# Patient Record
Sex: Female | Born: 1950
Health system: Southern US, Community
[De-identification: ages and names within clinical notes are randomized; demographics above are authoritative.]

## PROBLEM LIST (undated history)

## (undated) DIAGNOSIS — I10 Essential (primary) hypertension: Secondary | ICD-10-CM

## (undated) DIAGNOSIS — D509 Iron deficiency anemia, unspecified: Secondary | ICD-10-CM

## (undated) DIAGNOSIS — E785 Hyperlipidemia, unspecified: Secondary | ICD-10-CM

## (undated) DIAGNOSIS — E119 Type 2 diabetes mellitus without complications: Secondary | ICD-10-CM

## (undated) HISTORY — PX: BACK SURGERY: SHX140

## (undated) HISTORY — DX: Iron deficiency anemia, unspecified: D50.9

## (undated) HISTORY — DX: Essential (primary) hypertension: I10

## (undated) HISTORY — DX: Hyperlipidemia, unspecified: E78.5

## (undated) HISTORY — DX: Type 2 diabetes mellitus without complications: E11.9

## (undated) HISTORY — PX: ABDOMINAL HYSTERECTOMY: SUR658

---

## 2005-02-08 ENCOUNTER — Ambulatory Visit (HOSPITAL_COMMUNITY): Admission: RE | Admit: 2005-02-08 | Discharge: 2005-02-09 | Payer: Self-pay | Admitting: Neurosurgery

## 2005-04-24 ENCOUNTER — Encounter: Admission: RE | Admit: 2005-04-24 | Discharge: 2005-04-24 | Payer: Self-pay | Admitting: Neurosurgery

## 2006-11-16 ENCOUNTER — Ambulatory Visit (HOSPITAL_COMMUNITY): Admission: RE | Admit: 2006-11-16 | Discharge: 2006-11-18 | Payer: Self-pay | Admitting: Orthopaedic Surgery

## 2011-04-04 ENCOUNTER — Ambulatory Visit (HOSPITAL_COMMUNITY)
Admission: RE | Admit: 2011-04-04 | Discharge: 2011-04-04 | Disposition: A | Discharge status: Home / Self Care | Payer: Medicare Other | Source: Ambulatory Visit | Attending: Orthopedic Surgery | Admitting: Orthopedic Surgery

## 2011-04-04 ENCOUNTER — Encounter (HOSPITAL_COMMUNITY)
Admission: RE | Admit: 2011-04-04 | Discharge: 2011-04-04 | Disposition: A | Discharge status: Home / Self Care | Payer: Medicare Other | Source: Ambulatory Visit | Attending: Orthopedic Surgery | Admitting: Orthopedic Surgery

## 2011-04-04 ENCOUNTER — Other Ambulatory Visit (HOSPITAL_COMMUNITY): Payer: Self-pay | Admitting: Orthopedic Surgery

## 2011-04-04 DIAGNOSIS — Z01811 Encounter for preprocedural respiratory examination: Secondary | ICD-10-CM

## 2011-04-04 DIAGNOSIS — M5020 Other cervical disc displacement, unspecified cervical region: Secondary | ICD-10-CM | POA: Insufficient documentation

## 2011-04-04 DIAGNOSIS — Z01818 Encounter for other preprocedural examination: Secondary | ICD-10-CM | POA: Insufficient documentation

## 2011-04-04 DIAGNOSIS — Z01812 Encounter for preprocedural laboratory examination: Secondary | ICD-10-CM | POA: Insufficient documentation

## 2011-04-04 LAB — COMPREHENSIVE METABOLIC PANEL
ALT: 56 U/L — ABNORMAL HIGH (ref 0–35)
AST: 37 U/L (ref 0–37)
Albumin: 4.2 g/dL (ref 3.5–5.2)
Alkaline Phosphatase: 103 U/L (ref 39–117)
BUN: 15 mg/dL (ref 6–23)
CO2: 33 mEq/L — ABNORMAL HIGH (ref 19–32)
Calcium: 10.4 mg/dL (ref 8.4–10.5)
Chloride: 99 mEq/L (ref 96–112)
Creatinine, Ser: 0.54 mg/dL (ref 0.50–1.10)
GFR calc Af Amer: >60 mL/min (ref >60)
GFR calc non Af Amer: >60 mL/min (ref >60)
Glucose, Bld: 87 mg/dL (ref 70–99)
Potassium: 3.9 mEq/L (ref 3.5–5.1)
Sodium: 142 mEq/L (ref 135–145)
Total Bilirubin: 0.4 mg/dL (ref 0.3–1.2)
Total Protein: 7.8 g/dL (ref 6.0–8.3)

## 2011-04-04 LAB — DIFFERENTIAL
Basophils Absolute: 0 10*3/uL (ref 0.0–0.1)
Basophils Relative: 0 % (ref 0–1)
Eosinophils Absolute: 0.3 10*3/uL (ref 0.0–0.7)
Eosinophils Relative: 2 % (ref 0–5)
Lymphocytes Relative: 26 % (ref 12–46)
Lymphs Abs: 4 10*3/uL (ref 0.7–4.0)
Monocytes Absolute: 1.1 10*3/uL — ABNORMAL HIGH (ref 0.1–1.0)
Monocytes Relative: 7 % (ref 3–12)
Neutro Abs: 9.8 10*3/uL — ABNORMAL HIGH (ref 1.7–7.7)
Neutrophils Relative %: 64 % (ref 43–77)

## 2011-04-04 LAB — URINALYSIS, ROUTINE W REFLEX MICROSCOPIC
Bilirubin Urine: NEGATIVE
Glucose, UA: NEGATIVE mg/dL
Hgb urine dipstick: NEGATIVE
Ketones, ur: NEGATIVE mg/dL
Leukocytes, UA: NEGATIVE
Nitrite: NEGATIVE
Protein, ur: NEGATIVE mg/dL
Specific Gravity, Urine: 1.013 (ref 1.005–1.030)
Urobilinogen, UA: 0.2 mg/dL (ref 0.0–1.0)
pH: 5.5 (ref 5.0–8.0)

## 2011-04-04 LAB — PROTIME-INR
INR: 0.98 (ref 0.00–1.49)
Prothrombin Time: 13.2 seconds (ref 11.6–15.2)

## 2011-04-04 LAB — TYPE AND SCREEN
ABO/RH(D): A POS
Antibody Screen: NEGATIVE

## 2011-04-04 LAB — CBC
HCT: 45.7 % (ref 36.0–46.0)
Hemoglobin: 15.4 g/dL — ABNORMAL HIGH (ref 12.0–15.0)
MCH: 30.7 pg (ref 26.0–34.0)
MCHC: 33.7 g/dL (ref 30.0–36.0)
MCV: 91.2 fL (ref 78.0–100.0)
Platelets: 264 10*3/uL (ref 150–400)
RBC: 5.01 MIL/uL (ref 3.87–5.11)
RDW: 14.1 % (ref 11.5–15.5)
WBC: 15.3 10*3/uL — ABNORMAL HIGH (ref 4.0–10.5)

## 2011-04-04 LAB — SURGICAL PCR SCREEN
MRSA, PCR: NEGATIVE
Staphylococcus aureus: NEGATIVE

## 2011-04-04 LAB — APTT: aPTT: 28 seconds (ref 24–37)

## 2011-04-12 ENCOUNTER — Inpatient Hospital Stay (HOSPITAL_COMMUNITY)
Admission: RE | Admit: 2011-04-12 | Discharge: 2011-04-19 | DRG: 471 | Disposition: A | Discharge status: Home Health | Payer: Medicare Other | Source: Ambulatory Visit | Attending: Orthopedic Surgery | Admitting: Orthopedic Surgery

## 2011-04-12 ENCOUNTER — Other Ambulatory Visit: Payer: Self-pay | Admitting: Orthopedic Surgery

## 2011-04-12 ENCOUNTER — Inpatient Hospital Stay (HOSPITAL_COMMUNITY): Payer: Medicare Other

## 2011-04-12 DIAGNOSIS — J189 Pneumonia, unspecified organism: Secondary | ICD-10-CM | POA: Diagnosis not present

## 2011-04-12 DIAGNOSIS — I10 Essential (primary) hypertension: Secondary | ICD-10-CM | POA: Diagnosis present

## 2011-04-12 DIAGNOSIS — Z23 Encounter for immunization: Secondary | ICD-10-CM

## 2011-04-12 DIAGNOSIS — M5020 Other cervical disc displacement, unspecified cervical region: Principal | ICD-10-CM | POA: Diagnosis present

## 2011-04-12 DIAGNOSIS — E119 Type 2 diabetes mellitus without complications: Secondary | ICD-10-CM | POA: Diagnosis present

## 2011-04-12 LAB — DIFFERENTIAL
Basophils Absolute: 0 10*3/uL (ref 0.0–0.1)
Basophils Relative: 0 % (ref 0–1)
Eosinophils Absolute: 0.3 10*3/uL (ref 0.0–0.7)
Eosinophils Relative: 2 % (ref 0–5)
Lymphocytes Relative: 28 % (ref 12–46)
Lymphs Abs: 3.3 10*3/uL (ref 0.7–4.0)
Monocytes Absolute: 0.7 10*3/uL (ref 0.1–1.0)
Monocytes Relative: 6 % (ref 3–12)
Neutro Abs: 7.3 10*3/uL (ref 1.7–7.7)
Neutrophils Relative %: 63 % (ref 43–77)

## 2011-04-12 LAB — CBC
HCT: 42.4 % (ref 36.0–46.0)
Hemoglobin: 14.4 g/dL (ref 12.0–15.0)
MCH: 30.8 pg (ref 26.0–34.0)
MCHC: 34 g/dL (ref 30.0–36.0)
MCV: 90.6 fL (ref 78.0–100.0)
Platelets: 259 10*3/uL (ref 150–400)
RBC: 4.68 MIL/uL (ref 3.87–5.11)
RDW: 13.9 % (ref 11.5–15.5)
WBC: 11.6 10*3/uL — ABNORMAL HIGH (ref 4.0–10.5)

## 2011-04-12 LAB — GLUCOSE, CAPILLARY
Glucose-Capillary: 152 mg/dL — ABNORMAL HIGH (ref 70–99)
Glucose-Capillary: 125 mg/dL — ABNORMAL HIGH (ref 70–99)

## 2011-04-13 LAB — GLUCOSE, CAPILLARY
Glucose-Capillary: 142 mg/dL — ABNORMAL HIGH (ref 70–99)
Glucose-Capillary: 168 mg/dL — ABNORMAL HIGH (ref 70–99)
Glucose-Capillary: 155 mg/dL — ABNORMAL HIGH (ref 70–99)
Glucose-Capillary: 201 mg/dL — ABNORMAL HIGH (ref 70–99)
Glucose-Capillary: 137 mg/dL — ABNORMAL HIGH (ref 70–99)

## 2011-04-13 NOTE — Op Note (Signed)
[name], [name] NO.:  [id number]  MEDICAL RECORD NO.:  [id number]  LOCATION:  3035                         FACILITY:  [facility]  PHYSICIAN:  Nelda Severe, MD      DATE OF BIRTH:  1950-10-28  DATE OF PROCEDURE:  04/12/2011 DATE OF DISCHARGE:                              OPERATIVE REPORT   SURGEON:  Nelda Severe, MD  ASSISTANT:  Lianne Cure, PA-C  PREOPERATIVE DIAGNOSES:  Central disk herniation with spinal stenosis C4- 5, status post prior C5-T7 fusion.  POSTOPERATIVE DIAGNOSES:  Central disk herniation with spinal stenosis C4-5, status post prior C5-T7 fusion.  OPERATIVE PROCEDURE:  Anterior cervical disk excision, decompression of spinal canal to dura, C4-5 interbody fusion with autogenous graft and K2M cage/plate device [facility]); harvest left anterior iliac crest autograft.  OPERATIVE NOTE:  The patient was placed under general endotracheal anesthesia.  The arms were placed, abducted at the patient's side resting on arm boards parallel to the table.  A foam bump was placed under the left buttock to raise the left iliac crest to facilitate graft harvest.  A shoulder towel was placed under the shoulders to aid in extension of the neck and head supported on foam.  A cross-table lateral radiograph of the cervical spine was taken with a spinal needle taped to the skin to aid in determining the correct level to make the incision. Prior to taking the x-ray, wrist bracelets were attached for purposes of retracting on the upper extremities to depress the shoulders during the x-ray.  Once the cross-table lateral radiograph had been taken, the needle was removed and the skin marker used to mark the level of the needle prior to removing the needle.  The left anterior iliac crest area and anterior cervical area were then prepped with DuraPrep and draped in square fashion.  The drapes were secured with Ioban.  At this point, a time-out was held at  which point the patient's identity, preoperative diagnosis, intended procedure, anticipated blood loss, length of procedure, antibiotic infusion status, and health status were all discussed/confirmed.  We then made a left-sided anterior cervical incision from the midline laterally to the anterior border of the sternocleidomastoid at approximately the C4-5 level.  There was practically no platysma identifiable.  One jugular tributary was bipolar coagulated and divided. The deep cervical fascia was bluntly dissected down onto the anterior spinal column, retracting the carotid sheath laterally and the trachea and esophagus to the right side.  The longus coli muscles were identified in the interval between them.  Cutting current was used to cut down on the upper end of the previously placed plate.  Immediately supra adjacent disk was identified and a 18 gauge needle was placed and a cross-table lateral radiograph was taken to confirm position.  The longus coli muscles were mobilized bilaterally and a shadow line retractor was placed.  We then placed a Caspar pin distally through a central hole of the plate into the body of C5 and a Caspar pin into the body of C4 proximally and applied distraction.  We then enucleated the disk using a combination of curettes and rongeurs.  We eventually used a high-speed  bur to remove some of the anterior inferior lip of the C4 vertebra to aid in exposure.  Disk material was curetted away back to the posterior margins of the vertebrae and the posterior longitudinal ligament.  There was a small rent in the posterior longitudinal ligament.  The ligament was removed.  A 2-mm Kerrison and then several fragments of degenerated nucleus material were delivered into the disk space from the spinal canal, beyond the confines of the posterior longitudinal ligament.  Once this had been done, the dura became visible.  Bilateral decompression was carried out into the  foramina and nerve hook was passed into the neural foramen on either side which detected no further compression.  Meticulous care was taken to curette all endplate cartilage away.  The disk space was sized to an 8-mm lordotic [place] implant incorporating screw fixation.  We harvested a left anterior iliac crest graft through a short incision made about 5 cm posterior to the anterior superior iliac spine and carried down onto the iliac crest using cutting current.  Retractors were placed medially and laterally to the crest.  The superior surface the crest was denuded of soft tissue.  Special trocar was used to perforate the superior surface of the crest and then a graft retaining disposable harvesting device used to remove several milliliters of bone graft.  We then placed piece of dry Gelfoam in the hole which we had created in the bone to control bleeding.  The appropriate K2M/[place] implant was packed with bone graft and gently impacted into place so that it was flushed with the anterior surface of the superior vertebra, that is C4.  Angled screws were then inserted into the vertebra above, C4 and a single screw placed distally into the C5 vertebra.  Cross-table lateral radiograph was taken showed satisfactory position of the implants.  Use of this implant precluded the necessity of removing the previously placed C5-C7 plate and screws.  A 7-gauge Silastic drain was placed in the prevertebral space and brought out through the skin to the medial end of the wound.  It was sutured in place using 3-0 nylon suture.  The subcutaneous layer and what platysma was identifiable was closed using interrupted inverted 2-0 undyed Vicryl sutures.  The skin was closed with subcuticular 3-0 undyed Vicryl in running fashion.  The skin was reinforced with Dermabond.  The bone graft harvest wound subcutaneous layer was closed using interrupted inverted 2-0 undyed Vicryl sutures.  The skin was  closed using subcuticular 3-0 undyed Vicryl in running fashion.  Skin edges were reinforced with Dermabond.  Not mentioned above is the fact that all screws were torqued with a torque screwdriver.  The dressings were then applied.  The patient transferred to bed and awakened and brought to the recovery room where she is awake and moved all limbs but is very drowsy.  There were no intraoperative complications.  The blood loss was about 100 mL secondary to some epidural bleeding which was easily controlled with FloSeal.  Sponge and needle counts were correct.  There were no intraoperative complications.     Nelda Severe, MD     MT/MEDQ  D:  04/12/2011  T:  04/13/2011  Job:  [id number]  Electronically Signed by Nelda Severe MD on 04/13/2011 02:05:39 PM

## 2011-04-14 ENCOUNTER — Inpatient Hospital Stay (HOSPITAL_COMMUNITY): Payer: Medicare Other

## 2011-04-14 LAB — DIFFERENTIAL
Eosinophils Relative: 0 % (ref 0–5)
Lymphocytes Relative: 7 % — ABNORMAL LOW (ref 12–46)
Lymphs Abs: 1.7 10*3/uL (ref 0.7–4.0)
Monocytes Relative: 4 % (ref 3–12)

## 2011-04-14 LAB — COMPREHENSIVE METABOLIC PANEL
BUN: 5 mg/dL — ABNORMAL LOW (ref 6–23)
CO2: 33 mEq/L — ABNORMAL HIGH (ref 19–32)
Chloride: 96 mEq/L (ref 96–112)
Creatinine, Ser: <0.47 mg/dL — ABNORMAL LOW (ref 0.50–1.10)
Total Bilirubin: 1 mg/dL (ref 0.3–1.2)

## 2011-04-14 LAB — GLUCOSE, CAPILLARY
Glucose-Capillary: 172 mg/dL — ABNORMAL HIGH (ref 70–99)
Glucose-Capillary: 119 mg/dL — ABNORMAL HIGH (ref 70–99)
Glucose-Capillary: 144 mg/dL — ABNORMAL HIGH (ref 70–99)

## 2011-04-14 LAB — URINALYSIS, ROUTINE W REFLEX MICROSCOPIC
Protein, ur: NEGATIVE mg/dL
Urobilinogen, UA: 0.2 mg/dL (ref 0.0–1.0)

## 2011-04-14 LAB — CBC
HCT: 40.8 % (ref 36.0–46.0)
MCV: 93.6 fL (ref 78.0–100.0)
RBC: 4.36 MIL/uL (ref 3.87–5.11)
RDW: 14 % (ref 11.5–15.5)
WBC: 24.9 10*3/uL — ABNORMAL HIGH (ref 4.0–10.5)

## 2011-04-14 LAB — URINE MICROSCOPIC-ADD ON

## 2011-04-15 LAB — DIFFERENTIAL
Basophils Absolute: 0 10*3/uL (ref 0.0–0.1)
Lymphocytes Relative: 15 % (ref 12–46)
Monocytes Absolute: 1 10*3/uL (ref 0.1–1.0)
Monocytes Relative: 5 % (ref 3–12)
Neutro Abs: 15.6 10*3/uL — ABNORMAL HIGH (ref 1.7–7.7)

## 2011-04-15 LAB — CBC
MCH: 30.4 pg (ref 26.0–34.0)
MCV: 94.4 fL (ref 78.0–100.0)
Platelets: 221 10*3/uL (ref 150–400)
RBC: 3.92 MIL/uL (ref 3.87–5.11)
RDW: 14.4 % (ref 11.5–15.5)

## 2011-04-15 LAB — GLUCOSE, CAPILLARY
Glucose-Capillary: 110 mg/dL — ABNORMAL HIGH (ref 70–99)
Glucose-Capillary: 87 mg/dL (ref 70–99)

## 2011-04-16 ENCOUNTER — Inpatient Hospital Stay (HOSPITAL_COMMUNITY): Payer: Medicare Other

## 2011-04-16 LAB — CBC
HCT: 35.2 % — ABNORMAL LOW (ref 36.0–46.0)
Hemoglobin: 11.2 g/dL — ABNORMAL LOW (ref 12.0–15.0)
MCH: 30.3 pg (ref 26.0–34.0)
MCHC: 31.8 g/dL (ref 30.0–36.0)
RBC: 3.7 MIL/uL — ABNORMAL LOW (ref 3.87–5.11)

## 2011-04-16 LAB — DIFFERENTIAL
Basophils Absolute: 0 10*3/uL (ref 0.0–0.1)
Lymphocytes Relative: 17 % (ref 12–46)
Monocytes Absolute: 0.7 10*3/uL (ref 0.1–1.0)
Monocytes Relative: 6 % (ref 3–12)
Neutro Abs: 9.1 10*3/uL — ABNORMAL HIGH (ref 1.7–7.7)
Neutrophils Relative %: 75 % (ref 43–77)

## 2011-04-16 LAB — GLUCOSE, CAPILLARY
Glucose-Capillary: 195 mg/dL — ABNORMAL HIGH (ref 70–99)
Glucose-Capillary: 142 mg/dL — ABNORMAL HIGH (ref 70–99)

## 2011-04-16 MED ORDER — IOHEXOL 300 MG/ML  SOLN: 80.0000 mL | Freq: Once | INTRAMUSCULAR | Status: AC | PRN

## 2011-04-17 LAB — GLUCOSE, CAPILLARY
Glucose-Capillary: 134 mg/dL — ABNORMAL HIGH (ref 70–99)
Glucose-Capillary: 148 mg/dL — ABNORMAL HIGH (ref 70–99)
Glucose-Capillary: 124 mg/dL — ABNORMAL HIGH (ref 70–99)
Glucose-Capillary: 187 mg/dL — ABNORMAL HIGH (ref 70–99)

## 2011-04-17 LAB — BASIC METABOLIC PANEL
CO2: 36 mEq/L — ABNORMAL HIGH (ref 19–32)
Chloride: 99 mEq/L (ref 96–112)
Glucose, Bld: 132 mg/dL — ABNORMAL HIGH (ref 70–99)
Potassium: 3.3 mEq/L — ABNORMAL LOW (ref 3.5–5.1)
Sodium: 142 mEq/L (ref 135–145)

## 2011-04-17 LAB — CBC
HCT: 36.7 % (ref 36.0–46.0)
Hemoglobin: 11.8 g/dL — ABNORMAL LOW (ref 12.0–15.0)
MCHC: 32.2 g/dL (ref 30.0–36.0)
RBC: 3.9 MIL/uL (ref 3.87–5.11)

## 2011-04-18 LAB — BASIC METABOLIC PANEL
BUN: 8 mg/dL (ref 6–23)
Calcium: 9.4 mg/dL (ref 8.4–10.5)
Chloride: 102 mEq/L (ref 96–112)
Creatinine, Ser: <0.47 mg/dL — ABNORMAL LOW (ref 0.50–1.10)

## 2011-04-18 LAB — GLUCOSE, CAPILLARY
Glucose-Capillary: 138 mg/dL — ABNORMAL HIGH (ref 70–99)
Glucose-Capillary: 183 mg/dL — ABNORMAL HIGH (ref 70–99)

## 2011-04-19 ENCOUNTER — Inpatient Hospital Stay (HOSPITAL_COMMUNITY): Payer: Medicare Other

## 2011-04-19 LAB — GLUCOSE, CAPILLARY: Glucose-Capillary: 195 mg/dL — ABNORMAL HIGH (ref 70–99)

## 2011-04-21 LAB — CULTURE, BLOOD (ROUTINE X 2)
Culture: NO GROWTH
Culture: NO GROWTH

## 2011-05-06 NOTE — Discharge Summary (Signed)
  NAMETAILEY, TOP NO.:  [id number]  MEDICAL RECORD NO.:  [id number]  LOCATION:  3035                         FACILITY:  [facility]  PHYSICIAN:  Nelda Severe, MD      DATE OF BIRTH:  23-Oct-1950  DATE OF ADMISSION:  04/12/2011 DATE OF DISCHARGE:                              DISCHARGE SUMMARY   ADDENDUM:  The patient had difficulty with pain control and painful swallowing.  I added 12 mcg fentanyl patch q.72 hour to her pain medication regimen.  Prescription was given to the patient and this is to be used q.72, a count of 5 patches with Percocet 5 for breakthrough pain.  Cepacol lozenges were also ordered for her sore throat and plan is to DC home today when the patient is stable and more alert.  She is taking p.o.'s well at bedside.  She was eating eggs slowly but independently prior to discharge today.  She may shower.  No dressing is needed at this point.  Incisions were clean dry and intact.DIAGNOSIS:  C4-5 herniated disk.     Lianne Cure, P.A.   ______________________________ Nelda Severe, MD    MC/MEDQ  D:  04/14/2011  T:  04/14/2011  Job:  [id number]  Electronically Signed by Lianne Cure P.A. on 05/02/2011 08:32:21 AM Electronically Signed by Nelda Severe MD on 05/06/2011 02:14:45 PM

## 2011-05-06 NOTE — Discharge Summary (Signed)
  NAMERAYEN, PALEN NO.:  [id number]  MEDICAL RECORD NO.:  [id number]  LOCATION:  3035                         FACILITY:  [facility]  PHYSICIAN:  Nelda Severe, MD      DATE OF BIRTH:  17-Sep-1950  DATE OF ADMISSION:  04/12/2011 DATE OF DISCHARGE:                              DISCHARGE SUMMARY   She is under the care of Dr. Nelda Severe.  FINAL DIAGNOSIS:  C4-5 herniated disk status post C5-C7 fusion.  BRIEF HISTORY OF STAY:  She was brought to [facility] on April 12, 2011.  She underwent ACDF at C4-5 with iliac crest bone graft harvest by Dr. Alveda Reasons, assistant - Lianne Cure, PA-C.  No significant findings.  Estimated blood loss 100 mL.  Grossly neurovascularly and motor intact.  Today, she is afebrile.  Vital signs were stable.  Drain was discontinued.  Incisions are clean, dry, and intact at both cervical and the left iliac.  She has grossly neurovascularly and motor intact bilateral upper and lower extremities. She is having painful swallowing.  She has been out of bed several times to go to the restroom.  No difficulties with walking per the patient. Clean, dry dressing was applied to the cervical.  Clean dry dressing is on the hip.  ASSESSMENT/FINAL DIAGNOSIS:  Herniated disk, C4-5.  PLAN:  She will walk for exercise.  No bending, stooping.  No lifting greater than 5 pounds.  She does have a soft cervical collar for comfort only, she does not have to wear the cervical collar.  I am sending her home with Percocet 5/325 one to two q.4 h. p.r.n. for pain control.  She will follow up in our office in approximately 1 week.  We will hep-lock her IV, and when she is stable today, she will be discharged home and her disposition is stable.  Diet is soft for the next 6 days and then she can progress to regular.  She will follow up in 1 week.     Lianne Cure, P.A.   ______________________________ Nelda Severe, MD    MC/MEDQ  D:   04/13/2011  T:  04/13/2011  Job:  [id number]  Electronically Signed by Lianne Cure P.A. on 05/02/2011 08:32:06 AM Electronically Signed by Nelda Severe MD on 05/06/2011 02:14:40 PM

## 2011-05-06 NOTE — Discharge Summary (Signed)
  NAMEFELMA, [name] NO.:  [id number]  MEDICAL RECORD NO.:  [id number]  LOCATION:  3035                         FACILITY:  [facility]  PHYSICIAN:  Nelda Severe, MD      DATE OF BIRTH:  February 21, 1951  DATE OF ADMISSION:  04/12/2011 DATE OF DISCHARGE:  04/19/2011                              DISCHARGE SUMMARY   ADDENDUM:  She was ready to be discharged home on April 14, 2011, and had sudden onset of weakness, lethargy, difficulty swallowing.  We did consult hospitalist who ordered discontinuation of narcotic pain medications those of Narcan for reversal.  We also ordered a chest x-ray due to bloody sputum and she was then diagnosed with right middle lobe pneumonia after review of the chest x-ray.  She was less lethargic after removing fentanyl patch and p.o. Percocet.  She was afebrile.  The patient was started on IV antibiotics.  She was followed from April 14, 2011, until today April 19, 2011, on IV antibiotics.  She made excellent recovery.  She is now able to swallow independently, taking p.o.'s, drinking fluids.  Her throat is less sore.  She is no longer spitting up bloody sputum.  We are going to be discharging her today on Avelox 400 mg p.o. daily.  She will follow up with her primary care doctor as an outpatient for followup chest x-ray.  She is stable.  Her diet is regular at this point.  DIAGNOSIS:  Cervical C4-5 herniated disk.  SECONDARY DIAGNOSIS:  Right middle lobe pneumonia.  PLAN:  At this point, she will walk for exercise.  No bending, stooping, lifting more than 5 pounds.  She will follow up in Dr. Casimiro Needle Cheyan Frees's office in 1 week.  She has been sent home on Percocet 5/325 one to two q.4 p.r.n. for pain control, and she is going to follow up with her primary care doctor in 1 week for the right middle lobe pneumonia.  If she has troubles or concerns with her neck or hip incision, she is going to call our office.  If she has  difficulties with breathing, chest pain, she is to call her medical doctor.  We will get her rolling walker and home PT, and she is to use ice on the hip or anterior cervical incision as needed.     Lianne Cure, P.A.   ______________________________ Nelda Severe, MD    MC/MEDQ  D:  04/19/2011  T:  04/19/2011  Job:  [id number]  Electronically Signed by Lianne Cure P.A. on 05/02/2011 08:32:13 AM Electronically Signed by Nelda Severe MD on 05/06/2011 02:14:43 PM

## 2011-08-01 DIAGNOSIS — A088 Other specified intestinal infections: Secondary | ICD-10-CM | POA: Diagnosis not present

## 2011-08-01 DIAGNOSIS — R112 Nausea with vomiting, unspecified: Secondary | ICD-10-CM | POA: Diagnosis not present

## 2011-08-09 DIAGNOSIS — M5430 Sciatica, unspecified side: Secondary | ICD-10-CM | POA: Diagnosis not present

## 2011-08-09 DIAGNOSIS — M999 Biomechanical lesion, unspecified: Secondary | ICD-10-CM | POA: Diagnosis not present

## 2011-08-09 DIAGNOSIS — M5137 Other intervertebral disc degeneration, lumbosacral region: Secondary | ICD-10-CM | POA: Diagnosis not present

## 2011-08-12 DIAGNOSIS — M5020 Other cervical disc displacement, unspecified cervical region: Secondary | ICD-10-CM | POA: Diagnosis not present

## 2011-08-12 DIAGNOSIS — Z981 Arthrodesis status: Secondary | ICD-10-CM | POA: Diagnosis not present

## 2011-08-13 DIAGNOSIS — R1084 Generalized abdominal pain: Secondary | ICD-10-CM | POA: Diagnosis not present

## 2011-08-13 DIAGNOSIS — R112 Nausea with vomiting, unspecified: Secondary | ICD-10-CM | POA: Diagnosis not present

## 2011-08-15 DIAGNOSIS — K7689 Other specified diseases of liver: Secondary | ICD-10-CM | POA: Diagnosis not present

## 2011-08-15 DIAGNOSIS — R52 Pain, unspecified: Secondary | ICD-10-CM | POA: Diagnosis not present

## 2011-08-15 DIAGNOSIS — R109 Unspecified abdominal pain: Secondary | ICD-10-CM | POA: Diagnosis not present

## 2011-08-15 DIAGNOSIS — R112 Nausea with vomiting, unspecified: Secondary | ICD-10-CM | POA: Diagnosis not present

## 2011-08-17 DIAGNOSIS — M542 Cervicalgia: Secondary | ICD-10-CM | POA: Diagnosis not present

## 2011-08-17 DIAGNOSIS — M961 Postlaminectomy syndrome, not elsewhere classified: Secondary | ICD-10-CM | POA: Diagnosis not present

## 2011-08-17 DIAGNOSIS — M5030 Other cervical disc degeneration, unspecified cervical region: Secondary | ICD-10-CM | POA: Diagnosis not present

## 2011-08-18 DIAGNOSIS — M5137 Other intervertebral disc degeneration, lumbosacral region: Secondary | ICD-10-CM | POA: Diagnosis not present

## 2011-08-18 DIAGNOSIS — M5430 Sciatica, unspecified side: Secondary | ICD-10-CM | POA: Diagnosis not present

## 2011-08-18 DIAGNOSIS — M999 Biomechanical lesion, unspecified: Secondary | ICD-10-CM | POA: Diagnosis not present

## 2011-08-22 ENCOUNTER — Other Ambulatory Visit: Payer: Self-pay | Admitting: Orthopaedic Surgery

## 2011-08-22 DIAGNOSIS — M542 Cervicalgia: Secondary | ICD-10-CM

## 2011-08-23 DIAGNOSIS — R1031 Right lower quadrant pain: Secondary | ICD-10-CM | POA: Diagnosis not present

## 2011-08-23 DIAGNOSIS — R112 Nausea with vomiting, unspecified: Secondary | ICD-10-CM | POA: Diagnosis not present

## 2011-08-23 DIAGNOSIS — R1013 Epigastric pain: Secondary | ICD-10-CM | POA: Diagnosis not present

## 2011-08-24 DIAGNOSIS — M5020 Other cervical disc displacement, unspecified cervical region: Secondary | ICD-10-CM | POA: Diagnosis not present

## 2011-08-24 DIAGNOSIS — M542 Cervicalgia: Secondary | ICD-10-CM | POA: Diagnosis not present

## 2011-08-25 DIAGNOSIS — R1013 Epigastric pain: Secondary | ICD-10-CM | POA: Diagnosis not present

## 2011-08-25 DIAGNOSIS — M818 Other osteoporosis without current pathological fracture: Secondary | ICD-10-CM | POA: Diagnosis not present

## 2011-08-25 DIAGNOSIS — F411 Generalized anxiety disorder: Secondary | ICD-10-CM | POA: Diagnosis not present

## 2011-08-25 DIAGNOSIS — K2990 Gastroduodenitis, unspecified, without bleeding: Secondary | ICD-10-CM | POA: Diagnosis not present

## 2011-08-25 DIAGNOSIS — E785 Hyperlipidemia, unspecified: Secondary | ICD-10-CM | POA: Diagnosis not present

## 2011-08-25 DIAGNOSIS — J309 Allergic rhinitis, unspecified: Secondary | ICD-10-CM | POA: Diagnosis not present

## 2011-08-25 DIAGNOSIS — Z79899 Other long term (current) drug therapy: Secondary | ICD-10-CM | POA: Diagnosis not present

## 2011-08-25 DIAGNOSIS — J449 Chronic obstructive pulmonary disease, unspecified: Secondary | ICD-10-CM | POA: Diagnosis not present

## 2011-08-25 DIAGNOSIS — K209 Esophagitis, unspecified without bleeding: Secondary | ICD-10-CM | POA: Diagnosis not present

## 2011-08-25 DIAGNOSIS — K219 Gastro-esophageal reflux disease without esophagitis: Secondary | ICD-10-CM | POA: Diagnosis not present

## 2011-08-25 DIAGNOSIS — K208 Other esophagitis without bleeding: Secondary | ICD-10-CM | POA: Diagnosis not present

## 2011-08-25 DIAGNOSIS — R112 Nausea with vomiting, unspecified: Secondary | ICD-10-CM | POA: Diagnosis not present

## 2011-08-25 DIAGNOSIS — Z794 Long term (current) use of insulin: Secondary | ICD-10-CM | POA: Diagnosis not present

## 2011-08-25 DIAGNOSIS — K2960 Other gastritis without bleeding: Secondary | ICD-10-CM | POA: Diagnosis not present

## 2011-08-25 DIAGNOSIS — K2970 Gastritis, unspecified, without bleeding: Secondary | ICD-10-CM | POA: Diagnosis not present

## 2011-08-25 DIAGNOSIS — E119 Type 2 diabetes mellitus without complications: Secondary | ICD-10-CM | POA: Diagnosis not present

## 2011-08-26 DIAGNOSIS — M5000 Cervical disc disorder with myelopathy, unspecified cervical region: Secondary | ICD-10-CM | POA: Diagnosis not present

## 2011-08-26 DIAGNOSIS — M542 Cervicalgia: Secondary | ICD-10-CM | POA: Diagnosis not present

## 2011-08-26 DIAGNOSIS — M5106 Intervertebral disc disorders with myelopathy, lumbar region: Secondary | ICD-10-CM | POA: Diagnosis not present

## 2011-09-01 DIAGNOSIS — M5137 Other intervertebral disc degeneration, lumbosacral region: Secondary | ICD-10-CM | POA: Diagnosis not present

## 2011-09-01 DIAGNOSIS — M5430 Sciatica, unspecified side: Secondary | ICD-10-CM | POA: Diagnosis not present

## 2011-09-01 DIAGNOSIS — M999 Biomechanical lesion, unspecified: Secondary | ICD-10-CM | POA: Diagnosis not present

## 2011-09-13 DIAGNOSIS — M5430 Sciatica, unspecified side: Secondary | ICD-10-CM | POA: Diagnosis not present

## 2011-09-13 DIAGNOSIS — M999 Biomechanical lesion, unspecified: Secondary | ICD-10-CM | POA: Diagnosis not present

## 2011-09-13 DIAGNOSIS — M5137 Other intervertebral disc degeneration, lumbosacral region: Secondary | ICD-10-CM | POA: Diagnosis not present

## 2011-09-22 DIAGNOSIS — Z79899 Other long term (current) drug therapy: Secondary | ICD-10-CM | POA: Diagnosis not present

## 2011-09-22 DIAGNOSIS — E785 Hyperlipidemia, unspecified: Secondary | ICD-10-CM | POA: Diagnosis not present

## 2011-09-22 DIAGNOSIS — IMO0001 Reserved for inherently not codable concepts without codable children: Secondary | ICD-10-CM | POA: Diagnosis not present

## 2011-09-22 DIAGNOSIS — I10 Essential (primary) hypertension: Secondary | ICD-10-CM | POA: Diagnosis not present

## 2011-09-22 DIAGNOSIS — Z6831 Body mass index (BMI) 31.0-31.9, adult: Secondary | ICD-10-CM | POA: Diagnosis not present

## 2011-09-27 DIAGNOSIS — M5430 Sciatica, unspecified side: Secondary | ICD-10-CM | POA: Diagnosis not present

## 2011-09-27 DIAGNOSIS — M999 Biomechanical lesion, unspecified: Secondary | ICD-10-CM | POA: Diagnosis not present

## 2011-09-27 DIAGNOSIS — M5137 Other intervertebral disc degeneration, lumbosacral region: Secondary | ICD-10-CM | POA: Diagnosis not present

## 2011-09-29 DIAGNOSIS — K565 Intestinal adhesions [bands], unspecified as to partial versus complete obstruction: Secondary | ICD-10-CM | POA: Diagnosis not present

## 2011-09-29 DIAGNOSIS — R1013 Epigastric pain: Secondary | ICD-10-CM | POA: Diagnosis not present

## 2011-10-03 DIAGNOSIS — R1013 Epigastric pain: Secondary | ICD-10-CM | POA: Diagnosis not present

## 2011-10-03 DIAGNOSIS — K219 Gastro-esophageal reflux disease without esophagitis: Secondary | ICD-10-CM | POA: Diagnosis not present

## 2011-10-05 DIAGNOSIS — L570 Actinic keratosis: Secondary | ICD-10-CM | POA: Diagnosis not present

## 2011-10-06 DIAGNOSIS — M5106 Intervertebral disc disorders with myelopathy, lumbar region: Secondary | ICD-10-CM | POA: Diagnosis not present

## 2011-10-06 DIAGNOSIS — M5000 Cervical disc disorder with myelopathy, unspecified cervical region: Secondary | ICD-10-CM | POA: Diagnosis not present

## 2011-10-11 DIAGNOSIS — M999 Biomechanical lesion, unspecified: Secondary | ICD-10-CM | POA: Diagnosis not present

## 2011-10-11 DIAGNOSIS — M5137 Other intervertebral disc degeneration, lumbosacral region: Secondary | ICD-10-CM | POA: Diagnosis not present

## 2011-10-11 DIAGNOSIS — M5430 Sciatica, unspecified side: Secondary | ICD-10-CM | POA: Diagnosis not present

## 2011-10-18 DIAGNOSIS — M999 Biomechanical lesion, unspecified: Secondary | ICD-10-CM | POA: Diagnosis not present

## 2011-10-18 DIAGNOSIS — M5137 Other intervertebral disc degeneration, lumbosacral region: Secondary | ICD-10-CM | POA: Diagnosis not present

## 2011-10-18 DIAGNOSIS — M5430 Sciatica, unspecified side: Secondary | ICD-10-CM | POA: Diagnosis not present

## 2011-10-26 DIAGNOSIS — J209 Acute bronchitis, unspecified: Secondary | ICD-10-CM | POA: Diagnosis not present

## 2011-10-26 DIAGNOSIS — Z6831 Body mass index (BMI) 31.0-31.9, adult: Secondary | ICD-10-CM | POA: Diagnosis not present

## 2011-10-26 DIAGNOSIS — I10 Essential (primary) hypertension: Secondary | ICD-10-CM | POA: Diagnosis not present

## 2011-11-08 DIAGNOSIS — M5137 Other intervertebral disc degeneration, lumbosacral region: Secondary | ICD-10-CM | POA: Diagnosis not present

## 2011-11-08 DIAGNOSIS — M5430 Sciatica, unspecified side: Secondary | ICD-10-CM | POA: Diagnosis not present

## 2011-11-08 DIAGNOSIS — M999 Biomechanical lesion, unspecified: Secondary | ICD-10-CM | POA: Diagnosis not present

## 2011-11-22 DIAGNOSIS — L570 Actinic keratosis: Secondary | ICD-10-CM | POA: Diagnosis not present

## 2011-11-24 DIAGNOSIS — M5430 Sciatica, unspecified side: Secondary | ICD-10-CM | POA: Diagnosis not present

## 2011-11-24 DIAGNOSIS — M999 Biomechanical lesion, unspecified: Secondary | ICD-10-CM | POA: Diagnosis not present

## 2011-11-24 DIAGNOSIS — M5137 Other intervertebral disc degeneration, lumbosacral region: Secondary | ICD-10-CM | POA: Diagnosis not present

## 2011-12-07 DIAGNOSIS — M542 Cervicalgia: Secondary | ICD-10-CM | POA: Diagnosis not present

## 2011-12-07 DIAGNOSIS — M5000 Cervical disc disorder with myelopathy, unspecified cervical region: Secondary | ICD-10-CM | POA: Diagnosis not present

## 2011-12-15 DIAGNOSIS — Z6831 Body mass index (BMI) 31.0-31.9, adult: Secondary | ICD-10-CM | POA: Diagnosis not present

## 2011-12-15 DIAGNOSIS — IMO0001 Reserved for inherently not codable concepts without codable children: Secondary | ICD-10-CM | POA: Diagnosis not present

## 2011-12-15 DIAGNOSIS — E559 Vitamin D deficiency, unspecified: Secondary | ICD-10-CM | POA: Diagnosis not present

## 2011-12-15 DIAGNOSIS — I10 Essential (primary) hypertension: Secondary | ICD-10-CM | POA: Diagnosis not present

## 2011-12-15 DIAGNOSIS — E785 Hyperlipidemia, unspecified: Secondary | ICD-10-CM | POA: Diagnosis not present

## 2011-12-20 DIAGNOSIS — M47812 Spondylosis without myelopathy or radiculopathy, cervical region: Secondary | ICD-10-CM | POA: Diagnosis not present

## 2011-12-20 DIAGNOSIS — M542 Cervicalgia: Secondary | ICD-10-CM | POA: Diagnosis not present

## 2012-01-05 DIAGNOSIS — M999 Biomechanical lesion, unspecified: Secondary | ICD-10-CM | POA: Diagnosis not present

## 2012-01-05 DIAGNOSIS — M5430 Sciatica, unspecified side: Secondary | ICD-10-CM | POA: Diagnosis not present

## 2012-01-05 DIAGNOSIS — M5137 Other intervertebral disc degeneration, lumbosacral region: Secondary | ICD-10-CM | POA: Diagnosis not present

## 2012-01-19 DIAGNOSIS — M47812 Spondylosis without myelopathy or radiculopathy, cervical region: Secondary | ICD-10-CM | POA: Diagnosis not present

## 2012-01-19 DIAGNOSIS — M542 Cervicalgia: Secondary | ICD-10-CM | POA: Diagnosis not present

## 2012-02-07 DIAGNOSIS — M5430 Sciatica, unspecified side: Secondary | ICD-10-CM | POA: Diagnosis not present

## 2012-02-07 DIAGNOSIS — M5137 Other intervertebral disc degeneration, lumbosacral region: Secondary | ICD-10-CM | POA: Diagnosis not present

## 2012-02-07 DIAGNOSIS — M999 Biomechanical lesion, unspecified: Secondary | ICD-10-CM | POA: Diagnosis not present

## 2012-02-14 DIAGNOSIS — M542 Cervicalgia: Secondary | ICD-10-CM | POA: Diagnosis not present

## 2012-02-14 DIAGNOSIS — M47812 Spondylosis without myelopathy or radiculopathy, cervical region: Secondary | ICD-10-CM | POA: Diagnosis not present

## 2012-02-20 DIAGNOSIS — M47812 Spondylosis without myelopathy or radiculopathy, cervical region: Secondary | ICD-10-CM | POA: Diagnosis not present

## 2012-02-20 DIAGNOSIS — M542 Cervicalgia: Secondary | ICD-10-CM | POA: Diagnosis not present

## 2012-02-28 DIAGNOSIS — M999 Biomechanical lesion, unspecified: Secondary | ICD-10-CM | POA: Diagnosis not present

## 2012-02-28 DIAGNOSIS — M5430 Sciatica, unspecified side: Secondary | ICD-10-CM | POA: Diagnosis not present

## 2012-02-28 DIAGNOSIS — M5137 Other intervertebral disc degeneration, lumbosacral region: Secondary | ICD-10-CM | POA: Diagnosis not present

## 2012-03-08 DIAGNOSIS — M5430 Sciatica, unspecified side: Secondary | ICD-10-CM | POA: Diagnosis not present

## 2012-03-08 DIAGNOSIS — M5137 Other intervertebral disc degeneration, lumbosacral region: Secondary | ICD-10-CM | POA: Diagnosis not present

## 2012-03-08 DIAGNOSIS — M999 Biomechanical lesion, unspecified: Secondary | ICD-10-CM | POA: Diagnosis not present

## 2012-03-09 DIAGNOSIS — M47812 Spondylosis without myelopathy or radiculopathy, cervical region: Secondary | ICD-10-CM | POA: Diagnosis not present

## 2012-03-09 DIAGNOSIS — M5137 Other intervertebral disc degeneration, lumbosacral region: Secondary | ICD-10-CM | POA: Diagnosis not present

## 2012-03-20 DIAGNOSIS — M999 Biomechanical lesion, unspecified: Secondary | ICD-10-CM | POA: Diagnosis not present

## 2012-03-20 DIAGNOSIS — M5137 Other intervertebral disc degeneration, lumbosacral region: Secondary | ICD-10-CM | POA: Diagnosis not present

## 2012-03-20 DIAGNOSIS — M5430 Sciatica, unspecified side: Secondary | ICD-10-CM | POA: Diagnosis not present

## 2012-03-28 DIAGNOSIS — E785 Hyperlipidemia, unspecified: Secondary | ICD-10-CM | POA: Diagnosis not present

## 2012-03-28 DIAGNOSIS — R195 Other fecal abnormalities: Secondary | ICD-10-CM | POA: Diagnosis not present

## 2012-03-28 DIAGNOSIS — IMO0001 Reserved for inherently not codable concepts without codable children: Secondary | ICD-10-CM | POA: Diagnosis not present

## 2012-03-28 DIAGNOSIS — Z6831 Body mass index (BMI) 31.0-31.9, adult: Secondary | ICD-10-CM | POA: Diagnosis not present

## 2012-03-28 DIAGNOSIS — Z0000 Encounter for general adult medical examination without abnormal findings: Secondary | ICD-10-CM | POA: Diagnosis not present

## 2012-03-28 DIAGNOSIS — I10 Essential (primary) hypertension: Secondary | ICD-10-CM | POA: Diagnosis not present

## 2012-03-29 DIAGNOSIS — M5137 Other intervertebral disc degeneration, lumbosacral region: Secondary | ICD-10-CM | POA: Diagnosis not present

## 2012-03-29 DIAGNOSIS — M999 Biomechanical lesion, unspecified: Secondary | ICD-10-CM | POA: Diagnosis not present

## 2012-03-29 DIAGNOSIS — M5430 Sciatica, unspecified side: Secondary | ICD-10-CM | POA: Diagnosis not present

## 2012-04-04 DIAGNOSIS — Z13820 Encounter for screening for osteoporosis: Secondary | ICD-10-CM | POA: Diagnosis not present

## 2012-04-04 DIAGNOSIS — Z1231 Encounter for screening mammogram for malignant neoplasm of breast: Secondary | ICD-10-CM | POA: Diagnosis not present

## 2012-04-04 DIAGNOSIS — M899 Disorder of bone, unspecified: Secondary | ICD-10-CM | POA: Diagnosis not present

## 2012-04-04 DIAGNOSIS — M949 Disorder of cartilage, unspecified: Secondary | ICD-10-CM | POA: Diagnosis not present

## 2012-04-05 DIAGNOSIS — M5137 Other intervertebral disc degeneration, lumbosacral region: Secondary | ICD-10-CM | POA: Diagnosis not present

## 2012-04-05 DIAGNOSIS — M999 Biomechanical lesion, unspecified: Secondary | ICD-10-CM | POA: Diagnosis not present

## 2012-04-05 DIAGNOSIS — M5430 Sciatica, unspecified side: Secondary | ICD-10-CM | POA: Diagnosis not present

## 2012-04-11 DIAGNOSIS — E559 Vitamin D deficiency, unspecified: Secondary | ICD-10-CM | POA: Diagnosis not present

## 2012-04-11 DIAGNOSIS — Z794 Long term (current) use of insulin: Secondary | ICD-10-CM | POA: Diagnosis not present

## 2012-04-11 DIAGNOSIS — I10 Essential (primary) hypertension: Secondary | ICD-10-CM | POA: Diagnosis not present

## 2012-04-11 DIAGNOSIS — IMO0001 Reserved for inherently not codable concepts without codable children: Secondary | ICD-10-CM | POA: Diagnosis not present

## 2012-04-11 DIAGNOSIS — E785 Hyperlipidemia, unspecified: Secondary | ICD-10-CM | POA: Diagnosis not present

## 2012-04-17 DIAGNOSIS — K625 Hemorrhage of anus and rectum: Secondary | ICD-10-CM | POA: Diagnosis not present

## 2012-04-24 DIAGNOSIS — M5137 Other intervertebral disc degeneration, lumbosacral region: Secondary | ICD-10-CM | POA: Diagnosis not present

## 2012-04-24 DIAGNOSIS — M999 Biomechanical lesion, unspecified: Secondary | ICD-10-CM | POA: Diagnosis not present

## 2012-04-24 DIAGNOSIS — M5430 Sciatica, unspecified side: Secondary | ICD-10-CM | POA: Diagnosis not present

## 2012-05-08 DIAGNOSIS — Z794 Long term (current) use of insulin: Secondary | ICD-10-CM | POA: Diagnosis not present

## 2012-05-08 DIAGNOSIS — IMO0001 Reserved for inherently not codable concepts without codable children: Secondary | ICD-10-CM | POA: Diagnosis not present

## 2012-05-08 DIAGNOSIS — I10 Essential (primary) hypertension: Secondary | ICD-10-CM | POA: Diagnosis not present

## 2012-05-08 DIAGNOSIS — E785 Hyperlipidemia, unspecified: Secondary | ICD-10-CM | POA: Diagnosis not present

## 2012-05-08 DIAGNOSIS — E559 Vitamin D deficiency, unspecified: Secondary | ICD-10-CM | POA: Diagnosis not present

## 2012-05-10 DIAGNOSIS — M47812 Spondylosis without myelopathy or radiculopathy, cervical region: Secondary | ICD-10-CM | POA: Diagnosis not present

## 2012-05-10 DIAGNOSIS — M542 Cervicalgia: Secondary | ICD-10-CM | POA: Diagnosis not present

## 2012-05-16 DIAGNOSIS — M5412 Radiculopathy, cervical region: Secondary | ICD-10-CM | POA: Diagnosis not present

## 2012-05-16 DIAGNOSIS — M542 Cervicalgia: Secondary | ICD-10-CM | POA: Diagnosis not present

## 2012-05-16 DIAGNOSIS — M47812 Spondylosis without myelopathy or radiculopathy, cervical region: Secondary | ICD-10-CM | POA: Diagnosis not present

## 2012-05-18 DIAGNOSIS — M5412 Radiculopathy, cervical region: Secondary | ICD-10-CM | POA: Diagnosis not present

## 2012-05-18 DIAGNOSIS — M47812 Spondylosis without myelopathy or radiculopathy, cervical region: Secondary | ICD-10-CM | POA: Diagnosis not present

## 2012-05-18 DIAGNOSIS — M542 Cervicalgia: Secondary | ICD-10-CM | POA: Diagnosis not present

## 2012-05-21 DIAGNOSIS — M5412 Radiculopathy, cervical region: Secondary | ICD-10-CM | POA: Diagnosis not present

## 2012-05-21 DIAGNOSIS — M47812 Spondylosis without myelopathy or radiculopathy, cervical region: Secondary | ICD-10-CM | POA: Diagnosis not present

## 2012-05-21 DIAGNOSIS — M542 Cervicalgia: Secondary | ICD-10-CM | POA: Diagnosis not present

## 2012-05-23 DIAGNOSIS — M542 Cervicalgia: Secondary | ICD-10-CM | POA: Diagnosis not present

## 2012-05-23 DIAGNOSIS — M5412 Radiculopathy, cervical region: Secondary | ICD-10-CM | POA: Diagnosis not present

## 2012-05-23 DIAGNOSIS — M47812 Spondylosis without myelopathy or radiculopathy, cervical region: Secondary | ICD-10-CM | POA: Diagnosis not present

## 2012-05-30 DIAGNOSIS — M542 Cervicalgia: Secondary | ICD-10-CM | POA: Diagnosis not present

## 2012-05-30 DIAGNOSIS — M5412 Radiculopathy, cervical region: Secondary | ICD-10-CM | POA: Diagnosis not present

## 2012-05-30 DIAGNOSIS — M47812 Spondylosis without myelopathy or radiculopathy, cervical region: Secondary | ICD-10-CM | POA: Diagnosis not present

## 2012-06-04 DIAGNOSIS — M5412 Radiculopathy, cervical region: Secondary | ICD-10-CM | POA: Diagnosis not present

## 2012-06-04 DIAGNOSIS — M542 Cervicalgia: Secondary | ICD-10-CM | POA: Diagnosis not present

## 2012-06-04 DIAGNOSIS — M47812 Spondylosis without myelopathy or radiculopathy, cervical region: Secondary | ICD-10-CM | POA: Diagnosis not present

## 2012-06-06 DIAGNOSIS — M47812 Spondylosis without myelopathy or radiculopathy, cervical region: Secondary | ICD-10-CM | POA: Diagnosis not present

## 2012-06-06 DIAGNOSIS — M5412 Radiculopathy, cervical region: Secondary | ICD-10-CM | POA: Diagnosis not present

## 2012-06-06 DIAGNOSIS — M542 Cervicalgia: Secondary | ICD-10-CM | POA: Diagnosis not present

## 2012-06-11 DIAGNOSIS — M47812 Spondylosis without myelopathy or radiculopathy, cervical region: Secondary | ICD-10-CM | POA: Diagnosis not present

## 2012-06-11 DIAGNOSIS — M5412 Radiculopathy, cervical region: Secondary | ICD-10-CM | POA: Diagnosis not present

## 2012-06-11 DIAGNOSIS — M542 Cervicalgia: Secondary | ICD-10-CM | POA: Diagnosis not present

## 2012-06-12 DIAGNOSIS — M999 Biomechanical lesion, unspecified: Secondary | ICD-10-CM | POA: Diagnosis not present

## 2012-06-12 DIAGNOSIS — M5137 Other intervertebral disc degeneration, lumbosacral region: Secondary | ICD-10-CM | POA: Diagnosis not present

## 2012-06-12 DIAGNOSIS — M5430 Sciatica, unspecified side: Secondary | ICD-10-CM | POA: Diagnosis not present

## 2012-06-13 DIAGNOSIS — M47812 Spondylosis without myelopathy or radiculopathy, cervical region: Secondary | ICD-10-CM | POA: Diagnosis not present

## 2012-06-13 DIAGNOSIS — M542 Cervicalgia: Secondary | ICD-10-CM | POA: Diagnosis not present

## 2012-06-13 DIAGNOSIS — M5412 Radiculopathy, cervical region: Secondary | ICD-10-CM | POA: Diagnosis not present

## 2012-06-14 DIAGNOSIS — M542 Cervicalgia: Secondary | ICD-10-CM | POA: Diagnosis not present

## 2012-06-14 DIAGNOSIS — M47812 Spondylosis without myelopathy or radiculopathy, cervical region: Secondary | ICD-10-CM | POA: Diagnosis not present

## 2012-06-18 DIAGNOSIS — M542 Cervicalgia: Secondary | ICD-10-CM | POA: Diagnosis not present

## 2012-06-18 DIAGNOSIS — M47812 Spondylosis without myelopathy or radiculopathy, cervical region: Secondary | ICD-10-CM | POA: Diagnosis not present

## 2012-06-18 DIAGNOSIS — M5412 Radiculopathy, cervical region: Secondary | ICD-10-CM | POA: Diagnosis not present

## 2012-06-19 DIAGNOSIS — H2510 Age-related nuclear cataract, unspecified eye: Secondary | ICD-10-CM | POA: Diagnosis not present

## 2012-06-20 DIAGNOSIS — M5412 Radiculopathy, cervical region: Secondary | ICD-10-CM | POA: Diagnosis not present

## 2012-06-20 DIAGNOSIS — M47812 Spondylosis without myelopathy or radiculopathy, cervical region: Secondary | ICD-10-CM | POA: Diagnosis not present

## 2012-06-20 DIAGNOSIS — M542 Cervicalgia: Secondary | ICD-10-CM | POA: Diagnosis not present

## 2012-06-29 DIAGNOSIS — M5412 Radiculopathy, cervical region: Secondary | ICD-10-CM | POA: Diagnosis not present

## 2012-06-29 DIAGNOSIS — M542 Cervicalgia: Secondary | ICD-10-CM | POA: Diagnosis not present

## 2012-06-29 DIAGNOSIS — M47812 Spondylosis without myelopathy or radiculopathy, cervical region: Secondary | ICD-10-CM | POA: Diagnosis not present

## 2012-07-03 DIAGNOSIS — M5430 Sciatica, unspecified side: Secondary | ICD-10-CM | POA: Diagnosis not present

## 2012-07-03 DIAGNOSIS — M5137 Other intervertebral disc degeneration, lumbosacral region: Secondary | ICD-10-CM | POA: Diagnosis not present

## 2012-07-03 DIAGNOSIS — M999 Biomechanical lesion, unspecified: Secondary | ICD-10-CM | POA: Diagnosis not present

## 2012-07-11 DIAGNOSIS — E785 Hyperlipidemia, unspecified: Secondary | ICD-10-CM | POA: Diagnosis not present

## 2012-07-11 DIAGNOSIS — IMO0001 Reserved for inherently not codable concepts without codable children: Secondary | ICD-10-CM | POA: Diagnosis not present

## 2012-07-11 DIAGNOSIS — E559 Vitamin D deficiency, unspecified: Secondary | ICD-10-CM | POA: Diagnosis not present

## 2012-07-11 DIAGNOSIS — I10 Essential (primary) hypertension: Secondary | ICD-10-CM | POA: Diagnosis not present

## 2012-07-11 DIAGNOSIS — Z794 Long term (current) use of insulin: Secondary | ICD-10-CM | POA: Diagnosis not present

## 2012-07-14 DIAGNOSIS — M5137 Other intervertebral disc degeneration, lumbosacral region: Secondary | ICD-10-CM | POA: Diagnosis not present

## 2012-07-14 DIAGNOSIS — M5430 Sciatica, unspecified side: Secondary | ICD-10-CM | POA: Diagnosis not present

## 2012-07-14 DIAGNOSIS — M999 Biomechanical lesion, unspecified: Secondary | ICD-10-CM | POA: Diagnosis not present

## 2012-07-19 DIAGNOSIS — Z6828 Body mass index (BMI) 28.0-28.9, adult: Secondary | ICD-10-CM | POA: Diagnosis not present

## 2012-07-19 DIAGNOSIS — I10 Essential (primary) hypertension: Secondary | ICD-10-CM | POA: Diagnosis not present

## 2012-07-19 DIAGNOSIS — Z23 Encounter for immunization: Secondary | ICD-10-CM | POA: Diagnosis not present

## 2012-07-19 DIAGNOSIS — E785 Hyperlipidemia, unspecified: Secondary | ICD-10-CM | POA: Diagnosis not present

## 2012-07-19 DIAGNOSIS — IMO0001 Reserved for inherently not codable concepts without codable children: Secondary | ICD-10-CM | POA: Diagnosis not present

## 2012-07-28 DIAGNOSIS — M5137 Other intervertebral disc degeneration, lumbosacral region: Secondary | ICD-10-CM | POA: Diagnosis not present

## 2012-07-28 DIAGNOSIS — M999 Biomechanical lesion, unspecified: Secondary | ICD-10-CM | POA: Diagnosis not present

## 2012-07-28 DIAGNOSIS — M5430 Sciatica, unspecified side: Secondary | ICD-10-CM | POA: Diagnosis not present

## 2012-07-31 DIAGNOSIS — M999 Biomechanical lesion, unspecified: Secondary | ICD-10-CM | POA: Diagnosis not present

## 2012-07-31 DIAGNOSIS — M5430 Sciatica, unspecified side: Secondary | ICD-10-CM | POA: Diagnosis not present

## 2012-07-31 DIAGNOSIS — M5137 Other intervertebral disc degeneration, lumbosacral region: Secondary | ICD-10-CM | POA: Diagnosis not present

## 2012-08-07 DIAGNOSIS — M999 Biomechanical lesion, unspecified: Secondary | ICD-10-CM | POA: Diagnosis not present

## 2012-08-07 DIAGNOSIS — M5137 Other intervertebral disc degeneration, lumbosacral region: Secondary | ICD-10-CM | POA: Diagnosis not present

## 2012-08-07 DIAGNOSIS — M5430 Sciatica, unspecified side: Secondary | ICD-10-CM | POA: Diagnosis not present

## 2012-08-08 DIAGNOSIS — M542 Cervicalgia: Secondary | ICD-10-CM | POA: Diagnosis not present

## 2012-08-08 DIAGNOSIS — M5137 Other intervertebral disc degeneration, lumbosacral region: Secondary | ICD-10-CM | POA: Diagnosis not present

## 2012-08-08 DIAGNOSIS — M47812 Spondylosis without myelopathy or radiculopathy, cervical region: Secondary | ICD-10-CM | POA: Diagnosis not present

## 2012-08-08 DIAGNOSIS — M545 Low back pain, unspecified: Secondary | ICD-10-CM | POA: Diagnosis not present

## 2012-08-28 DIAGNOSIS — M5430 Sciatica, unspecified side: Secondary | ICD-10-CM | POA: Diagnosis not present

## 2012-08-28 DIAGNOSIS — M5137 Other intervertebral disc degeneration, lumbosacral region: Secondary | ICD-10-CM | POA: Diagnosis not present

## 2012-08-28 DIAGNOSIS — M999 Biomechanical lesion, unspecified: Secondary | ICD-10-CM | POA: Diagnosis not present

## 2012-09-19 DIAGNOSIS — M542 Cervicalgia: Secondary | ICD-10-CM | POA: Diagnosis not present

## 2012-09-19 DIAGNOSIS — M545 Low back pain, unspecified: Secondary | ICD-10-CM | POA: Diagnosis not present

## 2012-09-19 DIAGNOSIS — M47812 Spondylosis without myelopathy or radiculopathy, cervical region: Secondary | ICD-10-CM | POA: Diagnosis not present

## 2012-10-12 DIAGNOSIS — IMO0001 Reserved for inherently not codable concepts without codable children: Secondary | ICD-10-CM | POA: Diagnosis not present

## 2012-10-12 DIAGNOSIS — I10 Essential (primary) hypertension: Secondary | ICD-10-CM | POA: Diagnosis not present

## 2012-10-12 DIAGNOSIS — E785 Hyperlipidemia, unspecified: Secondary | ICD-10-CM | POA: Diagnosis not present

## 2012-10-12 DIAGNOSIS — E559 Vitamin D deficiency, unspecified: Secondary | ICD-10-CM | POA: Diagnosis not present

## 2012-10-12 DIAGNOSIS — Z794 Long term (current) use of insulin: Secondary | ICD-10-CM | POA: Diagnosis not present

## 2012-10-25 DIAGNOSIS — E785 Hyperlipidemia, unspecified: Secondary | ICD-10-CM | POA: Diagnosis not present

## 2012-10-25 DIAGNOSIS — J449 Chronic obstructive pulmonary disease, unspecified: Secondary | ICD-10-CM | POA: Diagnosis not present

## 2012-10-25 DIAGNOSIS — I10 Essential (primary) hypertension: Secondary | ICD-10-CM | POA: Diagnosis not present

## 2012-10-25 DIAGNOSIS — Z6828 Body mass index (BMI) 28.0-28.9, adult: Secondary | ICD-10-CM | POA: Diagnosis not present

## 2012-11-26 DIAGNOSIS — M5137 Other intervertebral disc degeneration, lumbosacral region: Secondary | ICD-10-CM | POA: Diagnosis not present

## 2012-11-26 DIAGNOSIS — M542 Cervicalgia: Secondary | ICD-10-CM | POA: Diagnosis not present

## 2012-11-26 DIAGNOSIS — M545 Low back pain, unspecified: Secondary | ICD-10-CM | POA: Diagnosis not present

## 2012-11-26 DIAGNOSIS — M47812 Spondylosis without myelopathy or radiculopathy, cervical region: Secondary | ICD-10-CM | POA: Diagnosis not present

## 2012-12-11 DIAGNOSIS — M5430 Sciatica, unspecified side: Secondary | ICD-10-CM | POA: Diagnosis not present

## 2012-12-11 DIAGNOSIS — M999 Biomechanical lesion, unspecified: Secondary | ICD-10-CM | POA: Diagnosis not present

## 2012-12-11 DIAGNOSIS — M5137 Other intervertebral disc degeneration, lumbosacral region: Secondary | ICD-10-CM | POA: Diagnosis not present

## 2013-01-15 DIAGNOSIS — E559 Vitamin D deficiency, unspecified: Secondary | ICD-10-CM | POA: Diagnosis not present

## 2013-01-15 DIAGNOSIS — E785 Hyperlipidemia, unspecified: Secondary | ICD-10-CM | POA: Diagnosis not present

## 2013-01-15 DIAGNOSIS — I10 Essential (primary) hypertension: Secondary | ICD-10-CM | POA: Diagnosis not present

## 2013-01-15 DIAGNOSIS — Z794 Long term (current) use of insulin: Secondary | ICD-10-CM | POA: Diagnosis not present

## 2013-01-15 DIAGNOSIS — IMO0001 Reserved for inherently not codable concepts without codable children: Secondary | ICD-10-CM | POA: Diagnosis not present

## 2013-01-29 DIAGNOSIS — M5137 Other intervertebral disc degeneration, lumbosacral region: Secondary | ICD-10-CM | POA: Diagnosis not present

## 2013-01-29 DIAGNOSIS — M5430 Sciatica, unspecified side: Secondary | ICD-10-CM | POA: Diagnosis not present

## 2013-01-29 DIAGNOSIS — M999 Biomechanical lesion, unspecified: Secondary | ICD-10-CM | POA: Diagnosis not present

## 2013-02-07 DIAGNOSIS — M5430 Sciatica, unspecified side: Secondary | ICD-10-CM | POA: Diagnosis not present

## 2013-02-07 DIAGNOSIS — M999 Biomechanical lesion, unspecified: Secondary | ICD-10-CM | POA: Diagnosis not present

## 2013-02-07 DIAGNOSIS — M5137 Other intervertebral disc degeneration, lumbosacral region: Secondary | ICD-10-CM | POA: Diagnosis not present

## 2013-02-27 DIAGNOSIS — M25519 Pain in unspecified shoulder: Secondary | ICD-10-CM | POA: Diagnosis not present

## 2013-03-02 DIAGNOSIS — M47817 Spondylosis without myelopathy or radiculopathy, lumbosacral region: Secondary | ICD-10-CM | POA: Diagnosis not present

## 2013-03-02 DIAGNOSIS — M5137 Other intervertebral disc degeneration, lumbosacral region: Secondary | ICD-10-CM | POA: Diagnosis not present

## 2013-03-19 DIAGNOSIS — M999 Biomechanical lesion, unspecified: Secondary | ICD-10-CM | POA: Diagnosis not present

## 2013-03-19 DIAGNOSIS — M5430 Sciatica, unspecified side: Secondary | ICD-10-CM | POA: Diagnosis not present

## 2013-03-19 DIAGNOSIS — M5137 Other intervertebral disc degeneration, lumbosacral region: Secondary | ICD-10-CM | POA: Diagnosis not present

## 2013-04-02 DIAGNOSIS — Z0000 Encounter for general adult medical examination without abnormal findings: Secondary | ICD-10-CM | POA: Diagnosis not present

## 2013-04-02 DIAGNOSIS — Z6829 Body mass index (BMI) 29.0-29.9, adult: Secondary | ICD-10-CM | POA: Diagnosis not present

## 2013-04-02 DIAGNOSIS — I10 Essential (primary) hypertension: Secondary | ICD-10-CM | POA: Diagnosis not present

## 2013-04-05 DIAGNOSIS — Z1231 Encounter for screening mammogram for malignant neoplasm of breast: Secondary | ICD-10-CM | POA: Diagnosis not present

## 2013-04-10 DIAGNOSIS — M545 Low back pain, unspecified: Secondary | ICD-10-CM | POA: Diagnosis not present

## 2013-04-10 DIAGNOSIS — M79609 Pain in unspecified limb: Secondary | ICD-10-CM | POA: Diagnosis not present

## 2013-04-10 DIAGNOSIS — IMO0002 Reserved for concepts with insufficient information to code with codable children: Secondary | ICD-10-CM | POA: Diagnosis not present

## 2013-04-10 DIAGNOSIS — M5137 Other intervertebral disc degeneration, lumbosacral region: Secondary | ICD-10-CM | POA: Diagnosis not present

## 2013-04-22 DIAGNOSIS — E785 Hyperlipidemia, unspecified: Secondary | ICD-10-CM | POA: Diagnosis not present

## 2013-04-22 DIAGNOSIS — Z794 Long term (current) use of insulin: Secondary | ICD-10-CM | POA: Diagnosis not present

## 2013-04-22 DIAGNOSIS — I10 Essential (primary) hypertension: Secondary | ICD-10-CM | POA: Diagnosis not present

## 2013-04-22 DIAGNOSIS — Z23 Encounter for immunization: Secondary | ICD-10-CM | POA: Diagnosis not present

## 2013-04-22 DIAGNOSIS — IMO0001 Reserved for inherently not codable concepts without codable children: Secondary | ICD-10-CM | POA: Diagnosis not present

## 2013-04-22 DIAGNOSIS — E559 Vitamin D deficiency, unspecified: Secondary | ICD-10-CM | POA: Diagnosis not present

## 2013-04-30 DIAGNOSIS — M545 Low back pain, unspecified: Secondary | ICD-10-CM | POA: Diagnosis not present

## 2013-04-30 DIAGNOSIS — M5126 Other intervertebral disc displacement, lumbar region: Secondary | ICD-10-CM | POA: Diagnosis not present

## 2013-04-30 DIAGNOSIS — M5430 Sciatica, unspecified side: Secondary | ICD-10-CM | POA: Diagnosis not present

## 2013-05-21 DIAGNOSIS — M961 Postlaminectomy syndrome, not elsewhere classified: Secondary | ICD-10-CM | POA: Diagnosis not present

## 2013-05-21 DIAGNOSIS — M47817 Spondylosis without myelopathy or radiculopathy, lumbosacral region: Secondary | ICD-10-CM | POA: Diagnosis not present

## 2013-06-10 DIAGNOSIS — M47817 Spondylosis without myelopathy or radiculopathy, lumbosacral region: Secondary | ICD-10-CM | POA: Diagnosis not present

## 2013-06-10 DIAGNOSIS — M545 Low back pain, unspecified: Secondary | ICD-10-CM | POA: Diagnosis not present

## 2013-07-03 DIAGNOSIS — J440 Chronic obstructive pulmonary disease with acute lower respiratory infection: Secondary | ICD-10-CM | POA: Diagnosis not present

## 2013-07-03 DIAGNOSIS — J019 Acute sinusitis, unspecified: Secondary | ICD-10-CM | POA: Diagnosis not present

## 2013-07-03 DIAGNOSIS — Z6830 Body mass index (BMI) 30.0-30.9, adult: Secondary | ICD-10-CM | POA: Diagnosis not present

## 2013-07-04 DIAGNOSIS — I781 Nevus, non-neoplastic: Secondary | ICD-10-CM | POA: Diagnosis not present

## 2013-07-04 DIAGNOSIS — L578 Other skin changes due to chronic exposure to nonionizing radiation: Secondary | ICD-10-CM | POA: Diagnosis not present

## 2013-07-04 DIAGNOSIS — L570 Actinic keratosis: Secondary | ICD-10-CM | POA: Diagnosis not present

## 2013-07-11 DIAGNOSIS — M999 Biomechanical lesion, unspecified: Secondary | ICD-10-CM | POA: Diagnosis not present

## 2013-07-11 DIAGNOSIS — M5430 Sciatica, unspecified side: Secondary | ICD-10-CM | POA: Diagnosis not present

## 2013-07-11 DIAGNOSIS — M5137 Other intervertebral disc degeneration, lumbosacral region: Secondary | ICD-10-CM | POA: Diagnosis not present

## 2013-07-22 DIAGNOSIS — M5126 Other intervertebral disc displacement, lumbar region: Secondary | ICD-10-CM | POA: Diagnosis not present

## 2013-07-22 DIAGNOSIS — M5430 Sciatica, unspecified side: Secondary | ICD-10-CM | POA: Diagnosis not present

## 2013-07-22 DIAGNOSIS — M47817 Spondylosis without myelopathy or radiculopathy, lumbosacral region: Secondary | ICD-10-CM | POA: Diagnosis not present

## 2013-07-23 DIAGNOSIS — J019 Acute sinusitis, unspecified: Secondary | ICD-10-CM | POA: Diagnosis not present

## 2013-07-23 DIAGNOSIS — R918 Other nonspecific abnormal finding of lung field: Secondary | ICD-10-CM | POA: Diagnosis not present

## 2013-07-23 DIAGNOSIS — B372 Candidiasis of skin and nail: Secondary | ICD-10-CM | POA: Diagnosis not present

## 2013-07-23 DIAGNOSIS — IMO0001 Reserved for inherently not codable concepts without codable children: Secondary | ICD-10-CM | POA: Diagnosis not present

## 2013-07-23 DIAGNOSIS — J209 Acute bronchitis, unspecified: Secondary | ICD-10-CM | POA: Diagnosis not present

## 2013-07-29 DIAGNOSIS — IMO0001 Reserved for inherently not codable concepts without codable children: Secondary | ICD-10-CM | POA: Diagnosis not present

## 2013-07-29 DIAGNOSIS — E559 Vitamin D deficiency, unspecified: Secondary | ICD-10-CM | POA: Diagnosis not present

## 2013-07-29 DIAGNOSIS — I10 Essential (primary) hypertension: Secondary | ICD-10-CM | POA: Diagnosis not present

## 2013-07-29 DIAGNOSIS — E785 Hyperlipidemia, unspecified: Secondary | ICD-10-CM | POA: Diagnosis not present

## 2013-07-29 DIAGNOSIS — Z794 Long term (current) use of insulin: Secondary | ICD-10-CM | POA: Diagnosis not present

## 2013-08-03 IMAGING — CT CT ANGIO CHEST
2 of 8 series · 14 of 36 positions shown · IV contrast (APPLIED)
Comparison: None.

CLINICAL DATA: Hemoptysis.  C5-C7 fusion 4 days ago.

CT ANGIOGRAPHY CHEST WITH CONTRAST 04/16/2011:
TECHNIQUE: Multidetector CT imaging of the chest was performed
using the standard protocol during bolus administration of
intravenous contrast.  Multiplanar CT image reconstructions
including MIPs were obtained to evaluate the vascular anatomy.
Contrast:  80 ml [id number] IV.

[Series 8: pulm embolism 1.0 b25f thins · axial · 0.63mm/px · z∈[-276,-60]mm · 13 of 253 slices shown]
[im 19/253  lung]
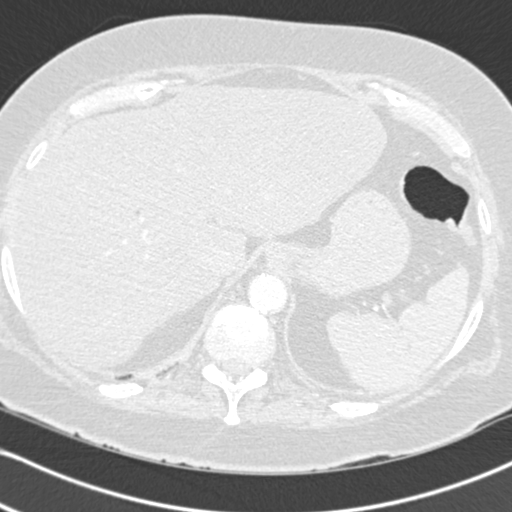
[im 37/253  mediastinal]
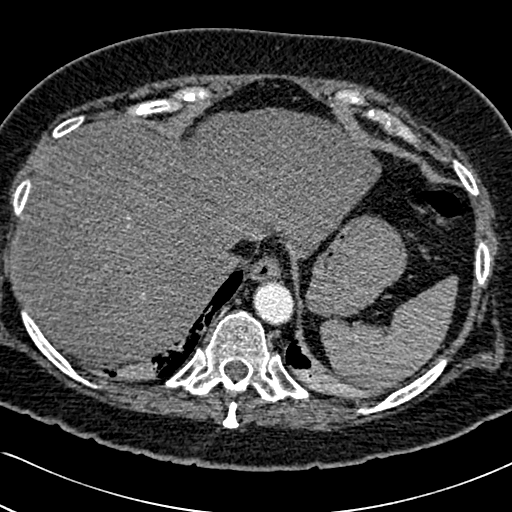
[im 55/253  lung]
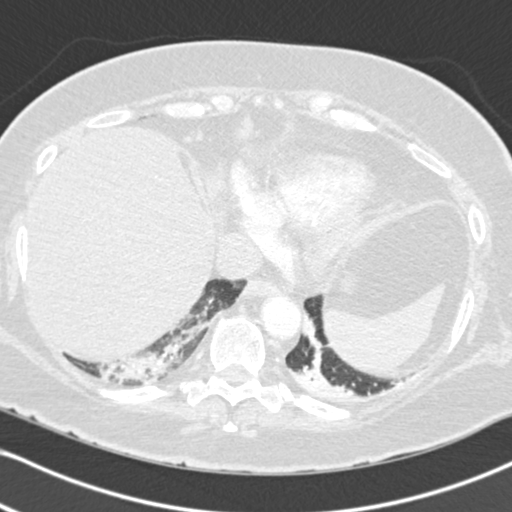
[im 73/253  mediastinal]
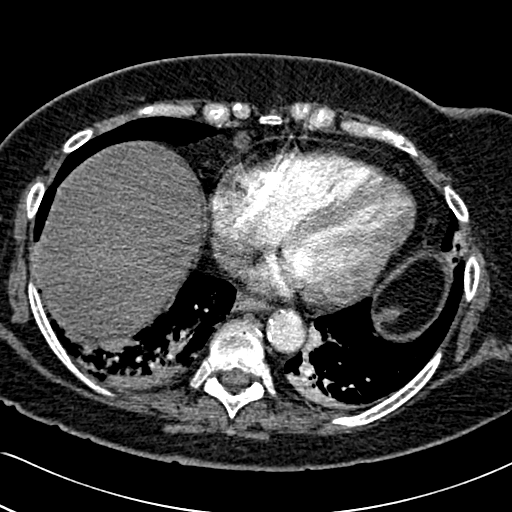
[im 91/253  lung]
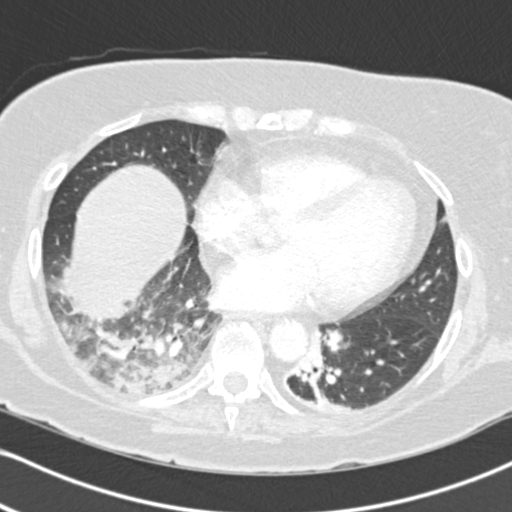
[im 109/253  mediastinal]
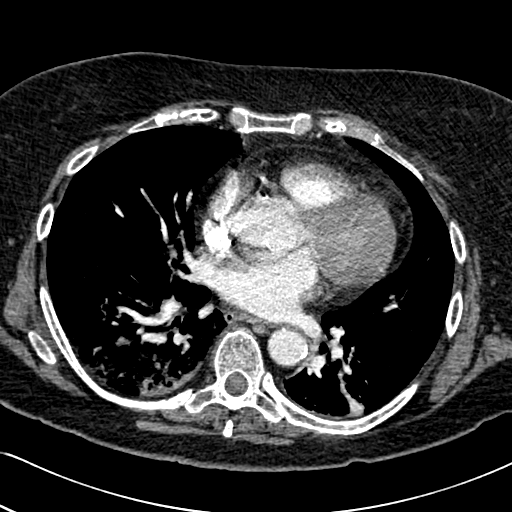
[im 127/253  lung]
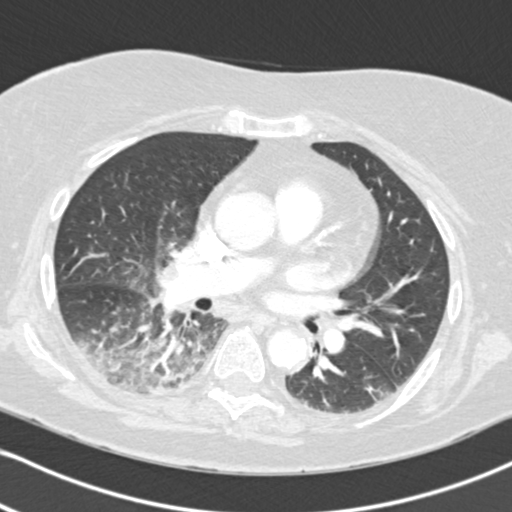
[im 145/253  mediastinal]
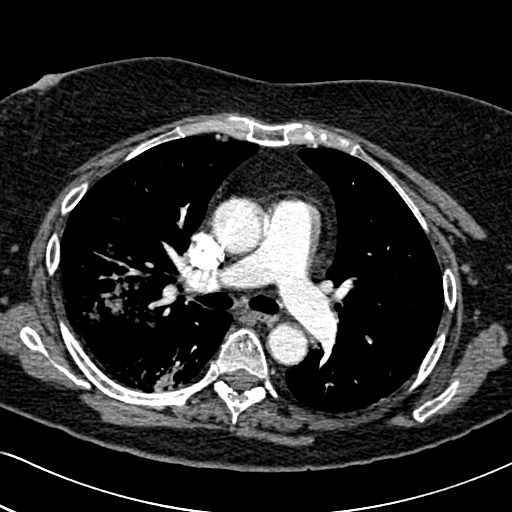
[im 163/253  lung]
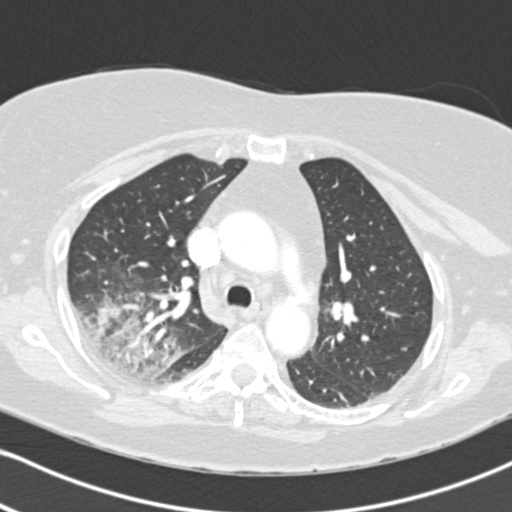
[im 181/253  mediastinal]
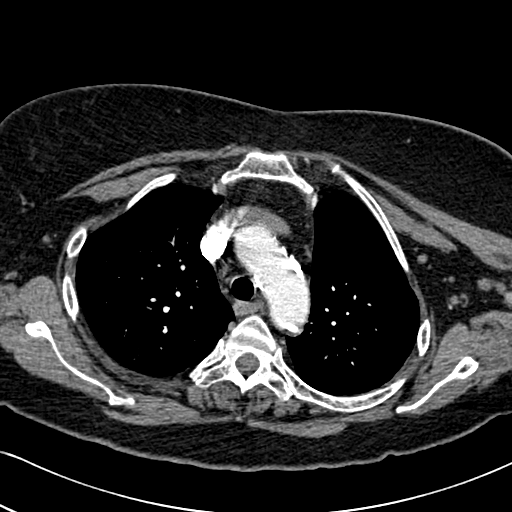
[im 199/253  lung]
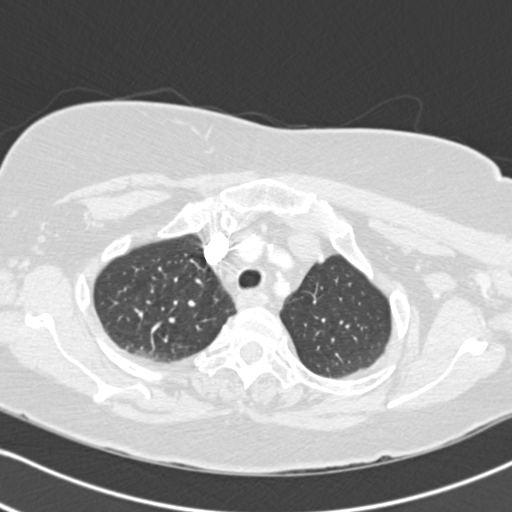
[im 217/253  mediastinal]
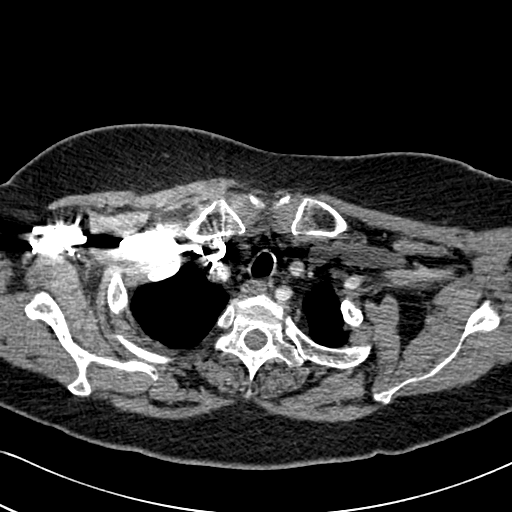
[im 235/253  lung]
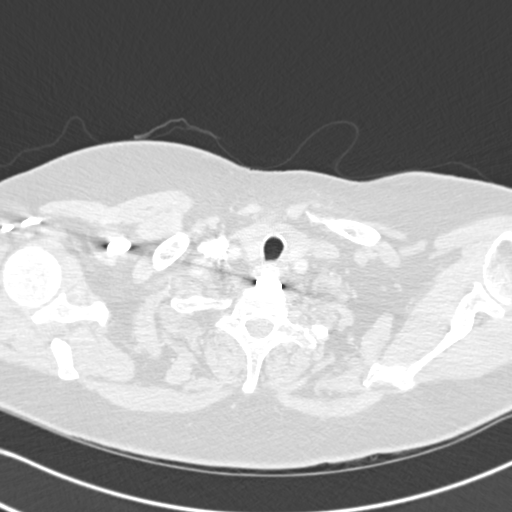

[Series 604: cor mpr · coronal · 0.63mm/px · 1 of 126 slices shown]
[im 63/126  mediastinal]
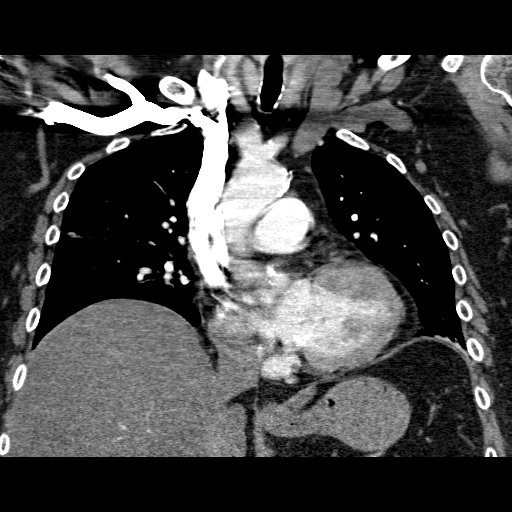

[14 of 36 positions shown; findings below may reference images not displayed]

FINDINGS: Contrast opacification of the pulmonary arteries is
good.  No filling defects within either main pulmonary artery or
their branches in either lung to suggest pulmonary embolism.  Heart
enlarged with left ventricular predominance and borderline to mild
left ventricular hypertrophy.  Severe three-vessel coronary artery
calcification.  No pericardial effusion.  Severe atherosclerosis
involving the thoracic and upper abdominal aorta without aneurysm
or dissection.  Proximal great vessels atherosclerotic though
patent.  Bovine aortic arch anatomy (left common carotid artery
arises from the left innominate artery).  Direct origin of the left
vertebral artery from the aortic arch.

Airspace consolidation in the posterior inferior right upper lobe
and throughout the right lower lobe.  Linear atelectasis in the
left lower lobe.  Lungs otherwise clear.  No interstitial opacities
to suggest pulmonary edema.  No pulmonary parenchymal nodules or
masses.  No pleural effusions.  Central airways patent.

Scattered upper normal-sized mediastinal and right hilar lymph
nodes, likely reactive.  No nodal masses.  Visualized thyroid gland
unremarkable.  Visualized upper abdomen demonstrates diffuse
steatosis throughout the visualized liver.  Bone window images
demonstrate mild mid thoracic spondylosis.

Review of the MIP images confirms the above findings.
IMPRESSION: 1.  No evidence of pulmonary embolism.
2.  Pneumonia involving the inferior right upper lobe and the right
lower lobe.
3.  Cardiomegaly.  Severe three-vessel coronary calcification.  No
evidence of pulmonary edema.
4.  Diffuse hepatic steatosis.

## 2013-08-17 DIAGNOSIS — J440 Chronic obstructive pulmonary disease with acute lower respiratory infection: Secondary | ICD-10-CM | POA: Diagnosis not present

## 2013-08-28 DIAGNOSIS — M545 Low back pain, unspecified: Secondary | ICD-10-CM | POA: Diagnosis not present

## 2013-08-28 DIAGNOSIS — M5430 Sciatica, unspecified side: Secondary | ICD-10-CM | POA: Diagnosis not present

## 2013-08-28 DIAGNOSIS — M5126 Other intervertebral disc displacement, lumbar region: Secondary | ICD-10-CM | POA: Diagnosis not present

## 2013-09-09 DIAGNOSIS — L570 Actinic keratosis: Secondary | ICD-10-CM | POA: Diagnosis not present

## 2013-09-26 DIAGNOSIS — M5430 Sciatica, unspecified side: Secondary | ICD-10-CM | POA: Diagnosis not present

## 2013-09-26 DIAGNOSIS — M5137 Other intervertebral disc degeneration, lumbosacral region: Secondary | ICD-10-CM | POA: Diagnosis not present

## 2013-09-26 DIAGNOSIS — M999 Biomechanical lesion, unspecified: Secondary | ICD-10-CM | POA: Diagnosis not present

## 2013-10-17 DIAGNOSIS — M5430 Sciatica, unspecified side: Secondary | ICD-10-CM | POA: Diagnosis not present

## 2013-10-17 DIAGNOSIS — M999 Biomechanical lesion, unspecified: Secondary | ICD-10-CM | POA: Diagnosis not present

## 2013-10-17 DIAGNOSIS — M5137 Other intervertebral disc degeneration, lumbosacral region: Secondary | ICD-10-CM | POA: Diagnosis not present

## 2013-11-04 DIAGNOSIS — E785 Hyperlipidemia, unspecified: Secondary | ICD-10-CM | POA: Diagnosis not present

## 2013-11-04 DIAGNOSIS — Z794 Long term (current) use of insulin: Secondary | ICD-10-CM | POA: Diagnosis not present

## 2013-11-04 DIAGNOSIS — IMO0001 Reserved for inherently not codable concepts without codable children: Secondary | ICD-10-CM | POA: Diagnosis not present

## 2013-11-04 DIAGNOSIS — E559 Vitamin D deficiency, unspecified: Secondary | ICD-10-CM | POA: Diagnosis not present

## 2013-11-04 DIAGNOSIS — I10 Essential (primary) hypertension: Secondary | ICD-10-CM | POA: Diagnosis not present

## 2013-11-27 DIAGNOSIS — L821 Other seborrheic keratosis: Secondary | ICD-10-CM | POA: Diagnosis not present

## 2013-11-27 DIAGNOSIS — L538 Other specified erythematous conditions: Secondary | ICD-10-CM | POA: Diagnosis not present

## 2013-11-27 DIAGNOSIS — I781 Nevus, non-neoplastic: Secondary | ICD-10-CM | POA: Diagnosis not present

## 2013-11-27 DIAGNOSIS — L820 Inflamed seborrheic keratosis: Secondary | ICD-10-CM | POA: Diagnosis not present

## 2013-12-05 DIAGNOSIS — M545 Low back pain, unspecified: Secondary | ICD-10-CM | POA: Diagnosis not present

## 2013-12-05 DIAGNOSIS — M5430 Sciatica, unspecified side: Secondary | ICD-10-CM | POA: Diagnosis not present

## 2013-12-05 DIAGNOSIS — M5126 Other intervertebral disc displacement, lumbar region: Secondary | ICD-10-CM | POA: Diagnosis not present

## 2013-12-10 DIAGNOSIS — M5430 Sciatica, unspecified side: Secondary | ICD-10-CM | POA: Diagnosis not present

## 2013-12-10 DIAGNOSIS — M999 Biomechanical lesion, unspecified: Secondary | ICD-10-CM | POA: Diagnosis not present

## 2013-12-10 DIAGNOSIS — M5137 Other intervertebral disc degeneration, lumbosacral region: Secondary | ICD-10-CM | POA: Diagnosis not present

## 2013-12-19 DIAGNOSIS — Z6830 Body mass index (BMI) 30.0-30.9, adult: Secondary | ICD-10-CM | POA: Diagnosis not present

## 2013-12-19 DIAGNOSIS — IMO0001 Reserved for inherently not codable concepts without codable children: Secondary | ICD-10-CM | POA: Diagnosis not present

## 2013-12-19 DIAGNOSIS — J449 Chronic obstructive pulmonary disease, unspecified: Secondary | ICD-10-CM | POA: Diagnosis not present

## 2013-12-19 DIAGNOSIS — E785 Hyperlipidemia, unspecified: Secondary | ICD-10-CM | POA: Diagnosis not present

## 2013-12-24 DIAGNOSIS — M999 Biomechanical lesion, unspecified: Secondary | ICD-10-CM | POA: Diagnosis not present

## 2013-12-24 DIAGNOSIS — M5430 Sciatica, unspecified side: Secondary | ICD-10-CM | POA: Diagnosis not present

## 2013-12-24 DIAGNOSIS — L820 Inflamed seborrheic keratosis: Secondary | ICD-10-CM | POA: Diagnosis not present

## 2013-12-24 DIAGNOSIS — I781 Nevus, non-neoplastic: Secondary | ICD-10-CM | POA: Diagnosis not present

## 2013-12-24 DIAGNOSIS — L538 Other specified erythematous conditions: Secondary | ICD-10-CM | POA: Diagnosis not present

## 2013-12-24 DIAGNOSIS — M5137 Other intervertebral disc degeneration, lumbosacral region: Secondary | ICD-10-CM | POA: Diagnosis not present

## 2013-12-24 DIAGNOSIS — L570 Actinic keratosis: Secondary | ICD-10-CM | POA: Diagnosis not present

## 2014-01-09 DIAGNOSIS — M5430 Sciatica, unspecified side: Secondary | ICD-10-CM | POA: Diagnosis not present

## 2014-01-09 DIAGNOSIS — M5137 Other intervertebral disc degeneration, lumbosacral region: Secondary | ICD-10-CM | POA: Diagnosis not present

## 2014-01-09 DIAGNOSIS — M999 Biomechanical lesion, unspecified: Secondary | ICD-10-CM | POA: Diagnosis not present

## 2014-01-28 DIAGNOSIS — M5430 Sciatica, unspecified side: Secondary | ICD-10-CM | POA: Diagnosis not present

## 2014-01-28 DIAGNOSIS — M999 Biomechanical lesion, unspecified: Secondary | ICD-10-CM | POA: Diagnosis not present

## 2014-01-28 DIAGNOSIS — M5137 Other intervertebral disc degeneration, lumbosacral region: Secondary | ICD-10-CM | POA: Diagnosis not present

## 2014-02-10 DIAGNOSIS — E785 Hyperlipidemia, unspecified: Secondary | ICD-10-CM | POA: Diagnosis not present

## 2014-02-10 DIAGNOSIS — I10 Essential (primary) hypertension: Secondary | ICD-10-CM | POA: Diagnosis not present

## 2014-02-10 DIAGNOSIS — Z794 Long term (current) use of insulin: Secondary | ICD-10-CM | POA: Diagnosis not present

## 2014-02-10 DIAGNOSIS — IMO0001 Reserved for inherently not codable concepts without codable children: Secondary | ICD-10-CM | POA: Diagnosis not present

## 2014-02-10 DIAGNOSIS — E559 Vitamin D deficiency, unspecified: Secondary | ICD-10-CM | POA: Diagnosis not present

## 2014-02-18 DIAGNOSIS — M999 Biomechanical lesion, unspecified: Secondary | ICD-10-CM | POA: Diagnosis not present

## 2014-02-18 DIAGNOSIS — M5137 Other intervertebral disc degeneration, lumbosacral region: Secondary | ICD-10-CM | POA: Diagnosis not present

## 2014-02-18 DIAGNOSIS — M5430 Sciatica, unspecified side: Secondary | ICD-10-CM | POA: Diagnosis not present

## 2014-03-10 DIAGNOSIS — L821 Other seborrheic keratosis: Secondary | ICD-10-CM | POA: Diagnosis not present

## 2014-03-10 DIAGNOSIS — L570 Actinic keratosis: Secondary | ICD-10-CM | POA: Diagnosis not present

## 2014-04-01 DIAGNOSIS — M5430 Sciatica, unspecified side: Secondary | ICD-10-CM | POA: Diagnosis not present

## 2014-04-01 DIAGNOSIS — M999 Biomechanical lesion, unspecified: Secondary | ICD-10-CM | POA: Diagnosis not present

## 2014-04-01 DIAGNOSIS — M5137 Other intervertebral disc degeneration, lumbosacral region: Secondary | ICD-10-CM | POA: Diagnosis not present

## 2014-04-01 DIAGNOSIS — J441 Chronic obstructive pulmonary disease with (acute) exacerbation: Secondary | ICD-10-CM | POA: Diagnosis not present

## 2014-04-24 DIAGNOSIS — M5186 Other intervertebral disc disorders, lumbar region: Secondary | ICD-10-CM | POA: Diagnosis not present

## 2014-04-24 DIAGNOSIS — M9903 Segmental and somatic dysfunction of lumbar region: Secondary | ICD-10-CM | POA: Diagnosis not present

## 2014-04-24 DIAGNOSIS — M9904 Segmental and somatic dysfunction of sacral region: Secondary | ICD-10-CM | POA: Diagnosis not present

## 2014-04-24 DIAGNOSIS — M5441 Lumbago with sciatica, right side: Secondary | ICD-10-CM | POA: Diagnosis not present

## 2014-04-29 DIAGNOSIS — Z1231 Encounter for screening mammogram for malignant neoplasm of breast: Secondary | ICD-10-CM | POA: Diagnosis not present

## 2014-04-29 DIAGNOSIS — R079 Chest pain, unspecified: Secondary | ICD-10-CM | POA: Diagnosis not present

## 2014-04-29 DIAGNOSIS — J449 Chronic obstructive pulmonary disease, unspecified: Secondary | ICD-10-CM | POA: Diagnosis not present

## 2014-04-29 DIAGNOSIS — E559 Vitamin D deficiency, unspecified: Secondary | ICD-10-CM | POA: Diagnosis not present

## 2014-04-29 DIAGNOSIS — E785 Hyperlipidemia, unspecified: Secondary | ICD-10-CM | POA: Diagnosis not present

## 2014-04-29 DIAGNOSIS — Z6830 Body mass index (BMI) 30.0-30.9, adult: Secondary | ICD-10-CM | POA: Diagnosis not present

## 2014-04-29 DIAGNOSIS — E1165 Type 2 diabetes mellitus with hyperglycemia: Secondary | ICD-10-CM | POA: Diagnosis not present

## 2014-05-05 DIAGNOSIS — J209 Acute bronchitis, unspecified: Secondary | ICD-10-CM | POA: Diagnosis not present

## 2014-05-05 DIAGNOSIS — R0602 Shortness of breath: Secondary | ICD-10-CM | POA: Diagnosis not present

## 2014-05-05 DIAGNOSIS — R531 Weakness: Secondary | ICD-10-CM | POA: Diagnosis not present

## 2014-05-05 DIAGNOSIS — N390 Urinary tract infection, site not specified: Secondary | ICD-10-CM | POA: Diagnosis not present

## 2014-05-05 DIAGNOSIS — I10 Essential (primary) hypertension: Secondary | ICD-10-CM | POA: Diagnosis not present

## 2014-05-05 DIAGNOSIS — E780 Pure hypercholesterolemia: Secondary | ICD-10-CM | POA: Diagnosis not present

## 2014-05-05 DIAGNOSIS — R079 Chest pain, unspecified: Secondary | ICD-10-CM | POA: Diagnosis not present

## 2014-05-05 DIAGNOSIS — J440 Chronic obstructive pulmonary disease with acute lower respiratory infection: Secondary | ICD-10-CM | POA: Diagnosis not present

## 2014-05-05 DIAGNOSIS — R05 Cough: Secondary | ICD-10-CM | POA: Diagnosis not present

## 2014-05-05 DIAGNOSIS — E119 Type 2 diabetes mellitus without complications: Secondary | ICD-10-CM | POA: Diagnosis not present

## 2014-05-07 DIAGNOSIS — J449 Chronic obstructive pulmonary disease, unspecified: Secondary | ICD-10-CM | POA: Diagnosis not present

## 2014-05-07 DIAGNOSIS — M79661 Pain in right lower leg: Secondary | ICD-10-CM | POA: Diagnosis not present

## 2014-05-07 DIAGNOSIS — R791 Abnormal coagulation profile: Secondary | ICD-10-CM | POA: Diagnosis not present

## 2014-05-07 DIAGNOSIS — R0789 Other chest pain: Secondary | ICD-10-CM | POA: Diagnosis not present

## 2014-05-07 DIAGNOSIS — Z6830 Body mass index (BMI) 30.0-30.9, adult: Secondary | ICD-10-CM | POA: Diagnosis not present

## 2014-05-12 DIAGNOSIS — M79661 Pain in right lower leg: Secondary | ICD-10-CM | POA: Diagnosis not present

## 2014-05-12 DIAGNOSIS — R079 Chest pain, unspecified: Secondary | ICD-10-CM | POA: Diagnosis not present

## 2014-05-12 DIAGNOSIS — R0602 Shortness of breath: Secondary | ICD-10-CM | POA: Diagnosis not present

## 2014-05-12 DIAGNOSIS — M79662 Pain in left lower leg: Secondary | ICD-10-CM | POA: Diagnosis not present

## 2014-05-13 DIAGNOSIS — R079 Chest pain, unspecified: Secondary | ICD-10-CM | POA: Diagnosis not present

## 2014-05-13 DIAGNOSIS — R0602 Shortness of breath: Secondary | ICD-10-CM | POA: Diagnosis not present

## 2014-05-22 DIAGNOSIS — M9904 Segmental and somatic dysfunction of sacral region: Secondary | ICD-10-CM | POA: Diagnosis not present

## 2014-05-22 DIAGNOSIS — M5186 Other intervertebral disc disorders, lumbar region: Secondary | ICD-10-CM | POA: Diagnosis not present

## 2014-05-22 DIAGNOSIS — M9903 Segmental and somatic dysfunction of lumbar region: Secondary | ICD-10-CM | POA: Diagnosis not present

## 2014-05-22 DIAGNOSIS — M5441 Lumbago with sciatica, right side: Secondary | ICD-10-CM | POA: Diagnosis not present

## 2014-05-23 DIAGNOSIS — Z6830 Body mass index (BMI) 30.0-30.9, adult: Secondary | ICD-10-CM | POA: Diagnosis not present

## 2014-05-23 DIAGNOSIS — R0989 Other specified symptoms and signs involving the circulatory and respiratory systems: Secondary | ICD-10-CM | POA: Diagnosis not present

## 2014-05-23 DIAGNOSIS — I10 Essential (primary) hypertension: Secondary | ICD-10-CM | POA: Diagnosis not present

## 2014-05-23 DIAGNOSIS — J449 Chronic obstructive pulmonary disease, unspecified: Secondary | ICD-10-CM | POA: Diagnosis not present

## 2014-05-27 DIAGNOSIS — M5186 Other intervertebral disc disorders, lumbar region: Secondary | ICD-10-CM | POA: Diagnosis not present

## 2014-05-27 DIAGNOSIS — M9904 Segmental and somatic dysfunction of sacral region: Secondary | ICD-10-CM | POA: Diagnosis not present

## 2014-05-27 DIAGNOSIS — M9903 Segmental and somatic dysfunction of lumbar region: Secondary | ICD-10-CM | POA: Diagnosis not present

## 2014-05-27 DIAGNOSIS — M5441 Lumbago with sciatica, right side: Secondary | ICD-10-CM | POA: Diagnosis not present

## 2014-05-29 DIAGNOSIS — M5127 Other intervertebral disc displacement, lumbosacral region: Secondary | ICD-10-CM | POA: Diagnosis not present

## 2014-05-29 DIAGNOSIS — M5417 Radiculopathy, lumbosacral region: Secondary | ICD-10-CM | POA: Diagnosis not present

## 2014-05-29 DIAGNOSIS — M47892 Other spondylosis, cervical region: Secondary | ICD-10-CM | POA: Diagnosis not present

## 2014-06-04 DIAGNOSIS — Z1231 Encounter for screening mammogram for malignant neoplasm of breast: Secondary | ICD-10-CM | POA: Diagnosis not present

## 2014-06-04 DIAGNOSIS — M5186 Other intervertebral disc disorders, lumbar region: Secondary | ICD-10-CM | POA: Diagnosis not present

## 2014-06-04 DIAGNOSIS — M9904 Segmental and somatic dysfunction of sacral region: Secondary | ICD-10-CM | POA: Diagnosis not present

## 2014-06-04 DIAGNOSIS — M8580 Other specified disorders of bone density and structure, unspecified site: Secondary | ICD-10-CM | POA: Diagnosis not present

## 2014-06-04 DIAGNOSIS — Z13820 Encounter for screening for osteoporosis: Secondary | ICD-10-CM | POA: Diagnosis not present

## 2014-06-04 DIAGNOSIS — Z780 Asymptomatic menopausal state: Secondary | ICD-10-CM | POA: Diagnosis not present

## 2014-06-04 DIAGNOSIS — M5441 Lumbago with sciatica, right side: Secondary | ICD-10-CM | POA: Diagnosis not present

## 2014-06-04 DIAGNOSIS — M9903 Segmental and somatic dysfunction of lumbar region: Secondary | ICD-10-CM | POA: Diagnosis not present

## 2014-06-06 DIAGNOSIS — Z6831 Body mass index (BMI) 31.0-31.9, adult: Secondary | ICD-10-CM | POA: Diagnosis not present

## 2014-06-06 DIAGNOSIS — I10 Essential (primary) hypertension: Secondary | ICD-10-CM | POA: Diagnosis not present

## 2014-06-06 DIAGNOSIS — Z23 Encounter for immunization: Secondary | ICD-10-CM | POA: Diagnosis not present

## 2014-06-06 DIAGNOSIS — R0989 Other specified symptoms and signs involving the circulatory and respiratory systems: Secondary | ICD-10-CM | POA: Diagnosis not present

## 2014-06-06 DIAGNOSIS — J449 Chronic obstructive pulmonary disease, unspecified: Secondary | ICD-10-CM | POA: Diagnosis not present

## 2014-06-16 DIAGNOSIS — E119 Type 2 diabetes mellitus without complications: Secondary | ICD-10-CM | POA: Diagnosis not present

## 2014-06-16 DIAGNOSIS — I10 Essential (primary) hypertension: Secondary | ICD-10-CM | POA: Diagnosis not present

## 2014-06-16 DIAGNOSIS — E780 Pure hypercholesterolemia: Secondary | ICD-10-CM | POA: Diagnosis not present

## 2014-06-17 DIAGNOSIS — M5417 Radiculopathy, lumbosacral region: Secondary | ICD-10-CM | POA: Diagnosis not present

## 2014-07-01 DIAGNOSIS — M542 Cervicalgia: Secondary | ICD-10-CM | POA: Diagnosis not present

## 2014-07-01 DIAGNOSIS — J45909 Unspecified asthma, uncomplicated: Secondary | ICD-10-CM | POA: Diagnosis not present

## 2014-07-01 DIAGNOSIS — M5417 Radiculopathy, lumbosacral region: Secondary | ICD-10-CM | POA: Diagnosis not present

## 2014-07-01 DIAGNOSIS — J449 Chronic obstructive pulmonary disease, unspecified: Secondary | ICD-10-CM | POA: Diagnosis not present

## 2014-07-01 DIAGNOSIS — M4716 Other spondylosis with myelopathy, lumbar region: Secondary | ICD-10-CM | POA: Diagnosis not present

## 2014-07-02 DIAGNOSIS — J449 Chronic obstructive pulmonary disease, unspecified: Secondary | ICD-10-CM | POA: Diagnosis not present

## 2014-07-02 DIAGNOSIS — J45909 Unspecified asthma, uncomplicated: Secondary | ICD-10-CM | POA: Diagnosis not present

## 2014-07-04 DIAGNOSIS — J45909 Unspecified asthma, uncomplicated: Secondary | ICD-10-CM | POA: Diagnosis not present

## 2014-07-04 DIAGNOSIS — J449 Chronic obstructive pulmonary disease, unspecified: Secondary | ICD-10-CM | POA: Diagnosis not present

## 2014-07-07 DIAGNOSIS — J449 Chronic obstructive pulmonary disease, unspecified: Secondary | ICD-10-CM | POA: Diagnosis not present

## 2014-07-07 DIAGNOSIS — J45909 Unspecified asthma, uncomplicated: Secondary | ICD-10-CM | POA: Diagnosis not present

## 2014-07-09 DIAGNOSIS — J449 Chronic obstructive pulmonary disease, unspecified: Secondary | ICD-10-CM | POA: Diagnosis not present

## 2014-07-09 DIAGNOSIS — J45909 Unspecified asthma, uncomplicated: Secondary | ICD-10-CM | POA: Diagnosis not present

## 2014-07-11 DIAGNOSIS — J45909 Unspecified asthma, uncomplicated: Secondary | ICD-10-CM | POA: Diagnosis not present

## 2014-07-11 DIAGNOSIS — J449 Chronic obstructive pulmonary disease, unspecified: Secondary | ICD-10-CM | POA: Diagnosis not present

## 2014-07-14 DIAGNOSIS — M5127 Other intervertebral disc displacement, lumbosacral region: Secondary | ICD-10-CM | POA: Diagnosis not present

## 2014-07-14 DIAGNOSIS — M4716 Other spondylosis with myelopathy, lumbar region: Secondary | ICD-10-CM | POA: Diagnosis not present

## 2014-07-14 DIAGNOSIS — M5417 Radiculopathy, lumbosacral region: Secondary | ICD-10-CM | POA: Diagnosis not present

## 2014-07-16 DIAGNOSIS — J449 Chronic obstructive pulmonary disease, unspecified: Secondary | ICD-10-CM | POA: Diagnosis not present

## 2014-07-16 DIAGNOSIS — J45909 Unspecified asthma, uncomplicated: Secondary | ICD-10-CM | POA: Diagnosis not present

## 2014-07-21 DIAGNOSIS — J45909 Unspecified asthma, uncomplicated: Secondary | ICD-10-CM | POA: Diagnosis not present

## 2014-07-21 DIAGNOSIS — J449 Chronic obstructive pulmonary disease, unspecified: Secondary | ICD-10-CM | POA: Diagnosis not present

## 2014-07-28 DIAGNOSIS — J449 Chronic obstructive pulmonary disease, unspecified: Secondary | ICD-10-CM | POA: Diagnosis not present

## 2014-07-28 DIAGNOSIS — J45998 Other asthma: Secondary | ICD-10-CM | POA: Diagnosis not present

## 2014-07-30 DIAGNOSIS — J45998 Other asthma: Secondary | ICD-10-CM | POA: Diagnosis not present

## 2014-07-30 DIAGNOSIS — J449 Chronic obstructive pulmonary disease, unspecified: Secondary | ICD-10-CM | POA: Diagnosis not present

## 2014-08-01 DIAGNOSIS — J45998 Other asthma: Secondary | ICD-10-CM | POA: Diagnosis not present

## 2014-08-01 DIAGNOSIS — J449 Chronic obstructive pulmonary disease, unspecified: Secondary | ICD-10-CM | POA: Diagnosis not present

## 2014-08-04 DIAGNOSIS — J449 Chronic obstructive pulmonary disease, unspecified: Secondary | ICD-10-CM | POA: Diagnosis not present

## 2014-08-04 DIAGNOSIS — J45998 Other asthma: Secondary | ICD-10-CM | POA: Diagnosis not present

## 2014-08-06 DIAGNOSIS — J45998 Other asthma: Secondary | ICD-10-CM | POA: Diagnosis not present

## 2014-08-06 DIAGNOSIS — J449 Chronic obstructive pulmonary disease, unspecified: Secondary | ICD-10-CM | POA: Diagnosis not present

## 2014-08-07 DIAGNOSIS — M9903 Segmental and somatic dysfunction of lumbar region: Secondary | ICD-10-CM | POA: Diagnosis not present

## 2014-08-07 DIAGNOSIS — M5441 Lumbago with sciatica, right side: Secondary | ICD-10-CM | POA: Diagnosis not present

## 2014-08-07 DIAGNOSIS — M5186 Other intervertebral disc disorders, lumbar region: Secondary | ICD-10-CM | POA: Diagnosis not present

## 2014-08-07 DIAGNOSIS — M9904 Segmental and somatic dysfunction of sacral region: Secondary | ICD-10-CM | POA: Diagnosis not present

## 2014-08-08 DIAGNOSIS — J449 Chronic obstructive pulmonary disease, unspecified: Secondary | ICD-10-CM | POA: Diagnosis not present

## 2014-08-08 DIAGNOSIS — J45998 Other asthma: Secondary | ICD-10-CM | POA: Diagnosis not present

## 2014-08-11 DIAGNOSIS — J449 Chronic obstructive pulmonary disease, unspecified: Secondary | ICD-10-CM | POA: Diagnosis not present

## 2014-08-11 DIAGNOSIS — J45998 Other asthma: Secondary | ICD-10-CM | POA: Diagnosis not present

## 2014-08-13 DIAGNOSIS — J449 Chronic obstructive pulmonary disease, unspecified: Secondary | ICD-10-CM | POA: Diagnosis not present

## 2014-08-13 DIAGNOSIS — J45998 Other asthma: Secondary | ICD-10-CM | POA: Diagnosis not present

## 2014-08-20 DIAGNOSIS — J449 Chronic obstructive pulmonary disease, unspecified: Secondary | ICD-10-CM | POA: Diagnosis not present

## 2014-08-20 DIAGNOSIS — J45998 Other asthma: Secondary | ICD-10-CM | POA: Diagnosis not present

## 2014-08-21 DIAGNOSIS — Z79891 Long term (current) use of opiate analgesic: Secondary | ICD-10-CM | POA: Diagnosis not present

## 2014-08-21 DIAGNOSIS — Z79899 Other long term (current) drug therapy: Secondary | ICD-10-CM | POA: Diagnosis not present

## 2014-08-21 DIAGNOSIS — M5417 Radiculopathy, lumbosacral region: Secondary | ICD-10-CM | POA: Diagnosis not present

## 2014-08-21 DIAGNOSIS — M5030 Other cervical disc degeneration, unspecified cervical region: Secondary | ICD-10-CM | POA: Diagnosis not present

## 2014-08-21 DIAGNOSIS — M47892 Other spondylosis, cervical region: Secondary | ICD-10-CM | POA: Diagnosis not present

## 2014-08-22 DIAGNOSIS — J449 Chronic obstructive pulmonary disease, unspecified: Secondary | ICD-10-CM | POA: Diagnosis not present

## 2014-08-22 DIAGNOSIS — J45998 Other asthma: Secondary | ICD-10-CM | POA: Diagnosis not present

## 2014-08-25 DIAGNOSIS — J45909 Unspecified asthma, uncomplicated: Secondary | ICD-10-CM | POA: Diagnosis not present

## 2014-08-25 DIAGNOSIS — J42 Unspecified chronic bronchitis: Secondary | ICD-10-CM | POA: Diagnosis not present

## 2014-08-27 DIAGNOSIS — J42 Unspecified chronic bronchitis: Secondary | ICD-10-CM | POA: Diagnosis not present

## 2014-08-27 DIAGNOSIS — J45909 Unspecified asthma, uncomplicated: Secondary | ICD-10-CM | POA: Diagnosis not present

## 2014-08-30 DIAGNOSIS — E559 Vitamin D deficiency, unspecified: Secondary | ICD-10-CM | POA: Diagnosis not present

## 2014-08-30 DIAGNOSIS — E1165 Type 2 diabetes mellitus with hyperglycemia: Secondary | ICD-10-CM | POA: Diagnosis not present

## 2014-08-30 DIAGNOSIS — E785 Hyperlipidemia, unspecified: Secondary | ICD-10-CM | POA: Diagnosis not present

## 2014-08-30 DIAGNOSIS — J440 Chronic obstructive pulmonary disease with acute lower respiratory infection: Secondary | ICD-10-CM | POA: Diagnosis not present

## 2014-08-30 DIAGNOSIS — I10 Essential (primary) hypertension: Secondary | ICD-10-CM | POA: Diagnosis not present

## 2014-09-01 DIAGNOSIS — J42 Unspecified chronic bronchitis: Secondary | ICD-10-CM | POA: Diagnosis not present

## 2014-09-01 DIAGNOSIS — J45909 Unspecified asthma, uncomplicated: Secondary | ICD-10-CM | POA: Diagnosis not present

## 2014-09-03 DIAGNOSIS — J42 Unspecified chronic bronchitis: Secondary | ICD-10-CM | POA: Diagnosis not present

## 2014-09-03 DIAGNOSIS — J45909 Unspecified asthma, uncomplicated: Secondary | ICD-10-CM | POA: Diagnosis not present

## 2014-09-05 DIAGNOSIS — J45909 Unspecified asthma, uncomplicated: Secondary | ICD-10-CM | POA: Diagnosis not present

## 2014-09-05 DIAGNOSIS — J42 Unspecified chronic bronchitis: Secondary | ICD-10-CM | POA: Diagnosis not present

## 2014-09-08 DIAGNOSIS — J42 Unspecified chronic bronchitis: Secondary | ICD-10-CM | POA: Diagnosis not present

## 2014-09-08 DIAGNOSIS — J45909 Unspecified asthma, uncomplicated: Secondary | ICD-10-CM | POA: Diagnosis not present

## 2014-09-10 DIAGNOSIS — J45909 Unspecified asthma, uncomplicated: Secondary | ICD-10-CM | POA: Diagnosis not present

## 2014-09-10 DIAGNOSIS — J42 Unspecified chronic bronchitis: Secondary | ICD-10-CM | POA: Diagnosis not present

## 2014-09-12 DIAGNOSIS — J45909 Unspecified asthma, uncomplicated: Secondary | ICD-10-CM | POA: Diagnosis not present

## 2014-09-12 DIAGNOSIS — J42 Unspecified chronic bronchitis: Secondary | ICD-10-CM | POA: Diagnosis not present

## 2014-09-15 DIAGNOSIS — J42 Unspecified chronic bronchitis: Secondary | ICD-10-CM | POA: Diagnosis not present

## 2014-09-15 DIAGNOSIS — J45909 Unspecified asthma, uncomplicated: Secondary | ICD-10-CM | POA: Diagnosis not present

## 2014-09-16 DIAGNOSIS — M9904 Segmental and somatic dysfunction of sacral region: Secondary | ICD-10-CM | POA: Diagnosis not present

## 2014-09-16 DIAGNOSIS — M9903 Segmental and somatic dysfunction of lumbar region: Secondary | ICD-10-CM | POA: Diagnosis not present

## 2014-09-16 DIAGNOSIS — M5441 Lumbago with sciatica, right side: Secondary | ICD-10-CM | POA: Diagnosis not present

## 2014-09-16 DIAGNOSIS — M5186 Other intervertebral disc disorders, lumbar region: Secondary | ICD-10-CM | POA: Diagnosis not present

## 2014-09-17 DIAGNOSIS — J42 Unspecified chronic bronchitis: Secondary | ICD-10-CM | POA: Diagnosis not present

## 2014-09-17 DIAGNOSIS — J45909 Unspecified asthma, uncomplicated: Secondary | ICD-10-CM | POA: Diagnosis not present

## 2014-11-04 DIAGNOSIS — M5441 Lumbago with sciatica, right side: Secondary | ICD-10-CM | POA: Diagnosis not present

## 2014-11-04 DIAGNOSIS — M5186 Other intervertebral disc disorders, lumbar region: Secondary | ICD-10-CM | POA: Diagnosis not present

## 2014-11-04 DIAGNOSIS — M9903 Segmental and somatic dysfunction of lumbar region: Secondary | ICD-10-CM | POA: Diagnosis not present

## 2014-11-04 DIAGNOSIS — M9904 Segmental and somatic dysfunction of sacral region: Secondary | ICD-10-CM | POA: Diagnosis not present

## 2014-11-05 DIAGNOSIS — M810 Age-related osteoporosis without current pathological fracture: Secondary | ICD-10-CM | POA: Diagnosis not present

## 2014-11-17 DIAGNOSIS — N63 Unspecified lump in breast: Secondary | ICD-10-CM | POA: Diagnosis not present

## 2014-11-17 DIAGNOSIS — E669 Obesity, unspecified: Secondary | ICD-10-CM | POA: Diagnosis not present

## 2014-11-17 DIAGNOSIS — Z6830 Body mass index (BMI) 30.0-30.9, adult: Secondary | ICD-10-CM | POA: Diagnosis not present

## 2014-11-27 DIAGNOSIS — M5441 Lumbago with sciatica, right side: Secondary | ICD-10-CM | POA: Diagnosis not present

## 2014-11-27 DIAGNOSIS — M5186 Other intervertebral disc disorders, lumbar region: Secondary | ICD-10-CM | POA: Diagnosis not present

## 2014-11-27 DIAGNOSIS — M9903 Segmental and somatic dysfunction of lumbar region: Secondary | ICD-10-CM | POA: Diagnosis not present

## 2014-11-27 DIAGNOSIS — M9904 Segmental and somatic dysfunction of sacral region: Secondary | ICD-10-CM | POA: Diagnosis not present

## 2014-11-28 DIAGNOSIS — Z6830 Body mass index (BMI) 30.0-30.9, adult: Secondary | ICD-10-CM | POA: Diagnosis not present

## 2014-11-28 DIAGNOSIS — E1165 Type 2 diabetes mellitus with hyperglycemia: Secondary | ICD-10-CM | POA: Diagnosis not present

## 2014-11-28 DIAGNOSIS — J302 Other seasonal allergic rhinitis: Secondary | ICD-10-CM | POA: Diagnosis not present

## 2014-11-28 DIAGNOSIS — Z9181 History of falling: Secondary | ICD-10-CM | POA: Diagnosis not present

## 2014-11-28 DIAGNOSIS — I10 Essential (primary) hypertension: Secondary | ICD-10-CM | POA: Diagnosis not present

## 2014-11-28 DIAGNOSIS — E785 Hyperlipidemia, unspecified: Secondary | ICD-10-CM | POA: Diagnosis not present

## 2014-11-28 DIAGNOSIS — Z1389 Encounter for screening for other disorder: Secondary | ICD-10-CM | POA: Diagnosis not present

## 2014-11-28 DIAGNOSIS — E559 Vitamin D deficiency, unspecified: Secondary | ICD-10-CM | POA: Diagnosis not present

## 2014-12-11 DIAGNOSIS — M5186 Other intervertebral disc disorders, lumbar region: Secondary | ICD-10-CM | POA: Diagnosis not present

## 2014-12-11 DIAGNOSIS — M9903 Segmental and somatic dysfunction of lumbar region: Secondary | ICD-10-CM | POA: Diagnosis not present

## 2014-12-11 DIAGNOSIS — M5441 Lumbago with sciatica, right side: Secondary | ICD-10-CM | POA: Diagnosis not present

## 2014-12-11 DIAGNOSIS — M9904 Segmental and somatic dysfunction of sacral region: Secondary | ICD-10-CM | POA: Diagnosis not present

## 2014-12-29 DIAGNOSIS — J441 Chronic obstructive pulmonary disease with (acute) exacerbation: Secondary | ICD-10-CM | POA: Diagnosis not present

## 2014-12-29 DIAGNOSIS — J019 Acute sinusitis, unspecified: Secondary | ICD-10-CM | POA: Diagnosis not present

## 2014-12-29 DIAGNOSIS — Z6830 Body mass index (BMI) 30.0-30.9, adult: Secondary | ICD-10-CM | POA: Diagnosis not present

## 2015-02-10 DIAGNOSIS — M5186 Other intervertebral disc disorders, lumbar region: Secondary | ICD-10-CM | POA: Diagnosis not present

## 2015-02-10 DIAGNOSIS — M9904 Segmental and somatic dysfunction of sacral region: Secondary | ICD-10-CM | POA: Diagnosis not present

## 2015-02-10 DIAGNOSIS — M5441 Lumbago with sciatica, right side: Secondary | ICD-10-CM | POA: Diagnosis not present

## 2015-02-10 DIAGNOSIS — M9903 Segmental and somatic dysfunction of lumbar region: Secondary | ICD-10-CM | POA: Diagnosis not present

## 2015-02-19 DIAGNOSIS — N390 Urinary tract infection, site not specified: Secondary | ICD-10-CM | POA: Diagnosis not present

## 2015-02-19 DIAGNOSIS — Z6830 Body mass index (BMI) 30.0-30.9, adult: Secondary | ICD-10-CM | POA: Diagnosis not present

## 2015-03-05 DIAGNOSIS — M5441 Lumbago with sciatica, right side: Secondary | ICD-10-CM | POA: Diagnosis not present

## 2015-03-05 DIAGNOSIS — M9903 Segmental and somatic dysfunction of lumbar region: Secondary | ICD-10-CM | POA: Diagnosis not present

## 2015-03-05 DIAGNOSIS — M5186 Other intervertebral disc disorders, lumbar region: Secondary | ICD-10-CM | POA: Diagnosis not present

## 2015-03-05 DIAGNOSIS — M9904 Segmental and somatic dysfunction of sacral region: Secondary | ICD-10-CM | POA: Diagnosis not present

## 2015-03-10 DIAGNOSIS — I10 Essential (primary) hypertension: Secondary | ICD-10-CM | POA: Diagnosis not present

## 2015-03-10 DIAGNOSIS — E785 Hyperlipidemia, unspecified: Secondary | ICD-10-CM | POA: Diagnosis not present

## 2015-03-10 DIAGNOSIS — E1129 Type 2 diabetes mellitus with other diabetic kidney complication: Secondary | ICD-10-CM | POA: Diagnosis not present

## 2015-03-10 DIAGNOSIS — Z6830 Body mass index (BMI) 30.0-30.9, adult: Secondary | ICD-10-CM | POA: Diagnosis not present

## 2015-03-10 DIAGNOSIS — E559 Vitamin D deficiency, unspecified: Secondary | ICD-10-CM | POA: Diagnosis not present

## 2015-03-10 DIAGNOSIS — Z23 Encounter for immunization: Secondary | ICD-10-CM | POA: Diagnosis not present

## 2015-04-02 DIAGNOSIS — H2513 Age-related nuclear cataract, bilateral: Secondary | ICD-10-CM | POA: Diagnosis not present

## 2015-04-02 DIAGNOSIS — M5186 Other intervertebral disc disorders, lumbar region: Secondary | ICD-10-CM | POA: Diagnosis not present

## 2015-04-02 DIAGNOSIS — M9904 Segmental and somatic dysfunction of sacral region: Secondary | ICD-10-CM | POA: Diagnosis not present

## 2015-04-02 DIAGNOSIS — M5441 Lumbago with sciatica, right side: Secondary | ICD-10-CM | POA: Diagnosis not present

## 2015-04-02 DIAGNOSIS — M9903 Segmental and somatic dysfunction of lumbar region: Secondary | ICD-10-CM | POA: Diagnosis not present

## 2015-05-08 DIAGNOSIS — M810 Age-related osteoporosis without current pathological fracture: Secondary | ICD-10-CM | POA: Diagnosis not present

## 2015-05-12 DIAGNOSIS — B029 Zoster without complications: Secondary | ICD-10-CM | POA: Diagnosis not present

## 2015-05-12 DIAGNOSIS — Z6829 Body mass index (BMI) 29.0-29.9, adult: Secondary | ICD-10-CM | POA: Diagnosis not present

## 2015-05-22 DIAGNOSIS — L570 Actinic keratosis: Secondary | ICD-10-CM | POA: Diagnosis not present

## 2015-05-22 DIAGNOSIS — D0461 Carcinoma in situ of skin of right upper limb, including shoulder: Secondary | ICD-10-CM | POA: Diagnosis not present

## 2015-05-22 DIAGNOSIS — L821 Other seborrheic keratosis: Secondary | ICD-10-CM | POA: Diagnosis not present

## 2015-05-22 DIAGNOSIS — L578 Other skin changes due to chronic exposure to nonionizing radiation: Secondary | ICD-10-CM | POA: Diagnosis not present

## 2015-06-15 DIAGNOSIS — Z1231 Encounter for screening mammogram for malignant neoplasm of breast: Secondary | ICD-10-CM | POA: Diagnosis not present

## 2015-06-15 DIAGNOSIS — E559 Vitamin D deficiency, unspecified: Secondary | ICD-10-CM | POA: Diagnosis not present

## 2015-06-15 DIAGNOSIS — I10 Essential (primary) hypertension: Secondary | ICD-10-CM | POA: Diagnosis not present

## 2015-06-15 DIAGNOSIS — E785 Hyperlipidemia, unspecified: Secondary | ICD-10-CM | POA: Diagnosis not present

## 2015-06-15 DIAGNOSIS — E663 Overweight: Secondary | ICD-10-CM | POA: Diagnosis not present

## 2015-06-15 DIAGNOSIS — Z6828 Body mass index (BMI) 28.0-28.9, adult: Secondary | ICD-10-CM | POA: Diagnosis not present

## 2015-06-15 DIAGNOSIS — Z23 Encounter for immunization: Secondary | ICD-10-CM | POA: Diagnosis not present

## 2015-06-15 DIAGNOSIS — Z1389 Encounter for screening for other disorder: Secondary | ICD-10-CM | POA: Diagnosis not present

## 2015-06-15 DIAGNOSIS — E1129 Type 2 diabetes mellitus with other diabetic kidney complication: Secondary | ICD-10-CM | POA: Diagnosis not present

## 2015-06-15 DIAGNOSIS — Z9181 History of falling: Secondary | ICD-10-CM | POA: Diagnosis not present

## 2015-06-29 DIAGNOSIS — N63 Unspecified lump in breast: Secondary | ICD-10-CM | POA: Diagnosis not present

## 2015-07-07 DIAGNOSIS — E876 Hypokalemia: Secondary | ICD-10-CM | POA: Diagnosis not present

## 2015-07-07 DIAGNOSIS — J029 Acute pharyngitis, unspecified: Secondary | ICD-10-CM | POA: Diagnosis not present

## 2015-07-07 DIAGNOSIS — Z6828 Body mass index (BMI) 28.0-28.9, adult: Secondary | ICD-10-CM | POA: Diagnosis not present

## 2015-08-21 DIAGNOSIS — M25561 Pain in right knee: Secondary | ICD-10-CM | POA: Diagnosis not present

## 2015-08-21 DIAGNOSIS — M961 Postlaminectomy syndrome, not elsewhere classified: Secondary | ICD-10-CM | POA: Diagnosis not present

## 2015-08-21 DIAGNOSIS — M4726 Other spondylosis with radiculopathy, lumbar region: Secondary | ICD-10-CM | POA: Diagnosis not present

## 2015-08-24 DIAGNOSIS — J441 Chronic obstructive pulmonary disease with (acute) exacerbation: Secondary | ICD-10-CM | POA: Diagnosis not present

## 2015-08-24 DIAGNOSIS — Z6828 Body mass index (BMI) 28.0-28.9, adult: Secondary | ICD-10-CM | POA: Diagnosis not present

## 2015-08-24 DIAGNOSIS — J019 Acute sinusitis, unspecified: Secondary | ICD-10-CM | POA: Diagnosis not present

## 2015-08-29 DIAGNOSIS — M5126 Other intervertebral disc displacement, lumbar region: Secondary | ICD-10-CM | POA: Diagnosis not present

## 2015-08-29 DIAGNOSIS — M4726 Other spondylosis with radiculopathy, lumbar region: Secondary | ICD-10-CM | POA: Diagnosis not present

## 2015-08-29 DIAGNOSIS — M5116 Intervertebral disc disorders with radiculopathy, lumbar region: Secondary | ICD-10-CM | POA: Diagnosis not present

## 2015-08-29 DIAGNOSIS — M5136 Other intervertebral disc degeneration, lumbar region: Secondary | ICD-10-CM | POA: Diagnosis not present

## 2015-08-29 DIAGNOSIS — M4806 Spinal stenosis, lumbar region: Secondary | ICD-10-CM | POA: Diagnosis not present

## 2015-09-11 DIAGNOSIS — M961 Postlaminectomy syndrome, not elsewhere classified: Secondary | ICD-10-CM | POA: Diagnosis not present

## 2015-09-11 DIAGNOSIS — M5417 Radiculopathy, lumbosacral region: Secondary | ICD-10-CM | POA: Diagnosis not present

## 2015-09-11 DIAGNOSIS — M25561 Pain in right knee: Secondary | ICD-10-CM | POA: Diagnosis not present

## 2015-09-28 DIAGNOSIS — E559 Vitamin D deficiency, unspecified: Secondary | ICD-10-CM | POA: Diagnosis not present

## 2015-09-28 DIAGNOSIS — Z6828 Body mass index (BMI) 28.0-28.9, adult: Secondary | ICD-10-CM | POA: Diagnosis not present

## 2015-09-28 DIAGNOSIS — I10 Essential (primary) hypertension: Secondary | ICD-10-CM | POA: Diagnosis not present

## 2015-09-28 DIAGNOSIS — J449 Chronic obstructive pulmonary disease, unspecified: Secondary | ICD-10-CM | POA: Diagnosis not present

## 2015-09-28 DIAGNOSIS — E1129 Type 2 diabetes mellitus with other diabetic kidney complication: Secondary | ICD-10-CM | POA: Diagnosis not present

## 2015-09-28 DIAGNOSIS — J019 Acute sinusitis, unspecified: Secondary | ICD-10-CM | POA: Diagnosis not present

## 2015-09-28 DIAGNOSIS — E785 Hyperlipidemia, unspecified: Secondary | ICD-10-CM | POA: Diagnosis not present

## 2015-10-02 DIAGNOSIS — M47892 Other spondylosis, cervical region: Secondary | ICD-10-CM | POA: Diagnosis not present

## 2015-10-02 DIAGNOSIS — M542 Cervicalgia: Secondary | ICD-10-CM | POA: Diagnosis not present

## 2015-11-09 DIAGNOSIS — M810 Age-related osteoporosis without current pathological fracture: Secondary | ICD-10-CM | POA: Diagnosis not present

## 2015-11-09 DIAGNOSIS — N390 Urinary tract infection, site not specified: Secondary | ICD-10-CM | POA: Diagnosis not present

## 2015-11-09 DIAGNOSIS — Z6828 Body mass index (BMI) 28.0-28.9, adult: Secondary | ICD-10-CM | POA: Diagnosis not present

## 2015-11-20 DIAGNOSIS — L570 Actinic keratosis: Secondary | ICD-10-CM | POA: Diagnosis not present

## 2015-11-20 DIAGNOSIS — L578 Other skin changes due to chronic exposure to nonionizing radiation: Secondary | ICD-10-CM | POA: Diagnosis not present

## 2015-11-20 DIAGNOSIS — D0461 Carcinoma in situ of skin of right upper limb, including shoulder: Secondary | ICD-10-CM | POA: Diagnosis not present

## 2015-11-20 DIAGNOSIS — B079 Viral wart, unspecified: Secondary | ICD-10-CM | POA: Diagnosis not present

## 2015-12-05 DIAGNOSIS — J441 Chronic obstructive pulmonary disease with (acute) exacerbation: Secondary | ICD-10-CM | POA: Diagnosis not present

## 2016-01-06 DIAGNOSIS — S99912A Unspecified injury of left ankle, initial encounter: Secondary | ICD-10-CM | POA: Diagnosis not present

## 2016-01-06 DIAGNOSIS — M25572 Pain in left ankle and joints of left foot: Secondary | ICD-10-CM | POA: Diagnosis not present

## 2016-01-09 DIAGNOSIS — E663 Overweight: Secondary | ICD-10-CM | POA: Diagnosis not present

## 2016-01-09 DIAGNOSIS — E1129 Type 2 diabetes mellitus with other diabetic kidney complication: Secondary | ICD-10-CM | POA: Diagnosis not present

## 2016-01-09 DIAGNOSIS — J441 Chronic obstructive pulmonary disease with (acute) exacerbation: Secondary | ICD-10-CM | POA: Diagnosis not present

## 2016-01-09 DIAGNOSIS — Z6829 Body mass index (BMI) 29.0-29.9, adult: Secondary | ICD-10-CM | POA: Diagnosis not present

## 2016-01-09 DIAGNOSIS — E785 Hyperlipidemia, unspecified: Secondary | ICD-10-CM | POA: Diagnosis not present

## 2016-01-09 DIAGNOSIS — E559 Vitamin D deficiency, unspecified: Secondary | ICD-10-CM | POA: Diagnosis not present

## 2016-01-26 DIAGNOSIS — S81801S Unspecified open wound, right lower leg, sequela: Secondary | ICD-10-CM | POA: Diagnosis not present

## 2016-01-26 DIAGNOSIS — T148 Other injury of unspecified body region: Secondary | ICD-10-CM | POA: Diagnosis not present

## 2016-02-01 DIAGNOSIS — S81801A Unspecified open wound, right lower leg, initial encounter: Secondary | ICD-10-CM | POA: Diagnosis not present

## 2016-02-01 DIAGNOSIS — Z6829 Body mass index (BMI) 29.0-29.9, adult: Secondary | ICD-10-CM | POA: Diagnosis not present

## 2016-03-08 DIAGNOSIS — M5186 Other intervertebral disc disorders, lumbar region: Secondary | ICD-10-CM | POA: Diagnosis not present

## 2016-03-08 DIAGNOSIS — M5441 Lumbago with sciatica, right side: Secondary | ICD-10-CM | POA: Diagnosis not present

## 2016-03-08 DIAGNOSIS — M9904 Segmental and somatic dysfunction of sacral region: Secondary | ICD-10-CM | POA: Diagnosis not present

## 2016-03-08 DIAGNOSIS — M9903 Segmental and somatic dysfunction of lumbar region: Secondary | ICD-10-CM | POA: Diagnosis not present

## 2016-03-10 DIAGNOSIS — M9905 Segmental and somatic dysfunction of pelvic region: Secondary | ICD-10-CM | POA: Diagnosis not present

## 2016-03-10 DIAGNOSIS — M5416 Radiculopathy, lumbar region: Secondary | ICD-10-CM | POA: Diagnosis not present

## 2016-03-10 DIAGNOSIS — M542 Cervicalgia: Secondary | ICD-10-CM | POA: Diagnosis not present

## 2016-03-10 DIAGNOSIS — M9903 Segmental and somatic dysfunction of lumbar region: Secondary | ICD-10-CM | POA: Diagnosis not present

## 2016-03-10 DIAGNOSIS — M9902 Segmental and somatic dysfunction of thoracic region: Secondary | ICD-10-CM | POA: Diagnosis not present

## 2016-03-10 DIAGNOSIS — M9901 Segmental and somatic dysfunction of cervical region: Secondary | ICD-10-CM | POA: Diagnosis not present

## 2016-04-04 DIAGNOSIS — M47816 Spondylosis without myelopathy or radiculopathy, lumbar region: Secondary | ICD-10-CM | POA: Diagnosis not present

## 2016-04-04 DIAGNOSIS — J019 Acute sinusitis, unspecified: Secondary | ICD-10-CM | POA: Diagnosis not present

## 2016-04-04 DIAGNOSIS — Z6828 Body mass index (BMI) 28.0-28.9, adult: Secondary | ICD-10-CM | POA: Diagnosis not present

## 2016-04-04 DIAGNOSIS — J440 Chronic obstructive pulmonary disease with acute lower respiratory infection: Secondary | ICD-10-CM | POA: Diagnosis not present

## 2016-04-10 DIAGNOSIS — J069 Acute upper respiratory infection, unspecified: Secondary | ICD-10-CM | POA: Diagnosis not present

## 2016-04-10 DIAGNOSIS — R05 Cough: Secondary | ICD-10-CM | POA: Diagnosis not present

## 2016-04-10 DIAGNOSIS — R531 Weakness: Secondary | ICD-10-CM | POA: Diagnosis not present

## 2016-04-10 DIAGNOSIS — E119 Type 2 diabetes mellitus without complications: Secondary | ICD-10-CM | POA: Diagnosis not present

## 2016-04-10 DIAGNOSIS — E1165 Type 2 diabetes mellitus with hyperglycemia: Secondary | ICD-10-CM | POA: Diagnosis not present

## 2016-04-10 DIAGNOSIS — R42 Dizziness and giddiness: Secondary | ICD-10-CM | POA: Diagnosis not present

## 2016-05-12 DIAGNOSIS — M810 Age-related osteoporosis without current pathological fracture: Secondary | ICD-10-CM | POA: Diagnosis not present

## 2016-05-26 DIAGNOSIS — E119 Type 2 diabetes mellitus without complications: Secondary | ICD-10-CM | POA: Diagnosis not present

## 2016-06-01 DIAGNOSIS — R0600 Dyspnea, unspecified: Secondary | ICD-10-CM | POA: Diagnosis not present

## 2016-06-01 DIAGNOSIS — I700 Atherosclerosis of aorta: Secondary | ICD-10-CM | POA: Diagnosis not present

## 2016-06-01 DIAGNOSIS — I2510 Atherosclerotic heart disease of native coronary artery without angina pectoris: Secondary | ICD-10-CM | POA: Diagnosis not present

## 2016-06-01 DIAGNOSIS — R635 Abnormal weight gain: Secondary | ICD-10-CM | POA: Diagnosis not present

## 2016-06-01 DIAGNOSIS — R6889 Other general symptoms and signs: Secondary | ICD-10-CM | POA: Diagnosis not present

## 2016-06-01 DIAGNOSIS — Z6830 Body mass index (BMI) 30.0-30.9, adult: Secondary | ICD-10-CM | POA: Diagnosis not present

## 2016-06-01 DIAGNOSIS — R531 Weakness: Secondary | ICD-10-CM | POA: Diagnosis not present

## 2016-06-01 DIAGNOSIS — R7989 Other specified abnormal findings of blood chemistry: Secondary | ICD-10-CM | POA: Diagnosis not present

## 2016-06-01 DIAGNOSIS — R5383 Other fatigue: Secondary | ICD-10-CM | POA: Diagnosis not present

## 2016-06-01 DIAGNOSIS — J441 Chronic obstructive pulmonary disease with (acute) exacerbation: Secondary | ICD-10-CM | POA: Diagnosis not present

## 2016-06-01 DIAGNOSIS — I517 Cardiomegaly: Secondary | ICD-10-CM | POA: Diagnosis not present

## 2016-06-01 DIAGNOSIS — R0602 Shortness of breath: Secondary | ICD-10-CM | POA: Diagnosis not present

## 2016-06-02 DIAGNOSIS — E538 Deficiency of other specified B group vitamins: Secondary | ICD-10-CM | POA: Diagnosis not present

## 2016-06-02 DIAGNOSIS — D509 Iron deficiency anemia, unspecified: Secondary | ICD-10-CM | POA: Diagnosis not present

## 2016-06-02 DIAGNOSIS — R5383 Other fatigue: Secondary | ICD-10-CM | POA: Diagnosis not present

## 2016-06-02 DIAGNOSIS — E559 Vitamin D deficiency, unspecified: Secondary | ICD-10-CM | POA: Diagnosis not present

## 2016-06-20 DIAGNOSIS — Z6831 Body mass index (BMI) 31.0-31.9, adult: Secondary | ICD-10-CM | POA: Insufficient documentation

## 2016-06-20 DIAGNOSIS — D509 Iron deficiency anemia, unspecified: Secondary | ICD-10-CM

## 2016-06-20 DIAGNOSIS — R1011 Right upper quadrant pain: Secondary | ICD-10-CM

## 2016-06-20 DIAGNOSIS — K921 Melena: Secondary | ICD-10-CM | POA: Diagnosis not present

## 2016-06-20 HISTORY — DX: Melena: K92.1

## 2016-06-20 HISTORY — DX: Right upper quadrant pain: R10.11

## 2016-06-20 HISTORY — DX: Iron deficiency anemia, unspecified: D50.9

## 2016-06-23 DIAGNOSIS — R1011 Right upper quadrant pain: Secondary | ICD-10-CM | POA: Diagnosis not present

## 2016-06-24 DIAGNOSIS — R1011 Right upper quadrant pain: Secondary | ICD-10-CM | POA: Diagnosis not present

## 2016-06-24 DIAGNOSIS — K219 Gastro-esophageal reflux disease without esophagitis: Secondary | ICD-10-CM | POA: Diagnosis not present

## 2016-06-24 DIAGNOSIS — E611 Iron deficiency: Secondary | ICD-10-CM | POA: Diagnosis not present

## 2016-06-24 DIAGNOSIS — K7460 Unspecified cirrhosis of liver: Secondary | ICD-10-CM | POA: Diagnosis not present

## 2016-06-25 DIAGNOSIS — Z6830 Body mass index (BMI) 30.0-30.9, adult: Secondary | ICD-10-CM | POA: Diagnosis not present

## 2016-06-25 DIAGNOSIS — R1031 Right lower quadrant pain: Secondary | ICD-10-CM | POA: Diagnosis not present

## 2016-06-25 DIAGNOSIS — Z9181 History of falling: Secondary | ICD-10-CM | POA: Diagnosis not present

## 2016-06-25 DIAGNOSIS — M810 Age-related osteoporosis without current pathological fracture: Secondary | ICD-10-CM | POA: Diagnosis not present

## 2016-06-25 DIAGNOSIS — I10 Essential (primary) hypertension: Secondary | ICD-10-CM | POA: Diagnosis not present

## 2016-06-25 DIAGNOSIS — J019 Acute sinusitis, unspecified: Secondary | ICD-10-CM | POA: Diagnosis not present

## 2016-06-25 DIAGNOSIS — M8589 Other specified disorders of bone density and structure, multiple sites: Secondary | ICD-10-CM | POA: Diagnosis not present

## 2016-06-25 DIAGNOSIS — E1129 Type 2 diabetes mellitus with other diabetic kidney complication: Secondary | ICD-10-CM | POA: Diagnosis not present

## 2016-06-25 DIAGNOSIS — E785 Hyperlipidemia, unspecified: Secondary | ICD-10-CM | POA: Diagnosis not present

## 2016-06-25 DIAGNOSIS — Z1389 Encounter for screening for other disorder: Secondary | ICD-10-CM | POA: Diagnosis not present

## 2016-06-25 DIAGNOSIS — Z1231 Encounter for screening mammogram for malignant neoplasm of breast: Secondary | ICD-10-CM | POA: Diagnosis not present

## 2016-06-25 DIAGNOSIS — D509 Iron deficiency anemia, unspecified: Secondary | ICD-10-CM | POA: Diagnosis not present

## 2016-06-28 DIAGNOSIS — M62830 Muscle spasm of back: Secondary | ICD-10-CM | POA: Diagnosis not present

## 2016-06-28 DIAGNOSIS — M5413 Radiculopathy, cervicothoracic region: Secondary | ICD-10-CM | POA: Diagnosis not present

## 2016-06-28 DIAGNOSIS — M9905 Segmental and somatic dysfunction of pelvic region: Secondary | ICD-10-CM | POA: Diagnosis not present

## 2016-06-28 DIAGNOSIS — M5416 Radiculopathy, lumbar region: Secondary | ICD-10-CM | POA: Diagnosis not present

## 2016-06-28 DIAGNOSIS — M9903 Segmental and somatic dysfunction of lumbar region: Secondary | ICD-10-CM | POA: Diagnosis not present

## 2016-06-28 DIAGNOSIS — M9902 Segmental and somatic dysfunction of thoracic region: Secondary | ICD-10-CM | POA: Diagnosis not present

## 2016-06-30 DIAGNOSIS — M9905 Segmental and somatic dysfunction of pelvic region: Secondary | ICD-10-CM | POA: Diagnosis not present

## 2016-06-30 DIAGNOSIS — M9902 Segmental and somatic dysfunction of thoracic region: Secondary | ICD-10-CM | POA: Diagnosis not present

## 2016-06-30 DIAGNOSIS — M62830 Muscle spasm of back: Secondary | ICD-10-CM | POA: Diagnosis not present

## 2016-06-30 DIAGNOSIS — M5413 Radiculopathy, cervicothoracic region: Secondary | ICD-10-CM | POA: Diagnosis not present

## 2016-06-30 DIAGNOSIS — M9903 Segmental and somatic dysfunction of lumbar region: Secondary | ICD-10-CM | POA: Diagnosis not present

## 2016-06-30 DIAGNOSIS — M5416 Radiculopathy, lumbar region: Secondary | ICD-10-CM | POA: Diagnosis not present

## 2016-07-05 DIAGNOSIS — M5416 Radiculopathy, lumbar region: Secondary | ICD-10-CM | POA: Diagnosis not present

## 2016-07-05 DIAGNOSIS — M9905 Segmental and somatic dysfunction of pelvic region: Secondary | ICD-10-CM | POA: Diagnosis not present

## 2016-07-05 DIAGNOSIS — M9903 Segmental and somatic dysfunction of lumbar region: Secondary | ICD-10-CM | POA: Diagnosis not present

## 2016-07-05 DIAGNOSIS — M62830 Muscle spasm of back: Secondary | ICD-10-CM | POA: Diagnosis not present

## 2016-07-05 DIAGNOSIS — M5413 Radiculopathy, cervicothoracic region: Secondary | ICD-10-CM | POA: Diagnosis not present

## 2016-07-05 DIAGNOSIS — M9902 Segmental and somatic dysfunction of thoracic region: Secondary | ICD-10-CM | POA: Diagnosis not present

## 2016-07-07 DIAGNOSIS — M9902 Segmental and somatic dysfunction of thoracic region: Secondary | ICD-10-CM | POA: Diagnosis not present

## 2016-07-07 DIAGNOSIS — M9905 Segmental and somatic dysfunction of pelvic region: Secondary | ICD-10-CM | POA: Diagnosis not present

## 2016-07-07 DIAGNOSIS — M5416 Radiculopathy, lumbar region: Secondary | ICD-10-CM | POA: Diagnosis not present

## 2016-07-07 DIAGNOSIS — M9903 Segmental and somatic dysfunction of lumbar region: Secondary | ICD-10-CM | POA: Diagnosis not present

## 2016-07-07 DIAGNOSIS — M5413 Radiculopathy, cervicothoracic region: Secondary | ICD-10-CM | POA: Diagnosis not present

## 2016-07-07 DIAGNOSIS — M62830 Muscle spasm of back: Secondary | ICD-10-CM | POA: Diagnosis not present

## 2016-07-21 DIAGNOSIS — M810 Age-related osteoporosis without current pathological fracture: Secondary | ICD-10-CM | POA: Diagnosis not present

## 2016-07-21 DIAGNOSIS — Z1231 Encounter for screening mammogram for malignant neoplasm of breast: Secondary | ICD-10-CM | POA: Diagnosis not present

## 2016-07-21 DIAGNOSIS — M8589 Other specified disorders of bone density and structure, multiple sites: Secondary | ICD-10-CM | POA: Diagnosis not present

## 2016-07-28 DIAGNOSIS — J019 Acute sinusitis, unspecified: Secondary | ICD-10-CM | POA: Diagnosis not present

## 2016-07-28 DIAGNOSIS — J440 Chronic obstructive pulmonary disease with acute lower respiratory infection: Secondary | ICD-10-CM | POA: Diagnosis not present

## 2016-07-28 DIAGNOSIS — R6889 Other general symptoms and signs: Secondary | ICD-10-CM | POA: Diagnosis not present

## 2016-08-01 DIAGNOSIS — K7460 Unspecified cirrhosis of liver: Secondary | ICD-10-CM | POA: Diagnosis not present

## 2016-08-01 DIAGNOSIS — E119 Type 2 diabetes mellitus without complications: Secondary | ICD-10-CM | POA: Diagnosis not present

## 2016-08-01 DIAGNOSIS — R1011 Right upper quadrant pain: Secondary | ICD-10-CM | POA: Diagnosis not present

## 2016-08-01 DIAGNOSIS — K269 Duodenal ulcer, unspecified as acute or chronic, without hemorrhage or perforation: Secondary | ICD-10-CM | POA: Diagnosis not present

## 2016-08-01 DIAGNOSIS — K766 Portal hypertension: Secondary | ICD-10-CM | POA: Diagnosis not present

## 2016-08-01 DIAGNOSIS — K263 Acute duodenal ulcer without hemorrhage or perforation: Secondary | ICD-10-CM | POA: Diagnosis not present

## 2016-08-01 DIAGNOSIS — K7581 Nonalcoholic steatohepatitis (NASH): Secondary | ICD-10-CM | POA: Diagnosis not present

## 2016-08-01 DIAGNOSIS — K219 Gastro-esophageal reflux disease without esophagitis: Secondary | ICD-10-CM | POA: Diagnosis not present

## 2016-08-01 DIAGNOSIS — E611 Iron deficiency: Secondary | ICD-10-CM | POA: Diagnosis not present

## 2016-08-01 DIAGNOSIS — K3189 Other diseases of stomach and duodenum: Secondary | ICD-10-CM | POA: Diagnosis not present

## 2016-08-02 DIAGNOSIS — H25812 Combined forms of age-related cataract, left eye: Secondary | ICD-10-CM | POA: Diagnosis not present

## 2016-08-02 DIAGNOSIS — H25813 Combined forms of age-related cataract, bilateral: Secondary | ICD-10-CM | POA: Diagnosis not present

## 2016-08-02 DIAGNOSIS — Z01818 Encounter for other preprocedural examination: Secondary | ICD-10-CM | POA: Diagnosis not present

## 2016-08-02 DIAGNOSIS — H2181 Floppy iris syndrome: Secondary | ICD-10-CM | POA: Diagnosis not present

## 2016-08-02 DIAGNOSIS — H5703 Miosis: Secondary | ICD-10-CM | POA: Diagnosis not present

## 2016-08-02 DIAGNOSIS — H2512 Age-related nuclear cataract, left eye: Secondary | ICD-10-CM | POA: Diagnosis not present

## 2016-08-08 DIAGNOSIS — H2511 Age-related nuclear cataract, right eye: Secondary | ICD-10-CM | POA: Diagnosis not present

## 2016-08-08 DIAGNOSIS — H2181 Floppy iris syndrome: Secondary | ICD-10-CM | POA: Diagnosis not present

## 2016-08-08 DIAGNOSIS — H25811 Combined forms of age-related cataract, right eye: Secondary | ICD-10-CM | POA: Diagnosis not present

## 2016-10-04 DIAGNOSIS — Z6830 Body mass index (BMI) 30.0-30.9, adult: Secondary | ICD-10-CM | POA: Diagnosis not present

## 2016-10-04 DIAGNOSIS — Z136 Encounter for screening for cardiovascular disorders: Secondary | ICD-10-CM | POA: Diagnosis not present

## 2016-10-04 DIAGNOSIS — Z0000 Encounter for general adult medical examination without abnormal findings: Secondary | ICD-10-CM | POA: Diagnosis not present

## 2016-10-04 DIAGNOSIS — Z9181 History of falling: Secondary | ICD-10-CM | POA: Diagnosis not present

## 2016-10-04 DIAGNOSIS — Z1389 Encounter for screening for other disorder: Secondary | ICD-10-CM | POA: Diagnosis not present

## 2016-10-04 DIAGNOSIS — E669 Obesity, unspecified: Secondary | ICD-10-CM | POA: Diagnosis not present

## 2016-10-20 DIAGNOSIS — B373 Candidiasis of vulva and vagina: Secondary | ICD-10-CM | POA: Diagnosis not present

## 2016-10-20 DIAGNOSIS — J019 Acute sinusitis, unspecified: Secondary | ICD-10-CM | POA: Diagnosis not present

## 2016-10-20 DIAGNOSIS — J441 Chronic obstructive pulmonary disease with (acute) exacerbation: Secondary | ICD-10-CM | POA: Diagnosis not present

## 2016-11-07 DIAGNOSIS — M810 Age-related osteoporosis without current pathological fracture: Secondary | ICD-10-CM | POA: Diagnosis not present

## 2016-11-11 DIAGNOSIS — M810 Age-related osteoporosis without current pathological fracture: Secondary | ICD-10-CM | POA: Diagnosis not present

## 2016-11-16 DIAGNOSIS — S6291XA Unspecified fracture of right wrist and hand, initial encounter for closed fracture: Secondary | ICD-10-CM | POA: Diagnosis not present

## 2016-11-21 DIAGNOSIS — L578 Other skin changes due to chronic exposure to nonionizing radiation: Secondary | ICD-10-CM | POA: Diagnosis not present

## 2016-11-21 DIAGNOSIS — L570 Actinic keratosis: Secondary | ICD-10-CM | POA: Diagnosis not present

## 2016-11-21 DIAGNOSIS — L820 Inflamed seborrheic keratosis: Secondary | ICD-10-CM | POA: Diagnosis not present

## 2016-11-21 DIAGNOSIS — L821 Other seborrheic keratosis: Secondary | ICD-10-CM | POA: Diagnosis not present

## 2016-11-21 DIAGNOSIS — D0461 Carcinoma in situ of skin of right upper limb, including shoulder: Secondary | ICD-10-CM | POA: Diagnosis not present

## 2016-11-21 DIAGNOSIS — M25531 Pain in right wrist: Secondary | ICD-10-CM | POA: Diagnosis not present

## 2016-12-07 DIAGNOSIS — M25531 Pain in right wrist: Secondary | ICD-10-CM | POA: Diagnosis not present

## 2016-12-29 DIAGNOSIS — M9903 Segmental and somatic dysfunction of lumbar region: Secondary | ICD-10-CM | POA: Diagnosis not present

## 2016-12-29 DIAGNOSIS — M9905 Segmental and somatic dysfunction of pelvic region: Secondary | ICD-10-CM | POA: Diagnosis not present

## 2016-12-29 DIAGNOSIS — M542 Cervicalgia: Secondary | ICD-10-CM | POA: Diagnosis not present

## 2016-12-29 DIAGNOSIS — M9901 Segmental and somatic dysfunction of cervical region: Secondary | ICD-10-CM | POA: Diagnosis not present

## 2016-12-29 DIAGNOSIS — M5416 Radiculopathy, lumbar region: Secondary | ICD-10-CM | POA: Diagnosis not present

## 2017-01-03 DIAGNOSIS — M9901 Segmental and somatic dysfunction of cervical region: Secondary | ICD-10-CM | POA: Diagnosis not present

## 2017-01-03 DIAGNOSIS — M5416 Radiculopathy, lumbar region: Secondary | ICD-10-CM | POA: Diagnosis not present

## 2017-01-03 DIAGNOSIS — M9905 Segmental and somatic dysfunction of pelvic region: Secondary | ICD-10-CM | POA: Diagnosis not present

## 2017-01-03 DIAGNOSIS — M542 Cervicalgia: Secondary | ICD-10-CM | POA: Diagnosis not present

## 2017-01-03 DIAGNOSIS — M9903 Segmental and somatic dysfunction of lumbar region: Secondary | ICD-10-CM | POA: Diagnosis not present

## 2017-01-05 DIAGNOSIS — M9903 Segmental and somatic dysfunction of lumbar region: Secondary | ICD-10-CM | POA: Diagnosis not present

## 2017-01-05 DIAGNOSIS — M5416 Radiculopathy, lumbar region: Secondary | ICD-10-CM | POA: Diagnosis not present

## 2017-01-05 DIAGNOSIS — M542 Cervicalgia: Secondary | ICD-10-CM | POA: Diagnosis not present

## 2017-01-05 DIAGNOSIS — M9905 Segmental and somatic dysfunction of pelvic region: Secondary | ICD-10-CM | POA: Diagnosis not present

## 2017-01-05 DIAGNOSIS — M9901 Segmental and somatic dysfunction of cervical region: Secondary | ICD-10-CM | POA: Diagnosis not present

## 2017-01-17 DIAGNOSIS — M9905 Segmental and somatic dysfunction of pelvic region: Secondary | ICD-10-CM | POA: Diagnosis not present

## 2017-01-17 DIAGNOSIS — M9901 Segmental and somatic dysfunction of cervical region: Secondary | ICD-10-CM | POA: Diagnosis not present

## 2017-01-17 DIAGNOSIS — M5416 Radiculopathy, lumbar region: Secondary | ICD-10-CM | POA: Diagnosis not present

## 2017-01-17 DIAGNOSIS — M542 Cervicalgia: Secondary | ICD-10-CM | POA: Diagnosis not present

## 2017-01-17 DIAGNOSIS — M9903 Segmental and somatic dysfunction of lumbar region: Secondary | ICD-10-CM | POA: Diagnosis not present

## 2017-01-19 DIAGNOSIS — M9905 Segmental and somatic dysfunction of pelvic region: Secondary | ICD-10-CM | POA: Diagnosis not present

## 2017-01-19 DIAGNOSIS — M9903 Segmental and somatic dysfunction of lumbar region: Secondary | ICD-10-CM | POA: Diagnosis not present

## 2017-01-19 DIAGNOSIS — M5416 Radiculopathy, lumbar region: Secondary | ICD-10-CM | POA: Diagnosis not present

## 2017-01-19 DIAGNOSIS — M9901 Segmental and somatic dysfunction of cervical region: Secondary | ICD-10-CM | POA: Diagnosis not present

## 2017-01-19 DIAGNOSIS — M542 Cervicalgia: Secondary | ICD-10-CM | POA: Diagnosis not present

## 2017-04-03 DIAGNOSIS — H2513 Age-related nuclear cataract, bilateral: Secondary | ICD-10-CM | POA: Diagnosis not present

## 2017-04-29 DIAGNOSIS — J019 Acute sinusitis, unspecified: Secondary | ICD-10-CM | POA: Diagnosis not present

## 2017-04-29 DIAGNOSIS — K2900 Acute gastritis without bleeding: Secondary | ICD-10-CM | POA: Diagnosis not present

## 2017-04-29 DIAGNOSIS — D509 Iron deficiency anemia, unspecified: Secondary | ICD-10-CM | POA: Diagnosis not present

## 2017-04-29 DIAGNOSIS — J440 Chronic obstructive pulmonary disease with acute lower respiratory infection: Secondary | ICD-10-CM | POA: Diagnosis not present

## 2017-04-29 DIAGNOSIS — E559 Vitamin D deficiency, unspecified: Secondary | ICD-10-CM | POA: Diagnosis not present

## 2017-04-29 DIAGNOSIS — J441 Chronic obstructive pulmonary disease with (acute) exacerbation: Secondary | ICD-10-CM | POA: Diagnosis not present

## 2017-05-26 DIAGNOSIS — M810 Age-related osteoporosis without current pathological fracture: Secondary | ICD-10-CM | POA: Diagnosis not present

## 2017-05-26 DIAGNOSIS — E1129 Type 2 diabetes mellitus with other diabetic kidney complication: Secondary | ICD-10-CM | POA: Diagnosis not present

## 2017-05-26 DIAGNOSIS — Z6829 Body mass index (BMI) 29.0-29.9, adult: Secondary | ICD-10-CM | POA: Diagnosis not present

## 2017-05-26 DIAGNOSIS — Z23 Encounter for immunization: Secondary | ICD-10-CM | POA: Diagnosis not present

## 2017-05-26 DIAGNOSIS — E559 Vitamin D deficiency, unspecified: Secondary | ICD-10-CM | POA: Diagnosis not present

## 2017-05-26 DIAGNOSIS — Z1231 Encounter for screening mammogram for malignant neoplasm of breast: Secondary | ICD-10-CM | POA: Diagnosis not present

## 2017-05-26 DIAGNOSIS — E785 Hyperlipidemia, unspecified: Secondary | ICD-10-CM | POA: Diagnosis not present

## 2017-05-26 DIAGNOSIS — I10 Essential (primary) hypertension: Secondary | ICD-10-CM | POA: Diagnosis not present

## 2017-05-26 DIAGNOSIS — K7460 Unspecified cirrhosis of liver: Secondary | ICD-10-CM | POA: Diagnosis not present

## 2017-05-26 DIAGNOSIS — D509 Iron deficiency anemia, unspecified: Secondary | ICD-10-CM | POA: Diagnosis not present

## 2017-05-26 DIAGNOSIS — K625 Hemorrhage of anus and rectum: Secondary | ICD-10-CM | POA: Diagnosis not present

## 2017-05-29 DIAGNOSIS — I10 Essential (primary) hypertension: Secondary | ICD-10-CM | POA: Diagnosis not present

## 2017-05-29 DIAGNOSIS — F419 Anxiety disorder, unspecified: Secondary | ICD-10-CM | POA: Diagnosis not present

## 2017-05-29 DIAGNOSIS — J449 Chronic obstructive pulmonary disease, unspecified: Secondary | ICD-10-CM | POA: Diagnosis not present

## 2017-05-29 DIAGNOSIS — D120 Benign neoplasm of cecum: Secondary | ICD-10-CM | POA: Diagnosis not present

## 2017-05-29 DIAGNOSIS — K5730 Diverticulosis of large intestine without perforation or abscess without bleeding: Secondary | ICD-10-CM | POA: Diagnosis not present

## 2017-05-29 DIAGNOSIS — Z794 Long term (current) use of insulin: Secondary | ICD-10-CM | POA: Diagnosis not present

## 2017-05-29 DIAGNOSIS — K635 Polyp of colon: Secondary | ICD-10-CM | POA: Diagnosis not present

## 2017-05-29 DIAGNOSIS — E785 Hyperlipidemia, unspecified: Secondary | ICD-10-CM | POA: Diagnosis not present

## 2017-05-29 DIAGNOSIS — E119 Type 2 diabetes mellitus without complications: Secondary | ICD-10-CM | POA: Diagnosis not present

## 2017-05-29 DIAGNOSIS — Z9049 Acquired absence of other specified parts of digestive tract: Secondary | ICD-10-CM | POA: Diagnosis not present

## 2017-05-29 DIAGNOSIS — K648 Other hemorrhoids: Secondary | ICD-10-CM | POA: Diagnosis not present

## 2017-05-29 DIAGNOSIS — J45909 Unspecified asthma, uncomplicated: Secondary | ICD-10-CM | POA: Diagnosis not present

## 2017-05-29 DIAGNOSIS — K219 Gastro-esophageal reflux disease without esophagitis: Secondary | ICD-10-CM | POA: Diagnosis not present

## 2017-05-29 DIAGNOSIS — Z79899 Other long term (current) drug therapy: Secondary | ICD-10-CM | POA: Diagnosis not present

## 2017-05-29 DIAGNOSIS — D122 Benign neoplasm of ascending colon: Secondary | ICD-10-CM | POA: Diagnosis not present

## 2017-05-29 DIAGNOSIS — D123 Benign neoplasm of transverse colon: Secondary | ICD-10-CM | POA: Diagnosis not present

## 2017-05-29 DIAGNOSIS — F329 Major depressive disorder, single episode, unspecified: Secondary | ICD-10-CM | POA: Diagnosis not present

## 2017-05-29 DIAGNOSIS — K625 Hemorrhage of anus and rectum: Secondary | ICD-10-CM | POA: Diagnosis not present

## 2017-05-29 DIAGNOSIS — M810 Age-related osteoporosis without current pathological fracture: Secondary | ICD-10-CM | POA: Diagnosis not present

## 2017-05-29 DIAGNOSIS — E559 Vitamin D deficiency, unspecified: Secondary | ICD-10-CM | POA: Diagnosis not present

## 2017-06-12 DIAGNOSIS — B373 Candidiasis of vulva and vagina: Secondary | ICD-10-CM | POA: Diagnosis not present

## 2017-06-12 DIAGNOSIS — J440 Chronic obstructive pulmonary disease with acute lower respiratory infection: Secondary | ICD-10-CM | POA: Diagnosis not present

## 2017-06-12 DIAGNOSIS — Z6829 Body mass index (BMI) 29.0-29.9, adult: Secondary | ICD-10-CM | POA: Diagnosis not present

## 2017-07-21 DIAGNOSIS — Z1231 Encounter for screening mammogram for malignant neoplasm of breast: Secondary | ICD-10-CM | POA: Diagnosis not present

## 2017-08-02 DIAGNOSIS — J440 Chronic obstructive pulmonary disease with acute lower respiratory infection: Secondary | ICD-10-CM | POA: Diagnosis not present

## 2017-08-02 DIAGNOSIS — R112 Nausea with vomiting, unspecified: Secondary | ICD-10-CM | POA: Diagnosis not present

## 2017-08-31 DIAGNOSIS — E785 Hyperlipidemia, unspecified: Secondary | ICD-10-CM | POA: Diagnosis not present

## 2017-08-31 DIAGNOSIS — E1129 Type 2 diabetes mellitus with other diabetic kidney complication: Secondary | ICD-10-CM | POA: Diagnosis not present

## 2017-08-31 DIAGNOSIS — I10 Essential (primary) hypertension: Secondary | ICD-10-CM | POA: Diagnosis not present

## 2017-08-31 DIAGNOSIS — M79661 Pain in right lower leg: Secondary | ICD-10-CM | POA: Diagnosis not present

## 2017-08-31 DIAGNOSIS — D509 Iron deficiency anemia, unspecified: Secondary | ICD-10-CM | POA: Diagnosis not present

## 2017-08-31 DIAGNOSIS — Z6830 Body mass index (BMI) 30.0-30.9, adult: Secondary | ICD-10-CM | POA: Diagnosis not present

## 2017-08-31 DIAGNOSIS — E559 Vitamin D deficiency, unspecified: Secondary | ICD-10-CM | POA: Diagnosis not present

## 2017-08-31 DIAGNOSIS — M810 Age-related osteoporosis without current pathological fracture: Secondary | ICD-10-CM | POA: Diagnosis not present

## 2017-09-01 DIAGNOSIS — M7989 Other specified soft tissue disorders: Secondary | ICD-10-CM | POA: Diagnosis not present

## 2017-09-01 DIAGNOSIS — M79661 Pain in right lower leg: Secondary | ICD-10-CM | POA: Diagnosis not present

## 2017-10-03 DIAGNOSIS — Z136 Encounter for screening for cardiovascular disorders: Secondary | ICD-10-CM | POA: Diagnosis not present

## 2017-10-03 DIAGNOSIS — Z1331 Encounter for screening for depression: Secondary | ICD-10-CM | POA: Diagnosis not present

## 2017-10-03 DIAGNOSIS — Z1231 Encounter for screening mammogram for malignant neoplasm of breast: Secondary | ICD-10-CM | POA: Diagnosis not present

## 2017-10-03 DIAGNOSIS — E785 Hyperlipidemia, unspecified: Secondary | ICD-10-CM | POA: Diagnosis not present

## 2017-10-03 DIAGNOSIS — Z0000 Encounter for general adult medical examination without abnormal findings: Secondary | ICD-10-CM | POA: Diagnosis not present

## 2017-10-03 DIAGNOSIS — Z9181 History of falling: Secondary | ICD-10-CM | POA: Diagnosis not present

## 2017-10-19 DIAGNOSIS — M79672 Pain in left foot: Secondary | ICD-10-CM | POA: Diagnosis not present

## 2017-11-08 DIAGNOSIS — J309 Allergic rhinitis, unspecified: Secondary | ICD-10-CM | POA: Diagnosis not present

## 2017-11-13 DIAGNOSIS — M25561 Pain in right knee: Secondary | ICD-10-CM | POA: Diagnosis not present

## 2017-11-16 DIAGNOSIS — S83281A Other tear of lateral meniscus, current injury, right knee, initial encounter: Secondary | ICD-10-CM | POA: Diagnosis not present

## 2017-11-16 DIAGNOSIS — M25561 Pain in right knee: Secondary | ICD-10-CM | POA: Diagnosis not present

## 2017-11-16 DIAGNOSIS — W19XXXA Unspecified fall, initial encounter: Secondary | ICD-10-CM | POA: Diagnosis not present

## 2017-11-16 DIAGNOSIS — M1711 Unilateral primary osteoarthritis, right knee: Secondary | ICD-10-CM | POA: Diagnosis not present

## 2017-11-20 DIAGNOSIS — S8000XA Contusion of unspecified knee, initial encounter: Secondary | ICD-10-CM | POA: Diagnosis not present

## 2017-11-20 DIAGNOSIS — M25561 Pain in right knee: Secondary | ICD-10-CM | POA: Diagnosis not present

## 2017-12-04 DIAGNOSIS — Z6830 Body mass index (BMI) 30.0-30.9, adult: Secondary | ICD-10-CM | POA: Diagnosis not present

## 2017-12-04 DIAGNOSIS — I10 Essential (primary) hypertension: Secondary | ICD-10-CM | POA: Diagnosis not present

## 2017-12-04 DIAGNOSIS — E559 Vitamin D deficiency, unspecified: Secondary | ICD-10-CM | POA: Diagnosis not present

## 2017-12-04 DIAGNOSIS — E1129 Type 2 diabetes mellitus with other diabetic kidney complication: Secondary | ICD-10-CM | POA: Diagnosis not present

## 2017-12-04 DIAGNOSIS — D509 Iron deficiency anemia, unspecified: Secondary | ICD-10-CM | POA: Diagnosis not present

## 2017-12-04 DIAGNOSIS — E785 Hyperlipidemia, unspecified: Secondary | ICD-10-CM | POA: Diagnosis not present

## 2017-12-06 DIAGNOSIS — M25561 Pain in right knee: Secondary | ICD-10-CM | POA: Diagnosis not present

## 2017-12-11 DIAGNOSIS — R2689 Other abnormalities of gait and mobility: Secondary | ICD-10-CM | POA: Diagnosis not present

## 2017-12-11 DIAGNOSIS — M25561 Pain in right knee: Secondary | ICD-10-CM | POA: Diagnosis not present

## 2017-12-14 DIAGNOSIS — M25561 Pain in right knee: Secondary | ICD-10-CM | POA: Diagnosis not present

## 2017-12-14 DIAGNOSIS — L578 Other skin changes due to chronic exposure to nonionizing radiation: Secondary | ICD-10-CM | POA: Diagnosis not present

## 2017-12-14 DIAGNOSIS — R2689 Other abnormalities of gait and mobility: Secondary | ICD-10-CM | POA: Diagnosis not present

## 2017-12-14 DIAGNOSIS — L821 Other seborrheic keratosis: Secondary | ICD-10-CM | POA: Diagnosis not present

## 2017-12-14 DIAGNOSIS — L570 Actinic keratosis: Secondary | ICD-10-CM | POA: Diagnosis not present

## 2017-12-20 DIAGNOSIS — M25561 Pain in right knee: Secondary | ICD-10-CM | POA: Diagnosis not present

## 2017-12-20 DIAGNOSIS — R2689 Other abnormalities of gait and mobility: Secondary | ICD-10-CM | POA: Diagnosis not present

## 2017-12-22 DIAGNOSIS — M25561 Pain in right knee: Secondary | ICD-10-CM | POA: Diagnosis not present

## 2017-12-22 DIAGNOSIS — R2689 Other abnormalities of gait and mobility: Secondary | ICD-10-CM | POA: Diagnosis not present

## 2017-12-25 DIAGNOSIS — R2689 Other abnormalities of gait and mobility: Secondary | ICD-10-CM | POA: Diagnosis not present

## 2017-12-25 DIAGNOSIS — M25561 Pain in right knee: Secondary | ICD-10-CM | POA: Diagnosis not present

## 2017-12-27 DIAGNOSIS — R2689 Other abnormalities of gait and mobility: Secondary | ICD-10-CM | POA: Diagnosis not present

## 2017-12-27 DIAGNOSIS — M25561 Pain in right knee: Secondary | ICD-10-CM | POA: Diagnosis not present

## 2018-01-02 DIAGNOSIS — R2689 Other abnormalities of gait and mobility: Secondary | ICD-10-CM | POA: Diagnosis not present

## 2018-01-02 DIAGNOSIS — M25561 Pain in right knee: Secondary | ICD-10-CM | POA: Diagnosis not present

## 2018-01-04 DIAGNOSIS — R2689 Other abnormalities of gait and mobility: Secondary | ICD-10-CM | POA: Diagnosis not present

## 2018-01-04 DIAGNOSIS — M25561 Pain in right knee: Secondary | ICD-10-CM | POA: Diagnosis not present

## 2018-01-08 DIAGNOSIS — R2689 Other abnormalities of gait and mobility: Secondary | ICD-10-CM | POA: Diagnosis not present

## 2018-01-08 DIAGNOSIS — M25561 Pain in right knee: Secondary | ICD-10-CM | POA: Diagnosis not present

## 2018-01-11 DIAGNOSIS — R2689 Other abnormalities of gait and mobility: Secondary | ICD-10-CM | POA: Diagnosis not present

## 2018-01-11 DIAGNOSIS — M25561 Pain in right knee: Secondary | ICD-10-CM | POA: Diagnosis not present

## 2018-01-18 DIAGNOSIS — M25561 Pain in right knee: Secondary | ICD-10-CM | POA: Diagnosis not present

## 2018-01-18 DIAGNOSIS — R2689 Other abnormalities of gait and mobility: Secondary | ICD-10-CM | POA: Diagnosis not present

## 2018-01-24 DIAGNOSIS — R2689 Other abnormalities of gait and mobility: Secondary | ICD-10-CM | POA: Diagnosis not present

## 2018-01-24 DIAGNOSIS — M25561 Pain in right knee: Secondary | ICD-10-CM | POA: Diagnosis not present

## 2018-01-26 DIAGNOSIS — M25561 Pain in right knee: Secondary | ICD-10-CM | POA: Diagnosis not present

## 2018-01-26 DIAGNOSIS — R2689 Other abnormalities of gait and mobility: Secondary | ICD-10-CM | POA: Diagnosis not present

## 2018-01-31 DIAGNOSIS — M25561 Pain in right knee: Secondary | ICD-10-CM | POA: Diagnosis not present

## 2018-03-08 DIAGNOSIS — M810 Age-related osteoporosis without current pathological fracture: Secondary | ICD-10-CM | POA: Diagnosis not present

## 2018-03-08 DIAGNOSIS — E785 Hyperlipidemia, unspecified: Secondary | ICD-10-CM | POA: Diagnosis not present

## 2018-03-08 DIAGNOSIS — E1129 Type 2 diabetes mellitus with other diabetic kidney complication: Secondary | ICD-10-CM | POA: Diagnosis not present

## 2018-03-08 DIAGNOSIS — D509 Iron deficiency anemia, unspecified: Secondary | ICD-10-CM | POA: Diagnosis not present

## 2018-03-08 DIAGNOSIS — Z1339 Encounter for screening examination for other mental health and behavioral disorders: Secondary | ICD-10-CM | POA: Diagnosis not present

## 2018-03-08 DIAGNOSIS — E559 Vitamin D deficiency, unspecified: Secondary | ICD-10-CM | POA: Diagnosis not present

## 2018-04-03 DIAGNOSIS — H2513 Age-related nuclear cataract, bilateral: Secondary | ICD-10-CM | POA: Diagnosis not present

## 2018-04-24 DIAGNOSIS — M79604 Pain in right leg: Secondary | ICD-10-CM | POA: Diagnosis not present

## 2018-05-08 DIAGNOSIS — D485 Neoplasm of uncertain behavior of skin: Secondary | ICD-10-CM | POA: Diagnosis not present

## 2018-05-08 DIAGNOSIS — L820 Inflamed seborrheic keratosis: Secondary | ICD-10-CM | POA: Diagnosis not present

## 2018-06-15 DIAGNOSIS — Z23 Encounter for immunization: Secondary | ICD-10-CM | POA: Diagnosis not present

## 2018-06-15 DIAGNOSIS — E1129 Type 2 diabetes mellitus with other diabetic kidney complication: Secondary | ICD-10-CM | POA: Diagnosis not present

## 2018-06-15 DIAGNOSIS — M810 Age-related osteoporosis without current pathological fracture: Secondary | ICD-10-CM | POA: Diagnosis not present

## 2018-06-15 DIAGNOSIS — E559 Vitamin D deficiency, unspecified: Secondary | ICD-10-CM | POA: Diagnosis not present

## 2018-06-15 DIAGNOSIS — D509 Iron deficiency anemia, unspecified: Secondary | ICD-10-CM | POA: Diagnosis not present

## 2018-06-15 DIAGNOSIS — R079 Chest pain, unspecified: Secondary | ICD-10-CM | POA: Diagnosis not present

## 2018-06-15 DIAGNOSIS — E785 Hyperlipidemia, unspecified: Secondary | ICD-10-CM | POA: Diagnosis not present

## 2018-06-29 DIAGNOSIS — D509 Iron deficiency anemia, unspecified: Secondary | ICD-10-CM | POA: Diagnosis not present

## 2018-07-02 ENCOUNTER — Encounter: Payer: Self-pay | Admitting: Cardiology

## 2018-07-02 ENCOUNTER — Ambulatory Visit (INDEPENDENT_AMBULATORY_CARE_PROVIDER_SITE_OTHER): Payer: Medicare Other | Admitting: Cardiology

## 2018-07-02 VITALS — BP 146/66 | HR 94 | Ht 63.0 in | Wt 181.8 lb

## 2018-07-02 DIAGNOSIS — E088 Diabetes mellitus due to underlying condition with unspecified complications: Secondary | ICD-10-CM

## 2018-07-02 DIAGNOSIS — D649 Anemia, unspecified: Secondary | ICD-10-CM

## 2018-07-02 DIAGNOSIS — R0609 Other forms of dyspnea: Secondary | ICD-10-CM | POA: Diagnosis not present

## 2018-07-02 DIAGNOSIS — I10 Essential (primary) hypertension: Secondary | ICD-10-CM

## 2018-07-02 DIAGNOSIS — R0600 Dyspnea, unspecified: Secondary | ICD-10-CM

## 2018-07-02 HISTORY — DX: Diabetes mellitus due to underlying condition with unspecified complications: E08.8

## 2018-07-02 HISTORY — DX: Anemia, unspecified: D64.9

## 2018-07-02 HISTORY — DX: Essential (primary) hypertension: I10

## 2018-07-02 NOTE — Patient Instructions (Signed)
Medication Instructions:  Your physician recommends that you continue on your current medications as directed. Please refer to the Current Medication list given to you today.  If you need a refill on your cardiac medications before your next appointment, please call your pharmacy.   Lab work: None  If you have labs (blood work) drawn today and your tests are completely normal, you will receive your results only by: Marland Kitchen MyChart Message (if you have MyChart) OR . A paper copy in the mail If you have any lab test that is abnormal or we need to change your treatment, we will call you to review the results.  Testing/Procedures: Your physician has requested that you have an echocardiogram. Echocardiography is a painless test that uses sound waves to create images of your heart. It provides your doctor with information about the size and shape of your heart and how well your heart's chambers and valves are working. This procedure takes approximately one hour. There are no restrictions for this procedure.  Follow-Up: At [facility], you and your health needs are our priority.  As part of our continuing mission to provide you with exceptional heart care, we have created designated Provider Care Teams.  These Care Teams include your primary Cardiologist (physician) and Advanced Practice Providers (APPs -  Physician Assistants and Nurse Practitioners) who all work together to provide you with the care you need, when you need it.  You will need a follow up appointment in 2 months.  Please call our office 2 months in advance to schedule this appointment.  You may see another member of our [employer] Provider Team in [place]: Jenne Campus, MD . Shirlee More, MD  Any Other Special Instructions Will Be Listed Below (If Applicable).

## 2018-07-02 NOTE — Progress Notes (Signed)
Cardiology Office Note:    Date:  07/02/2018   ID:  [name], DOB [birth year], MRN [id number]  PCP:  Nicoletta Dress, MD  Cardiologist:  Jenean Lindau, MD   Referring MD: Nicoletta Dress, MD    ASSESSMENT:    1. DOE (dyspnea on exertion)   2. Essential hypertension   3. Diabetes mellitus due to underlying condition with unspecified complications ([place])   4. Anemia, unspecified type    PLAN:    In order of problems listed above:  1. Primary prevention stressed with the patient.  Importance of compliance with diet and medication stressed and she vocalized understanding. 2. Her blood pressure is stable.  Diet was discussed with dyslipidemia and diabetes mellitus.  Weight reduction was stressed. 3. I mentioned to the patient that in view of significant anemia at this time I will hold off from any significant evaluation for coronary artery disease.  I would like her to be treated for her anemia first and that to be stabilized and then reassess her symptoms 4. Echocardiogram will be done to assess murmur heard on auscultation. 5. Patient will be seen in follow-up appointment in 2 months or earlier if the patient has any concerns 6. She knows to go to the nearest emergency room for any significant concerns.   Medication Adjustments/Labs and Tests Ordered: Current medicines are reviewed at length with the patient today.  Concerns regarding medicines are outlined above.  Orders Placed This Encounter  Procedures  . ECHOCARDIOGRAM COMPLETE   No orders of the defined types were placed in this encounter.    History of Present Illness:    [name] is a 67 y.o. female who is being seen today for the evaluation of dyspnea on exertion and fatigue  at the request of Nicoletta Dress, MD.  Patient is a pleasant 67 year old female.  She has past medical history of essential hypertension and diabetes mellitus.  She saw her primary care physician for shortness of breath and  initially was referred here.  She mentions to me that she got a quick appointment with the hematologist and was found to be significantly anemic and is being considered for iron transfusions.  She is taking oral ferrous sulfate but tells me that this has not been working as expected and therefore intravenous infusions are being contemplated.  No chest pain orthopnea or PND.  At the time of my evaluation, the patient is alert awake oriented and in no distress.  Past Medical History:  Diagnosis Date  . Diabetes mellitus without complication ([place])   . Hyperlipidemia   . Hypertension       Current Medications: Current Meds  Medication Sig  . colesevelam (WELCHOL) 625 MG tablet Take 1,875 mg by mouth 2 (two) times daily.  . ferrous sulfate 325 (65 FE) MG tablet Take 325 mg by mouth 3 (three) times daily.  . furosemide (LASIX) 40 MG tablet Take 1 tablet by mouth daily.  . insulin aspart (NOVOLOG) 100 UNIT/ML FlexPen Inject 30 Units into the skin 2 (two) times daily.  Marland Kitchen LEVEMIR FLEXTOUCH 100 UNIT/ML Pen Inject 40 Units into the skin 2 (two) times daily.  Marland Kitchen liraglutide (VICTOZA) 18 MG/3ML SOPN Inject 1.8 mg into the skin daily.   Marland Kitchen lisinopril (PRINIVIL,ZESTRIL) 40 MG tablet Take 1 tablet by mouth daily.  . metFORMIN (GLUCOPHAGE-XR) 500 MG 24 hr tablet Take 500 mg by mouth 2 (two) times daily with a meal.  . omeprazole (PRILOSEC) 20 MG capsule  Take 1 capsule by mouth daily.  . potassium chloride (MICRO-K) 10 MEQ CR capsule Take 1 capsule by mouth daily.  . pregabalin (LYRICA) 75 MG capsule Take 75 mg by mouth at bedtime.  Marland Kitchen rOPINIRole (REQUIP) 2 MG tablet Take 1 tablet by mouth at bedtime.  . Vitamin D, Ergocalciferol, (DRISDOL) 1.25 MG (50000 UT) CAPS capsule Take 1 capsule by mouth once a week.     Allergies:   Patient has no known allergies.   Social History   Socioeconomic History  . Marital status: Divorced    Spouse name: Not on file  . Number of children: Not on file  . Years of  education: Not on file  . Highest education level: Not on file  Occupational History  . Not on file  Social Needs  . Financial resource strain: Not on file  . Food insecurity:    Worry: Not on file    Inability: Not on file  . Transportation needs:    Medical: Not on file    Non-medical: Not on file  Tobacco Use  . Smoking status: Former Research scientist (life sciences)  . Smokeless tobacco: Never Used  . Tobacco comment: Social in 25 and 24  Substance and Sexual Activity  . Alcohol use: Never    Frequency: Never  . Drug use: Never  . Sexual activity: Not on file  Lifestyle  . Physical activity:    Days per week: Not on file    Minutes per session: Not on file  . Stress: Not on file  Relationships  . Social connections:    Talks on phone: Not on file    Gets together: Not on file    Attends religious service: Not on file    Active member of club or organization: Not on file    Attends meetings of clubs or organizations: Not on file    Relationship status: Not on file  Other Topics Concern  . Not on file  Social History Narrative  . Not on file     Family History: The patient's family history includes Cancer in her father; Heart disease in her mother.  ROS:   Please see the history of present illness.    All other systems reviewed and are negative.  EKGs/Labs/Other Studies Reviewed:    The following studies were reviewed today: I discussed my findings with the patient at extensive length.   Recent Labs: No results found for requested labs within last 8760 hours.  Recent Lipid Panel No results found for: CHOL, TRIG, HDL, CHOLHDL, VLDL, LDLCALC, LDLDIRECT  Physical Exam:    VS:  BP (!) 146/66   Pulse 94   Ht 5\' 3"  (1.6 m)   Wt 181 lb 12.8 oz (82.5 kg)   SpO2 95%   BMI 32.20 kg/m     Wt Readings from Last 3 Encounters:  07/02/18 181 lb 12.8 oz (82.5 kg)     GEN: Patient is in no acute distress HEENT: Normal NECK: No JVD; No carotid bruits LYMPHATICS: No  lymphadenopathy CARDIAC: S1 S2 regular, 2/6 systolic murmur at the apex. RESPIRATORY:  Clear to auscultation without rales, wheezing or rhonchi  ABDOMEN: Soft, non-tender, non-distended MUSCULOSKELETAL:  No edema; No deformity  SKIN: Warm and dry NEUROLOGIC:  Alert and oriented x 3 PSYCHIATRIC:  Normal affect    Signed, Jenean Lindau, MD  07/02/2018 9:53 AM    [place]

## 2018-07-05 DIAGNOSIS — D509 Iron deficiency anemia, unspecified: Secondary | ICD-10-CM | POA: Diagnosis not present

## 2018-07-12 DIAGNOSIS — D509 Iron deficiency anemia, unspecified: Secondary | ICD-10-CM | POA: Diagnosis not present

## 2018-07-19 DIAGNOSIS — J440 Chronic obstructive pulmonary disease with acute lower respiratory infection: Secondary | ICD-10-CM | POA: Diagnosis not present

## 2018-07-19 DIAGNOSIS — R6889 Other general symptoms and signs: Secondary | ICD-10-CM | POA: Diagnosis not present

## 2018-07-19 DIAGNOSIS — B373 Candidiasis of vulva and vagina: Secondary | ICD-10-CM | POA: Diagnosis not present

## 2018-07-27 DIAGNOSIS — Z1231 Encounter for screening mammogram for malignant neoplasm of breast: Secondary | ICD-10-CM | POA: Diagnosis not present

## 2018-07-27 DIAGNOSIS — M8589 Other specified disorders of bone density and structure, multiple sites: Secondary | ICD-10-CM | POA: Diagnosis not present

## 2018-07-27 DIAGNOSIS — M810 Age-related osteoporosis without current pathological fracture: Secondary | ICD-10-CM | POA: Diagnosis not present

## 2018-08-10 DIAGNOSIS — Z79899 Other long term (current) drug therapy: Secondary | ICD-10-CM | POA: Diagnosis not present

## 2018-08-10 DIAGNOSIS — Z862 Personal history of diseases of the blood and blood-forming organs and certain disorders involving the immune mechanism: Secondary | ICD-10-CM | POA: Diagnosis not present

## 2018-08-10 DIAGNOSIS — D509 Iron deficiency anemia, unspecified: Secondary | ICD-10-CM | POA: Diagnosis not present

## 2018-08-10 DIAGNOSIS — R5383 Other fatigue: Secondary | ICD-10-CM | POA: Diagnosis not present

## 2018-08-14 ENCOUNTER — Ambulatory Visit (INDEPENDENT_AMBULATORY_CARE_PROVIDER_SITE_OTHER): Payer: Medicare Other

## 2018-08-14 DIAGNOSIS — R0609 Other forms of dyspnea: Secondary | ICD-10-CM | POA: Diagnosis not present

## 2018-08-14 NOTE — Progress Notes (Signed)
Complete echocardiogram has been performed.  Jimmy Rajohn Henery RDCS, RVT 

## 2018-08-28 DIAGNOSIS — J019 Acute sinusitis, unspecified: Secondary | ICD-10-CM | POA: Diagnosis not present

## 2018-08-28 DIAGNOSIS — D509 Iron deficiency anemia, unspecified: Secondary | ICD-10-CM | POA: Diagnosis not present

## 2018-09-03 ENCOUNTER — Encounter: Payer: Self-pay | Admitting: Cardiology

## 2018-09-03 ENCOUNTER — Ambulatory Visit (INDEPENDENT_AMBULATORY_CARE_PROVIDER_SITE_OTHER): Payer: Medicare Other | Admitting: Cardiology

## 2018-09-03 VITALS — BP 148/68 | HR 82 | Ht 63.0 in | Wt 178.0 lb

## 2018-09-03 DIAGNOSIS — I10 Essential (primary) hypertension: Secondary | ICD-10-CM | POA: Diagnosis not present

## 2018-09-03 DIAGNOSIS — R0789 Other chest pain: Secondary | ICD-10-CM

## 2018-09-03 DIAGNOSIS — E088 Diabetes mellitus due to underlying condition with unspecified complications: Secondary | ICD-10-CM

## 2018-09-03 DIAGNOSIS — I350 Nonrheumatic aortic (valve) stenosis: Secondary | ICD-10-CM | POA: Insufficient documentation

## 2018-09-03 HISTORY — DX: Nonrheumatic aortic (valve) stenosis: I35.0

## 2018-09-03 HISTORY — DX: Other chest pain: R07.89

## 2018-09-03 NOTE — Patient Instructions (Signed)
Medication Instructions:  Your physician recommends that you continue on your current medications as directed. Please refer to the Current Medication list given to you today.  If you need a refill on your cardiac medications before your next appointment, please call your pharmacy.   Lab work: None.  If you have labs (blood work) drawn today and your tests are completely normal, you will receive your results only by: [name] MyChart Message (if you have MyChart) OR . A paper copy in the mail If you have any lab test that is abnormal or we need to change your treatment, we will call you to review the results.  Testing/Procedures: Your physician has requested that you have a lexiscan myoview. For further information please visit [email or web address]. Please follow instruction sheet, as given.    Follow-Up: At [facility], you and your health needs are our priority.  As part of our continuing mission to provide you with exceptional heart care, we have created designated Provider Care Teams.  These Care Teams include your primary Cardiologist (physician) and Advanced Practice Providers (APPs -  Physician Assistants and Nurse Practitioners) who all work together to provide you with the care you need, when you need it. . You will need a follow up appointment in 6 months with Dr. [name].   Any Other Special Instructions Will Be Listed Below (If Applicable).   Cardiac Nuclear Scan A cardiac nuclear scan is a test that measures blood flow to the heart when a person is resting and when he or she is exercising. The test looks for problems such as:  Not enough blood reaching a portion of the heart.  The heart muscle not working normally. You may need this test if:  You have heart disease.  You have had abnormal lab results.  You have had heart surgery or a balloon procedure to open up blocked arteries (angioplasty).  You have chest pain.  You have shortness of breath. In this test, a  radioactive dye (tracer) is injected into your bloodstream. After the tracer has traveled to your heart, an imaging device is used to measure how much of the tracer is absorbed by or distributed to various areas of your heart. This procedure is usually done at a hospital and takes 2-4 hours. Tell a health care provider about:  Any allergies you have.  All medicines you are taking, including vitamins, herbs, eye drops, creams, and over-the-counter medicines.  Any problems you or family members have had with anesthetic medicines.  Any blood disorders you have.  Any surgeries you have had.  Any medical conditions you have.  Whether you are pregnant or may be pregnant. What are the risks? Generally, this is a safe procedure. However, problems may occur, including:  Serious chest pain and heart attack. This is only a risk if the stress portion of the test is done.  Rapid heartbeat.  Sensation of warmth in your chest. This usually passes quickly.  Allergic reaction to the tracer. What happens before the procedure?  Ask your health care provider about changing or stopping your regular medicines. This is especially important if you are taking diabetes medicines or blood thinners.  Follow instructions from your health care provider about eating or drinking restrictions.  Remove your jewelry on the day of the procedure. What happens during the procedure?  An IV will be inserted into one of your veins.  Your health care provider will inject a small amount of radioactive tracer through the IV.  You will wait  for 20-40 minutes while the tracer travels through your bloodstream.  Your heart activity will be monitored with an electrocardiogram (ECG).  You will lie down on an exam table.  Images of your heart will be taken for about 15-20 minutes.  You may also have a stress test. For this test, one of the following may be done: ? You will exercise on a treadmill or stationary bike.  While you exercise, your heart's activity will be monitored with an ECG, and your blood pressure will be checked. ? You will be given medicines that will increase blood flow to parts of your heart. This is done if you are unable to exercise.  When blood flow to your heart has peaked, a tracer will again be injected through the IV.  After 20-40 minutes, you will get back on the exam table and have more images taken of your heart.  Depending on the type of tracer used, scans may need to be repeated 3-4 hours later.  Your IV line will be removed when the procedure is over. The procedure may vary among health care providers and hospitals. What happens after the procedure?  Unless your health care provider tells you otherwise, you may return to your normal schedule, including diet, activities, and medicines.  Unless your health care provider tells you otherwise, you may increase your fluid intake. This will help to flush the contrast dye from your body. Drink enough fluid to keep your urine pale yellow.  Ask your health care provider, or the department that is doing the test: ? When will my results be ready? ? How will I get my results? Summary  A cardiac nuclear scan measures the blood flow to the heart when a person is resting and when he or she is exercising.  Tell your health care provider if you are pregnant.  Before the procedure, ask your health care provider about changing or stopping your regular medicines. This is especially important if you are taking diabetes medicines or blood thinners.  After the procedure, unless your health care provider tells you otherwise, increase your fluid intake. This will help flush the contrast dye from your body.  After the procedure, unless your health care provider tells you otherwise, you may return to your normal schedule, including diet, activities, and medicines. This information is not intended to replace advice given to you by your health  care provider. Make sure you discuss any questions you have with your health care provider. Document Released: 08/05/2004 Document Revised: 12/25/2017 Document Reviewed: 12/25/2017 Elsevier Interactive Patient Education  2019 [employer].

## 2018-09-03 NOTE — Addendum Note (Signed)
Addended by: Ashok Norris on: 09/03/2018 10:52 AM   Modules accepted: Orders

## 2018-09-03 NOTE — Progress Notes (Signed)
Cardiology Office Note:    Date:  09/03/2018   ID:  [name], DOB 1950-12-07, MRN [id number]  PCP:  Nicoletta Dress, MD  Cardiologist:  Jenean Lindau, MD   Referring MD: Nicoletta Dress, MD    ASSESSMENT:    1. Tightness in chest   2. Essential hypertension   3. Diabetes mellitus due to underlying condition with unspecified complications ([place])   4. Aortic stenosis, mild    PLAN:    In order of problems listed above:  1. Primary prevention stressed with the patient.  Importance of compliance with diet and medication stressed and she vocalized understanding. 2. Her blood pressure is stable.  In view of her chest tightness symptoms we will do a Lexiscan sestamibi now that we know her recent hemoglobin is fine.  We will also try to get a copy of these reports. 3. Patient will be seen in follow-up appointment in 6 months or earlier if the patient has any concerns.  She knows to go to the nearest emergency room for any concerning symptoms.   Medication Adjustments/Labs and Tests Ordered: Current medicines are reviewed at length with the patient today.  Concerns regarding medicines are outlined above.  No orders of the defined types were placed in this encounter.  No orders of the defined types were placed in this encounter.    No chief complaint on file.    History of Present Illness:    [name] is a 68 y.o. female.  Patient was evaluated by me for shortness of breath on exertion.  Echocardiogram has revealed mild aortic stenosis.  She complains of some chest tightness at times.  The last time I did not evaluate her because I was not sure about her anemia status.  She tells me that her primary care physician recently checked her blood and hemoglobin was 12.6.  We will try to obtain those reports.  At the time of my evaluation, the patient is alert awake oriented and in no distress.  Past Medical History:  Diagnosis Date  . Diabetes mellitus without complication  ([place])   . Hyperlipidemia   . Hypertension     Past Surgical History:  Procedure Laterality Date  . ABDOMINAL HYSTERECTOMY    . BACK SURGERY      Current Medications: Current Meds  Medication Sig  . colesevelam (WELCHOL) 625 MG tablet Take 1,875 mg by mouth 2 (two) times daily.  . furosemide (LASIX) 40 MG tablet Take 1 tablet by mouth daily.  . insulin aspart (NOVOLOG) 100 UNIT/ML FlexPen Inject 30 Units into the skin 2 (two) times daily.  Marland Kitchen KLOR-CON M10 10 MEQ tablet Take 1 tablet by mouth daily.  Marland Kitchen LEVEMIR FLEXTOUCH 100 UNIT/ML Pen Inject 40 Units into the skin 2 (two) times daily.  Marland Kitchen liraglutide (VICTOZA) 18 MG/3ML SOPN Inject 1.8 mg into the skin daily.   Marland Kitchen lisinopril (PRINIVIL,ZESTRIL) 40 MG tablet Take 1 tablet by mouth daily.  . metFORMIN (GLUCOPHAGE-XR) 500 MG 24 hr tablet Take 500 mg by mouth 2 (two) times daily with a meal.  . omeprazole (PRILOSEC) 20 MG capsule Take 1 capsule by mouth daily.  . potassium chloride (MICRO-K) 10 MEQ CR capsule Take 1 capsule by mouth daily.  . pravastatin (PRAVACHOL) 20 MG tablet Take 1 tablet by mouth once a week.  . pregabalin (LYRICA) 75 MG capsule Take 75 mg by mouth at bedtime.  Marland Kitchen rOPINIRole (REQUIP) 2 MG tablet Take 1 tablet by mouth at bedtime.  Marland Kitchen  Vitamin D, Ergocalciferol, (DRISDOL) 1.25 MG (50000 UT) CAPS capsule Take 1 capsule by mouth once a week.     Allergies:   Patient has no known allergies.   Social History   Socioeconomic History  . Marital status: Divorced    Spouse name: Not on file  . Number of children: Not on file  . Years of education: Not on file  . Highest education level: Not on file  Occupational History  . Not on file  Social Needs  . Financial resource strain: Not on file  . Food insecurity:    Worry: Not on file    Inability: Not on file  . Transportation needs:    Medical: Not on file    Non-medical: Not on file  Tobacco Use  . Smoking status: Former Research scientist (life sciences)  . Smokeless tobacco: Never Used  .  Tobacco comment: Social in 37 and 65  Substance and Sexual Activity  . Alcohol use: Never    Frequency: Never  . Drug use: Never  . Sexual activity: Not on file  Lifestyle  . Physical activity:    Days per week: Not on file    Minutes per session: Not on file  . Stress: Not on file  Relationships  . Social connections:    Talks on phone: Not on file    Gets together: Not on file    Attends religious service: Not on file    Active member of club or organization: Not on file    Attends meetings of clubs or organizations: Not on file    Relationship status: Not on file  Other Topics Concern  . Not on file  Social History Narrative  . Not on file     Family History: The patient's family history includes Cancer in her father; Heart disease in her mother.  ROS:   Please see the history of present illness.    All other systems reviewed and are negative.  EKGs/Labs/Other Studies Reviewed:    The following studies were reviewed today: Echocardiogram report was discussed with the patient at length.Patient:    Destiny, Curry MR #:       [id number] Study Date: 08/14/2018 Gender:     F Age:        54 Height:     160 cm Weight:     82.5 kg BSA:        1.95 m^2 Pt. Status: Room:   Daiva Eves, Lora Havens  ATTENDING    Jyl Heinz, MD  ORDERING     Jyl Heinz, MD  REFERRING    Jyl Heinz, MD  SONOGRAPHER  Joanie Coddington, RDCS  PERFORMING   [facility] [place]  cc:  ------------------------------------------------------------------- LV EF: 55% -   60%  ------------------------------------------------------------------- Indications:      Anemia 285.9.  Dyspnea 786.09.  ------------------------------------------------------------------- History:   Risk factors:  Hypertension. Diabetes mellitus.  ------------------------------------------------------------------- Study Conclusions  - Left ventricle: The cavity size was normal. Wall thickness was    increased in a pattern of moderate LVH. There was moderate   concentric hypertrophy. Systolic function was normal. The   estimated ejection fraction was in the range of 55% to 60%. Wall   motion was normal; there were no regional wall motion   abnormalities. Doppler parameters are consistent with abnormal   left ventricular relaxation (grade 1 diastolic dysfunction).   Doppler parameters are consistent with high ventricular filling   pressure. - Aortic valve: Trileaflet; mildly thickened leaflets. Cusp  separation was at the lower limits of normal. Transvalvular   velocity was minimally increased. There was very mild stenosis.   Valve area (VTI): 2.02 cm^2. Valve area (Vmax): 1.7 cm^2. Valve   area (Vmean): 1.52 cm^2. - Ascending aorta: The ascending aorta was mildly dilated. - Mitral valve: Mildly calcified annulus. Mildly thickened leaflets   . There was mild regurgitation. - Left atrium: The atrium was moderately to severely dilated.    Recent Labs: No results found for requested labs within last 8760 hours.  Recent Lipid Panel No results found for: CHOL, TRIG, HDL, CHOLHDL, VLDL, LDLCALC, LDLDIRECT  Physical Exam:    VS:  BP (!) 148/68 (BP Location: Right Arm, Patient Position: Sitting, Cuff Size: Normal)   Pulse 82   Ht 5\' 3"  (1.6 m)   Wt 178 lb (80.7 kg)   SpO2 98%   BMI 31.53 kg/m     Wt Readings from Last 3 Encounters:  09/03/18 178 lb (80.7 kg)  07/02/18 181 lb 12.8 oz (82.5 kg)     GEN: Patient is in no acute distress HEENT: Normal NECK: No JVD; No carotid bruits LYMPHATICS: No lymphadenopathy CARDIAC: Hear sounds regular, 2/6 systolic murmur at the apex. RESPIRATORY:  Clear to auscultation without rales, wheezing or rhonchi  ABDOMEN: Soft, non-tender, non-distended MUSCULOSKELETAL:  No edema; No deformity  SKIN: Warm and dry NEUROLOGIC:  Alert and oriented x 3 PSYCHIATRIC:  Normal affect   Signed, Jenean Lindau, MD  09/03/2018 10:38 AM    [facility]

## 2018-09-20 ENCOUNTER — Telehealth (HOSPITAL_COMMUNITY): Payer: Self-pay | Admitting: *Deleted

## 2018-09-20 DIAGNOSIS — M810 Age-related osteoporosis without current pathological fracture: Secondary | ICD-10-CM | POA: Diagnosis not present

## 2018-09-20 DIAGNOSIS — D509 Iron deficiency anemia, unspecified: Secondary | ICD-10-CM | POA: Diagnosis not present

## 2018-09-20 DIAGNOSIS — E559 Vitamin D deficiency, unspecified: Secondary | ICD-10-CM | POA: Diagnosis not present

## 2018-09-20 DIAGNOSIS — E1129 Type 2 diabetes mellitus with other diabetic kidney complication: Secondary | ICD-10-CM | POA: Diagnosis not present

## 2018-09-20 DIAGNOSIS — E785 Hyperlipidemia, unspecified: Secondary | ICD-10-CM | POA: Diagnosis not present

## 2018-09-20 NOTE — Telephone Encounter (Signed)
Patient given detailed instructions per Myocardial Perfusion Study Information Sheet for the test on 09/26/18. Patient notified to arrive 15 minutes early and that it is imperative to arrive on time for appointment to keep from having the test rescheduled.  If you need to cancel or reschedule your appointment, please call the office within 24 hours of your appointment. . Patient verbalized understanding. [name] Jacqueline    

## 2018-09-26 ENCOUNTER — Ambulatory Visit (INDEPENDENT_AMBULATORY_CARE_PROVIDER_SITE_OTHER): Payer: Medicare Other

## 2018-09-26 ENCOUNTER — Telehealth: Payer: Self-pay

## 2018-09-26 VITALS — Ht 63.0 in | Wt 178.0 lb

## 2018-09-26 DIAGNOSIS — R0789 Other chest pain: Secondary | ICD-10-CM

## 2018-09-26 DIAGNOSIS — R0609 Other forms of dyspnea: Secondary | ICD-10-CM | POA: Diagnosis not present

## 2018-09-26 DIAGNOSIS — R0600 Dyspnea, unspecified: Secondary | ICD-10-CM

## 2018-09-26 LAB — MYOCARDIAL PERFUSION IMAGING
CHL CUP NUCLEAR SDS: 4
CHL CUP RESTING HR STRESS: 78 {beats}/min
CSEPPHR: 90 {beats}/min
LV dias vol: 80 mL (ref 46–106)
LV sys vol: 30 mL
SRS: 1
SSS: 5
TID: 1.15

## 2018-09-26 MED ORDER — TECHNETIUM TC 99M TETROFOSMIN IV KIT
32.8000 | PACK | Freq: Once | INTRAVENOUS | Status: AC | PRN
  Administered 2018-09-26: 32.8 via INTRAVENOUS

## 2018-09-26 MED ORDER — TECHNETIUM TC 99M TETROFOSMIN IV KIT
10.9000 | PACK | Freq: Once | INTRAVENOUS | Status: AC | PRN
  Administered 2018-09-26: 10.9 via INTRAVENOUS

## 2018-09-26 MED ORDER — REGADENOSON 0.4 MG/5ML IV SOLN
0.4000 mg | Freq: Once | INTRAVENOUS | Status: AC
  Administered 2018-09-26: 0.4 mg via INTRAVENOUS

## 2018-09-26 NOTE — Telephone Encounter (Signed)
-----   Message from Jenean Lindau, MD sent at 09/26/2018  1:59 PM EST ----- The results of the study is unremarkable. Please inform patient. I will discuss in detail at next appointment. Cc  primary care/referring physician Jenean Lindau, MD 09/26/2018 1:59 PM

## 2018-09-26 NOTE — Telephone Encounter (Signed)
Patient called and notified of test results. 

## 2018-11-12 DIAGNOSIS — D519 Vitamin B12 deficiency anemia, unspecified: Secondary | ICD-10-CM | POA: Diagnosis not present

## 2018-11-12 DIAGNOSIS — R5383 Other fatigue: Secondary | ICD-10-CM | POA: Diagnosis not present

## 2018-11-12 DIAGNOSIS — D509 Iron deficiency anemia, unspecified: Secondary | ICD-10-CM | POA: Diagnosis not present

## 2018-11-27 DIAGNOSIS — D509 Iron deficiency anemia, unspecified: Secondary | ICD-10-CM

## 2018-12-04 DIAGNOSIS — Z1331 Encounter for screening for depression: Secondary | ICD-10-CM | POA: Diagnosis not present

## 2018-12-04 DIAGNOSIS — Z9181 History of falling: Secondary | ICD-10-CM | POA: Diagnosis not present

## 2018-12-04 DIAGNOSIS — Z0000 Encounter for general adult medical examination without abnormal findings: Secondary | ICD-10-CM | POA: Diagnosis not present

## 2018-12-04 DIAGNOSIS — E785 Hyperlipidemia, unspecified: Secondary | ICD-10-CM | POA: Diagnosis not present

## 2018-12-04 DIAGNOSIS — Z136 Encounter for screening for cardiovascular disorders: Secondary | ICD-10-CM | POA: Diagnosis not present

## 2018-12-05 DIAGNOSIS — D509 Iron deficiency anemia, unspecified: Secondary | ICD-10-CM | POA: Diagnosis not present

## 2018-12-05 DIAGNOSIS — E1129 Type 2 diabetes mellitus with other diabetic kidney complication: Secondary | ICD-10-CM | POA: Diagnosis not present

## 2018-12-05 DIAGNOSIS — E785 Hyperlipidemia, unspecified: Secondary | ICD-10-CM | POA: Diagnosis not present

## 2018-12-05 DIAGNOSIS — M810 Age-related osteoporosis without current pathological fracture: Secondary | ICD-10-CM | POA: Diagnosis not present

## 2018-12-05 DIAGNOSIS — E559 Vitamin D deficiency, unspecified: Secondary | ICD-10-CM | POA: Diagnosis not present

## 2018-12-19 DIAGNOSIS — E559 Vitamin D deficiency, unspecified: Secondary | ICD-10-CM | POA: Diagnosis not present

## 2018-12-19 DIAGNOSIS — M810 Age-related osteoporosis without current pathological fracture: Secondary | ICD-10-CM | POA: Diagnosis not present

## 2018-12-19 DIAGNOSIS — D509 Iron deficiency anemia, unspecified: Secondary | ICD-10-CM | POA: Diagnosis not present

## 2018-12-19 DIAGNOSIS — E1129 Type 2 diabetes mellitus with other diabetic kidney complication: Secondary | ICD-10-CM | POA: Diagnosis not present

## 2018-12-19 DIAGNOSIS — E785 Hyperlipidemia, unspecified: Secondary | ICD-10-CM | POA: Diagnosis not present

## 2018-12-24 DIAGNOSIS — L570 Actinic keratosis: Secondary | ICD-10-CM | POA: Diagnosis not present

## 2018-12-24 DIAGNOSIS — L578 Other skin changes due to chronic exposure to nonionizing radiation: Secondary | ICD-10-CM | POA: Diagnosis not present

## 2018-12-24 DIAGNOSIS — L821 Other seborrheic keratosis: Secondary | ICD-10-CM | POA: Diagnosis not present

## 2018-12-31 DIAGNOSIS — D509 Iron deficiency anemia, unspecified: Secondary | ICD-10-CM | POA: Diagnosis not present

## 2019-01-07 DIAGNOSIS — D509 Iron deficiency anemia, unspecified: Secondary | ICD-10-CM | POA: Diagnosis not present

## 2019-01-17 DIAGNOSIS — Z1159 Encounter for screening for other viral diseases: Secondary | ICD-10-CM | POA: Diagnosis not present

## 2019-02-04 DIAGNOSIS — D509 Iron deficiency anemia, unspecified: Secondary | ICD-10-CM

## 2019-02-18 DIAGNOSIS — Z860100 Personal history of colon polyps, unspecified: Secondary | ICD-10-CM

## 2019-02-18 DIAGNOSIS — D509 Iron deficiency anemia, unspecified: Secondary | ICD-10-CM | POA: Diagnosis not present

## 2019-02-18 HISTORY — DX: Personal history of colon polyps, unspecified: Z86.0100

## 2019-02-18 HISTORY — DX: Personal history of colonic polyps: Z86.010

## 2019-02-28 DIAGNOSIS — D509 Iron deficiency anemia, unspecified: Secondary | ICD-10-CM | POA: Diagnosis not present

## 2019-02-28 DIAGNOSIS — K2970 Gastritis, unspecified, without bleeding: Secondary | ICD-10-CM | POA: Diagnosis not present

## 2019-02-28 DIAGNOSIS — K2980 Duodenitis without bleeding: Secondary | ICD-10-CM | POA: Diagnosis not present

## 2019-02-28 DIAGNOSIS — J449 Chronic obstructive pulmonary disease, unspecified: Secondary | ICD-10-CM | POA: Diagnosis not present

## 2019-02-28 DIAGNOSIS — K317 Polyp of stomach and duodenum: Secondary | ICD-10-CM | POA: Diagnosis not present

## 2019-02-28 DIAGNOSIS — K2950 Unspecified chronic gastritis without bleeding: Secondary | ICD-10-CM | POA: Diagnosis not present

## 2019-02-28 DIAGNOSIS — Z79899 Other long term (current) drug therapy: Secondary | ICD-10-CM | POA: Diagnosis not present

## 2019-02-28 DIAGNOSIS — E7800 Pure hypercholesterolemia, unspecified: Secondary | ICD-10-CM | POA: Diagnosis not present

## 2019-02-28 DIAGNOSIS — E119 Type 2 diabetes mellitus without complications: Secondary | ICD-10-CM | POA: Diagnosis not present

## 2019-02-28 DIAGNOSIS — Z86010 Personal history of colonic polyps: Secondary | ICD-10-CM | POA: Diagnosis not present

## 2019-03-25 DIAGNOSIS — R809 Proteinuria, unspecified: Secondary | ICD-10-CM | POA: Diagnosis not present

## 2019-03-25 DIAGNOSIS — E559 Vitamin D deficiency, unspecified: Secondary | ICD-10-CM | POA: Diagnosis not present

## 2019-03-25 DIAGNOSIS — D509 Iron deficiency anemia, unspecified: Secondary | ICD-10-CM | POA: Diagnosis not present

## 2019-03-25 DIAGNOSIS — E1129 Type 2 diabetes mellitus with other diabetic kidney complication: Secondary | ICD-10-CM | POA: Diagnosis not present

## 2019-03-25 DIAGNOSIS — E1165 Type 2 diabetes mellitus with hyperglycemia: Secondary | ICD-10-CM | POA: Diagnosis not present

## 2019-03-25 DIAGNOSIS — E785 Hyperlipidemia, unspecified: Secondary | ICD-10-CM | POA: Diagnosis not present

## 2019-03-25 DIAGNOSIS — Z139 Encounter for screening, unspecified: Secondary | ICD-10-CM | POA: Diagnosis not present

## 2019-03-25 DIAGNOSIS — Z794 Long term (current) use of insulin: Secondary | ICD-10-CM | POA: Diagnosis not present

## 2019-04-04 DIAGNOSIS — E119 Type 2 diabetes mellitus without complications: Secondary | ICD-10-CM | POA: Diagnosis not present

## 2019-04-11 DIAGNOSIS — D509 Iron deficiency anemia, unspecified: Secondary | ICD-10-CM | POA: Diagnosis not present

## 2019-04-12 DIAGNOSIS — D649 Anemia, unspecified: Secondary | ICD-10-CM | POA: Diagnosis not present

## 2019-04-15 DIAGNOSIS — D649 Anemia, unspecified: Secondary | ICD-10-CM | POA: Diagnosis not present

## 2019-04-16 DIAGNOSIS — K7460 Unspecified cirrhosis of liver: Secondary | ICD-10-CM | POA: Diagnosis not present

## 2019-04-16 DIAGNOSIS — S99921A Unspecified injury of right foot, initial encounter: Secondary | ICD-10-CM | POA: Diagnosis not present

## 2019-04-16 DIAGNOSIS — D509 Iron deficiency anemia, unspecified: Secondary | ICD-10-CM | POA: Diagnosis not present

## 2019-04-16 DIAGNOSIS — R1011 Right upper quadrant pain: Secondary | ICD-10-CM | POA: Diagnosis not present

## 2019-04-18 DIAGNOSIS — D509 Iron deficiency anemia, unspecified: Secondary | ICD-10-CM | POA: Diagnosis not present

## 2019-04-18 DIAGNOSIS — S92309A Fracture of unspecified metatarsal bone(s), unspecified foot, initial encounter for closed fracture: Secondary | ICD-10-CM | POA: Diagnosis not present

## 2019-04-25 ENCOUNTER — Encounter: Payer: Self-pay | Admitting: Cardiology

## 2019-04-25 ENCOUNTER — Other Ambulatory Visit: Payer: Self-pay

## 2019-04-25 ENCOUNTER — Ambulatory Visit (INDEPENDENT_AMBULATORY_CARE_PROVIDER_SITE_OTHER): Payer: Medicare Other | Admitting: Cardiology

## 2019-04-25 VITALS — BP 140/60 | HR 100 | Ht 63.0 in | Wt 183.0 lb

## 2019-04-25 DIAGNOSIS — E088 Diabetes mellitus due to underlying condition with unspecified complications: Secondary | ICD-10-CM | POA: Diagnosis not present

## 2019-04-25 DIAGNOSIS — I350 Nonrheumatic aortic (valve) stenosis: Secondary | ICD-10-CM

## 2019-04-25 DIAGNOSIS — I10 Essential (primary) hypertension: Secondary | ICD-10-CM | POA: Diagnosis not present

## 2019-04-25 NOTE — Patient Instructions (Signed)
Medication Instructions:  Your physician recommends that you continue on your current medications as directed. Please refer to the Current Medication list given to you today.   If you need a refill on your cardiac medications before your next appointment, please call your pharmacy.   Lab work: NONE If you have labs (blood work) drawn today and your tests are completely normal, you will receive your results only by: . MyChart Message (if you have MyChart) OR . A paper copy in the mail If you have any lab test that is abnormal or we need to change your treatment, we will call you to review the results.  Testing/Procedures: NONE  Follow-Up: At [facility], you and your health needs are our priority.  As part of our continuing mission to provide you with exceptional heart care, we have created designated Provider Care Teams.  These Care Teams include your primary Cardiologist (physician) and Advanced Practice Providers (APPs -  Physician Assistants and Nurse Practitioners) who all work together to provide you with the care you need, when you need it. You will need a follow up appointment in 9 months.   

## 2019-04-25 NOTE — Progress Notes (Signed)
Cardiology Office Note:    Date:  04/25/2019   ID:  [name], DOB 01-22-1951, MRN [id number]  PCP:  Destiny Dress, MD  Cardiologist:  Destiny Lindau, MD   Referring MD: Destiny Dress, MD    ASSESSMENT:    1. Essential hypertension   2. Aortic stenosis, mild   3. Diabetes mellitus due to underlying condition with unspecified complications ([name])    PLAN:    In order of problems listed above:  1. Essential hypertension: Blood pressure stable.  Diet was discussed with the patient.  Diet was also discussed for dyslipidemia and diabetes mellitus. 2. Mild aortic stenosis: Stable and asymptomatic.  I discussed with her the results of the stress test also.  They have been detailed below. 3. Patient will be seen in follow-up appointment in 6 months or earlier if the patient has any concerns    Medication Adjustments/Labs and Tests Ordered: Current medicines are reviewed at length with the patient today.  Concerns regarding medicines are outlined above.  No orders of the defined types were placed in this encounter.  No orders of the defined types were placed in this encounter.    Chief Complaint  Patient presents with  . Follow-up     History of Present Illness:    [name] is a 67 y.o. female.  Patient has past medical history of essential hypertension, diabetes mellitus and dyslipidemia.  She has mild aortic stenosis.  She denies any problems at this time and takes care of activities of daily living.  No chest pain orthopnea or PND.  She leads a sedentary lifestyle because she has a fracture in her right foot and wearing a boot.  At the time of my evaluation, the patient is alert awake oriented and in no distress.  The patient mentions to me that her hemoglobin ran very low and she received multiple units of iron transfusion.  She is feeling better now.  Past Medical History:  Diagnosis Date  . Diabetes mellitus without complication ([place])   . Hyperlipidemia    . Hypertension     Past Surgical History:  Procedure Laterality Date  . ABDOMINAL HYSTERECTOMY    . BACK SURGERY      Current Medications: Current Meds  Medication Sig  . colesevelam (WELCHOL) 625 MG tablet Take 1,875 mg by mouth 2 (two) times daily.  . furosemide (LASIX) 40 MG tablet Take 1 tablet by mouth daily.  . insulin aspart (NOVOLOG) 100 UNIT/ML FlexPen Inject 30 Units into the skin 2 (two) times daily.  Marland Kitchen LEVEMIR FLEXTOUCH 100 UNIT/ML Pen Inject 40 Units into the skin 2 (two) times daily.  Marland Kitchen liraglutide (VICTOZA) 18 MG/3ML SOPN Inject 1.8 mg into the skin daily.   Marland Kitchen lisinopril (PRINIVIL,ZESTRIL) 40 MG tablet Take 1 tablet by mouth daily.  . metFORMIN (GLUCOPHAGE-XR) 500 MG 24 hr tablet Take 500 mg by mouth 2 (two) times daily with a meal.  . omeprazole (PRILOSEC) 20 MG capsule Take 1 capsule by mouth daily.  . pravastatin (PRAVACHOL) 20 MG tablet Take 1 tablet by mouth once a week.  . pregabalin (LYRICA) 75 MG capsule Take 75 mg by mouth at bedtime.  Marland Kitchen rOPINIRole (REQUIP) 2 MG tablet Take 1 tablet by mouth at bedtime.  . Vitamin D, Ergocalciferol, (DRISDOL) 1.25 MG (50000 UT) CAPS capsule Take 1 capsule by mouth once a week.     Allergies:   Patient has no known allergies.   Social History   Socioeconomic  History  . Marital status: Divorced    Spouse name: Not on file  . Number of children: Not on file  . Years of education: Not on file  . Highest education level: Not on file  Occupational History  . Not on file  Social Needs  . Financial resource strain: Not on file  . Food insecurity    Worry: Not on file    Inability: Not on file  . Transportation needs    Medical: Not on file    Non-medical: Not on file  Tobacco Use  . Smoking status: Former Research scientist (life sciences)  . Smokeless tobacco: Never Used  . Tobacco comment: Social in 72 and 37  Substance and Sexual Activity  . Alcohol use: Never    Frequency: Never  . Drug use: Never  . Sexual activity: Not on file   Lifestyle  . Physical activity    Days per week: Not on file    Minutes per session: Not on file  . Stress: Not on file  Relationships  . Social Herbalist on phone: Not on file    Gets together: Not on file    Attends religious service: Not on file    Active member of club or organization: Not on file    Attends meetings of clubs or organizations: Not on file    Relationship status: Not on file  Other Topics Concern  . Not on file  Social History Narrative  . Not on file     Family History: The patient's family history includes Cancer in her father; Heart disease in her mother.  ROS:   Please see the history of present illness.    All other systems reviewed and are negative.  EKGs/Labs/Other Studies Reviewed:    The following studies were reviewed today:  Nuclear stress EF: 63%.  The left ventricular ejection fraction is normal (55-65%).  Blood pressure demonstrated a normal response to exercise.  There was no ST segment deviation noted during stress.  Defect 1: There is a small defect of mild severity present in the mid anteroseptal and apical anterior location.  This is a low risk study.  No evidence of ischemia or MI.  Normal LVEF.   Study Conclusions  - Left ventricle: The cavity size was normal. Wall thickness was   increased in a pattern of moderate LVH. There was moderate   concentric hypertrophy. Systolic function was normal. The   estimated ejection fraction was in the range of 55% to 60%. Wall   motion was normal; there were no regional wall motion   abnormalities. Doppler parameters are consistent with abnormal   left ventricular relaxation (grade 1 diastolic dysfunction).   Doppler parameters are consistent with high ventricular filling   pressure. - Aortic valve: Trileaflet; mildly thickened leaflets. Cusp   separation was at the lower limits of normal. Transvalvular   velocity was minimally increased. There was very mild stenosis.    Valve area (VTI): 2.02 cm^2. Valve area (Vmax): 1.7 cm^2. Valve   area (Vmean): 1.52 cm^2. - Ascending aorta: The ascending aorta was mildly dilated. - Mitral valve: Mildly calcified annulus. Mildly thickened leaflets   . There was mild regurgitation. - Left atrium: The atrium was moderately to severely dilated.       Recent Labs: No results found for requested labs within last 8760 hours.  Recent Lipid Panel No results found for: CHOL, TRIG, HDL, CHOLHDL, VLDL, LDLCALC, LDLDIRECT  Physical Exam:    VS:  BP 140/60 (BP Location: Left Arm, Patient Position: Sitting, Cuff Size: Normal)   Pulse 100   Ht 5\' 3"  (1.6 m)   Wt 183 lb (83 kg)   SpO2 99%   BMI 32.42 kg/m     Wt Readings from Last 3 Encounters:  04/25/19 183 lb (83 kg)  09/26/18 178 lb (80.7 kg)  09/03/18 178 lb (80.7 kg)     GEN: Patient is in no acute distress HEENT: Normal NECK: No JVD; No carotid bruits LYMPHATICS: No lymphadenopathy CARDIAC: Hear sounds regular, 2/6 systolic murmur at the apex. RESPIRATORY:  Clear to auscultation without rales, wheezing or rhonchi  ABDOMEN: Soft, non-tender, non-distended MUSCULOSKELETAL:  No edema; No deformity  SKIN: Warm and dry NEUROLOGIC:  Alert and oriented x 3 PSYCHIATRIC:  Normal affect   Signed, Destiny Lindau, MD  04/25/2019 9:16 AM    [place]

## 2019-04-29 DIAGNOSIS — M79604 Pain in right leg: Secondary | ICD-10-CM | POA: Diagnosis not present

## 2019-04-29 DIAGNOSIS — M79671 Pain in right foot: Secondary | ICD-10-CM | POA: Diagnosis not present

## 2019-05-07 DIAGNOSIS — K7469 Other cirrhosis of liver: Secondary | ICD-10-CM | POA: Diagnosis not present

## 2019-05-07 DIAGNOSIS — D509 Iron deficiency anemia, unspecified: Secondary | ICD-10-CM | POA: Diagnosis not present

## 2019-05-16 DIAGNOSIS — D509 Iron deficiency anemia, unspecified: Secondary | ICD-10-CM | POA: Diagnosis not present

## 2019-05-28 DIAGNOSIS — K7469 Other cirrhosis of liver: Secondary | ICD-10-CM | POA: Diagnosis not present

## 2019-05-31 DIAGNOSIS — K31819 Angiodysplasia of stomach and duodenum without bleeding: Secondary | ICD-10-CM | POA: Diagnosis not present

## 2019-05-31 DIAGNOSIS — D509 Iron deficiency anemia, unspecified: Secondary | ICD-10-CM | POA: Diagnosis not present

## 2019-06-04 DIAGNOSIS — D509 Iron deficiency anemia, unspecified: Secondary | ICD-10-CM | POA: Diagnosis not present

## 2019-06-07 DIAGNOSIS — R188 Other ascites: Secondary | ICD-10-CM | POA: Insufficient documentation

## 2019-06-07 DIAGNOSIS — D696 Thrombocytopenia, unspecified: Secondary | ICD-10-CM | POA: Insufficient documentation

## 2019-06-10 DIAGNOSIS — D509 Iron deficiency anemia, unspecified: Secondary | ICD-10-CM | POA: Diagnosis not present

## 2019-06-17 DIAGNOSIS — D509 Iron deficiency anemia, unspecified: Secondary | ICD-10-CM | POA: Diagnosis not present

## 2019-06-27 DIAGNOSIS — E1165 Type 2 diabetes mellitus with hyperglycemia: Secondary | ICD-10-CM | POA: Diagnosis not present

## 2019-06-27 DIAGNOSIS — Z794 Long term (current) use of insulin: Secondary | ICD-10-CM | POA: Diagnosis not present

## 2019-06-27 DIAGNOSIS — E559 Vitamin D deficiency, unspecified: Secondary | ICD-10-CM | POA: Diagnosis not present

## 2019-06-27 DIAGNOSIS — Z23 Encounter for immunization: Secondary | ICD-10-CM | POA: Diagnosis not present

## 2019-06-27 DIAGNOSIS — D509 Iron deficiency anemia, unspecified: Secondary | ICD-10-CM | POA: Diagnosis not present

## 2019-06-27 DIAGNOSIS — E1129 Type 2 diabetes mellitus with other diabetic kidney complication: Secondary | ICD-10-CM | POA: Diagnosis not present

## 2019-06-27 DIAGNOSIS — E785 Hyperlipidemia, unspecified: Secondary | ICD-10-CM | POA: Diagnosis not present

## 2019-07-03 DIAGNOSIS — K5520 Angiodysplasia of colon without hemorrhage: Secondary | ICD-10-CM | POA: Insufficient documentation

## 2019-07-03 DIAGNOSIS — K7460 Unspecified cirrhosis of liver: Secondary | ICD-10-CM | POA: Insufficient documentation

## 2019-07-03 HISTORY — DX: Unspecified cirrhosis of liver: K74.60

## 2019-07-04 DIAGNOSIS — R1011 Right upper quadrant pain: Secondary | ICD-10-CM | POA: Diagnosis not present

## 2019-07-04 DIAGNOSIS — K5520 Angiodysplasia of colon without hemorrhage: Secondary | ICD-10-CM | POA: Diagnosis not present

## 2019-07-04 DIAGNOSIS — E875 Hyperkalemia: Secondary | ICD-10-CM | POA: Diagnosis not present

## 2019-07-04 DIAGNOSIS — K7460 Unspecified cirrhosis of liver: Secondary | ICD-10-CM | POA: Diagnosis not present

## 2019-07-04 DIAGNOSIS — D500 Iron deficiency anemia secondary to blood loss (chronic): Secondary | ICD-10-CM | POA: Diagnosis not present

## 2019-08-07 DIAGNOSIS — D509 Iron deficiency anemia, unspecified: Secondary | ICD-10-CM | POA: Diagnosis not present

## 2019-09-03 DIAGNOSIS — K31819 Angiodysplasia of stomach and duodenum without bleeding: Secondary | ICD-10-CM | POA: Diagnosis not present

## 2019-09-03 DIAGNOSIS — Z01812 Encounter for preprocedural laboratory examination: Secondary | ICD-10-CM | POA: Diagnosis not present

## 2019-09-03 DIAGNOSIS — Z20822 Contact with and (suspected) exposure to covid-19: Secondary | ICD-10-CM | POA: Diagnosis not present

## 2019-09-10 DIAGNOSIS — K31819 Angiodysplasia of stomach and duodenum without bleeding: Secondary | ICD-10-CM | POA: Diagnosis not present

## 2019-09-10 DIAGNOSIS — K7460 Unspecified cirrhosis of liver: Secondary | ICD-10-CM | POA: Diagnosis not present

## 2019-09-10 DIAGNOSIS — R198 Other specified symptoms and signs involving the digestive system and abdomen: Secondary | ICD-10-CM | POA: Diagnosis not present

## 2019-09-10 DIAGNOSIS — Z794 Long term (current) use of insulin: Secondary | ICD-10-CM | POA: Diagnosis not present

## 2019-09-10 DIAGNOSIS — K31811 Angiodysplasia of stomach and duodenum with bleeding: Secondary | ICD-10-CM | POA: Diagnosis not present

## 2019-09-10 DIAGNOSIS — Z87891 Personal history of nicotine dependence: Secondary | ICD-10-CM | POA: Diagnosis not present

## 2019-09-10 DIAGNOSIS — I10 Essential (primary) hypertension: Secondary | ICD-10-CM | POA: Diagnosis not present

## 2019-09-10 DIAGNOSIS — K6389 Other specified diseases of intestine: Secondary | ICD-10-CM | POA: Diagnosis not present

## 2019-09-10 DIAGNOSIS — K766 Portal hypertension: Secondary | ICD-10-CM | POA: Diagnosis not present

## 2019-09-10 DIAGNOSIS — E119 Type 2 diabetes mellitus without complications: Secondary | ICD-10-CM | POA: Diagnosis not present

## 2019-09-10 DIAGNOSIS — Z79899 Other long term (current) drug therapy: Secondary | ICD-10-CM | POA: Diagnosis not present

## 2019-09-10 DIAGNOSIS — D509 Iron deficiency anemia, unspecified: Secondary | ICD-10-CM | POA: Diagnosis not present

## 2019-09-10 DIAGNOSIS — K3189 Other diseases of stomach and duodenum: Secondary | ICD-10-CM | POA: Diagnosis not present

## 2019-09-16 DIAGNOSIS — R6889 Other general symptoms and signs: Secondary | ICD-10-CM | POA: Diagnosis not present

## 2019-09-16 DIAGNOSIS — D509 Iron deficiency anemia, unspecified: Secondary | ICD-10-CM | POA: Diagnosis not present

## 2019-09-27 DIAGNOSIS — E1129 Type 2 diabetes mellitus with other diabetic kidney complication: Secondary | ICD-10-CM | POA: Diagnosis not present

## 2019-09-27 DIAGNOSIS — Z794 Long term (current) use of insulin: Secondary | ICD-10-CM | POA: Diagnosis not present

## 2019-09-27 DIAGNOSIS — E1165 Type 2 diabetes mellitus with hyperglycemia: Secondary | ICD-10-CM | POA: Diagnosis not present

## 2019-09-27 DIAGNOSIS — E785 Hyperlipidemia, unspecified: Secondary | ICD-10-CM | POA: Diagnosis not present

## 2019-09-27 DIAGNOSIS — R809 Proteinuria, unspecified: Secondary | ICD-10-CM | POA: Diagnosis not present

## 2019-09-27 DIAGNOSIS — E559 Vitamin D deficiency, unspecified: Secondary | ICD-10-CM | POA: Diagnosis not present

## 2019-09-27 DIAGNOSIS — E039 Hypothyroidism, unspecified: Secondary | ICD-10-CM | POA: Diagnosis not present

## 2019-09-27 DIAGNOSIS — D509 Iron deficiency anemia, unspecified: Secondary | ICD-10-CM | POA: Diagnosis not present

## 2019-10-23 DIAGNOSIS — D509 Iron deficiency anemia, unspecified: Secondary | ICD-10-CM | POA: Diagnosis not present

## 2019-10-24 DIAGNOSIS — D509 Iron deficiency anemia, unspecified: Secondary | ICD-10-CM | POA: Diagnosis not present

## 2019-10-31 DIAGNOSIS — D509 Iron deficiency anemia, unspecified: Secondary | ICD-10-CM | POA: Diagnosis not present

## 2019-11-14 DIAGNOSIS — R198 Other specified symptoms and signs involving the digestive system and abdomen: Secondary | ICD-10-CM | POA: Diagnosis not present

## 2019-11-14 DIAGNOSIS — D509 Iron deficiency anemia, unspecified: Secondary | ICD-10-CM | POA: Diagnosis not present

## 2019-11-14 DIAGNOSIS — K5520 Angiodysplasia of colon without hemorrhage: Secondary | ICD-10-CM | POA: Diagnosis not present

## 2019-11-14 DIAGNOSIS — Z8719 Personal history of other diseases of the digestive system: Secondary | ICD-10-CM | POA: Diagnosis not present

## 2019-11-14 DIAGNOSIS — D500 Iron deficiency anemia secondary to blood loss (chronic): Secondary | ICD-10-CM | POA: Diagnosis not present

## 2019-11-18 DIAGNOSIS — A6000 Herpesviral infection of urogenital system, unspecified: Secondary | ICD-10-CM | POA: Diagnosis not present

## 2019-11-18 DIAGNOSIS — D500 Iron deficiency anemia secondary to blood loss (chronic): Secondary | ICD-10-CM | POA: Diagnosis not present

## 2019-11-26 DIAGNOSIS — Q2733 Arteriovenous malformation of digestive system vessel: Secondary | ICD-10-CM | POA: Diagnosis not present

## 2019-11-26 DIAGNOSIS — R233 Spontaneous ecchymoses: Secondary | ICD-10-CM | POA: Diagnosis not present

## 2019-11-26 DIAGNOSIS — D500 Iron deficiency anemia secondary to blood loss (chronic): Secondary | ICD-10-CM | POA: Diagnosis not present

## 2019-11-26 DIAGNOSIS — D509 Iron deficiency anemia, unspecified: Secondary | ICD-10-CM | POA: Diagnosis not present

## 2019-11-26 DIAGNOSIS — L578 Other skin changes due to chronic exposure to nonionizing radiation: Secondary | ICD-10-CM | POA: Diagnosis not present

## 2019-12-12 DIAGNOSIS — K7460 Unspecified cirrhosis of liver: Secondary | ICD-10-CM | POA: Diagnosis not present

## 2019-12-16 DIAGNOSIS — Z23 Encounter for immunization: Secondary | ICD-10-CM | POA: Diagnosis not present

## 2019-12-25 DIAGNOSIS — R042 Hemoptysis: Secondary | ICD-10-CM | POA: Diagnosis not present

## 2020-01-05 DIAGNOSIS — R040 Epistaxis: Secondary | ICD-10-CM | POA: Diagnosis not present

## 2020-01-05 DIAGNOSIS — E039 Hypothyroidism, unspecified: Secondary | ICD-10-CM | POA: Diagnosis not present

## 2020-01-05 DIAGNOSIS — Z794 Long term (current) use of insulin: Secondary | ICD-10-CM | POA: Diagnosis not present

## 2020-01-05 DIAGNOSIS — K219 Gastro-esophageal reflux disease without esophagitis: Secondary | ICD-10-CM | POA: Diagnosis present

## 2020-01-05 DIAGNOSIS — R519 Headache, unspecified: Secondary | ICD-10-CM | POA: Diagnosis not present

## 2020-01-05 DIAGNOSIS — J342 Deviated nasal septum: Secondary | ICD-10-CM | POA: Diagnosis not present

## 2020-01-05 DIAGNOSIS — E1165 Type 2 diabetes mellitus with hyperglycemia: Secondary | ICD-10-CM | POA: Diagnosis not present

## 2020-01-05 DIAGNOSIS — J449 Chronic obstructive pulmonary disease, unspecified: Secondary | ICD-10-CM | POA: Diagnosis present

## 2020-01-05 DIAGNOSIS — J9811 Atelectasis: Secondary | ICD-10-CM | POA: Diagnosis not present

## 2020-01-05 DIAGNOSIS — D649 Anemia, unspecified: Secondary | ICD-10-CM | POA: Diagnosis not present

## 2020-01-05 DIAGNOSIS — E876 Hypokalemia: Secondary | ICD-10-CM | POA: Diagnosis not present

## 2020-01-05 DIAGNOSIS — I517 Cardiomegaly: Secondary | ICD-10-CM | POA: Diagnosis not present

## 2020-01-05 DIAGNOSIS — D509 Iron deficiency anemia, unspecified: Secondary | ICD-10-CM | POA: Diagnosis present

## 2020-01-05 DIAGNOSIS — J9601 Acute respiratory failure with hypoxia: Secondary | ICD-10-CM | POA: Diagnosis present

## 2020-01-05 DIAGNOSIS — R531 Weakness: Secondary | ICD-10-CM | POA: Diagnosis not present

## 2020-01-05 DIAGNOSIS — F418 Other specified anxiety disorders: Secondary | ICD-10-CM | POA: Diagnosis present

## 2020-01-05 DIAGNOSIS — Z79899 Other long term (current) drug therapy: Secondary | ICD-10-CM | POA: Diagnosis not present

## 2020-01-05 DIAGNOSIS — E1169 Type 2 diabetes mellitus with other specified complication: Secondary | ICD-10-CM | POA: Diagnosis not present

## 2020-01-05 DIAGNOSIS — E785 Hyperlipidemia, unspecified: Secondary | ICD-10-CM | POA: Diagnosis present

## 2020-01-05 DIAGNOSIS — N390 Urinary tract infection, site not specified: Secondary | ICD-10-CM | POA: Diagnosis not present

## 2020-01-05 DIAGNOSIS — M810 Age-related osteoporosis without current pathological fracture: Secondary | ICD-10-CM | POA: Diagnosis present

## 2020-01-07 DIAGNOSIS — J342 Deviated nasal septum: Secondary | ICD-10-CM | POA: Diagnosis not present

## 2020-01-07 DIAGNOSIS — R040 Epistaxis: Secondary | ICD-10-CM | POA: Diagnosis not present

## 2020-01-07 DIAGNOSIS — D649 Anemia, unspecified: Secondary | ICD-10-CM | POA: Diagnosis not present

## 2020-01-22 DIAGNOSIS — D509 Iron deficiency anemia, unspecified: Secondary | ICD-10-CM | POA: Diagnosis not present

## 2020-01-31 DIAGNOSIS — S0003XA Contusion of scalp, initial encounter: Secondary | ICD-10-CM | POA: Diagnosis not present

## 2020-01-31 DIAGNOSIS — I6782 Cerebral ischemia: Secondary | ICD-10-CM | POA: Diagnosis not present

## 2020-01-31 DIAGNOSIS — G44309 Post-traumatic headache, unspecified, not intractable: Secondary | ICD-10-CM | POA: Diagnosis not present

## 2020-02-12 DIAGNOSIS — R4182 Altered mental status, unspecified: Secondary | ICD-10-CM | POA: Diagnosis not present

## 2020-02-12 DIAGNOSIS — Z79899 Other long term (current) drug therapy: Secondary | ICD-10-CM | POA: Diagnosis not present

## 2020-02-12 DIAGNOSIS — R0600 Dyspnea, unspecified: Secondary | ICD-10-CM | POA: Diagnosis not present

## 2020-02-12 DIAGNOSIS — Z794 Long term (current) use of insulin: Secondary | ICD-10-CM | POA: Diagnosis not present

## 2020-02-12 DIAGNOSIS — J323 Chronic sphenoidal sinusitis: Secondary | ICD-10-CM | POA: Diagnosis not present

## 2020-02-12 DIAGNOSIS — F419 Anxiety disorder, unspecified: Secondary | ICD-10-CM | POA: Diagnosis present

## 2020-02-12 DIAGNOSIS — E8770 Fluid overload, unspecified: Secondary | ICD-10-CM | POA: Diagnosis not present

## 2020-02-12 DIAGNOSIS — A419 Sepsis, unspecified organism: Secondary | ICD-10-CM | POA: Diagnosis not present

## 2020-02-12 DIAGNOSIS — J449 Chronic obstructive pulmonary disease, unspecified: Secondary | ICD-10-CM | POA: Diagnosis present

## 2020-02-12 DIAGNOSIS — Z9189 Other specified personal risk factors, not elsewhere classified: Secondary | ICD-10-CM | POA: Diagnosis not present

## 2020-02-12 DIAGNOSIS — E785 Hyperlipidemia, unspecified: Secondary | ICD-10-CM | POA: Diagnosis present

## 2020-02-12 DIAGNOSIS — J320 Chronic maxillary sinusitis: Secondary | ICD-10-CM | POA: Diagnosis not present

## 2020-02-12 DIAGNOSIS — J3489 Other specified disorders of nose and nasal sinuses: Secondary | ICD-10-CM | POA: Diagnosis not present

## 2020-02-12 DIAGNOSIS — R0902 Hypoxemia: Secondary | ICD-10-CM | POA: Diagnosis not present

## 2020-02-12 DIAGNOSIS — G9341 Metabolic encephalopathy: Secondary | ICD-10-CM | POA: Diagnosis not present

## 2020-02-12 DIAGNOSIS — I517 Cardiomegaly: Secondary | ICD-10-CM | POA: Diagnosis not present

## 2020-02-12 DIAGNOSIS — Q2733 Arteriovenous malformation of digestive system vessel: Secondary | ICD-10-CM | POA: Diagnosis not present

## 2020-02-12 DIAGNOSIS — Z8659 Personal history of other mental and behavioral disorders: Secondary | ICD-10-CM | POA: Diagnosis not present

## 2020-02-12 DIAGNOSIS — J019 Acute sinusitis, unspecified: Secondary | ICD-10-CM | POA: Diagnosis not present

## 2020-02-12 DIAGNOSIS — Z86010 Personal history of colonic polyps: Secondary | ICD-10-CM | POA: Diagnosis not present

## 2020-02-12 DIAGNOSIS — D649 Anemia, unspecified: Secondary | ICD-10-CM | POA: Diagnosis not present

## 2020-02-12 DIAGNOSIS — F329 Major depressive disorder, single episode, unspecified: Secondary | ICD-10-CM | POA: Diagnosis present

## 2020-02-12 DIAGNOSIS — R509 Fever, unspecified: Secondary | ICD-10-CM | POA: Diagnosis not present

## 2020-02-12 DIAGNOSIS — D509 Iron deficiency anemia, unspecified: Secondary | ICD-10-CM | POA: Diagnosis present

## 2020-02-12 DIAGNOSIS — I10 Essential (primary) hypertension: Secondary | ICD-10-CM | POA: Diagnosis present

## 2020-02-12 DIAGNOSIS — J9690 Respiratory failure, unspecified, unspecified whether with hypoxia or hypercapnia: Secondary | ICD-10-CM | POA: Diagnosis not present

## 2020-02-12 DIAGNOSIS — Q2730 Arteriovenous malformation, site unspecified: Secondary | ICD-10-CM | POA: Diagnosis not present

## 2020-02-12 DIAGNOSIS — J811 Chronic pulmonary edema: Secondary | ICD-10-CM | POA: Diagnosis not present

## 2020-02-12 DIAGNOSIS — R918 Other nonspecific abnormal finding of lung field: Secondary | ICD-10-CM | POA: Diagnosis not present

## 2020-02-12 DIAGNOSIS — K7581 Nonalcoholic steatohepatitis (NASH): Secondary | ICD-10-CM | POA: Diagnosis present

## 2020-02-12 DIAGNOSIS — J9811 Atelectasis: Secondary | ICD-10-CM | POA: Diagnosis not present

## 2020-02-12 DIAGNOSIS — I361 Nonrheumatic tricuspid (valve) insufficiency: Secondary | ICD-10-CM | POA: Diagnosis not present

## 2020-02-12 DIAGNOSIS — J9601 Acute respiratory failure with hypoxia: Secondary | ICD-10-CM | POA: Diagnosis not present

## 2020-02-12 DIAGNOSIS — E119 Type 2 diabetes mellitus without complications: Secondary | ICD-10-CM | POA: Diagnosis present

## 2020-02-12 DIAGNOSIS — R040 Epistaxis: Secondary | ICD-10-CM | POA: Diagnosis not present

## 2020-02-12 DIAGNOSIS — Z87442 Personal history of urinary calculi: Secondary | ICD-10-CM | POA: Diagnosis not present

## 2020-02-12 DIAGNOSIS — I7090 Unspecified atherosclerosis: Secondary | ICD-10-CM | POA: Diagnosis not present

## 2020-02-12 DIAGNOSIS — Z87440 Personal history of urinary (tract) infections: Secondary | ICD-10-CM | POA: Diagnosis not present

## 2020-02-14 DIAGNOSIS — J9601 Acute respiratory failure with hypoxia: Secondary | ICD-10-CM

## 2020-02-15 DIAGNOSIS — I361 Nonrheumatic tricuspid (valve) insufficiency: Secondary | ICD-10-CM

## 2020-02-15 DIAGNOSIS — D509 Iron deficiency anemia, unspecified: Secondary | ICD-10-CM

## 2020-02-16 DIAGNOSIS — D509 Iron deficiency anemia, unspecified: Secondary | ICD-10-CM

## 2020-02-17 DIAGNOSIS — R040 Epistaxis: Secondary | ICD-10-CM

## 2020-02-17 DIAGNOSIS — E785 Hyperlipidemia, unspecified: Secondary | ICD-10-CM

## 2020-02-17 DIAGNOSIS — D649 Anemia, unspecified: Secondary | ICD-10-CM

## 2020-02-17 DIAGNOSIS — I10 Essential (primary) hypertension: Secondary | ICD-10-CM

## 2020-02-17 DIAGNOSIS — E119 Type 2 diabetes mellitus without complications: Secondary | ICD-10-CM

## 2020-02-18 ENCOUNTER — Ambulatory Visit: Payer: Medicare Other | Admitting: Cardiology

## 2020-02-18 DIAGNOSIS — D649 Anemia, unspecified: Secondary | ICD-10-CM | POA: Diagnosis not present

## 2020-02-18 DIAGNOSIS — E119 Type 2 diabetes mellitus without complications: Secondary | ICD-10-CM | POA: Diagnosis not present

## 2020-02-18 DIAGNOSIS — D509 Iron deficiency anemia, unspecified: Secondary | ICD-10-CM

## 2020-02-18 DIAGNOSIS — I10 Essential (primary) hypertension: Secondary | ICD-10-CM | POA: Diagnosis not present

## 2020-02-18 DIAGNOSIS — E785 Hyperlipidemia, unspecified: Secondary | ICD-10-CM | POA: Diagnosis not present

## 2020-02-19 DIAGNOSIS — D509 Iron deficiency anemia, unspecified: Secondary | ICD-10-CM

## 2020-02-21 DIAGNOSIS — J449 Chronic obstructive pulmonary disease, unspecified: Secondary | ICD-10-CM | POA: Diagnosis not present

## 2020-02-21 DIAGNOSIS — I10 Essential (primary) hypertension: Secondary | ICD-10-CM | POA: Diagnosis not present

## 2020-02-21 DIAGNOSIS — F419 Anxiety disorder, unspecified: Secondary | ICD-10-CM | POA: Diagnosis not present

## 2020-02-21 DIAGNOSIS — Z794 Long term (current) use of insulin: Secondary | ICD-10-CM | POA: Diagnosis not present

## 2020-02-21 DIAGNOSIS — Z9181 History of falling: Secondary | ICD-10-CM | POA: Diagnosis not present

## 2020-02-21 DIAGNOSIS — K760 Fatty (change of) liver, not elsewhere classified: Secondary | ICD-10-CM | POA: Diagnosis not present

## 2020-02-21 DIAGNOSIS — E785 Hyperlipidemia, unspecified: Secondary | ICD-10-CM | POA: Diagnosis not present

## 2020-02-21 DIAGNOSIS — E119 Type 2 diabetes mellitus without complications: Secondary | ICD-10-CM | POA: Diagnosis not present

## 2020-02-21 DIAGNOSIS — N200 Calculus of kidney: Secondary | ICD-10-CM | POA: Diagnosis not present

## 2020-02-21 DIAGNOSIS — D509 Iron deficiency anemia, unspecified: Secondary | ICD-10-CM | POA: Diagnosis not present

## 2020-02-21 DIAGNOSIS — Z87440 Personal history of urinary (tract) infections: Secondary | ICD-10-CM | POA: Diagnosis not present

## 2020-02-21 DIAGNOSIS — R040 Epistaxis: Secondary | ICD-10-CM | POA: Diagnosis not present

## 2020-02-21 DIAGNOSIS — F329 Major depressive disorder, single episode, unspecified: Secondary | ICD-10-CM | POA: Diagnosis not present

## 2020-02-23 DIAGNOSIS — I10 Essential (primary) hypertension: Secondary | ICD-10-CM | POA: Diagnosis not present

## 2020-02-23 DIAGNOSIS — K760 Fatty (change of) liver, not elsewhere classified: Secondary | ICD-10-CM | POA: Diagnosis not present

## 2020-02-23 DIAGNOSIS — J449 Chronic obstructive pulmonary disease, unspecified: Secondary | ICD-10-CM | POA: Diagnosis not present

## 2020-02-23 DIAGNOSIS — D509 Iron deficiency anemia, unspecified: Secondary | ICD-10-CM | POA: Diagnosis not present

## 2020-02-23 DIAGNOSIS — R040 Epistaxis: Secondary | ICD-10-CM | POA: Diagnosis not present

## 2020-02-23 DIAGNOSIS — E119 Type 2 diabetes mellitus without complications: Secondary | ICD-10-CM | POA: Diagnosis not present

## 2020-02-24 DIAGNOSIS — K7460 Unspecified cirrhosis of liver: Secondary | ICD-10-CM | POA: Diagnosis not present

## 2020-02-24 DIAGNOSIS — D500 Iron deficiency anemia secondary to blood loss (chronic): Secondary | ICD-10-CM | POA: Diagnosis not present

## 2020-02-24 DIAGNOSIS — A419 Sepsis, unspecified organism: Secondary | ICD-10-CM | POA: Diagnosis not present

## 2020-02-24 DIAGNOSIS — R040 Epistaxis: Secondary | ICD-10-CM | POA: Diagnosis not present

## 2020-02-24 DIAGNOSIS — K7581 Nonalcoholic steatohepatitis (NASH): Secondary | ICD-10-CM | POA: Diagnosis not present

## 2020-02-27 ENCOUNTER — Other Ambulatory Visit: Payer: Self-pay

## 2020-02-27 ENCOUNTER — Encounter: Payer: Self-pay | Admitting: *Deleted

## 2020-02-27 ENCOUNTER — Ambulatory Visit (INDEPENDENT_AMBULATORY_CARE_PROVIDER_SITE_OTHER): Payer: Medicare Other | Admitting: Cardiology

## 2020-02-27 VITALS — BP 140/60 | HR 92 | Ht 63.0 in | Wt 173.0 lb

## 2020-02-27 DIAGNOSIS — D508 Other iron deficiency anemias: Secondary | ICD-10-CM

## 2020-02-27 DIAGNOSIS — I10 Essential (primary) hypertension: Secondary | ICD-10-CM | POA: Diagnosis not present

## 2020-02-27 DIAGNOSIS — E088 Diabetes mellitus due to underlying condition with unspecified complications: Secondary | ICD-10-CM | POA: Diagnosis not present

## 2020-02-27 DIAGNOSIS — Z6831 Body mass index (BMI) 31.0-31.9, adult: Secondary | ICD-10-CM | POA: Diagnosis not present

## 2020-02-27 DIAGNOSIS — R531 Weakness: Secondary | ICD-10-CM

## 2020-02-27 DIAGNOSIS — I5032 Chronic diastolic (congestive) heart failure: Secondary | ICD-10-CM

## 2020-02-27 DIAGNOSIS — E782 Mixed hyperlipidemia: Secondary | ICD-10-CM | POA: Diagnosis not present

## 2020-02-27 NOTE — Patient Instructions (Signed)
Medication Instructions:  Your physician recommends that you continue on your current medications as directed. Please refer to the Current Medication list given to you today.  *If you need a refill on your cardiac medications before your next appointment, please call your pharmacy*   Lab Work: NONE If you have labs (blood work) drawn today and your tests are completely normal, you will receive your results only by: MyChart Message (if you have MyChart) OR A paper copy in the mail If you have any lab test that is abnormal or we need to change your treatment, we will call you to review the results.   Testing/Procedures: NONE   Follow-Up: At [facility], you and your health needs are our priority.  As part of our continuing mission to provide you with exceptional heart care, we have created designated Provider Care Teams.  These Care Teams include your primary Cardiologist (physician) and Advanced Practice Providers (APPs -  Physician Assistants and Nurse Practitioners) who all work together to provide you with the care you need, when you need it.  We recommend signing up for the patient portal called "MyChart".  Sign up information is provided on this After Visit Summary.  MyChart is used to connect with patients for Virtual Visits (Telemedicine).  Patients are able to view lab/test results, encounter notes, upcoming appointments, etc.  Non-urgent messages can be sent to your provider as well.   To learn more about what you can do with MyChart, go to [email or web address].    Your next appointment:   6 month(s)  The format for your next appointment:   In Person  Provider:   Kardie Tobb, DO   

## 2020-02-27 NOTE — Progress Notes (Signed)
Cardiology Office Note:    Date:  02/27/2020   ID:  [name], DOB 1951-03-27, MRN [id number]  PCP:  Nicoletta Dress, MD  Cardiologist:  No primary care provider on file.  Electrophysiologist:  None   Referring MD: Nicoletta Dress, MD   Chief Complaint  Patient presents with  . Hospitalization Follow-up    History of Present Illness:    [name] is a 69 y.o. female with a hx of hypertension, diabetes on insulin, dyslipidemia, mild aortic stenosis, chronic iron deficiency presents today for follow-up visit.  Patient was recently admitted at [facility] for sepsis where she was treated and discharged home.  Due to anemia she was giving packed red blood cells, she was seen also by otology oncology during her visit.  During her hospitalization the patient did get an echocardiogram which initially was concerning for right atrial mass but after further images it showed that the patient in fact did not have any right atrial mass this was an artifact.  She is here today for follow-up visit.  She tells me that she is experiencing great fatigue.  She is almost in tears and notes that she feels like her muscle is weak and flapping and she is unable to do her daily activities.  She denies any chest pain, shortness of breath, nausea, vomiting.   Past Medical History:  Diagnosis Date  . Anemia 07/02/2018  . Aortic stenosis, mild 09/03/2018  . Cirrhosis of liver without ascites ([place]) 07/03/2019  . Diabetes mellitus due to underlying condition with unspecified complications ([place]) 02/28/5783  . Diabetes mellitus without complication ([place])   . Essential hypertension 07/02/2018  . Hematochezia 06/20/2016  . History of colonic polyps 02/18/2019  . Hyperlipidemia   . Hypertension   . Iron deficiency anemia 06/20/2016  . RUQ pain 06/20/2016  . Tightness in chest 09/03/2018    Past Surgical History:  Procedure Laterality Date  . ABDOMINAL HYSTERECTOMY    . BACK SURGERY       Current Medications: Current Meds  Medication Sig  . colesevelam (WELCHOL) 625 MG tablet Take 1,875 mg by mouth 2 (two) times daily.  . furosemide (LASIX) 40 MG tablet Take 1 tablet by mouth daily.  . insulin aspart (NOVOLOG) 100 UNIT/ML FlexPen Inject 30 Units into the skin 2 (two) times daily.  Marland Kitchen LEVEMIR FLEXTOUCH 100 UNIT/ML Pen Inject 40 Units into the skin 2 (two) times daily.  Marland Kitchen levothyroxine (SYNTHROID) 50 MCG tablet Take 50 mcg by mouth daily.  Marland Kitchen liraglutide (VICTOZA) 18 MG/3ML SOPN Inject 1.8 mg into the skin daily.   Marland Kitchen lisinopril (PRINIVIL,ZESTRIL) 40 MG tablet Take 1 tablet by mouth daily.  . metFORMIN (GLUCOPHAGE-XR) 500 MG 24 hr tablet Take 500 mg by mouth 2 (two) times daily with a meal.  . montelukast (SINGULAIR) 10 MG tablet Take 10 mg by mouth at bedtime.  Marland Kitchen nystatin (MYCOSTATIN/NYSTOP) powder   . omeprazole (PRILOSEC) 20 MG capsule Take 1 capsule by mouth daily.  . pravastatin (PRAVACHOL) 20 MG tablet Take 1 tablet by mouth once a week.  . pregabalin (LYRICA) 75 MG capsule Take 75 mg by mouth at bedtime.  Marland Kitchen rOPINIRole (REQUIP) 2 MG tablet Take 1 tablet by mouth at bedtime.  Marland Kitchen spironolactone (ALDACTONE) 25 MG tablet Take 25 mg by mouth 2 (two) times daily.  . Vitamin D, Ergocalciferol, (DRISDOL) 1.25 MG (50000 UT) CAPS capsule Take 1 capsule by mouth once a week.     Allergies:  Patient has no known allergies.   Social History   Socioeconomic History  . Marital status: Divorced    Spouse name: Not on file  . Number of children: Not on file  . Years of education: Not on file  . Highest education level: Not on file  Occupational History  . Not on file  Tobacco Use  . Smoking status: Former Research scientist (life sciences)  . Smokeless tobacco: Never Used  . Tobacco comment: Social in 32 and 3  Vaping Use  . Vaping Use: Never used  Substance and Sexual Activity  . Alcohol use: Never  . Drug use: Never  . Sexual activity: Not on file  Other Topics Concern  . Not on file    Social History Narrative  . Not on file   Social Determinants of Health   Financial Resource Strain:   . Difficulty of Paying Living Expenses:   Food Insecurity:   . Worried About Charity fundraiser in the Last Year:   . Arboriculturist in the Last Year:   Transportation Needs:   . Film/video editor (Medical):   Marland Kitchen Lack of Transportation (Non-Medical):   Physical Activity:   . Days of Exercise per Week:   . Minutes of Exercise per Session:   Stress:   . Feeling of Stress :   Social Connections:   . Frequency of Communication with Friends and Family:   . Frequency of Social Gatherings with Friends and Family:   . Attends Religious Services:   . Active Member of Clubs or Organizations:   . Attends Archivist Meetings:   Marland Kitchen Marital Status:      Family History: The patient's family history includes Alzheimer's disease in her mother; Cancer in her father; Heart disease in her mother; Kidney cancer in her paternal uncle; Lymphoma in her father; Parkinson's disease in her paternal grandmother.  ROS:   Review of Systems  Constitution: Reports significant fatigue.  Negative for decreased appetite, fever and weight gain.  HENT: Negative for congestion, ear discharge, hoarse voice and sore throat.   Eyes: Negative for discharge, redness, vision loss in right eye and visual halos.  Cardiovascular: Negative for chest pain, dyspnea on exertion, leg swelling, orthopnea and palpitations.  Respiratory: Negative for cough, hemoptysis, shortness of breath and snoring.   Endocrine: Negative for heat intolerance and polyphagia.  Hematologic/Lymphatic: Negative for bleeding problem. Does not bruise/bleed easily.  Skin: Negative for flushing, nail changes, rash and suspicious lesions.  Musculoskeletal: Negative for arthritis, joint pain, muscle cramps, myalgias, neck pain and stiffness.  Gastrointestinal: Negative for abdominal pain, bowel incontinence, diarrhea and excessive  appetite.  Genitourinary: Negative for decreased libido, genital sores and incomplete emptying.  Neurological: Negative for brief paralysis, focal weakness, headaches and loss of balance.  Psychiatric/Behavioral: Negative for altered mental status, depression and suicidal ideas.  Allergic/Immunologic: Negative for HIV exposure and persistent infections.    EKGs/Labs/Other Studies Reviewed:    The following studies were reviewed today:   EKG: None today.  Echocardiogram done on February 15, 2020 showed EF 60 to 65%.  RV was mildly enlarged, left atrium was mildly dilated.  There is a questionable echogenic mass in the RA which after further assessment was actually an artifact.  Mild aortic valve sclerosis.  Trace to mild mitral vegetation.  Mild tricuspid regurgitation was present.  Mild pulmonary hypertension was present.  Recent Labs: No results found for requested labs within last 8760 hours.  Recent Lipid Panel No results found  for: CHOL, TRIG, HDL, CHOLHDL, VLDL, LDLCALC, LDLDIRECT  Physical Exam:    VS:  BP 140/60 (BP Location: Right Arm, Patient Position: Sitting, Cuff Size: Normal)   Pulse 92   Ht 5\' 3"  (1.6 m)   Wt 173 lb (78.5 kg)   SpO2 97%   BMI 30.65 kg/m     Wt Readings from Last 3 Encounters:  02/27/20 173 lb (78.5 kg)  04/25/19 183 lb (83 kg)  09/26/18 178 lb (80.7 kg)     GEN: Well nourished, well developed in no acute distress HEENT: Normal NECK: No JVD; No carotid bruits LYMPHATICS: No lymphadenopathy CARDIAC: S1S2 noted,RRR, no murmurs, rubs, gallops RESPIRATORY:  Clear to auscultation without rales, wheezing or rhonchi  ABDOMEN: Soft, non-tender, non-distended, +bowel sounds, no guarding. EXTREMITIES: No edema, No cyanosis, no clubbing MUSCULOSKELETAL:  No deformity  SKIN: Warm and dry NEUROLOGIC:  Alert and oriented x 3, non-focal PSYCHIATRIC:  Normal affect, good insight  ASSESSMENT:    1. Essential hypertension   2. Diabetes mellitus due to  underlying condition with unspecified complications ([place])   3. Other iron deficiency anemia   4. BMI 31.0-31.9,adult   5. Generalized weakness   6. Chronic diastolic heart failure ([place])   7. Mixed hyperlipidemia    PLAN:     She complains of significant generalized weakness, she recently had blood work with her PCP which she tells me she was told yesterday was all normal.  I have not seen this blood work and going to request this for my review.  She notes that she was told her hemoglobin was 10.  She is following with her hematologist/oncologist tomorrow.  Cardiovascular standpoint, her blood pressure is stable, no chest pain, no shortness of breath.  She had questions about her previous echocardiogram which I discussed with her.  All of her questions were answered.  She is unable to exercise or do any activities mainly because of her generalized weakness.  I did recommend that the patient should probably see neurology to rule out conditions like multiple sclerosis but she tells me she had been seen earlier this year and she was told that she does not have any neurological problems.  Continue patient on her pravastatin for hyperlipidemia. Chronic diastolic heart failure she appears to be euvolemic.  Continue her Lasix as well as her other, directed medical therapy.  The patient is in agreement with the above plan. The patient left the office in stable condition.  The patient will follow up in 6 months or sooner if needed.   Medication Adjustments/Labs and Tests Ordered: Current medicines are reviewed at length with the patient today.  Concerns regarding medicines are outlined above.  No orders of the defined types were placed in this encounter.  No orders of the defined types were placed in this encounter.   Patient Instructions  Medication Instructions:  Your physician recommends that you continue on your current medications as directed. Please refer to the Current Medication list  given to you today.  *If you need a refill on your cardiac medications before your next appointment, please call your pharmacy*   Lab Work: NONE If you have labs (blood work) drawn today and your tests are completely normal, you will receive your results only by: Marland Kitchen MyChart Message (if you have MyChart) OR . A paper copy in the mail If you have any lab test that is abnormal or we need to change your treatment, we will call you to review the results.  Testing/Procedures: NONE   Follow-Up: At [facility], you and your health needs are our priority.  As part of our continuing mission to provide you with exceptional heart care, we have created designated Provider Care Teams.  These Care Teams include your primary Cardiologist (physician) and Advanced Practice Providers (APPs -  Physician Assistants and Nurse Practitioners) who all work together to provide you with the care you need, when you need it.  We recommend signing up for the patient portal called "MyChart".  Sign up information is provided on this After Visit Summary.  MyChart is used to connect with patients for Virtual Visits (Telemedicine).  Patients are able to view lab/test results, encounter notes, upcoming appointments, etc.  Non-urgent messages can be sent to your provider as well.   To learn more about what you can do with MyChart, go to [email or web address].    Your next appointment:   6 month(s)  The format for your next appointment:   In Person  Provider:   Berniece Salines, DO        Adopting a Healthy Lifestyle.  Know what a healthy weight is for you (roughly BMI <25) and aim to maintain this   Aim for 7+ servings of fruits and vegetables daily   65-80+ fluid ounces of water or unsweet tea for healthy kidneys   Limit to max 1 drink of alcohol per day; avoid smoking/tobacco   Limit animal fats in diet for cholesterol and heart health - choose grass fed whenever available   Avoid highly processed  foods, and foods high in saturated/trans fats   Aim for low stress - take time to unwind and care for your mental health   Aim for 150 min of moderate intensity exercise weekly for heart health, and weights twice weekly for bone health   Aim for 7-9 hours of sleep daily   When it comes to diets, agreement about the perfect plan isnt easy to find, even among the experts. Experts at the [place] developed an idea known as the Healthy Eating Plate. Just imagine a plate divided into logical, healthy portions.   The emphasis is on diet quality:   Load up on vegetables and fruits - one-half of your plate: Aim for color and variety, and remember that potatoes dont count.   Go for whole grains - one-quarter of your plate: Whole wheat, barley, wheat berries, quinoa, oats, brown rice, and foods made with them. If you want pasta, go with whole wheat pasta.   Protein power - one-quarter of your plate: Fish, chicken, beans, and nuts are all healthy, versatile protein sources. Limit red meat.   The diet, however, does go beyond the plate, offering a few other suggestions.   Use healthy plant oils, such as olive, canola, soy, corn, sunflower and peanut. Check the labels, and avoid partially hydrogenated oil, which have unhealthy trans fats.   If youre thirsty, drink water. Coffee and tea are good in moderation, but skip sugary drinks and limit milk and dairy products to one or two daily servings.   The type of carbohydrate in the diet is more important than the amount. Some sources of carbohydrates, such as vegetables, fruits, whole grains, and beans-are healthier than others.   Finally, stay active  Signed, Berniece Salines, DO  02/27/2020 9:30 AM    [facility]

## 2020-02-28 DIAGNOSIS — E119 Type 2 diabetes mellitus without complications: Secondary | ICD-10-CM | POA: Diagnosis not present

## 2020-02-28 DIAGNOSIS — J449 Chronic obstructive pulmonary disease, unspecified: Secondary | ICD-10-CM | POA: Diagnosis not present

## 2020-02-28 DIAGNOSIS — I10 Essential (primary) hypertension: Secondary | ICD-10-CM | POA: Diagnosis not present

## 2020-02-28 DIAGNOSIS — K760 Fatty (change of) liver, not elsewhere classified: Secondary | ICD-10-CM | POA: Diagnosis not present

## 2020-02-28 DIAGNOSIS — R040 Epistaxis: Secondary | ICD-10-CM | POA: Diagnosis not present

## 2020-02-28 DIAGNOSIS — K7460 Unspecified cirrhosis of liver: Secondary | ICD-10-CM | POA: Diagnosis not present

## 2020-02-28 DIAGNOSIS — R188 Other ascites: Secondary | ICD-10-CM | POA: Diagnosis not present

## 2020-02-28 DIAGNOSIS — D509 Iron deficiency anemia, unspecified: Secondary | ICD-10-CM | POA: Diagnosis not present

## 2020-03-04 DIAGNOSIS — E7800 Pure hypercholesterolemia, unspecified: Secondary | ICD-10-CM | POA: Diagnosis not present

## 2020-03-04 DIAGNOSIS — R040 Epistaxis: Secondary | ICD-10-CM | POA: Diagnosis not present

## 2020-03-04 DIAGNOSIS — Z794 Long term (current) use of insulin: Secondary | ICD-10-CM | POA: Diagnosis not present

## 2020-03-04 DIAGNOSIS — E119 Type 2 diabetes mellitus without complications: Secondary | ICD-10-CM | POA: Diagnosis not present

## 2020-03-04 DIAGNOSIS — Z79899 Other long term (current) drug therapy: Secondary | ICD-10-CM | POA: Diagnosis not present

## 2020-03-04 DIAGNOSIS — I10 Essential (primary) hypertension: Secondary | ICD-10-CM | POA: Diagnosis not present

## 2020-03-04 DIAGNOSIS — J449 Chronic obstructive pulmonary disease, unspecified: Secondary | ICD-10-CM | POA: Diagnosis not present

## 2020-03-06 DIAGNOSIS — K760 Fatty (change of) liver, not elsewhere classified: Secondary | ICD-10-CM | POA: Diagnosis not present

## 2020-03-06 DIAGNOSIS — I10 Essential (primary) hypertension: Secondary | ICD-10-CM | POA: Diagnosis not present

## 2020-03-06 DIAGNOSIS — D509 Iron deficiency anemia, unspecified: Secondary | ICD-10-CM | POA: Diagnosis not present

## 2020-03-06 DIAGNOSIS — E119 Type 2 diabetes mellitus without complications: Secondary | ICD-10-CM | POA: Diagnosis not present

## 2020-03-06 DIAGNOSIS — R040 Epistaxis: Secondary | ICD-10-CM | POA: Diagnosis not present

## 2020-03-06 DIAGNOSIS — J449 Chronic obstructive pulmonary disease, unspecified: Secondary | ICD-10-CM | POA: Diagnosis not present

## 2020-03-09 DIAGNOSIS — Z8774 Personal history of (corrected) congenital malformations of heart and circulatory system: Secondary | ICD-10-CM | POA: Diagnosis not present

## 2020-03-09 DIAGNOSIS — D500 Iron deficiency anemia secondary to blood loss (chronic): Secondary | ICD-10-CM | POA: Diagnosis not present

## 2020-03-09 DIAGNOSIS — R040 Epistaxis: Secondary | ICD-10-CM | POA: Diagnosis not present

## 2020-03-09 DIAGNOSIS — J342 Deviated nasal septum: Secondary | ICD-10-CM | POA: Diagnosis not present

## 2020-03-12 DIAGNOSIS — I10 Essential (primary) hypertension: Secondary | ICD-10-CM | POA: Diagnosis not present

## 2020-03-12 DIAGNOSIS — J449 Chronic obstructive pulmonary disease, unspecified: Secondary | ICD-10-CM | POA: Diagnosis not present

## 2020-03-12 DIAGNOSIS — E119 Type 2 diabetes mellitus without complications: Secondary | ICD-10-CM | POA: Diagnosis not present

## 2020-03-12 DIAGNOSIS — R040 Epistaxis: Secondary | ICD-10-CM | POA: Diagnosis not present

## 2020-03-12 DIAGNOSIS — D509 Iron deficiency anemia, unspecified: Secondary | ICD-10-CM | POA: Diagnosis not present

## 2020-03-12 DIAGNOSIS — K760 Fatty (change of) liver, not elsewhere classified: Secondary | ICD-10-CM | POA: Diagnosis not present

## 2020-03-18 DIAGNOSIS — K7460 Unspecified cirrhosis of liver: Secondary | ICD-10-CM | POA: Diagnosis not present

## 2020-03-18 DIAGNOSIS — R040 Epistaxis: Secondary | ICD-10-CM | POA: Diagnosis not present

## 2020-03-18 DIAGNOSIS — K7290 Hepatic failure, unspecified without coma: Secondary | ICD-10-CM | POA: Diagnosis not present

## 2020-03-18 DIAGNOSIS — Q2733 Arteriovenous malformation of digestive system vessel: Secondary | ICD-10-CM | POA: Diagnosis not present

## 2020-03-18 DIAGNOSIS — D509 Iron deficiency anemia, unspecified: Secondary | ICD-10-CM | POA: Diagnosis not present

## 2020-03-18 DIAGNOSIS — D500 Iron deficiency anemia secondary to blood loss (chronic): Secondary | ICD-10-CM | POA: Diagnosis not present

## 2020-03-18 DIAGNOSIS — R188 Other ascites: Secondary | ICD-10-CM | POA: Diagnosis not present

## 2020-03-20 DIAGNOSIS — K760 Fatty (change of) liver, not elsewhere classified: Secondary | ICD-10-CM | POA: Diagnosis not present

## 2020-03-20 DIAGNOSIS — D509 Iron deficiency anemia, unspecified: Secondary | ICD-10-CM | POA: Diagnosis not present

## 2020-03-20 DIAGNOSIS — J449 Chronic obstructive pulmonary disease, unspecified: Secondary | ICD-10-CM | POA: Diagnosis not present

## 2020-03-20 DIAGNOSIS — E119 Type 2 diabetes mellitus without complications: Secondary | ICD-10-CM | POA: Diagnosis not present

## 2020-03-20 DIAGNOSIS — I10 Essential (primary) hypertension: Secondary | ICD-10-CM | POA: Diagnosis not present

## 2020-03-20 DIAGNOSIS — R040 Epistaxis: Secondary | ICD-10-CM | POA: Diagnosis not present

## 2020-03-22 DIAGNOSIS — Z794 Long term (current) use of insulin: Secondary | ICD-10-CM | POA: Diagnosis not present

## 2020-03-22 DIAGNOSIS — F419 Anxiety disorder, unspecified: Secondary | ICD-10-CM | POA: Diagnosis not present

## 2020-03-22 DIAGNOSIS — F329 Major depressive disorder, single episode, unspecified: Secondary | ICD-10-CM | POA: Diagnosis not present

## 2020-03-22 DIAGNOSIS — E785 Hyperlipidemia, unspecified: Secondary | ICD-10-CM | POA: Diagnosis not present

## 2020-03-22 DIAGNOSIS — N200 Calculus of kidney: Secondary | ICD-10-CM | POA: Diagnosis not present

## 2020-03-22 DIAGNOSIS — K760 Fatty (change of) liver, not elsewhere classified: Secondary | ICD-10-CM | POA: Diagnosis not present

## 2020-03-22 DIAGNOSIS — I10 Essential (primary) hypertension: Secondary | ICD-10-CM | POA: Diagnosis not present

## 2020-03-22 DIAGNOSIS — R040 Epistaxis: Secondary | ICD-10-CM | POA: Diagnosis not present

## 2020-03-22 DIAGNOSIS — Z9181 History of falling: Secondary | ICD-10-CM | POA: Diagnosis not present

## 2020-03-22 DIAGNOSIS — Z87440 Personal history of urinary (tract) infections: Secondary | ICD-10-CM | POA: Diagnosis not present

## 2020-03-22 DIAGNOSIS — J449 Chronic obstructive pulmonary disease, unspecified: Secondary | ICD-10-CM | POA: Diagnosis not present

## 2020-03-22 DIAGNOSIS — E119 Type 2 diabetes mellitus without complications: Secondary | ICD-10-CM | POA: Diagnosis not present

## 2020-03-22 DIAGNOSIS — D509 Iron deficiency anemia, unspecified: Secondary | ICD-10-CM | POA: Diagnosis not present

## 2020-03-31 DIAGNOSIS — K760 Fatty (change of) liver, not elsewhere classified: Secondary | ICD-10-CM | POA: Diagnosis not present

## 2020-03-31 DIAGNOSIS — I10 Essential (primary) hypertension: Secondary | ICD-10-CM | POA: Diagnosis not present

## 2020-03-31 DIAGNOSIS — D509 Iron deficiency anemia, unspecified: Secondary | ICD-10-CM | POA: Diagnosis not present

## 2020-03-31 DIAGNOSIS — E119 Type 2 diabetes mellitus without complications: Secondary | ICD-10-CM | POA: Diagnosis not present

## 2020-03-31 DIAGNOSIS — J449 Chronic obstructive pulmonary disease, unspecified: Secondary | ICD-10-CM | POA: Diagnosis not present

## 2020-03-31 DIAGNOSIS — R040 Epistaxis: Secondary | ICD-10-CM | POA: Diagnosis not present

## 2020-04-09 DIAGNOSIS — D509 Iron deficiency anemia, unspecified: Secondary | ICD-10-CM | POA: Diagnosis not present

## 2020-04-09 DIAGNOSIS — D472 Monoclonal gammopathy: Secondary | ICD-10-CM | POA: Diagnosis not present

## 2020-04-09 DIAGNOSIS — K7460 Unspecified cirrhosis of liver: Secondary | ICD-10-CM | POA: Diagnosis not present

## 2020-04-09 DIAGNOSIS — R040 Epistaxis: Secondary | ICD-10-CM | POA: Diagnosis not present

## 2020-04-09 DIAGNOSIS — K7290 Hepatic failure, unspecified without coma: Secondary | ICD-10-CM | POA: Diagnosis not present

## 2020-04-09 DIAGNOSIS — Q2733 Arteriovenous malformation of digestive system vessel: Secondary | ICD-10-CM | POA: Diagnosis not present

## 2020-04-16 DIAGNOSIS — R040 Epistaxis: Secondary | ICD-10-CM | POA: Diagnosis not present

## 2020-04-16 DIAGNOSIS — E119 Type 2 diabetes mellitus without complications: Secondary | ICD-10-CM | POA: Diagnosis not present

## 2020-04-16 DIAGNOSIS — K760 Fatty (change of) liver, not elsewhere classified: Secondary | ICD-10-CM | POA: Diagnosis not present

## 2020-04-16 DIAGNOSIS — I10 Essential (primary) hypertension: Secondary | ICD-10-CM | POA: Diagnosis not present

## 2020-04-16 DIAGNOSIS — D509 Iron deficiency anemia, unspecified: Secondary | ICD-10-CM | POA: Diagnosis not present

## 2020-04-16 DIAGNOSIS — J449 Chronic obstructive pulmonary disease, unspecified: Secondary | ICD-10-CM | POA: Diagnosis not present

## 2020-04-20 DIAGNOSIS — R040 Epistaxis: Secondary | ICD-10-CM | POA: Diagnosis not present

## 2020-04-20 DIAGNOSIS — J342 Deviated nasal septum: Secondary | ICD-10-CM | POA: Diagnosis not present

## 2020-04-20 DIAGNOSIS — Z8774 Personal history of (corrected) congenital malformations of heart and circulatory system: Secondary | ICD-10-CM | POA: Diagnosis not present

## 2020-04-20 DIAGNOSIS — I10 Essential (primary) hypertension: Secondary | ICD-10-CM | POA: Diagnosis not present

## 2020-04-22 DIAGNOSIS — Q2733 Arteriovenous malformation of digestive system vessel: Secondary | ICD-10-CM | POA: Diagnosis not present

## 2020-04-22 DIAGNOSIS — K7290 Hepatic failure, unspecified without coma: Secondary | ICD-10-CM | POA: Diagnosis not present

## 2020-04-22 DIAGNOSIS — K7460 Unspecified cirrhosis of liver: Secondary | ICD-10-CM | POA: Diagnosis not present

## 2020-04-22 DIAGNOSIS — D500 Iron deficiency anemia secondary to blood loss (chronic): Secondary | ICD-10-CM | POA: Diagnosis not present

## 2020-04-22 DIAGNOSIS — R040 Epistaxis: Secondary | ICD-10-CM | POA: Diagnosis not present

## 2020-04-23 ENCOUNTER — Encounter: Payer: Self-pay | Admitting: Pharmacist

## 2020-04-24 DIAGNOSIS — D509 Iron deficiency anemia, unspecified: Secondary | ICD-10-CM | POA: Diagnosis not present

## 2020-04-27 DIAGNOSIS — J3489 Other specified disorders of nose and nasal sinuses: Secondary | ICD-10-CM | POA: Diagnosis not present

## 2020-04-27 DIAGNOSIS — R040 Epistaxis: Secondary | ICD-10-CM | POA: Diagnosis not present

## 2020-04-27 DIAGNOSIS — J342 Deviated nasal septum: Secondary | ICD-10-CM | POA: Diagnosis not present

## 2020-04-27 DIAGNOSIS — D509 Iron deficiency anemia, unspecified: Secondary | ICD-10-CM | POA: Diagnosis not present

## 2020-04-27 DIAGNOSIS — Z8774 Personal history of (corrected) congenital malformations of heart and circulatory system: Secondary | ICD-10-CM | POA: Diagnosis not present

## 2020-04-27 LAB — CBC AND DIFFERENTIAL
HCT: 30 — AB (ref 36–46)
Hemoglobin: 10 — AB (ref 12.0–16.0)
Neutrophils Absolute: 4690
Platelets: 170 (ref 150–399)
WBC: 7.1

## 2020-04-27 LAB — CBC: RBC: 3.29 — AB (ref 3.87–5.11)

## 2020-04-30 ENCOUNTER — Other Ambulatory Visit: Payer: Self-pay | Admitting: Hematology and Oncology

## 2020-04-30 ENCOUNTER — Inpatient Hospital Stay: Payer: Medicare Other

## 2020-05-01 ENCOUNTER — Other Ambulatory Visit: Payer: Self-pay

## 2020-05-01 ENCOUNTER — Inpatient Hospital Stay: Payer: Medicare Other | Attending: Oncology

## 2020-05-01 VITALS — BP 165/70 | HR 82 | Temp 98.5°F | Resp 18 | Ht 68.0 in | Wt 167.2 lb

## 2020-05-01 DIAGNOSIS — D509 Iron deficiency anemia, unspecified: Secondary | ICD-10-CM | POA: Diagnosis not present

## 2020-05-01 MED ORDER — SODIUM CHLORIDE 0.9 % IV SOLN
Freq: Once | INTRAVENOUS | Status: AC
  Filled 2020-05-01: qty 250

## 2020-05-01 MED ORDER — SODIUM CHLORIDE 0.9 % IV SOLN
510.0000 mg | Freq: Once | INTRAVENOUS | Status: AC
  Administered 2020-05-01: 510 mg via INTRAVENOUS
  Filled 2020-05-01: qty 510

## 2020-05-01 MED ORDER — SODIUM CHLORIDE 0.9% FLUSH
3.0000 mL | Freq: Once | INTRAVENOUS | Status: DC | PRN
  Filled 2020-05-01: qty 10

## 2020-05-12 DIAGNOSIS — R6889 Other general symptoms and signs: Secondary | ICD-10-CM | POA: Diagnosis not present

## 2020-05-12 DIAGNOSIS — J208 Acute bronchitis due to other specified organisms: Secondary | ICD-10-CM | POA: Diagnosis not present

## 2020-05-12 DIAGNOSIS — B9689 Other specified bacterial agents as the cause of diseases classified elsewhere: Secondary | ICD-10-CM | POA: Diagnosis not present

## 2020-05-12 DIAGNOSIS — J029 Acute pharyngitis, unspecified: Secondary | ICD-10-CM | POA: Diagnosis not present

## 2020-05-13 DIAGNOSIS — R509 Fever, unspecified: Secondary | ICD-10-CM | POA: Diagnosis not present

## 2020-05-13 DIAGNOSIS — J9811 Atelectasis: Secondary | ICD-10-CM | POA: Diagnosis not present

## 2020-05-13 DIAGNOSIS — R059 Cough, unspecified: Secondary | ICD-10-CM | POA: Diagnosis not present

## 2020-05-13 DIAGNOSIS — N1330 Unspecified hydronephrosis: Secondary | ICD-10-CM | POA: Diagnosis not present

## 2020-05-13 DIAGNOSIS — A419 Sepsis, unspecified organism: Secondary | ICD-10-CM | POA: Diagnosis not present

## 2020-05-13 DIAGNOSIS — R0902 Hypoxemia: Secondary | ICD-10-CM | POA: Diagnosis not present

## 2020-05-13 DIAGNOSIS — I700 Atherosclerosis of aorta: Secondary | ICD-10-CM | POA: Diagnosis not present

## 2020-05-13 DIAGNOSIS — N390 Urinary tract infection, site not specified: Secondary | ICD-10-CM | POA: Diagnosis not present

## 2020-05-13 DIAGNOSIS — B9689 Other specified bacterial agents as the cause of diseases classified elsewhere: Secondary | ICD-10-CM | POA: Diagnosis not present

## 2020-05-13 DIAGNOSIS — E1165 Type 2 diabetes mellitus with hyperglycemia: Secondary | ICD-10-CM | POA: Diagnosis not present

## 2020-05-13 DIAGNOSIS — R531 Weakness: Secondary | ICD-10-CM | POA: Diagnosis not present

## 2020-05-13 DIAGNOSIS — R069 Unspecified abnormalities of breathing: Secondary | ICD-10-CM | POA: Diagnosis not present

## 2020-05-13 DIAGNOSIS — R0600 Dyspnea, unspecified: Secondary | ICD-10-CM | POA: Diagnosis not present

## 2020-05-13 DIAGNOSIS — R0689 Other abnormalities of breathing: Secondary | ICD-10-CM | POA: Diagnosis not present

## 2020-05-13 DIAGNOSIS — R0602 Shortness of breath: Secondary | ICD-10-CM | POA: Diagnosis not present

## 2020-05-14 DIAGNOSIS — R509 Fever, unspecified: Secondary | ICD-10-CM | POA: Diagnosis not present

## 2020-05-14 DIAGNOSIS — I361 Nonrheumatic tricuspid (valve) insufficiency: Secondary | ICD-10-CM | POA: Diagnosis not present

## 2020-05-14 DIAGNOSIS — D509 Iron deficiency anemia, unspecified: Secondary | ICD-10-CM

## 2020-05-14 DIAGNOSIS — Z87442 Personal history of urinary calculi: Secondary | ICD-10-CM | POA: Diagnosis not present

## 2020-05-14 DIAGNOSIS — R319 Hematuria, unspecified: Secondary | ICD-10-CM | POA: Diagnosis not present

## 2020-05-14 DIAGNOSIS — F32A Depression, unspecified: Secondary | ICD-10-CM | POA: Diagnosis present

## 2020-05-14 DIAGNOSIS — K7581 Nonalcoholic steatohepatitis (NASH): Secondary | ICD-10-CM | POA: Diagnosis present

## 2020-05-14 DIAGNOSIS — I700 Atherosclerosis of aorta: Secondary | ICD-10-CM | POA: Diagnosis not present

## 2020-05-14 DIAGNOSIS — J90 Pleural effusion, not elsewhere classified: Secondary | ICD-10-CM | POA: Diagnosis not present

## 2020-05-14 DIAGNOSIS — B9689 Other specified bacterial agents as the cause of diseases classified elsewhere: Secondary | ICD-10-CM | POA: Diagnosis not present

## 2020-05-14 DIAGNOSIS — A419 Sepsis, unspecified organism: Secondary | ICD-10-CM | POA: Diagnosis not present

## 2020-05-14 DIAGNOSIS — Z79899 Other long term (current) drug therapy: Secondary | ICD-10-CM | POA: Diagnosis not present

## 2020-05-14 DIAGNOSIS — R161 Splenomegaly, not elsewhere classified: Secondary | ICD-10-CM | POA: Diagnosis present

## 2020-05-14 DIAGNOSIS — K766 Portal hypertension: Secondary | ICD-10-CM | POA: Diagnosis not present

## 2020-05-14 DIAGNOSIS — R0602 Shortness of breath: Secondary | ICD-10-CM | POA: Diagnosis not present

## 2020-05-14 DIAGNOSIS — J9811 Atelectasis: Secondary | ICD-10-CM | POA: Diagnosis not present

## 2020-05-14 DIAGNOSIS — R059 Cough, unspecified: Secondary | ICD-10-CM | POA: Diagnosis not present

## 2020-05-14 DIAGNOSIS — R188 Other ascites: Secondary | ICD-10-CM | POA: Diagnosis not present

## 2020-05-14 DIAGNOSIS — R0603 Acute respiratory distress: Secondary | ICD-10-CM | POA: Diagnosis not present

## 2020-05-14 DIAGNOSIS — N390 Urinary tract infection, site not specified: Secondary | ICD-10-CM | POA: Diagnosis not present

## 2020-05-14 DIAGNOSIS — I10 Essential (primary) hypertension: Secondary | ICD-10-CM | POA: Diagnosis not present

## 2020-05-14 DIAGNOSIS — R531 Weakness: Secondary | ICD-10-CM | POA: Diagnosis not present

## 2020-05-14 DIAGNOSIS — Z794 Long term (current) use of insulin: Secondary | ICD-10-CM | POA: Diagnosis not present

## 2020-05-14 DIAGNOSIS — E860 Dehydration: Secondary | ICD-10-CM | POA: Diagnosis present

## 2020-05-14 DIAGNOSIS — F419 Anxiety disorder, unspecified: Secondary | ICD-10-CM | POA: Diagnosis present

## 2020-05-14 DIAGNOSIS — I517 Cardiomegaly: Secondary | ICD-10-CM | POA: Diagnosis not present

## 2020-05-14 DIAGNOSIS — I340 Nonrheumatic mitral (valve) insufficiency: Secondary | ICD-10-CM | POA: Diagnosis not present

## 2020-05-14 DIAGNOSIS — E785 Hyperlipidemia, unspecified: Secondary | ICD-10-CM | POA: Diagnosis present

## 2020-05-14 DIAGNOSIS — J449 Chronic obstructive pulmonary disease, unspecified: Secondary | ICD-10-CM | POA: Diagnosis present

## 2020-05-14 DIAGNOSIS — N1330 Unspecified hydronephrosis: Secondary | ICD-10-CM | POA: Diagnosis not present

## 2020-05-14 DIAGNOSIS — E119 Type 2 diabetes mellitus without complications: Secondary | ICD-10-CM | POA: Diagnosis present

## 2020-05-14 DIAGNOSIS — K7290 Hepatic failure, unspecified without coma: Secondary | ICD-10-CM | POA: Diagnosis present

## 2020-05-14 DIAGNOSIS — A4151 Sepsis due to Escherichia coli [E. coli]: Secondary | ICD-10-CM | POA: Diagnosis not present

## 2020-05-14 DIAGNOSIS — D500 Iron deficiency anemia secondary to blood loss (chronic): Secondary | ICD-10-CM | POA: Diagnosis present

## 2020-05-15 DIAGNOSIS — R509 Fever, unspecified: Secondary | ICD-10-CM

## 2020-05-15 DIAGNOSIS — D509 Iron deficiency anemia, unspecified: Secondary | ICD-10-CM

## 2020-05-16 DIAGNOSIS — D509 Iron deficiency anemia, unspecified: Secondary | ICD-10-CM

## 2020-05-16 DIAGNOSIS — K7469 Other cirrhosis of liver: Secondary | ICD-10-CM

## 2020-05-16 DIAGNOSIS — R188 Other ascites: Secondary | ICD-10-CM

## 2020-05-16 DIAGNOSIS — K7290 Hepatic failure, unspecified without coma: Secondary | ICD-10-CM

## 2020-05-17 DIAGNOSIS — K7469 Other cirrhosis of liver: Secondary | ICD-10-CM

## 2020-05-17 DIAGNOSIS — D509 Iron deficiency anemia, unspecified: Secondary | ICD-10-CM

## 2020-05-17 DIAGNOSIS — I361 Nonrheumatic tricuspid (valve) insufficiency: Secondary | ICD-10-CM

## 2020-05-17 DIAGNOSIS — K7290 Hepatic failure, unspecified without coma: Secondary | ICD-10-CM

## 2020-05-17 DIAGNOSIS — I340 Nonrheumatic mitral (valve) insufficiency: Secondary | ICD-10-CM

## 2020-05-17 DIAGNOSIS — J90 Pleural effusion, not elsewhere classified: Secondary | ICD-10-CM

## 2020-05-17 DIAGNOSIS — R188 Other ascites: Secondary | ICD-10-CM

## 2020-05-18 DIAGNOSIS — K7469 Other cirrhosis of liver: Secondary | ICD-10-CM

## 2020-05-18 DIAGNOSIS — R188 Other ascites: Secondary | ICD-10-CM

## 2020-05-18 DIAGNOSIS — D509 Iron deficiency anemia, unspecified: Secondary | ICD-10-CM

## 2020-05-19 ENCOUNTER — Other Ambulatory Visit: Payer: Self-pay | Admitting: Oncology

## 2020-05-20 DIAGNOSIS — Z23 Encounter for immunization: Secondary | ICD-10-CM | POA: Diagnosis not present

## 2020-05-25 ENCOUNTER — Encounter: Payer: Self-pay | Admitting: Oncology

## 2020-05-25 ENCOUNTER — Inpatient Hospital Stay: Payer: Medicare Other

## 2020-05-25 ENCOUNTER — Inpatient Hospital Stay: Payer: Medicare Other | Attending: Oncology | Admitting: Oncology

## 2020-05-25 ENCOUNTER — Telehealth: Payer: Self-pay

## 2020-05-25 ENCOUNTER — Other Ambulatory Visit: Payer: Self-pay

## 2020-05-25 VITALS — BP 174/70 | HR 79 | Temp 97.9°F | Resp 18 | Ht 63.5 in | Wt 167.1 lb

## 2020-05-25 DIAGNOSIS — Q2733 Arteriovenous malformation of digestive system vessel: Secondary | ICD-10-CM | POA: Diagnosis not present

## 2020-05-25 DIAGNOSIS — Z79899 Other long term (current) drug therapy: Secondary | ICD-10-CM | POA: Diagnosis not present

## 2020-05-25 DIAGNOSIS — R188 Other ascites: Secondary | ICD-10-CM | POA: Diagnosis not present

## 2020-05-25 DIAGNOSIS — K7460 Unspecified cirrhosis of liver: Secondary | ICD-10-CM | POA: Diagnosis not present

## 2020-05-25 DIAGNOSIS — J449 Chronic obstructive pulmonary disease, unspecified: Secondary | ICD-10-CM | POA: Diagnosis not present

## 2020-05-25 DIAGNOSIS — R531 Weakness: Secondary | ICD-10-CM | POA: Insufficient documentation

## 2020-05-25 DIAGNOSIS — D500 Iron deficiency anemia secondary to blood loss (chronic): Secondary | ICD-10-CM

## 2020-05-25 DIAGNOSIS — E088 Diabetes mellitus due to underlying condition with unspecified complications: Secondary | ICD-10-CM | POA: Diagnosis not present

## 2020-05-25 DIAGNOSIS — K7031 Alcoholic cirrhosis of liver with ascites: Secondary | ICD-10-CM

## 2020-05-25 DIAGNOSIS — D6959 Other secondary thrombocytopenia: Secondary | ICD-10-CM | POA: Diagnosis not present

## 2020-05-25 DIAGNOSIS — K7682 Hepatic encephalopathy: Secondary | ICD-10-CM

## 2020-05-25 DIAGNOSIS — D509 Iron deficiency anemia, unspecified: Secondary | ICD-10-CM | POA: Diagnosis not present

## 2020-05-25 DIAGNOSIS — K7290 Hepatic failure, unspecified without coma: Secondary | ICD-10-CM | POA: Insufficient documentation

## 2020-05-25 DIAGNOSIS — R040 Epistaxis: Secondary | ICD-10-CM | POA: Diagnosis not present

## 2020-05-25 LAB — IRON AND TIBC
Iron: 78 ug/dL (ref 28–170)
Saturation Ratios: 25 % (ref 10.4–31.8)
TIBC: 318 ug/dL (ref 250–450)
UIBC: 240 ug/dL

## 2020-05-25 LAB — FERRITIN: Ferritin: 205 ng/mL (ref 11–307)

## 2020-05-25 LAB — MISCELLANEOUS TEST

## 2020-05-25 LAB — CBC W DIFFERENTIAL (~~LOC~~ CC SCANNED REPORT)

## 2020-05-25 NOTE — Telephone Encounter (Signed)
Cassie in [facility] lab called Ammonia Level 10.0. Dr. Hinton Rao made aware.

## 2020-05-25 NOTE — Progress Notes (Signed)
[place]  [address] [place],  [place]  [address] [phone]  Clinic Day:  05/25/2020  Referring physician: Nicoletta Dress, MD   This document serves as a record of services personally performed by Hosie Poisson, MD. It was created on their behalf by [facility], a trained medical scribe. The creation of this record is based on the scribe's personal observations and the provider's statements to them.   CHIEF COMPLAINT:  CC: Iron deficiency anemia  Current Treatment:  Iron supplement  HISTORY OF PRESENT ILLNESS:  [name] is a 69 y.o. female who we began seeing in December 2019 for iron deficiency anemia.  She was found to have colon polyps with pathology showing tubular adenomas in November 2018.  She also had upper endoscopy in January 2018 with no specific findings.  She was clearly iron deficient.  She has multiple comorbidities, including diabetes, liver cirrhosis, COPD, hyperlipidemia, and degenerative disc disease.  Testing for any other deficiency or monoclonal spike was negative.  She had already been on oral supplement for nearly 1 year with lack of response, so was given IV iron in the form of Feraheme.  She felt somewhat better after the IV iron, but still complained of fatigue and weakness.  She was once again given IV Feraheme in June.  Her hemoglobin improved, but was still low at 11.0.  She underwent colonoscopy and EGD with biopsy in August 2020 with Dr. Noberto Retort, which was negative.  On September 1st her hemoglobin had dropped from 9.8 to 9.0 through Dr. Delena Bali.  When she was seen here on September 24th, the hemoglobin had dropped down to 7.5, so she was transfused with 2 units of packed red blood cells.  Repeat iron studies once again revealed iron deficiency.  She was given IV Feraheme again.  CT of abdomen and pelvis was negative.  She felt so much better after the transfusion that she jumped out of bed and  fractured her right foot in a fall.  She had a drop in her hemoglobin again in November and  was given IV Feraheme once more.  Capsule endoscopy in November 2020 in [facility] revealed multiple AVM's of the small bowel.    Her hemoglobin with Dr. Delena Bali was 9.6 on March 8th 2021, but decreased down to 7.6 on March 31st.  She therefore received IV Feraheme again in early April. She had cauterization of an AVM of the small bowel at [facility] in February 2021.  Abdominal ultrasound from May 20th showed cirrhosis of the liver with nodular surface, increased echogenicity and heterogeneity.  No focal liver parenchymal lesion was observed.  She has had intermittent mild thrombocytopenia, presumably from her liver cirrhosis. She received IV iron in early June, but only received one dose due to her trip to [place], she also received 2 units of packed red blood cells at that time. She presented to the [facility] emergency department on June 13th due to generalized weakness that had been ongoing for 1 week, as well as altered mental status, and was admitted.  She had just returned from a trip to [place].  While out there, she developed severe epistaxis that required packing and 1 unit of packed red blood cells.  CT imaging revealed small amount of blood in the right maxillary and sphenoid sinuses with tube placed in the right paranasal sinus.  She was found to have a UTI.  Her hemoglobin improved from 9.0 in early June to  9.7.   She developed a severe epistaxis for the 3rd time this year, requiring hospital admission in late July and had a nasal bullet in place.  Her hemoglobin dropped down to 6.9 and she has been transfused, and given a dose of IV Feraheme while in the hospital.  Her hemoglobin was still only 8.2 on July 25th, but slowly came up after that.  She had an episode of hypotension and fever and was felt to be septic and transferred to the ICU.  She was placed on broad-spectrum IV antibiotics, and cultures  remained negative.  An echocardiogram from July 27th showed a suspicious mass in the right atrium, which may be attached to the interatrial septum.  The cardiologist reviewed the echocardiogram and felt that this was likely an artifact and not a mass, vegetation or thrombus.  The patient continued to complain of pain from the compression device in her nose and described some green drainage from her sinuses, potentially the source of her fever and sepsis.  Her hemoglobin had come up to 8.7 by the time of discharge on July 27th.  She was seen on August 6th for follow up and reported weakness of her hands and and shakiness of her arms and legs.  She was occasionally dropping objects.  She also reported fatigue, dyspnea with exertion and moderate abdominal swelling.  She had a recent follow-up at Dr. Julien Nordmann office with a good cardiac report.  Due to the asterixis, an ammonia level was obtained, and was elevated at 62.  She was therefore placed on lactulose 30 mL twice a day. She was seen a week later with some improvement, but her serum ammonia was still 54, so the lactulose was increased to TID. She also had hyperkalemia, so her spironolactone 25 mg was held. We now have resumed one pill daily along with Lasix 40 mg daily due to recurrent ascites.  She was transfused with 2 units of PRBCs again at the end of September when her hemoglobin dropped down to 7.3.  Iron level is 13.0 with a TIBC of 415 for a saturation of 3.1%. She was scheduled for 2 more doses of IV Feraheme.   INTERVAL HISTORY:  She was admitted on October 20th with a fever of 105 and had 2 positive blood cultures for E coli.  The organism was resistant to Ampicillin and the patient was on ceftriaxone and finally responding.  She complained of chills and her hemoglobin dropped from 10.7 to 9.8.  She had increasing ascites and respiratory distress with a chest x-ray consistent with fluid overload. I had held her usual Lasix and spironolactone for  2-3 days due to sepsis and dehydration but this was resumed.    I also placed her lactulose on hold due to severe diarrhea, adding to her dehydration. Her serum ammonia came down to 9 we did not resume lactulose at that time.  She did have a new splenic infarction as of this admission, which was causing a lot of left abdominal pain. She was discharged on October 25th on Levaquin 750 mg daily #5.  She is now here for follow up and reports increased drowsiness, especially yesterday.  She took a dose of lactulose last night as she did not feel right, but has otherwise not taken the medication as recommended.  She continues Lasix 40 mg daily and spironolactone 50 mg BID, however her abdomen remains swollen.  Her blood sugar has not been well controlled since her hospital discharge, as she was given  sliding scale insulin from the hosptial.  She is scheduled with Dr. Delena Bali tomorrow.  Her last epistaxis episode was at 3 AM this morning.  Her hemoglobin today is 9.7, previously 10.0 at the time of discharge, and her white count and platelets are normal.  Serum ammonia is 10, so I advise that she resume lactulose once daily.  Chemistries are unremarkable, including her liver function tests.  Her appetite is good, and she has lost 1 and 1/2 pounds since her last visit.  She denies fever, chills or other signs of infection.  She denies nausea, vomiting, bowel issues, or abdominal pain.  She denies sore throat, cough, dyspnea, or chest pain.   REVIEW OF SYSTEMS:  Review of Systems  Constitutional: Positive for fatigue.  Gastrointestinal: Positive for abdominal distention.  All other systems reviewed and are negative.    VITALS:  Blood pressure (!) 174/70, pulse 79, temperature 97.9 F (36.6 C), resp. rate 18, height 5' 3.5" (1.613 m), weight 167 lb 1.6 oz (75.8 kg), SpO2 98 %.  Wt Readings from Last 3 Encounters:  05/25/20 167 lb 1.6 oz (75.8 kg)  05/11/20 168 lb 8 oz (76.4 kg)  05/11/20 168 lb 5 oz (76.3 kg)      Body mass index is 29.14 kg/m.  Performance status (ECOG): 1 - Symptomatic but completely ambulatory  PHYSICAL EXAM:  Physical Exam Constitutional:      Appearance: Normal appearance. She is normal weight.  HENT:     Head: Normocephalic and atraumatic.  Eyes:     General: No scleral icterus.    Extraocular Movements: Extraocular movements intact.     Conjunctiva/sclera: Conjunctivae normal.     Pupils: Pupils are equal, round, and reactive to light.  Cardiovascular:     Rate and Rhythm: Normal rate and regular rhythm.     Pulses: Normal pulses.     Heart sounds: Normal heart sounds. No murmur heard.  No friction rub. No gallop.   Pulmonary:     Effort: Pulmonary effort is normal.     Breath sounds: Normal breath sounds.  Abdominal:     General: Bowel sounds are normal. There is distension (with moderate ascites).     Palpations: Abdomen is soft. There is splenomegaly. There is no mass.     Tenderness: There is no abdominal tenderness.  Musculoskeletal:        General: Normal range of motion.     Cervical back: Neck supple.     Right lower leg: No edema.     Left lower leg: No edema.  Lymphadenopathy:     Cervical: No cervical adenopathy.  Skin:    General: Skin is warm and dry.     Coloration: Skin is pale.  Neurological:     General: No focal deficit present.     Mental Status: She is alert and oriented to person, place, and time. Mental status is at baseline.     LABS:   Labs from 05/25/2020 revealed a normal CBC except for a hemoglobin of 9.7, which is holding.  Hemoglobin A1c was 6.6, and serum ammonia was 10.0.  Chemistries are unremarkable.   CBC Latest Ref Rng & Units 04/27/2020 04/17/2011 04/16/2011  WBC - 7.1 9.5 12.2(H)  Hemoglobin 12.0 - 16.0 10.0(A) 11.8(L) 11.2(L)  Hematocrit 36 - 46 30(A) 36.7 35.2(L)  Platelets 150 - 399 170 269 241   CMP Latest Ref Rng & Units 04/18/2011 04/17/2011 04/14/2011  Glucose 70 - 99 mg/dL 143(H) 132(H) 173(H)  BUN  6 -  23 mg/dL 8 7 5(L)  Creatinine 0.50 - 1.10 mg/dL <0.47(L) <0.47(L) <0.47(L)  Sodium 135 - 145 mEq/L 141 142 136  Potassium 3.5 - 5.1 mEq/L 3.7 3.3(L) 3.7  Chloride 96 - 112 mEq/L 102 99 96  CO2 19 - 32 mEq/L 31 36(H) 33(H)  Calcium 8.4 - 10.5 mg/dL 9.4 9.3 9.5  Total Protein 6.0 - 8.3 g/dL - - 7.0  Total Bilirubin 0.3 - 1.2 mg/dL - - 1.0  Alkaline Phos 39 - 117 U/L - - 87  AST 0 - 37 U/L - - 18  ALT 0 - 35 U/L - - 34    Lab Results  Component Value Date   TIBC 318 05/25/2020   FERRITIN 205 05/25/2020   IRONPCTSAT 25 05/25/2020    STUDIES:  No results found.   Allergies: No Known Allergies  Current Medications: Current Outpatient Medications  Medication Sig Dispense Refill  . colesevelam (WELCHOL) 625 MG tablet Take 1,875 mg by mouth 2 (two) times daily.    . furosemide (LASIX) 40 MG tablet Take 1 tablet by mouth daily.    . insulin aspart (NOVOLOG) 100 UNIT/ML FlexPen Inject 30 Units into the skin 2 (two) times daily.    Marland Kitchen LEVEMIR FLEXTOUCH 100 UNIT/ML Pen Inject 40 Units into the skin 2 (two) times daily.  12  . levothyroxine (SYNTHROID) 50 MCG tablet Take 50 mcg by mouth daily.    Marland Kitchen liraglutide (VICTOZA) 18 MG/3ML SOPN Inject 1.8 mg into the skin daily.     . metFORMIN (GLUCOPHAGE-XR) 500 MG 24 hr tablet Take 500 mg by mouth 2 (two) times daily with a meal.  5  . pregabalin (LYRICA) 75 MG capsule Take 75 mg by mouth at bedtime.  1  . rOPINIRole (REQUIP) 2 MG tablet Take 1 tablet by mouth at bedtime.  3  . spironolactone (ALDACTONE) 25 MG tablet Take 25 mg by mouth 2 (two) times daily.    . Vitamin D, Ergocalciferol, (DRISDOL) 1.25 MG (50000 UT) CAPS capsule Take 1 capsule by mouth once a week.    Marland Kitchen lisinopril (PRINIVIL,ZESTRIL) 40 MG tablet Take 1 tablet by mouth daily. (Patient not taking: Reported on 05/25/2020)  3  . montelukast (SINGULAIR) 10 MG tablet Take 10 mg by mouth at bedtime. (Patient not taking: Reported on 05/25/2020)    . nystatin (MYCOSTATIN/NYSTOP) powder   (Patient not taking: Reported on 05/25/2020)    . omeprazole (PRILOSEC) 20 MG capsule Take 1 capsule by mouth daily. (Patient not taking: Reported on 05/25/2020)  3  . pravastatin (PRAVACHOL) 20 MG tablet Take 1 tablet by mouth once a week. (Patient not taking: Reported on 05/25/2020)     No current facility-administered medications for this visit.     ASSESSMENT & PLAN:   Assessment:  1. Iron deficiency, secondary to chronic GI blood loss from AVM's and recurrent epistaxis.    2. Liver cirrhosis, with intermittent mild thrombocytopenia.  Her anemia is also likely partly related to the cirrhosis.  3. Ascites, for which she is on spironolactone and furosemide.  This appears stable and she will stay on one pill of each.  4. Hepatic encephalopathy.  She has normalization of the ammonia.  The lactulose was held while she was in the hospital, and I advise that she resume this at once daily.  5. Recurrent epistaxis.  She has been seen by Dr. Gaylyn Cheers who packed the area and noted an AVM there.      6. Multiple AVM's  of the small bowel seen on capsule endoscopy in [facility], treated at [facility] in February.   Plan: As her serum ammonia is 10.0, I advise that she resume lactulose at once daily at this time.  She is scheduled with Dr. Delena Bali tomorrow, and we will add an A1c to her labs today.  We will see her back in 2 weeks with CBC, CMP, serum ammonia, iron and TIBC for evaluation.  I discussed the assessment and treatment plan with the patient.  The patient was provided an opportunity to ask questions and all were answered.  The patient agreed with the plan and demonstrated an understanding of the instructions.  The patient was advised to call back if the symptoms worsen or if the condition fails to improve as anticipated.   I provided 30 minutes of face-to-face time during this this encounter and > 50% was spent counseling as documented under my assessment and plan.    Derwood Kaplan, MD [facility] [place]   I, Rita Ohara, am acting as scribe for Derwood Kaplan, MD  I have reviewed this report as typed by the medical scribe, and it is complete and accurate.

## 2020-05-26 ENCOUNTER — Telehealth: Payer: Self-pay | Admitting: Oncology

## 2020-05-26 DIAGNOSIS — E559 Vitamin D deficiency, unspecified: Secondary | ICD-10-CM | POA: Diagnosis not present

## 2020-05-26 DIAGNOSIS — D509 Iron deficiency anemia, unspecified: Secondary | ICD-10-CM | POA: Diagnosis not present

## 2020-05-26 DIAGNOSIS — Z1231 Encounter for screening mammogram for malignant neoplasm of breast: Secondary | ICD-10-CM | POA: Diagnosis not present

## 2020-05-26 DIAGNOSIS — A4151 Sepsis due to Escherichia coli [E. coli]: Secondary | ICD-10-CM | POA: Diagnosis not present

## 2020-05-26 DIAGNOSIS — E785 Hyperlipidemia, unspecified: Secondary | ICD-10-CM | POA: Diagnosis not present

## 2020-05-26 DIAGNOSIS — K7460 Unspecified cirrhosis of liver: Secondary | ICD-10-CM | POA: Diagnosis not present

## 2020-05-26 DIAGNOSIS — Z794 Long term (current) use of insulin: Secondary | ICD-10-CM | POA: Diagnosis not present

## 2020-05-26 DIAGNOSIS — R809 Proteinuria, unspecified: Secondary | ICD-10-CM | POA: Diagnosis not present

## 2020-05-26 DIAGNOSIS — E1129 Type 2 diabetes mellitus with other diabetic kidney complication: Secondary | ICD-10-CM | POA: Diagnosis not present

## 2020-05-26 DIAGNOSIS — E039 Hypothyroidism, unspecified: Secondary | ICD-10-CM | POA: Diagnosis not present

## 2020-05-26 DIAGNOSIS — M810 Age-related osteoporosis without current pathological fracture: Secondary | ICD-10-CM | POA: Diagnosis not present

## 2020-05-26 DIAGNOSIS — K7581 Nonalcoholic steatohepatitis (NASH): Secondary | ICD-10-CM | POA: Diagnosis not present

## 2020-05-26 DIAGNOSIS — E1165 Type 2 diabetes mellitus with hyperglycemia: Secondary | ICD-10-CM | POA: Diagnosis not present

## 2020-05-26 NOTE — Telephone Encounter (Signed)
Sched appt per LOS 11/1.PT CONFIRMED APPTS

## 2020-06-04 NOTE — Progress Notes (Signed)
[facility]  [address] [place],  [place]  [address] [phone]  Clinic Day:  06/08/2020  Referring physician: Nicoletta Dress, MD   This document serves as a record of services personally performed by Hosie Poisson, MD. It was created on their behalf by [facility] E, a trained medical scribe. The creation of this record is based on the scribe's personal observations and the provider's statements to them.   CHIEF COMPLAINT:  CC: Iron deficiency anemia  Current Treatment:  Iron supplement  HISTORY OF PRESENT ILLNESS:  [name] is a 69 y.o. female who we began seeing in December 2019 for iron deficiency anemia.  She was found to have colon polyps with pathology showing tubular adenomas in November 2018.  She also had upper endoscopy in January 2018 with no specific findings.  She was clearly iron deficient.  She has multiple comorbidities, including diabetes, liver cirrhosis, COPD, hyperlipidemia, and degenerative disc disease.  Testing for any other deficiency or monoclonal spike was negative.  She had already been on oral supplement for nearly 1 year with lack of response, so was given IV iron in the form of Feraheme.  She felt somewhat better after the IV iron, but still complained of fatigue and weakness.  She was once again given IV Feraheme in June.  Her hemoglobin improved, but was still low at 11.0.  She underwent colonoscopy and EGD with biopsy in August 2020 with Dr. Noberto Retort, which was negative.  On September 1st her hemoglobin had dropped from 9.8 to 9.0 through Dr. Delena Bali.  When she was seen here on September 24th, the hemoglobin had dropped down to 7.5, so she was transfused with 2 units of packed red blood cells.  Repeat iron studies once again revealed iron deficiency.  She was given IV Feraheme again.  CT of abdomen and pelvis was negative.  She felt so much better after the transfusion that she jumped out of bed and  fractured her right foot in a fall.  She had a drop in her hemoglobin again in November and  was given IV Feraheme once more.  Capsule endoscopy in November 2020 in [facility] revealed multiple AVM's of the small bowel.    Her hemoglobin with Dr. Delena Bali was 9.6 on March 8th 2021, but decreased down to 7.6 on March 31st.  She therefore received IV Feraheme again in early April. She had cauterization of an AVM of the small bowel at [facility] in February 2021.  Abdominal ultrasound from May 20th showed cirrhosis of the liver with nodular surface, increased echogenicity and heterogeneity.  No focal liver parenchymal lesion was observed.  She has had intermittent mild thrombocytopenia, presumably from her liver cirrhosis. She received IV iron in early June, but only received one dose due to her trip to [place], she also received 2 units of packed red blood cells at that time. She presented to the [facility] emergency department on June 13th due to generalized weakness that had been ongoing for 1 week, as well as altered mental status, and was admitted.  She had just returned from a trip to [place].  While out there, she developed severe epistaxis that required packing and 1 unit of packed red blood cells.  CT imaging revealed small amount of blood in the right maxillary and sphenoid sinuses with tube placed in the right paranasal sinus.  She was found to have a UTI.  Her hemoglobin improved from 9.0 in early June to  9.7.   She developed a severe epistaxis for the 3rd time this year, requiring hospital admission in late July and had a nasal bullet in place.  Her hemoglobin dropped down to 6.9 and she has been transfused, and given a dose of IV Feraheme while in the hospital.  Her hemoglobin was still only 8.2 on July 25th, but slowly came up after that.  She had an episode of hypotension and fever and was felt to be septic and transferred to the ICU.  She was placed on broad-spectrum IV antibiotics, and cultures  remained negative.  An echocardiogram from July 27th showed a suspicious mass in the right atrium, which may be attached to the interatrial septum.  The cardiologist reviewed the echocardiogram and felt that this was likely an artifact and not a mass, vegetation or thrombus.  The patient continued to complain of pain from the compression device in her nose and described some green drainage from her sinuses, potentially the source of her fever and sepsis.  Her hemoglobin had come up to 8.7 by the time of discharge on July 27th.  She was seen on August 6th for follow up and reported weakness of her hands and and shakiness of her arms and legs.  She was occasionally dropping objects.  She also reported fatigue, dyspnea with exertion and moderate abdominal swelling.  She had a recent follow-up at Dr. Julien Nordmann office with a good cardiac report.  Due to the asterixis, an ammonia level was obtained, and was elevated at 62.  She was therefore placed on lactulose 30 mL twice a day. She was seen a week later with some improvement, but her serum ammonia was still 54, so the lactulose was increased to TID. She also had hyperkalemia, so her spironolactone 25 mg was held. We now have resumed one pill daily along with Lasix 40 mg daily due to recurrent ascites.  She was transfused with 2 units of PRBCs again at the end of September when her hemoglobin dropped down to 7.3.  Iron level is 13.0 with a TIBC of 415 for a saturation of 3.1%. She was scheduled for 2 more doses of IV Feraheme.  She was admitted on October 20th with a fever of 105 and had E coli sepsis, resistant to Ampicillin only.  I placed her lactulose on hold due to severe diarrhea, adding to her dehydration. Her serum ammonia came down to 9 we did not resume lactulose at that time.  She did have a new splenic infarction as of this admission, which was causing a lot of left abdominal pain. She was discharged on October 25th on Levaquin 750 mg daily #5.      INTERVAL HISTORY:  She is here for routine follow up and did have an episode of hemoptysis this morning, but feels this was due to epistaxis.  Otherwise, she has been fairly well. She has not been taking lactulose regularly, so I advised that she resume 30 cc daily.  She continues Lasix 40 mg daily and spironolactone 25 mg twice daily.  Her hemoglobin is holding at 9.8, and her white count and platelets are normal.  Chemistries are unremarkable.  Her  appetite is good, and she has gained 1 and 1/2 pounds since her last visit.  She denies fever, chills or other signs of infection.  She denies nausea, vomiting, bowel issues, or abdominal pain.  She denies sore throat, cough, dyspnea, or chest pain.   REVIEW OF SYSTEMS:  Review of Systems  Constitutional:  Positive for fatigue.  Respiratory: Positive for hemoptysis (this morning).   Gastrointestinal: Positive for abdominal pain (mild in the left upper quadrant).  All other systems reviewed and are negative.    VITALS:  Blood pressure (!) 185/77, pulse 76, temperature 98.2 F (36.8 C), resp. rate 18, height 5' 3.5" (1.613 m), weight 168 lb 11.2 oz (76.5 kg), SpO2 97 %.  Wt Readings from Last 3 Encounters:  06/08/20 168 lb 11.2 oz (76.5 kg)  06/08/20 168 lb 11.2 oz (76.5 kg)  05/25/20 167 lb 1.6 oz (75.8 kg)    Body mass index is 29.42 kg/m.  Performance status (ECOG): 1 - Symptomatic but completely ambulatory  PHYSICAL EXAM:  Physical Exam Constitutional:      General: She is not in acute distress.    Appearance: Normal appearance. She is normal weight.  HENT:     Head: Normocephalic and atraumatic.  Eyes:     General: No scleral icterus.    Extraocular Movements: Extraocular movements intact.     Conjunctiva/sclera: Conjunctivae normal.     Pupils: Pupils are equal, round, and reactive to light.  Cardiovascular:     Rate and Rhythm: Normal rate and regular rhythm.     Pulses: Normal pulses.     Heart sounds: Normal heart  sounds. No murmur heard.  No friction rub. No gallop.   Pulmonary:     Effort: Pulmonary effort is normal. No respiratory distress.     Breath sounds: Wheezing (expiratory) present.  Abdominal:     General: Bowel sounds are normal.     Palpations: Abdomen is soft. There is no mass.     Tenderness: There is abdominal tenderness in the left upper quadrant.     Comments: She has mild to moderate ascites.  She has increased firmness of the left lobe of the liver.  Musculoskeletal:        General: Normal range of motion.     Cervical back: Normal range of motion and neck supple.     Right lower leg: No edema.     Left lower leg: No edema.  Lymphadenopathy:     Cervical: No cervical adenopathy.  Skin:    General: Skin is warm and dry.  Neurological:     General: No focal deficit present.     Mental Status: She is alert and oriented to person, place, and time. Mental status is at baseline.  Psychiatric:        Mood and Affect: Mood normal.        Behavior: Behavior normal.        Thought Content: Thought content normal.        Judgment: Judgment normal.     LABS:   Labs from 06/08/2020 revealed a normal CBC except for a hemoglobin of 9.8, which is holding.  Chemistries are unremarkable except for a mildly elevated SGOT of 40.   CBC Latest Ref Rng & Units 06/08/2020 04/27/2020 04/17/2011  WBC - 4.1 7.1 9.5  Hemoglobin 12.0 - 16.0 9.8(A) 10.0(A) 11.8(L)  Hematocrit 36 - 46 30(A) 30(A) 36.7  Platelets 150 - 399 131(A) 170 269   CMP Latest Ref Rng & Units 06/08/2020 04/18/2011 04/17/2011  Glucose 70 - 99 mg/dL - 143(H) 132(H)  BUN 4 - 21 9 8 7   Creatinine 0.5 - 1.1 0.7 <0.47(L) <0.47(L)  Sodium 137 - 147 143 141 142  Potassium 3.4 - 5.3 3.7 3.7 3.3(L)  Chloride 99 - 108 109(A) 102 99  CO2 13 -  22 25(A) 31 36(H)  Calcium 8.7 - 10.7 9.3 9.4 9.3  Total Protein 6.0 - 8.3 g/dL - - -  Total Bilirubin 0.3 - 1.2 mg/dL - - -  Alkaline Phos 25 - 125 72 - -  AST 13 - 35 40(A) - -  ALT 7 -  35 19 - -    Lab Results  Component Value Date   TIBC 318 05/25/2020   FERRITIN 205 05/25/2020   IRONPCTSAT 25 05/25/2020    STUDIES:  No results found.   Allergies: No Known Allergies  Current Medications: Current Outpatient Medications  Medication Sig Dispense Refill  . colesevelam (WELCHOL) 625 MG tablet Take 1,875 mg by mouth 2 (two) times daily.    . furosemide (LASIX) 40 MG tablet Take 1 tablet by mouth daily.    . insulin aspart (NOVOLOG) 100 UNIT/ML FlexPen Inject 30 Units into the skin 2 (two) times daily.    Marland Kitchen lactulose (CHRONULAC) 10 GM/15ML solution Take 20 g by mouth 1 day or 1 dose.  5  . LEVEMIR FLEXTOUCH 100 UNIT/ML Pen Inject 40 Units into the skin 2 (two) times daily.  12  . levothyroxine (SYNTHROID) 50 MCG tablet Take 50 mcg by mouth daily.    Marland Kitchen liraglutide (VICTOZA) 18 MG/3ML SOPN Inject 1.8 mg into the skin daily.     Marland Kitchen lisinopril (PRINIVIL,ZESTRIL) 40 MG tablet Take 1 tablet by mouth daily. (Patient not taking: Reported on 05/25/2020)  3  . metFORMIN (GLUCOPHAGE-XR) 500 MG 24 hr tablet Take 500 mg by mouth 2 (two) times daily with a meal.  5  . montelukast (SINGULAIR) 10 MG tablet Take 10 mg by mouth at bedtime. (Patient not taking: Reported on 05/25/2020)    . nystatin (MYCOSTATIN/NYSTOP) powder  (Patient not taking: Reported on 05/25/2020)    . omeprazole (PRILOSEC) 20 MG capsule Take 1 capsule by mouth daily. (Patient not taking: Reported on 05/25/2020)  3  . pravastatin (PRAVACHOL) 20 MG tablet Take 1 tablet by mouth once a week. (Patient not taking: Reported on 05/25/2020)    . pregabalin (LYRICA) 75 MG capsule Take 75 mg by mouth at bedtime.  1  . rOPINIRole (REQUIP) 2 MG tablet Take 1 tablet by mouth at bedtime.  3  . spironolactone (ALDACTONE) 25 MG tablet Take 25 mg by mouth 2 (two) times daily.    . valACYclovir (VALTREX) 1000 MG tablet Take 1,000 mg by mouth 2 (two) times daily.    . Vitamin D, Ergocalciferol, (DRISDOL) 1.25 MG (50000 UT) CAPS capsule  Take 1 capsule by mouth once a week.     No current facility-administered medications for this visit.     ASSESSMENT & PLAN:   Assessment:  1. Iron deficiency, secondary to chronic GI blood loss from AVM's and recurrent epistaxis.    2. Non-alcoholic liver cirrhosis, with intermittent mild thrombocytopenia.  Her anemia is also likely partly related to the cirrhosis.  3. Ascites, for which she is on spironolactone and furosemide.  This appears stable and she will stay on one pill of each.  4. Hepatic encephalopathy.  She has normalization of the ammonia.  The lactulose was held while she was in the hospital, and I advise that she resume this at once daily.  5. Recurrent epistaxis.  She has been seen by Dr. Gaylyn Cheers who packed the area and noted an AVM there.      6. Multiple AVM's of the small bowel seen on capsule endoscopy in [facility], treated at [facility] in February.  7.  Splenic infarct.  Her pain is improving now.   Plan: I advise that she resume lactulose 30 cc once daily at this time.  She knows to continue Lasix 40 mg daily and spironolactone 25 mg twice daily.  We will see her back in 3-4 weeks with CBC, CMP and serum ammonia for evaluation.  I discussed the assessment and treatment plan with the patient.  The patient was provided an opportunity to ask questions and all were answered.  The patient agreed with the plan and demonstrated an understanding of the instructions.  The patient was advised to call back if the symptoms worsen or if the condition fails to improve as anticipated.   I provided 20 minutes of face-to-face time during this this encounter and > 50% was spent counseling as documented under my assessment and plan.    Derwood Kaplan, MD [facility]   I, Rita Ohara, am acting as scribe for Derwood Kaplan, MD  I have reviewed this report as typed by the medical scribe,  and it is complete and accurate.

## 2020-06-08 ENCOUNTER — Inpatient Hospital Stay (INDEPENDENT_AMBULATORY_CARE_PROVIDER_SITE_OTHER): Payer: Medicare Other | Admitting: Oncology

## 2020-06-08 ENCOUNTER — Other Ambulatory Visit: Payer: Self-pay

## 2020-06-08 ENCOUNTER — Inpatient Hospital Stay: Payer: Medicare Other | Admitting: Hematology and Oncology

## 2020-06-08 ENCOUNTER — Other Ambulatory Visit: Payer: Self-pay | Admitting: Oncology

## 2020-06-08 VITALS — BP 185/77 | HR 76 | Temp 98.2°F | Resp 18 | Ht 63.5 in | Wt 168.7 lb

## 2020-06-08 DIAGNOSIS — K7290 Hepatic failure, unspecified without coma: Secondary | ICD-10-CM

## 2020-06-08 DIAGNOSIS — D649 Anemia, unspecified: Secondary | ICD-10-CM | POA: Diagnosis not present

## 2020-06-08 DIAGNOSIS — D500 Iron deficiency anemia secondary to blood loss (chronic): Secondary | ICD-10-CM

## 2020-06-08 DIAGNOSIS — Z0001 Encounter for general adult medical examination with abnormal findings: Secondary | ICD-10-CM | POA: Diagnosis not present

## 2020-06-08 DIAGNOSIS — K7682 Hepatic encephalopathy: Secondary | ICD-10-CM

## 2020-06-08 LAB — HEPATIC FUNCTION PANEL
ALT: 19 (ref 7–35)
AST: 40 — AB (ref 13–35)
Alkaline Phosphatase: 72 (ref 25–125)
Bilirubin, Total: 1

## 2020-06-08 LAB — BASIC METABOLIC PANEL
BUN: 9 (ref 4–21)
CO2: 25 — AB (ref 13–22)
Chloride: 109 — AB (ref 99–108)
Creatinine: 0.7 (ref 0.5–1.1)
Glucose: 181
Potassium: 3.7 (ref 3.4–5.3)
Sodium: 143 (ref 137–147)

## 2020-06-08 LAB — CBC: RBC: 3.09 — AB (ref 3.87–5.11)

## 2020-06-08 LAB — COMPREHENSIVE METABOLIC PANEL
Albumin: 3.7 (ref 3.5–5.0)
Calcium: 9.3 (ref 8.7–10.7)

## 2020-06-08 LAB — CBC AND DIFFERENTIAL
HCT: 30 — AB (ref 36–46)
Hemoglobin: 9.8 — AB (ref 12.0–16.0)
Neutrophils Absolute: 2.26
Platelets: 131 — AB (ref 150–399)
WBC: 4.1

## 2020-07-01 NOTE — Progress Notes (Signed)
[facility]  [address] [place],  [place]  [address] [phone]  Clinic Day:  07/02/2020  Referring physician: Nicoletta Dress, MD   This document serves as a record of services personally performed by Hosie Poisson, MD. It was created on their behalf by [facility], a trained medical scribe. The creation of this record is based on the scribe's personal observations and the provider's statements to them.   CHIEF COMPLAINT:  CC: Iron deficiency anemia  Current Treatment:  Iron supplement  HISTORY OF PRESENT ILLNESS:  [name] is a 69 y.o. female who we began seeing in December 2019 for iron deficiency anemia.  She was found to have colon polyps with pathology showing tubular adenomas in November 2018.  She also had upper endoscopy in January 2018 with no specific findings.  She was clearly iron deficient.  She has multiple comorbidities, including diabetes, liver cirrhosis, COPD, hyperlipidemia, and degenerative disc disease.  Testing for any other deficiency or monoclonal spike was negative.  She had already been on oral supplement for nearly 1 year with lack of response, so was given IV iron in the form of Feraheme.  She felt somewhat better after the IV iron, but still complained of fatigue and weakness.  She was once again given IV Feraheme in June.  Her hemoglobin improved, but was still low at 11.0.  She underwent colonoscopy and EGD with biopsy in August 2020 with Dr. Noberto Retort, which was negative.  On September 1st her hemoglobin had dropped from 9.8 to 9.0 through Dr. Delena Bali.  When she was seen here on September 24th, the hemoglobin had dropped down to 7.5, so she was transfused with 2 units of packed red blood cells.  Repeat iron studies once again revealed iron deficiency.  She was given IV Feraheme again.  CT of abdomen and pelvis was negative.  She felt so much better after the transfusion that she jumped out of bed and  fractured her right foot in a fall.  She had a drop in her hemoglobin again in November and  was given IV Feraheme once more.  Capsule endoscopy in November 2020 in [facility] revealed multiple AVM's of the small bowel.    Her hemoglobin with Dr. Delena Bali was 9.6 on March 8th 2021, but decreased down to 7.6 on March 31st.  She therefore received IV Feraheme again in early April. She had cauterization of an AVM of the small bowel at [facility] in February 2021.  Abdominal ultrasound from May 20th showed cirrhosis of the liver with nodular surface, increased echogenicity and heterogeneity.  No focal liver parenchymal lesion was observed.  She has had intermittent mild thrombocytopenia, presumably from her liver cirrhosis. She received IV iron in early June, but only received one dose due to her trip to [place], she also received 2 units of packed red blood cells at that time. She presented to the [facility] emergency department on June 13th due to generalized weakness that had been ongoing for 1 week, as well as altered mental status, and was admitted.  She had just returned from a trip to [place].  While out there, she developed severe epistaxis that required packing and 1 unit of packed red blood cells.  CT imaging revealed small amount of blood in the right maxillary and sphenoid sinuses with tube placed in the right paranasal sinus.  She was found to have a UTI.  Her hemoglobin improved from 9.0 in early June to  9.7.   She developed a severe epistaxis for the 3rd time this year, requiring hospital admission in late July and had a nasal bullet in place.  Her hemoglobin dropped down to 6.9 and she has been transfused, and given a dose of IV Feraheme while in the hospital.  Her hemoglobin was still only 8.2 on July 25th, but slowly came up after that.  She had an episode of hypotension and fever and was felt to be septic and transferred to the ICU.  She was placed on broad-spectrum IV antibiotics, and cultures  remained negative.  An echocardiogram from July 27th showed a suspicious mass in the right atrium, which may be attached to the interatrial septum.  The cardiologist reviewed the echocardiogram and felt that this was likely an artifact and not a mass, vegetation or thrombus.  The patient continued to complain of pain from the compression device in her nose and described some green drainage from her sinuses, potentially the source of her fever and sepsis.  Her hemoglobin had come up to 8.7 by the time of discharge on July 27th.  She was seen on August 6th for follow up and reported weakness of her hands and and shakiness of her arms and legs.  She was occasionally dropping objects.  She also reported fatigue, dyspnea with exertion and moderate abdominal swelling.  She had a recent follow-up at Dr. Julien Nordmann office with a good cardiac report.  Due to the asterixis, an ammonia level was obtained, and was elevated at 62.  She was therefore placed on lactulose 30 mL twice a day. She was seen a week later with some improvement, but her serum ammonia was still 54, so the lactulose was increased to TID. She also had hyperkalemia, so her spironolactone 25 mg was held. We now have resumed one pill daily along with Lasix 40 mg daily due to recurrent ascites.  She was transfused with 2 units of PRBCs again at the end of September when her hemoglobin dropped down to 7.3.  Iron level is 13.0 with a TIBC of 415 for a saturation of 3.1%. She was scheduled for 2 more doses of IV Feraheme.  She was admitted on October 20th with a fever of 105 and had E coli sepsis, resistant to Ampicillin only.  I placed her lactulose on hold due to severe diarrhea, adding to her dehydration. Her serum ammonia came down to 9 we did not resume lactulose at that time.  She did have a new splenic infarction as of this admission, which was causing a lot of left abdominal pain. She was discharged on October 25th on Levaquin 750 mg daily #5.      INTERVAL HISTORY:  She is here for routine follow up and states that she has been well, but does note increased abdominal swelling.  She continues Lasix 40 mg daily, spironolactone 25 mg twice daily.  I advised that she increase her spironolactone to 50 mg twice daily, and I will send in a new prescription.  She notes fatigue with exertion, but has been keeping up with her daily activities.  She continues antibiotics, Levaquin, for probable bronchitis and UTI, as prescribed by Dr. Delena Bali.  She states that her urinary symptoms have resolved, but she still has cough and congestion.  I advised that she finish her antibiotic and try Mucinex.  If her symptoms persist, we she may need additional antibiotics.  She denies recurrent epistaxis. Her hemoglobin is stable at 9.7, her platelet count has mildly  decreased from 131,000 to 123,000, and her white count is normal.  Chemistries are unremarkable including a serum ammonia of 11.0.  Her  appetite is fair, and she has gained 2 and 1/2 pounds since her last visit.  She denies fever, chills or other signs of infection.  She denies nausea, vomiting, bowel issues, or abdominal pain.  She denies sore throat, cough, dyspnea, or chest pain.   REVIEW OF SYSTEMS:  Review of Systems  Constitutional: Positive for fatigue.  HENT:  Negative.   Eyes: Negative.   Respiratory: Positive for cough (currently on antibiotics).   Cardiovascular: Negative.   Gastrointestinal: Negative.   Endocrine: Negative.   Genitourinary: Negative.    Musculoskeletal: Negative.   Skin: Negative.   Neurological: Negative.   Hematological: Negative.   Psychiatric/Behavioral: Negative.      VITALS:  Blood pressure (!) 175/72, pulse 83, temperature 97.8 F (36.6 C), temperature source Oral, resp. rate 18, height 5' 3.5" (1.613 m), weight 171 lb 4.8 oz (77.7 kg), SpO2 99 %.  Wt Readings from Last 3 Encounters:  07/02/20 171 lb 4.8 oz (77.7 kg)  06/08/20 168 lb 11.2 oz (76.5 kg)   06/08/20 168 lb 11.2 oz (76.5 kg)    Body mass index is 29.87 kg/m.  Performance status (ECOG): 1 - Symptomatic but completely ambulatory  PHYSICAL EXAM:  Physical Exam Constitutional:      General: She is not in acute distress.    Appearance: Normal appearance. She is normal weight.  HENT:     Head: Normocephalic and atraumatic.  Eyes:     General: No scleral icterus.    Extraocular Movements: Extraocular movements intact.     Conjunctiva/sclera: Conjunctivae normal.     Pupils: Pupils are equal, round, and reactive to light.  Cardiovascular:     Rate and Rhythm: Normal rate and regular rhythm.     Pulses: Normal pulses.     Heart sounds: Normal heart sounds. No murmur heard. No friction rub. No gallop.   Pulmonary:     Effort: Pulmonary effort is normal. No respiratory distress.     Breath sounds: Wheezing (expiratory) present.  Abdominal:     General: Bowel sounds are normal. There is no distension.     Palpations: Abdomen is soft. There is hepatomegaly (mild, with prominent left lobe which is tender) and splenomegaly. There is no mass.     Tenderness: There is no abdominal tenderness.     Comments: Moderate ascites  Musculoskeletal:        General: Normal range of motion.     Cervical back: Normal range of motion and neck supple.     Right lower leg: Edema (trace) present.     Left lower leg: Edema (trace) present.  Lymphadenopathy:     Cervical: No cervical adenopathy.  Skin:    General: Skin is warm and dry.  Neurological:     General: No focal deficit present.     Mental Status: She is alert and oriented to person, place, and time. Mental status is at baseline.  Psychiatric:        Mood and Affect: Mood normal.        Behavior: Behavior normal.        Thought Content: Thought content normal.        Judgment: Judgment normal.     LABS:    CBC Latest Ref Rng & Units 06/08/2020 04/27/2020 04/17/2011  WBC - 4.1 7.1 9.5  Hemoglobin 12.0 - 16.0 9.8(A) 10.0(A)  11.8(L)  Hematocrit 36 - 46 30(A) 30(A) 36.7  Platelets 150 - 399 131(A) 170 269   CMP Latest Ref Rng & Units 06/08/2020 04/18/2011 04/17/2011  Glucose 70 - 99 mg/dL - 143(H) 132(H)  BUN 4 - 21 9 8 7   Creatinine 0.5 - 1.1 0.7 <0.47(L) <0.47(L)  Sodium 137 - 147 143 141 142  Potassium 3.4 - 5.3 3.7 3.7 3.3(L)  Chloride 99 - 108 109(A) 102 99  CO2 13 - 22 25(A) 31 36(H)  Calcium 8.7 - 10.7 9.3 9.4 9.3  Total Protein 6.0 - 8.3 g/dL - - -  Total Bilirubin 0.3 - 1.2 mg/dL - - -  Alkaline Phos 25 - 125 72 - -  AST 13 - 35 40(A) - -  ALT 7 - 35 19 - -    Lab Results  Component Value Date   TIBC 318 05/25/2020   FERRITIN 205 05/25/2020   IRONPCTSAT 25 05/25/2020    STUDIES:  No results found.   Allergies: No Known Allergies  Current Medications: Current Outpatient Medications  Medication Sig Dispense Refill  . colesevelam (WELCHOL) 625 MG tablet Take 1,875 mg by mouth 2 (two) times daily.    . furosemide (LASIX) 40 MG tablet Take 1 tablet by mouth daily.    . insulin aspart (NOVOLOG) 100 UNIT/ML FlexPen Inject 30 Units into the skin 2 (two) times daily.    Marland Kitchen lactulose (CHRONULAC) 10 GM/15ML solution Take 20 g by mouth 1 day or 1 dose.  5  . LEVEMIR FLEXTOUCH 100 UNIT/ML Pen Inject 40 Units into the skin 2 (two) times daily.  12  . levofloxacin (LEVAQUIN) 750 MG tablet Take 750 mg by mouth daily.    Marland Kitchen levothyroxine (SYNTHROID) 50 MCG tablet Take 50 mcg by mouth daily.    Marland Kitchen liraglutide (VICTOZA) 18 MG/3ML SOPN Inject 1.8 mg into the skin daily.     Marland Kitchen lisinopril (PRINIVIL,ZESTRIL) 40 MG tablet Take 1 tablet by mouth daily. (Patient not taking: Reported on 05/25/2020)  3  . metFORMIN (GLUCOPHAGE-XR) 500 MG 24 hr tablet Take 500 mg by mouth 2 (two) times daily with a meal.  5  . montelukast (SINGULAIR) 10 MG tablet Take 10 mg by mouth at bedtime. (Patient not taking: Reported on 05/25/2020)    . nystatin (MYCOSTATIN/NYSTOP) powder  (Patient not taking: Reported on 05/25/2020)    .  omeprazole (PRILOSEC) 20 MG capsule Take 1 capsule by mouth daily. (Patient not taking: Reported on 05/25/2020)  3  . pravastatin (PRAVACHOL) 20 MG tablet Take 1 tablet by mouth once a week. (Patient not taking: Reported on 05/25/2020)    . pregabalin (LYRICA) 75 MG capsule Take 75 mg by mouth at bedtime.  1  . rOPINIRole (REQUIP) 2 MG tablet Take 1 tablet by mouth at bedtime.  3  . spironolactone (ALDACTONE) 25 MG tablet Take 25 mg by mouth 2 (two) times daily.    . valACYclovir (VALTREX) 1000 MG tablet Take 1,000 mg by mouth 2 (two) times daily.    . Vitamin D, Ergocalciferol, (DRISDOL) 1.25 MG (50000 UT) CAPS capsule Take 1 capsule by mouth once a week.     No current facility-administered medications for this visit.     ASSESSMENT & PLAN:   Assessment:  1. Iron deficiency, secondary to chronic GI blood loss from AVM's and recurrent epistaxis.    2. Non-alcoholic liver cirrhosis, with intermittent mild thrombocytopenia.  Her anemia is also likely partly related to the cirrhosis.  3. Ascites, for  which she is on spironolactone and furosemide.  We will increase the spironolactone to 50 mg twice daily.  4. Hepatic encephalopathy.  She has normalization of the ammonia.  She is not using the lactulose regularly but her serum ammonia remains normal.  5. Recurrent epistaxis.  She has been seen by Dr. Gaylyn Cheers who packed the area and noted an AVM there.      6. Multiple AVM's of the small bowel seen on capsule endoscopy in [place], treated at [facility] in February.  7.  Splenic infarct.  Her pain is improving now.   Plan: She knows to continue Lasix 40 mg daily, but as she has increased abdominal swelling, I will increase the spironolactone to 50 mg twice daily, and will send in a new prescription.  We will see her back in 4 weeks with CBC, CMP and serum ammonia for evaluation.  She understands and agrees with this plan of care.  She knows to contact us with any concerns.      I provided 20 minutes of face-to-face time during this this encounter and > 50% was spent counseling as documented under my assessment and plan.    Derwood Kaplan, MD [facility] [place]   I, Rita Ohara, am acting as scribe for Derwood Kaplan, MD  I have reviewed this report as typed by the medical scribe, and it is complete and accurate.

## 2020-07-02 ENCOUNTER — Other Ambulatory Visit: Payer: Self-pay | Admitting: Hematology and Oncology

## 2020-07-02 ENCOUNTER — Telehealth: Payer: Self-pay | Admitting: Oncology

## 2020-07-02 ENCOUNTER — Other Ambulatory Visit: Payer: Self-pay | Admitting: Oncology

## 2020-07-02 ENCOUNTER — Encounter: Payer: Self-pay | Admitting: Oncology

## 2020-07-02 ENCOUNTER — Inpatient Hospital Stay: Payer: Medicare Other

## 2020-07-02 ENCOUNTER — Other Ambulatory Visit: Payer: Self-pay

## 2020-07-02 ENCOUNTER — Inpatient Hospital Stay: Payer: Medicare Other | Attending: Oncology | Admitting: Oncology

## 2020-07-02 VITALS — BP 175/72 | HR 83 | Temp 97.8°F | Resp 18 | Ht 63.5 in | Wt 171.3 lb

## 2020-07-02 DIAGNOSIS — D649 Anemia, unspecified: Secondary | ICD-10-CM | POA: Diagnosis not present

## 2020-07-02 DIAGNOSIS — R188 Other ascites: Secondary | ICD-10-CM

## 2020-07-02 DIAGNOSIS — D500 Iron deficiency anemia secondary to blood loss (chronic): Secondary | ICD-10-CM

## 2020-07-02 DIAGNOSIS — Z0001 Encounter for general adult medical examination with abnormal findings: Secondary | ICD-10-CM | POA: Diagnosis not present

## 2020-07-02 LAB — CBC AND DIFFERENTIAL
HCT: 29 — AB (ref 36–46)
Hemoglobin: 9.7 — AB (ref 12.0–16.0)
Neutrophils Absolute: 2.6
Platelets: 123 — AB (ref 150–399)
WBC: 4.4

## 2020-07-02 LAB — BASIC METABOLIC PANEL
BUN: 12 (ref 4–21)
CO2: 24 — AB (ref 13–22)
Chloride: 105 (ref 99–108)
Creatinine: 0.8 (ref 0.5–1.1)
Glucose: 134
Potassium: 3.5 (ref 3.4–5.3)
Sodium: 140 (ref 137–147)

## 2020-07-02 LAB — CBC
MCV: 98 (ref 81–99)
RBC: 2.97 — AB (ref 3.87–5.11)

## 2020-07-02 LAB — HEPATIC FUNCTION PANEL
ALT: 18 (ref 7–35)
AST: 36 — AB (ref 13–35)
Alkaline Phosphatase: 72 (ref 25–125)
Bilirubin, Total: 0.5

## 2020-07-02 LAB — AMMONIA: Ammonia: 11 (ref 9–30)

## 2020-07-02 LAB — COMPREHENSIVE METABOLIC PANEL
Albumin: 4.2 (ref 3.5–5.0)
Calcium: 9.6 (ref 8.7–10.7)

## 2020-07-02 MED ORDER — SPIRONOLACTONE 50 MG PO TABS: 50.0000 mg | ORAL_TABLET | Freq: Two times a day (BID) | ORAL | 5 refills | Status: DC

## 2020-07-02 NOTE — Telephone Encounter (Signed)
Per 12/9 los next appt given to patient

## 2020-07-03 ENCOUNTER — Telehealth: Payer: Self-pay

## 2020-07-03 NOTE — Telephone Encounter (Addendum)
Pt notified.   ----- Message from Derwood Kaplan, MD sent at 07/02/2020  6:17 PM EST ----- Regarding: RE: COVID booster Yes, I would recommend it ----- Message ----- From: Dairl Ponder, RN Sent: 07/02/2020   3:46 PM EST To: Derwood Kaplan, MD Subject: COVID booster                                  Pt states she forgot to ask you if it was ok for her to go ahead & get her COVID booster shot? [phone]

## 2020-07-08 DIAGNOSIS — E876 Hypokalemia: Secondary | ICD-10-CM | POA: Diagnosis not present

## 2020-07-08 DIAGNOSIS — R809 Proteinuria, unspecified: Secondary | ICD-10-CM | POA: Diagnosis not present

## 2020-07-08 DIAGNOSIS — B9689 Other specified bacterial agents as the cause of diseases classified elsewhere: Secondary | ICD-10-CM | POA: Diagnosis not present

## 2020-07-08 DIAGNOSIS — Z794 Long term (current) use of insulin: Secondary | ICD-10-CM | POA: Diagnosis not present

## 2020-07-08 DIAGNOSIS — E1165 Type 2 diabetes mellitus with hyperglycemia: Secondary | ICD-10-CM | POA: Diagnosis not present

## 2020-07-08 DIAGNOSIS — J019 Acute sinusitis, unspecified: Secondary | ICD-10-CM | POA: Diagnosis not present

## 2020-07-08 DIAGNOSIS — E1129 Type 2 diabetes mellitus with other diabetic kidney complication: Secondary | ICD-10-CM | POA: Diagnosis not present

## 2020-07-08 DIAGNOSIS — N390 Urinary tract infection, site not specified: Secondary | ICD-10-CM | POA: Diagnosis not present

## 2020-07-27 DIAGNOSIS — K7460 Unspecified cirrhosis of liver: Secondary | ICD-10-CM | POA: Diagnosis not present

## 2020-07-27 DIAGNOSIS — K766 Portal hypertension: Secondary | ICD-10-CM | POA: Diagnosis not present

## 2020-07-27 DIAGNOSIS — K760 Fatty (change of) liver, not elsewhere classified: Secondary | ICD-10-CM | POA: Diagnosis not present

## 2020-07-29 ENCOUNTER — Other Ambulatory Visit: Payer: Self-pay | Admitting: Oncology

## 2020-07-29 DIAGNOSIS — K7290 Hepatic failure, unspecified without coma: Secondary | ICD-10-CM

## 2020-07-29 DIAGNOSIS — K7682 Hepatic encephalopathy: Secondary | ICD-10-CM

## 2020-07-29 NOTE — Progress Notes (Signed)
[facility]  [address] [place],  [place]  [address] [phone]  Clinic Day:  07/30/2020  Referring physician: Nicoletta Dress, MD   This document serves as a record of services personally performed by Hosie Poisson, MD. It was created on their behalf by [facility], a trained medical scribe. The creation of this record is based on the scribe's personal observations and the provider's statements to them.   CHIEF COMPLAINT:  CC: Iron deficiency anemia  Current Treatment:  Iron supplement  HISTORY OF PRESENT ILLNESS:  [name] is a 70 y.o. female who we began seeing in December 2019 for iron deficiency anemia.  She was found to have colon polyps with pathology showing tubular adenomas in November 2018.  She also had upper endoscopy in January 2018 with no specific findings.  She was clearly iron deficient.  She has multiple comorbidities, including diabetes, liver cirrhosis, COPD, hyperlipidemia, and degenerative disc disease.  Testing for any other deficiency or monoclonal spike was negative.  She had already been on oral supplement for nearly 1 year with lack of response, so was given IV iron in the form of Feraheme.  She felt somewhat better after the IV iron, but still complained of fatigue and weakness.  She was once again given IV Feraheme in June.  Her hemoglobin improved, but was still low at 11.0.  She underwent colonoscopy and EGD with biopsy in August 2020 with Dr. Noberto Retort, which was negative.  On September 1st her hemoglobin had dropped from 9.8 to 9.0 through Dr. Delena Bali.  When she was seen here on September 24th, the hemoglobin had dropped down to 7.5, so she was transfused with 2 units of packed red blood cells.  Repeat iron studies once again revealed iron deficiency.  She was given IV Feraheme again.  CT of abdomen and pelvis was negative.  She felt so much better after the transfusion that she jumped out of bed and  fractured her right foot in a fall.  She had a drop in her hemoglobin again in November and  was given IV Feraheme once more.  Capsule endoscopy in November 2020 in [facility] revealed multiple AVM's of the small bowel.    Her hemoglobin with Dr. Delena Bali was 9.6 on March 8th 2021, but decreased down to 7.6 on March 31st.  She therefore received IV Feraheme again in early April. She had cauterization of an AVM of the small bowel at [facility] in February 2021.  Abdominal ultrasound from May 20th showed cirrhosis of the liver with nodular surface, increased echogenicity and heterogeneity.  No focal liver parenchymal lesion was observed.  She has had intermittent mild thrombocytopenia, presumably from her liver cirrhosis. She received IV iron in early June, but only received one dose due to her trip to [place], she also received 2 units of packed red blood cells at that time. She presented to the [facility] emergency department on June 13th due to generalized weakness that had been ongoing for 1 week, as well as altered mental status, and was admitted.  She had just returned from a trip to [place].  While out there, she developed severe epistaxis that required packing and 1 unit of packed red blood cells.  CT imaging revealed small amount of blood in the right maxillary and sphenoid sinuses with tube placed in the right paranasal sinus.  She was found to have a UTI.  Her hemoglobin improved from 9.0 in early June to  9.7.   She developed a severe epistaxis for the 3rd time this year, requiring hospital admission in late July and had a nasal bullet in place.  Her hemoglobin dropped down to 6.9 and she has been transfused, and given a dose of IV Feraheme while in the hospital.  Her hemoglobin was still only 8.2 on July 25th, but slowly came up after that.  She had an episode of hypotension and fever and was felt to be septic and transferred to the ICU.  She was placed on broad-spectrum IV antibiotics, and cultures  remained negative.  An echocardiogram from July 27th showed a suspicious mass in the right atrium, which may be attached to the interatrial septum.  The cardiologist reviewed the echocardiogram and felt that this was likely an artifact and not a mass, vegetation or thrombus.  The patient continued to complain of pain from the compression device in her nose and described some green drainage from her sinuses, potentially the source of her fever and sepsis.  Her hemoglobin had come up to 8.7 by the time of discharge on July 27th.  She was seen on August 6th for follow up and reported weakness of her hands and and shakiness of her arms and legs.  She was occasionally dropping objects.  She also reported fatigue, dyspnea with exertion and moderate abdominal swelling.  She had a recent follow-up at Dr. Kem Parkinson office with a good cardiac report.  Due to the asterixis, an ammonia level was obtained, and was elevated at 62.  She was therefore placed on lactulose 30 mL twice a day. She was seen a week later with some improvement, but her serum ammonia was still 54, so the lactulose was increased to TID. She also had hyperkalemia, so her spironolactone 25 mg was held. We now have resumed one pill daily along with Lasix 40 mg daily due to recurrent ascites.  She was transfused with 2 units of PRBCs again at the end of September when her hemoglobin dropped down to 7.3.  Iron level is 13.0 with a TIBC of 415 for a saturation of 3.1%. She was scheduled for 2 more doses of IV Feraheme.  She was admitted on October 20th with a fever of 105 and had E coli sepsis, resistant to Ampicillin only.  I placed her lactulose on hold due to severe diarrhea, adding to her dehydration. Her serum ammonia came down to 9 we did not resume lactulose at that time.  She did have a new splenic infarction as of this admission, which was causing a lot of left abdominal pain. Her spironolactone was increased to 50 mg to take 1-2 daily.    INTERVAL  HISTORY:  She is here for routine follow up and states that she has been well other than fatigue.  She did have an episode of right upper quadrant pain last night, but this has resolved.  She had an abdominal ultrasound recently, which revealed stable liver sclerosis and portal hypertension.  She has been taking the spironolactone intermittently.  She has not been taking lactulose as her bowel movements have been good.  She reports sinus congestion with green mucus.  She was given an antibiotic before Christmas, but this is persistent.  I will place her on Augmentin 875 mg daily for 10 days.  Her hemoglobin has mildly decreased from 9.7 to 9.5, and her white count and platelets are normal.  Chemistries are unremarkable including a serum ammonia of 17.0.  Non-fasting blood glucose is 197.  Potassium is borderline normal at 3.5.  Her  appetite is good, and she has lost 3 pounds since her last visit.  She denies fever, chills or other signs of infection.  She denies nausea, vomiting, bowel issues, or abdominal pain.  She denies sore throat, cough, dyspnea, or chest pain.  REVIEW OF SYSTEMS:  Review of Systems  Constitutional: Positive for appetite change (intermittent appetite) and fatigue.  HENT:         Congestion with green mucus  Eyes: Negative.   Respiratory: Positive for wheezing.   Cardiovascular: Negative.   Gastrointestinal: Negative.   Endocrine: Negative.   Genitourinary: Negative.    Musculoskeletal: Negative.   Skin: Negative.   Neurological: Negative.   Hematological: Negative.   Psychiatric/Behavioral: Negative.      VITALS:  Blood pressure (!) 148/66, pulse 99, temperature 98.1 F (36.7 C), resp. rate 18, weight 168 lb (76.2 kg), SpO2 98 %.  Wt Readings from Last 3 Encounters:  07/30/20 168 lb (76.2 kg)  07/02/20 171 lb 4.8 oz (77.7 kg)  06/08/20 168 lb 11.2 oz (76.5 kg)    Body mass index is 29.29 kg/m.  Performance status (ECOG): 1 - Symptomatic but completely  ambulatory  PHYSICAL EXAM:  Physical Exam Constitutional:      General: She is not in acute distress.    Appearance: Normal appearance. She is normal weight.  HENT:     Head: Normocephalic and atraumatic.  Eyes:     General: No scleral icterus.    Extraocular Movements: Extraocular movements intact.     Conjunctiva/sclera: Conjunctivae normal.     Pupils: Pupils are equal, round, and reactive to light.  Cardiovascular:     Rate and Rhythm: Normal rate and regular rhythm.     Pulses: Normal pulses.     Heart sounds: Normal heart sounds. No murmur heard. No friction rub. No gallop.   Pulmonary:     Effort: Pulmonary effort is normal. No respiratory distress.     Breath sounds: Normal breath sounds.  Abdominal:     General: Bowel sounds are normal. There is no distension.     Palpations: Abdomen is soft. There is hepatomegaly (about 14 cm below the right costal margin) and splenomegaly (about 14 below the left costal margin). There is no mass.     Tenderness: There is no abdominal tenderness.  Musculoskeletal:        General: Normal range of motion.     Cervical back: Normal range of motion and neck supple.     Right lower leg: No edema.     Left lower leg: No edema.  Lymphadenopathy:     Cervical: No cervical adenopathy.  Skin:    General: Skin is warm and dry.  Neurological:     General: No focal deficit present.     Mental Status: She is alert and oriented to person, place, and time. Mental status is at baseline.  Psychiatric:        Mood and Affect: Mood normal.        Behavior: Behavior normal.        Thought Content: Thought content normal.        Judgment: Judgment normal.     LABS:    CBC Latest Ref Rng & Units 07/02/2020 06/08/2020 04/27/2020  WBC - 4.4 4.1 7.1  Hemoglobin 12.0 - 16.0 9.7(A) 9.8(A) 10.0(A)  Hematocrit 36 - 46 29(A) 30(A) 30(A)  Platelets 150 - 399 123(A) 131(A) 170   CMP Latest Ref  Rng & Units 07/02/2020 06/08/2020 04/18/2011  Glucose 70 - 99  mg/dL - - 143(H)  BUN 4 - 21 12 9 8   Creatinine 0.5 - 1.1 0.8 0.7 <0.47(L)  Sodium 137 - 147 140 143 141  Potassium 3.4 - 5.3 3.5 3.7 3.7  Chloride 99 - 108 105 109(A) 102  CO2 13 - 22 24(A) 25(A) 31  Calcium 8.7 - 10.7 9.6 9.3 9.4  Total Protein 6.0 - 8.3 g/dL - - -  Total Bilirubin 0.3 - 1.2 mg/dL - - -  Alkaline Phos 25 - 125 72 72 -  AST 13 - 35 36(A) 40(A) -  ALT 7 - 35 18 19 -    Lab Results  Component Value Date   TIBC 318 05/25/2020   FERRITIN 205 05/25/2020   IRONPCTSAT 25 05/25/2020    STUDIES:  No results found.   Allergies: No Known Allergies  Current Medications: Current Outpatient Medications  Medication Sig Dispense Refill  . colesevelam (WELCHOL) 625 MG tablet Take 1,875 mg by mouth 2 (two) times daily.    . furosemide (LASIX) 40 MG tablet Take 1 tablet by mouth daily.    . insulin aspart (NOVOLOG) 100 UNIT/ML FlexPen Inject 30 Units into the skin 2 (two) times daily.    Marland Kitchen lactulose (CHRONULAC) 10 GM/15ML solution Take 20 g by mouth 1 day or 1 dose.  5  . LEVEMIR FLEXTOUCH 100 UNIT/ML Pen Inject 40 Units into the skin 2 (two) times daily.  12  . levofloxacin (LEVAQUIN) 750 MG tablet Take 750 mg by mouth daily.    Marland Kitchen levothyroxine (SYNTHROID) 50 MCG tablet Take 50 mcg by mouth daily.    Marland Kitchen liraglutide (VICTOZA) 18 MG/3ML SOPN Inject 1.8 mg into the skin daily.     Marland Kitchen lisinopril (PRINIVIL,ZESTRIL) 40 MG tablet Take 1 tablet by mouth daily. (Patient not taking: Reported on 05/25/2020)  3  . metFORMIN (GLUCOPHAGE-XR) 500 MG 24 hr tablet Take 500 mg by mouth 2 (two) times daily with a meal.  5  . montelukast (SINGULAIR) 10 MG tablet Take 10 mg by mouth at bedtime. (Patient not taking: Reported on 05/25/2020)    . nystatin (MYCOSTATIN/NYSTOP) powder  (Patient not taking: Reported on 05/25/2020)    . omeprazole (PRILOSEC) 20 MG capsule Take 1 capsule by mouth daily. (Patient not taking: Reported on 05/25/2020)  3  . oxyCODONE-acetaminophen (PERCOCET/ROXICET) 5-325 MG  tablet Take 1 tablet by mouth every 4 (four) hours.    . pravastatin (PRAVACHOL) 20 MG tablet Take 1 tablet by mouth once a week. (Patient not taking: Reported on 05/25/2020)    . pregabalin (LYRICA) 75 MG capsule Take 75 mg by mouth at bedtime.  1  . rOPINIRole (REQUIP) 2 MG tablet Take 1 tablet by mouth at bedtime.  3  . spironolactone (ALDACTONE) 50 MG tablet Take 1 tablet (50 mg total) by mouth 2 (two) times daily. 60 tablet 5  . valACYclovir (VALTREX) 1000 MG tablet Take 1,000 mg by mouth 2 (two) times daily.    . Vitamin D, Ergocalciferol, (DRISDOL) 1.25 MG (50000 UT) CAPS capsule Take 1 capsule by mouth once a week.     No current facility-administered medications for this visit.    ASSESSMENT & PLAN:   Assessment:  1. Iron deficiency, secondary to chronic GI blood loss from AVM's and recurrent epistaxis.  She last received IV iron in September [id number].  2. Non-alcoholic liver cirrhosis, with intermittent mild thrombocytopenia.  Her anemia is also likely partly related  to the cirrhosis.  3. Ascites, for which she is on spironolactone and furosemide.  She can take the spironolactone to 50 mg 1-2 daily.  4. Hepatic encephalopathy.  She has normalization of the ammonia.  She is not using the lactulose regularly but her serum ammonia remains normal.  5. Recurrent epistaxis, currently stable.  She has been seen by Dr. Gaylyn Cheers who packed the area and noted an AVM there.      6. Multiple AVM's of the small bowel seen on capsule endoscopy in [place], treated at [facility] in February.  7.  Splenic infarct.  Her pain is improving now.   8. Acute sinusitis.  I will place her on a course of Augmentin.  Plan: With her current symptoms of congestion with green mucus, I will place her on Augmentin 875 mg daily for 10 days.  She knows to continue Lasix 40 mg daily, and spironolactone 50 mg 1-2 daily.  We will see her back in 3-4 weeks with CBC, CMP, iron studies and serum ammonia for  evaluation.  She understands and agrees with this plan of care.  She knows to contact us with any concerns.     I provided 30 minutes of face-to-face time during this this encounter and > 50% was spent counseling as documented under my assessment and plan.    Derwood Kaplan, MD [facility] [place]   I, Rita Ohara, am acting as scribe for Derwood Kaplan, MD  I have reviewed this report as typed by the medical scribe, and it is complete and accurate.

## 2020-07-30 ENCOUNTER — Encounter: Payer: Self-pay | Admitting: Oncology

## 2020-07-30 ENCOUNTER — Telehealth: Payer: Self-pay | Admitting: Oncology

## 2020-07-30 ENCOUNTER — Inpatient Hospital Stay: Payer: Medicare Other | Attending: Oncology | Admitting: Oncology

## 2020-07-30 ENCOUNTER — Other Ambulatory Visit: Payer: Self-pay | Admitting: Oncology

## 2020-07-30 ENCOUNTER — Other Ambulatory Visit: Payer: Self-pay | Admitting: Hematology and Oncology

## 2020-07-30 ENCOUNTER — Other Ambulatory Visit: Payer: Self-pay

## 2020-07-30 ENCOUNTER — Inpatient Hospital Stay: Payer: Medicare Other

## 2020-07-30 VITALS — BP 148/66 | HR 99 | Temp 98.1°F | Resp 18 | Ht 63.5 in | Wt 168.0 lb

## 2020-07-30 DIAGNOSIS — J329 Chronic sinusitis, unspecified: Secondary | ICD-10-CM

## 2020-07-30 DIAGNOSIS — K7460 Unspecified cirrhosis of liver: Secondary | ICD-10-CM | POA: Diagnosis not present

## 2020-07-30 DIAGNOSIS — D500 Iron deficiency anemia secondary to blood loss (chronic): Secondary | ICD-10-CM

## 2020-07-30 DIAGNOSIS — K7581 Nonalcoholic steatohepatitis (NASH): Secondary | ICD-10-CM | POA: Diagnosis not present

## 2020-07-30 DIAGNOSIS — K7031 Alcoholic cirrhosis of liver with ascites: Secondary | ICD-10-CM

## 2020-07-30 DIAGNOSIS — D649 Anemia, unspecified: Secondary | ICD-10-CM | POA: Diagnosis not present

## 2020-07-30 DIAGNOSIS — Z0001 Encounter for general adult medical examination with abnormal findings: Secondary | ICD-10-CM | POA: Diagnosis not present

## 2020-07-30 DIAGNOSIS — B9689 Other specified bacterial agents as the cause of diseases classified elsewhere: Secondary | ICD-10-CM

## 2020-07-30 LAB — CBC AND DIFFERENTIAL
HCT: 28 — AB (ref 36–46)
Hemoglobin: 9.2 — AB (ref 12.0–16.0)
Neutrophils Absolute: 3.89
Platelets: 150 (ref 150–399)
WBC: 5.8

## 2020-07-30 LAB — CBC: RBC: 2.89 — AB (ref 3.87–5.11)

## 2020-07-30 LAB — BASIC METABOLIC PANEL
BUN: 14 (ref 4–21)
CO2: 24 — AB (ref 13–22)
Chloride: 106 (ref 99–108)
Creatinine: 0.9 (ref 0.5–1.1)
Glucose: 197
Potassium: 3.5 (ref 3.4–5.3)
Sodium: 140 (ref 137–147)

## 2020-07-30 LAB — HEPATIC FUNCTION PANEL
ALT: 21 (ref 7–35)
AST: 33 (ref 13–35)
Alkaline Phosphatase: 62 (ref 25–125)
Bilirubin, Total: 0.8

## 2020-07-30 LAB — COMPREHENSIVE METABOLIC PANEL
Albumin: 4 (ref 3.5–5.0)
Calcium: 9.9 (ref 8.7–10.7)

## 2020-07-30 MED ORDER — AMOXICILLIN-POT CLAVULANATE 875-125 MG PO TABS: 1.0000 | ORAL_TABLET | Freq: Two times a day (BID) | ORAL | 0 refills | Status: AC

## 2020-07-30 NOTE — Telephone Encounter (Signed)
Per 1/6 LOS, patient scheduled for Feb 2022 Appt's.  Gave patient Appt Summary

## 2020-08-03 DIAGNOSIS — H2513 Age-related nuclear cataract, bilateral: Secondary | ICD-10-CM | POA: Diagnosis not present

## 2020-08-25 ENCOUNTER — Telehealth: Payer: Self-pay | Admitting: *Deleted

## 2020-08-25 NOTE — Telephone Encounter (Signed)
Per Vida Roller, please reschedule patient to see Melissa.  Left Msg w/patient

## 2020-08-25 NOTE — Progress Notes (Signed)
[facility]  [address] [place],  [place]  [address] [phone]  Clinic Day:  08/26/2020  Referring physician: Nicoletta Dress, MD      CHIEF COMPLAINT:  CC: Iron deficiency anemia  Current Treatment:  Iron supplement  HISTORY OF PRESENT ILLNESS:  [name] is a 70 y.o. female who we began seeing in December 2019 for iron deficiency anemia.  She was found to have colon polyps with pathology showing tubular adenomas in November 2018.  She also had upper endoscopy in January 2018 with no specific findings.  She was clearly iron deficient.  She has multiple comorbidities, including diabetes, liver cirrhosis, COPD, hyperlipidemia, and degenerative disc disease.  Testing for any other deficiency or monoclonal spike was negative.  She had already been on oral supplement for nearly 1 year with lack of response, so was given IV iron in the form of Feraheme.  She felt somewhat better after the IV iron, but still complained of fatigue and weakness.  She was once again given IV Feraheme in June.  Her hemoglobin improved, but was still low at 11.0.  She underwent colonoscopy and EGD with biopsy in August 2020 with Dr. Noberto Retort, which was negative.  On September 1st her hemoglobin had dropped from 9.8 to 9.0 through Dr. Delena Bali.  When she was seen here on September 24th, the hemoglobin had dropped down to 7.5, so she was transfused with 2 units of packed red blood cells.  Repeat iron studies once again revealed iron deficiency.  She was given IV Feraheme again.  CT of abdomen and pelvis was negative.  She felt so much better after the transfusion that she jumped out of bed and fractured her right foot in a fall.  She had a drop in her hemoglobin again in November and  was given IV Feraheme once more.  Capsule endoscopy in November 2020 in [facility] revealed multiple AVM's of the small bowel.    Her hemoglobin with Dr. Delena Bali was 9.6 on March 8th 2021, but  decreased down to 7.6 on March 31st.  She therefore received IV Feraheme again in early April. She had cauterization of an AVM of the small bowel at [facility] in February 2021.  Abdominal ultrasound from May 20th showed cirrhosis of the liver with nodular surface, increased echogenicity and heterogeneity.  No focal liver parenchymal lesion was observed.  She has had intermittent mild thrombocytopenia, presumably from her liver cirrhosis. She received IV iron in early June, but only received one dose due to her trip to [place], she also received 2 units of packed red blood cells at that time. She presented to the [facility] emergency department on June 13th due to generalized weakness that had been ongoing for 1 week, as well as altered mental status, and was admitted.  She had just returned from a trip to [place].  While out there, she developed severe epistaxis that required packing and 1 unit of packed red blood cells.  CT imaging revealed small amount of blood in the right maxillary and sphenoid sinuses with tube placed in the right paranasal sinus.  She was found to have a UTI.  Her hemoglobin improved from 9.0 in early June to 9.7.   She developed a severe epistaxis for the 3rd time this year, requiring hospital admission in late July and had a nasal bullet in place.  Her hemoglobin dropped down to 6.9 and she has been transfused, and given a dose of  IV Feraheme while in the hospital.  Her hemoglobin was still only 8.2 on July 25th, but slowly came up after that.  She had an episode of hypotension and fever and was felt to be septic and transferred to the ICU.  She was placed on broad-spectrum IV antibiotics, and cultures remained negative.  An echocardiogram from July 27th showed a suspicious mass in the right atrium, which may be attached to the interatrial septum.  The cardiologist reviewed the echocardiogram and felt that this was likely an artifact and not a mass, vegetation or thrombus.  The patient  continued to complain of pain from the compression device in her nose and described some green drainage from her sinuses, potentially the source of her fever and sepsis.  Her hemoglobin had come up to 8.7 by the time of discharge on July 27th.  She was seen on August 6th for follow up and reported weakness of her hands and and shakiness of her arms and legs.  She was occasionally dropping objects.  She also reported fatigue, dyspnea with exertion and moderate abdominal swelling.  She had a recent follow-up at Dr. Julien Nordmann office with a good cardiac report.  Due to the asterixis, an ammonia level was obtained, and was elevated at 62.  She was therefore placed on lactulose 30 mL twice a day. She was seen a week later with some improvement, but her serum ammonia was still 54, so the lactulose was increased to TID. She also had hyperkalemia, so her spironolactone 25 mg was held. We now have resumed one pill daily along with Lasix 40 mg daily due to recurrent ascites.  She was transfused with 2 units of PRBCs again at the end of September when her hemoglobin dropped down to 7.3.  Iron level is 13.0 with a TIBC of 415 for a saturation of 3.1%. She was scheduled for 2 more doses of IV Feraheme.  She was admitted on October 20th with a fever of 105 and had E coli sepsis, resistant to Ampicillin only.  I placed her lactulose on hold due to severe diarrhea, adding to her dehydration. Her serum ammonia came down to 9 we did not resume lactulose at that time.  She did have a new splenic infarction as of this admission, which was causing a lot of left abdominal pain. Her spironolactone was increased to 50 mg to take 1-2 daily.    INTERVAL HISTORY:  She is here for routine follow up and states that she has been well other than fatigue. She had an abdominal ultrasound recently, which revealed stable liver sclerosis and portal hypertension.  She has been taking the spironolactone intermittently.  She has not been taking  lactulose as her bowel movements have been good. She denies fever,chills, nausea or vomiting. She denies shortness of breath, chest pain or cough. She denies issue with bowel or bladder. Today her CBC reveals hemoglobin 7.6 with normal ANC and platelets. Iron studies today are low with saturation 5.2, TIBC 476 and ferritin 6.4. Ammonia level today is elevated at 35.  REVIEW OF SYSTEMS:  Review of Systems  Constitutional: Positive for fatigue. Negative for appetite change, chills, diaphoresis, fever and unexpected weight change.  HENT:   Negative for hearing loss, lump/mass, mouth sores, nosebleeds, sore throat, tinnitus, trouble swallowing and voice change.   Eyes: Negative for eye problems and icterus.  Respiratory: Negative for chest tightness, cough, hemoptysis, shortness of breath and wheezing.   Cardiovascular: Negative for chest pain, leg swelling and palpitations.  Gastrointestinal: Negative for abdominal distention, abdominal pain, blood in stool, constipation, diarrhea, nausea, rectal pain and vomiting.  Endocrine: Negative for hot flashes.  Genitourinary: Negative for bladder incontinence, difficulty urinating, dyspareunia, dysuria, frequency, hematuria and nocturia.   Musculoskeletal: Negative for arthralgias, back pain, flank pain, gait problem, myalgias, neck pain and neck stiffness.  Skin: Negative for itching, rash and wound.  Neurological: Negative for dizziness, extremity weakness, gait problem, headaches, light-headedness, numbness, seizures and speech difficulty.  Hematological: Negative for adenopathy. Does not bruise/bleed easily.  Psychiatric/Behavioral: Negative for confusion, decreased concentration, depression, sleep disturbance and suicidal ideas. The patient is not nervous/anxious.      VITALS:  Blood pressure (!) 163/70, pulse 86, temperature 98 F (36.7 C), temperature source Oral, resp. rate 18, height 5' 3.5" (1.613 m), weight 169 lb 9.6 oz (76.9 kg), SpO2 98 %.   Wt Readings from Last 3 Encounters:  08/26/20 169 lb 9.6 oz (76.9 kg)  07/30/20 168 lb (76.2 kg)  07/02/20 171 lb 4.8 oz (77.7 kg)    Body mass index is 29.57 kg/m.  Performance status (ECOG): 1 - Symptomatic but completely ambulatory  PHYSICAL EXAM:  Physical Exam Constitutional:      General: She is not in acute distress.    Appearance: Normal appearance. She is normal weight. She is not ill-appearing, toxic-appearing or diaphoretic.  HENT:     Head: Normocephalic and atraumatic.     Right Ear: Tympanic membrane normal.     Left Ear: Tympanic membrane normal.     Nose: Nose normal. No congestion or rhinorrhea.     Mouth/Throat:     Mouth: Mucous membranes are moist.     Pharynx: Oropharynx is clear. No oropharyngeal exudate or posterior oropharyngeal erythema.  Eyes:     General: No scleral icterus.       Right eye: No discharge.        Left eye: No discharge.     Extraocular Movements: Extraocular movements intact.     Conjunctiva/sclera: Conjunctivae normal.     Pupils: Pupils are equal, round, and reactive to light.  Neck:     Vascular: No carotid bruit.  Cardiovascular:     Rate and Rhythm: Normal rate and regular rhythm.     Heart sounds: No murmur heard. No friction rub. No gallop.   Pulmonary:     Effort: Pulmonary effort is normal. No respiratory distress.     Breath sounds: Normal breath sounds. No stridor. No wheezing, rhonchi or rales.  Chest:     Chest wall: No tenderness.  Abdominal:     General: Abdomen is flat. Bowel sounds are normal. There is no distension.     Palpations: There is no mass.     Tenderness: There is no abdominal tenderness. There is no right CVA tenderness, left CVA tenderness, guarding or rebound.     Hernia: No hernia is present.  Musculoskeletal:        General: No swelling, tenderness, deformity or signs of injury. Normal range of motion.     Cervical back: Normal range of motion and neck supple. No rigidity or tenderness.      Right lower leg: No edema.     Left lower leg: No edema.  Lymphadenopathy:     Cervical: No cervical adenopathy.  Skin:    General: Skin is warm and dry.     Capillary Refill: Capillary refill takes less than 2 seconds.     Coloration: Skin is not jaundiced or pale.  Findings: No bruising, erythema, lesion or rash.  Neurological:     General: No focal deficit present.     Mental Status: She is alert and oriented to person, place, and time. Mental status is at baseline.     Cranial Nerves: No cranial nerve deficit.     Sensory: No sensory deficit.     Motor: No weakness.     Coordination: Coordination normal.     Gait: Gait normal.     Deep Tendon Reflexes: Reflexes normal.  Psychiatric:        Mood and Affect: Mood normal.        Behavior: Behavior normal.        Thought Content: Thought content normal.        Judgment: Judgment normal.     LABS:    CBC Latest Ref Rng & Units 08/26/2020 07/30/2020 07/02/2020  WBC - 4.9 5.8 4.4  Hemoglobin 12.0 - 16.0 7.6(A) 9.2(A) 9.7(A)  Hematocrit 36 - 46 23(A) 28(A) 29(A)  Platelets 150 - 399 148(A) 150 123(A)   CMP Latest Ref Rng & Units 08/26/2020 07/30/2020 07/02/2020  Glucose 70 - 99 mg/dL - - -  BUN 4 - 21 13 14 12   Creatinine 0.5 - 1.1 0.8 0.9 0.8  Sodium 137 - 147 138 140 140  Potassium 3.4 - 5.3 3.5 3.5 3.5  Chloride 99 - 108 107 106 105  CO2 13 - 22 22 24(A) 24(A)  Calcium 8.7 - 10.7 9.9 9.9 9.6  Total Protein 6.0 - 8.3 g/dL - - -  Total Bilirubin 0.3 - 1.2 mg/dL - - -  Alkaline Phos 25 - 125 63 62 72  AST 13 - 35 28 33 36(A)  ALT 7 - 35 18 21 18     Lab Results  Component Value Date   TIBC 476 08/26/2020   TIBC 318 05/25/2020   FERRITIN 6.4 08/26/2020   FERRITIN 205 05/25/2020   IRONPCTSAT 5.2 08/26/2020   IRONPCTSAT 25 05/25/2020    STUDIES:  No results found.   Allergies: No Known Allergies  Current Medications: Current Outpatient Medications  Medication Sig Dispense Refill  . colesevelam (WELCHOL) 625 MG  tablet Take 1,875 mg by mouth 2 (two) times daily.    . furosemide (LASIX) 40 MG tablet Take 1 tablet by mouth daily.    . insulin aspart (NOVOLOG) 100 UNIT/ML FlexPen Inject 30 Units into the skin 2 (two) times daily.    Marland Kitchen lactulose (CHRONULAC) 10 GM/15ML solution Take 20 g by mouth 1 day or 1 dose.  5  . LEVEMIR FLEXTOUCH 100 UNIT/ML Pen Inject 40 Units into the skin 2 (two) times daily.  12  . levofloxacin (LEVAQUIN) 750 MG tablet Take 750 mg by mouth daily.    Marland Kitchen levothyroxine (SYNTHROID) 50 MCG tablet Take 50 mcg by mouth daily.    Marland Kitchen liraglutide (VICTOZA) 18 MG/3ML SOPN Inject 1.8 mg into the skin daily.     Marland Kitchen lisinopril (PRINIVIL,ZESTRIL) 40 MG tablet Take 1 tablet by mouth daily. (Patient not taking: Reported on 05/25/2020)  3  . metFORMIN (GLUCOPHAGE-XR) 500 MG 24 hr tablet Take 500 mg by mouth 2 (two) times daily with a meal.  5  . montelukast (SINGULAIR) 10 MG tablet Take 10 mg by mouth at bedtime. (Patient not taking: Reported on 05/25/2020)    . nystatin (MYCOSTATIN/NYSTOP) powder  (Patient not taking: Reported on 05/25/2020)    . omeprazole (PRILOSEC) 20 MG capsule Take 1 capsule by mouth daily. (Patient not taking:  Reported on 05/25/2020)  3  . oxyCODONE-acetaminophen (PERCOCET/ROXICET) 5-325 MG tablet Take 1 tablet by mouth every 4 (four) hours.    . pravastatin (PRAVACHOL) 20 MG tablet Take 1 tablet by mouth once a week. (Patient not taking: Reported on 05/25/2020)    . pregabalin (LYRICA) 75 MG capsule Take 75 mg by mouth at bedtime.  1  . rOPINIRole (REQUIP) 2 MG tablet Take 1 tablet by mouth at bedtime.  3  . spironolactone (ALDACTONE) 50 MG tablet Take 1 tablet (50 mg total) by mouth 2 (two) times daily. 60 tablet 5  . valACYclovir (VALTREX) 1000 MG tablet Take 1,000 mg by mouth 2 (two) times daily.    . Vitamin D, Ergocalciferol, (DRISDOL) 1.25 MG (50000 UT) CAPS capsule Take 1 capsule by mouth once a week.     No current facility-administered medications for this visit.     ASSESSMENT & PLAN:   Assessment:  1. Iron deficiency, secondary to chronic GI blood loss from AVM's and recurrent epistaxis.  She last received IV iron in September 2021. Hemoglobin is 7.6 today. We will transfuse 2 units PRBC. We will plan for IV iron to begin next week.  2. Non-alcoholic liver cirrhosis, with intermittent mild thrombocytopenia.  Her anemia is also likely partly related to the cirrhosis.  3. Ascites, for which she is on spironolactone and furosemide.  She can take the spironolactone to 50 mg 1-2 daily.  4. Hepatic encephalopathy.  She has normalization of the ammonia.  She is not using the lactulose regularly but her serum ammonia remains normal. Ammonia is elevated today; she will start back on lactulose daily  5. Recurrent epistaxis, currently stable.  She has been seen by Dr. Gaylyn Cheers who packed the area and noted an AVM there.      6. Multiple AVM's of the small bowel seen on capsule endoscopy in [place], treated at [facility] in February.  7.  Splenic infarct.  Her pain is improving now.   8. Acute sinusitis.  I will place her on a course of Augmentin. This has resolved  Plan: As she is symptomatic with her anemia today, we will transfuse 2 units of PRBC tomorrow. We will plan to authorize IV iron and hopefully start that next week as she is unable to tolerate oral iron. She will begin back with her lactulose daily as her ammonia is elevated today. She verbalizes understanding of and agreement to the plans discussed today.   I provided 30 minutes of face-to-face time during this this encounter and > 50% was spent counseling as documented under my assessment and plan.    Melodye Ped, NP [facility] [place]

## 2020-08-26 ENCOUNTER — Inpatient Hospital Stay: Payer: Medicare Other | Attending: Oncology

## 2020-08-26 ENCOUNTER — Inpatient Hospital Stay (INDEPENDENT_AMBULATORY_CARE_PROVIDER_SITE_OTHER): Payer: Medicare Other | Admitting: Hematology and Oncology

## 2020-08-26 ENCOUNTER — Other Ambulatory Visit: Payer: Self-pay | Admitting: Hematology and Oncology

## 2020-08-26 ENCOUNTER — Encounter: Payer: Self-pay | Admitting: Hematology and Oncology

## 2020-08-26 ENCOUNTER — Other Ambulatory Visit: Payer: Self-pay

## 2020-08-26 ENCOUNTER — Telehealth: Payer: Self-pay | Admitting: Hematology and Oncology

## 2020-08-26 VITALS — BP 163/70 | HR 86 | Temp 98.0°F | Resp 18 | Ht 63.5 in | Wt 169.6 lb

## 2020-08-26 DIAGNOSIS — K7030 Alcoholic cirrhosis of liver without ascites: Secondary | ICD-10-CM | POA: Diagnosis not present

## 2020-08-26 DIAGNOSIS — J449 Chronic obstructive pulmonary disease, unspecified: Secondary | ICD-10-CM | POA: Insufficient documentation

## 2020-08-26 DIAGNOSIS — R040 Epistaxis: Secondary | ICD-10-CM | POA: Diagnosis not present

## 2020-08-26 DIAGNOSIS — Z79899 Other long term (current) drug therapy: Secondary | ICD-10-CM | POA: Diagnosis not present

## 2020-08-26 DIAGNOSIS — E119 Type 2 diabetes mellitus without complications: Secondary | ICD-10-CM | POA: Insufficient documentation

## 2020-08-26 DIAGNOSIS — D500 Iron deficiency anemia secondary to blood loss (chronic): Secondary | ICD-10-CM

## 2020-08-26 DIAGNOSIS — D735 Infarction of spleen: Secondary | ICD-10-CM | POA: Insufficient documentation

## 2020-08-26 DIAGNOSIS — Q2733 Arteriovenous malformation of digestive system vessel: Secondary | ICD-10-CM | POA: Insufficient documentation

## 2020-08-26 DIAGNOSIS — K7290 Hepatic failure, unspecified without coma: Secondary | ICD-10-CM | POA: Diagnosis not present

## 2020-08-26 DIAGNOSIS — Z794 Long term (current) use of insulin: Secondary | ICD-10-CM | POA: Insufficient documentation

## 2020-08-26 DIAGNOSIS — R188 Other ascites: Secondary | ICD-10-CM | POA: Diagnosis not present

## 2020-08-26 DIAGNOSIS — E860 Dehydration: Secondary | ICD-10-CM | POA: Insufficient documentation

## 2020-08-26 DIAGNOSIS — Z7984 Long term (current) use of oral hypoglycemic drugs: Secondary | ICD-10-CM | POA: Insufficient documentation

## 2020-08-26 DIAGNOSIS — R531 Weakness: Secondary | ICD-10-CM | POA: Insufficient documentation

## 2020-08-26 DIAGNOSIS — E785 Hyperlipidemia, unspecified: Secondary | ICD-10-CM | POA: Insufficient documentation

## 2020-08-26 DIAGNOSIS — R5383 Other fatigue: Secondary | ICD-10-CM | POA: Insufficient documentation

## 2020-08-26 DIAGNOSIS — D649 Anemia, unspecified: Secondary | ICD-10-CM | POA: Diagnosis not present

## 2020-08-26 LAB — CBC AND DIFFERENTIAL
HCT: 23 — AB (ref 36–46)
Hemoglobin: 7.6 — AB (ref 12.0–16.0)
Neutrophils Absolute: 2.94
Platelets: 148 — AB (ref 150–399)
WBC: 4.9

## 2020-08-26 LAB — BASIC METABOLIC PANEL
BUN: 13 (ref 4–21)
CO2: 22 (ref 13–22)
Chloride: 107 (ref 99–108)
Creatinine: 0.8 (ref 0.5–1.1)
Glucose: 155
Potassium: 3.5 (ref 3.4–5.3)
Sodium: 138 (ref 137–147)

## 2020-08-26 LAB — IRON,TIBC AND FERRITIN PANEL
%SAT: 5.2
Ferritin: 6.4
Iron: 25
TIBC: 476

## 2020-08-26 LAB — HEPATIC FUNCTION PANEL
ALT: 18 (ref 7–35)
AST: 28 (ref 13–35)
Alkaline Phosphatase: 63 (ref 25–125)
Bilirubin, Total: 0.5

## 2020-08-26 LAB — PREPARE RBC (CROSSMATCH)

## 2020-08-26 LAB — AMMONIA: Ammonia: 35

## 2020-08-26 LAB — COMPREHENSIVE METABOLIC PANEL
Albumin: 4.2 (ref 3.5–5.0)
Calcium: 9.9 (ref 8.7–10.7)

## 2020-08-26 LAB — CBC: RBC: 2.55 — AB (ref 3.87–5.11)

## 2020-08-26 NOTE — Telephone Encounter (Signed)
Per 2/2 Staff Msg, patient scheduled to receive 2 Units RBC on 2/3 9:30 am

## 2020-08-27 ENCOUNTER — Inpatient Hospital Stay: Payer: Medicare Other

## 2020-08-27 DIAGNOSIS — E785 Hyperlipidemia, unspecified: Secondary | ICD-10-CM | POA: Insufficient documentation

## 2020-08-27 DIAGNOSIS — Q2733 Arteriovenous malformation of digestive system vessel: Secondary | ICD-10-CM | POA: Diagnosis not present

## 2020-08-27 DIAGNOSIS — K7290 Hepatic failure, unspecified without coma: Secondary | ICD-10-CM | POA: Diagnosis not present

## 2020-08-27 DIAGNOSIS — I10 Essential (primary) hypertension: Secondary | ICD-10-CM | POA: Insufficient documentation

## 2020-08-27 DIAGNOSIS — E119 Type 2 diabetes mellitus without complications: Secondary | ICD-10-CM | POA: Insufficient documentation

## 2020-08-27 DIAGNOSIS — K7030 Alcoholic cirrhosis of liver without ascites: Secondary | ICD-10-CM | POA: Diagnosis not present

## 2020-08-27 DIAGNOSIS — D509 Iron deficiency anemia, unspecified: Secondary | ICD-10-CM | POA: Insufficient documentation

## 2020-08-27 DIAGNOSIS — E860 Dehydration: Secondary | ICD-10-CM | POA: Diagnosis not present

## 2020-08-27 DIAGNOSIS — R040 Epistaxis: Secondary | ICD-10-CM | POA: Diagnosis not present

## 2020-08-27 DIAGNOSIS — D500 Iron deficiency anemia secondary to blood loss (chronic): Secondary | ICD-10-CM | POA: Diagnosis not present

## 2020-08-27 MED ORDER — DIPHENHYDRAMINE HCL 25 MG PO CAPS
25.0000 mg | ORAL_CAPSULE | Freq: Once | ORAL | Status: AC
  Administered 2020-08-27: 25 mg via ORAL
  Filled 2020-08-27: qty 1

## 2020-08-27 MED ORDER — ACETAMINOPHEN 325 MG PO TABS
ORAL_TABLET | ORAL | Status: AC
  Filled 2020-08-27: qty 2

## 2020-08-27 MED ORDER — SODIUM CHLORIDE 0.9% IV SOLUTION
250.0000 mL | Freq: Once | INTRAVENOUS | Status: AC
  Administered 2020-08-27: 250 mL via INTRAVENOUS
  Filled 2020-08-27: qty 250

## 2020-08-27 MED ORDER — SODIUM CHLORIDE 0.9% FLUSH
10.0000 mL | INTRAVENOUS | Status: DC | PRN
  Filled 2020-08-27: qty 10

## 2020-08-27 MED ORDER — ACETAMINOPHEN 325 MG PO TABS
650.0000 mg | ORAL_TABLET | Freq: Once | ORAL | Status: AC
  Administered 2020-08-27: 650 mg via ORAL
  Filled 2020-08-27: qty 2

## 2020-08-27 MED ORDER — SODIUM CHLORIDE 0.9% FLUSH
3.0000 mL | INTRAVENOUS | Status: DC | PRN
  Filled 2020-08-27: qty 10

## 2020-08-27 MED ORDER — DIPHENHYDRAMINE HCL 25 MG PO CAPS
ORAL_CAPSULE | ORAL | Status: AC
  Filled 2020-08-27: qty 1

## 2020-08-27 MED ORDER — HEPARIN SOD (PORK) LOCK FLUSH 100 UNIT/ML IV SOLN
500.0000 [IU] | Freq: Every day | INTRAVENOUS | Status: DC | PRN
  Filled 2020-08-27: qty 5

## 2020-08-27 NOTE — Patient Instructions (Signed)

## 2020-08-28 LAB — TYPE AND SCREEN
ABO/RH(D): A POS
Antibody Screen: NEGATIVE
Unit division: 0
Unit division: 0

## 2020-08-28 LAB — BPAM RBC
Blood Product Expiration Date: 202202212359
Blood Product Expiration Date: 202202212359
ISSUE DATE / TIME: 202202030727
ISSUE DATE / TIME: 202202030727
Unit Type and Rh: 6200
Unit Type and Rh: 6200

## 2020-08-31 ENCOUNTER — Other Ambulatory Visit: Payer: Self-pay

## 2020-08-31 ENCOUNTER — Encounter: Payer: Self-pay | Admitting: Cardiology

## 2020-08-31 ENCOUNTER — Other Ambulatory Visit: Payer: Self-pay | Admitting: Oncology

## 2020-08-31 ENCOUNTER — Encounter: Payer: Self-pay | Admitting: Oncology

## 2020-08-31 ENCOUNTER — Ambulatory Visit (INDEPENDENT_AMBULATORY_CARE_PROVIDER_SITE_OTHER): Payer: Medicare Other | Admitting: Cardiology

## 2020-08-31 VITALS — BP 140/66 | HR 82 | Ht 63.0 in | Wt 166.0 lb

## 2020-08-31 DIAGNOSIS — E088 Diabetes mellitus due to underlying condition with unspecified complications: Secondary | ICD-10-CM | POA: Diagnosis not present

## 2020-08-31 DIAGNOSIS — D509 Iron deficiency anemia, unspecified: Secondary | ICD-10-CM | POA: Diagnosis not present

## 2020-08-31 DIAGNOSIS — I350 Nonrheumatic aortic (valve) stenosis: Secondary | ICD-10-CM | POA: Diagnosis not present

## 2020-08-31 DIAGNOSIS — E782 Mixed hyperlipidemia: Secondary | ICD-10-CM | POA: Diagnosis not present

## 2020-08-31 DIAGNOSIS — D500 Iron deficiency anemia secondary to blood loss (chronic): Secondary | ICD-10-CM

## 2020-08-31 DIAGNOSIS — D649 Anemia, unspecified: Secondary | ICD-10-CM | POA: Diagnosis not present

## 2020-08-31 DIAGNOSIS — I10 Essential (primary) hypertension: Secondary | ICD-10-CM | POA: Diagnosis not present

## 2020-08-31 NOTE — Patient Instructions (Signed)

## 2020-08-31 NOTE — Progress Notes (Signed)
Cardiology Office Note:    Date:  08/31/2020   ID:  [name], DOB 1951-06-16, MRN [id number]  PCP:  Nicoletta Dress, MD  Cardiologist:  Berniece Salines, DO  Electrophysiologist:  None   Referring MD: Nicoletta Dress, MD     History of Present Illness:    [name] is a 70 y.o. female with a hx of hypertension, diabetes on insulin, dyslipidemia, mild aortic stenosis, chronic iron deficiency presents today for follow-up visit.  I saw the patient in August 2021, at that time she did have a hemoglobin of 10 and was following with oncology.  From a cardiovascular standpoint she appears to be doing well.  We will continue her medications.  She is here today for a follow-up visit.  She tells me that she has been doing well from a cardiovascular standpoint.  However recently she needed a blood transfusion given the fact that her hemoglobin is 7.6.  She still follows with her hematologist.    Denies chest pain, shortness of breath, palpitations, lightheadednes/dizziness, lower extremity edema, orthopnea or PND.   Past Medical History:  Diagnosis Date  . Anemia 07/02/2018  . Aortic stenosis, mild 09/03/2018  . Cirrhosis of liver without ascites ([place]) 07/03/2019  . Diabetes mellitus due to underlying condition with unspecified complications ([place]) [id number]  . Diabetes mellitus without complication ([place])   . Essential hypertension 07/02/2018  . Hematochezia 06/20/2016  . History of colonic polyps 02/18/2019  . Hyperlipidemia   . Hypertension   . Iron deficiency anemia 06/20/2016  . Iron deficiency anemia, unspecified   . RUQ pain 06/20/2016  . Tightness in chest 09/03/2018    Past Surgical History:  Procedure Laterality Date  . ABDOMINAL HYSTERECTOMY    . BACK SURGERY      Current Medications: Current Meds  Medication Sig  . colesevelam (WELCHOL) 625 MG tablet Take 1,875 mg by mouth 2 (two) times daily.  . furosemide (LASIX) 40 MG tablet Take 1 tablet by  mouth daily.  . insulin aspart (NOVOLOG) 100 UNIT/ML FlexPen Inject 30 Units into the skin 2 (two) times daily.  Marland Kitchen lactulose (CHRONULAC) 10 GM/15ML solution Take 20 g by mouth 1 day or 1 dose.  Marland Kitchen LEVEMIR FLEXTOUCH 100 UNIT/ML Pen Inject 40 Units into the skin 2 (two) times daily.  Marland Kitchen levofloxacin (LEVAQUIN) 750 MG tablet Take 750 mg by mouth daily.  Marland Kitchen levothyroxine (SYNTHROID) 50 MCG tablet Take 50 mcg by mouth daily.  Marland Kitchen liraglutide (VICTOZA) 18 MG/3ML SOPN Inject 1.8 mg into the skin daily.   Marland Kitchen lisinopril (PRINIVIL,ZESTRIL) 40 MG tablet Take 1 tablet by mouth daily.  . metFORMIN (GLUCOPHAGE-XR) 500 MG 24 hr tablet Take 500 mg by mouth 2 (two) times daily with a meal.  . montelukast (SINGULAIR) 10 MG tablet Take 10 mg by mouth at bedtime.  Marland Kitchen nystatin (MYCOSTATIN/NYSTOP) powder   . omeprazole (PRILOSEC) 20 MG capsule Take 1 capsule by mouth daily.  Marland Kitchen oxyCODONE-acetaminophen (PERCOCET/ROXICET) 5-325 MG tablet Take 1 tablet by mouth every 4 (four) hours.  . pravastatin (PRAVACHOL) 20 MG tablet Take 1 tablet by mouth once a week.  . pregabalin (LYRICA) 75 MG capsule Take 75 mg by mouth at bedtime.  Marland Kitchen rOPINIRole (REQUIP) 2 MG tablet Take 1 tablet by mouth at bedtime.  Marland Kitchen spironolactone (ALDACTONE) 50 MG tablet Take 1 tablet (50 mg total) by mouth 2 (two) times daily.  . valACYclovir (VALTREX) 1000 MG tablet Take 1,000 mg by mouth 2 (two) times  daily.  . Vitamin D, Ergocalciferol, (DRISDOL) 1.25 MG (50000 UT) CAPS capsule Take 1 capsule by mouth once a week.     Allergies:   Patient has no known allergies.   Social History   Socioeconomic History  . Marital status: Divorced    Spouse name: Not on file  . Number of children: Not on file  . Years of education: Not on file  . Highest education level: Not on file  Occupational History  . Not on file  Tobacco Use  . Smoking status: Former Research scientist (life sciences)  . Smokeless tobacco: Never Used  . Tobacco comment: Social in 71 and 6  Vaping Use  . Vaping  Use: Never used  Substance and Sexual Activity  . Alcohol use: Never  . Drug use: Never  . Sexual activity: Not on file  Other Topics Concern  . Not on file  Social History Narrative  . Not on file   Social Determinants of Health   Financial Resource Strain: Not on file  Food Insecurity: Not on file  Transportation Needs: Not on file  Physical Activity: Not on file  Stress: Not on file  Social Connections: Not on file     Family History: The patient's family history includes Alzheimer's disease in her mother; Cancer in her father; Heart disease in her mother; Kidney cancer in her paternal uncle; Lymphoma in her father; Parkinson's disease in her paternal grandmother.  ROS:   Review of Systems  Constitution: Negative for decreased appetite, fever and weight gain.  HENT: Negative for congestion, ear discharge, hoarse voice and sore throat.   Eyes: Negative for discharge, redness, vision loss in right eye and visual halos.  Cardiovascular: Negative for chest pain, dyspnea on exertion, leg swelling, orthopnea and palpitations.  Respiratory: Negative for cough, hemoptysis, shortness of breath and snoring.   Endocrine: Negative for heat intolerance and polyphagia.  Hematologic/Lymphatic: Negative for bleeding problem. Does not bruise/bleed easily.  Skin: Negative for flushing, nail changes, rash and suspicious lesions.  Musculoskeletal: Negative for arthritis, joint pain, muscle cramps, myalgias, neck pain and stiffness.  Gastrointestinal: Negative for abdominal pain, bowel incontinence, diarrhea and excessive appetite.  Genitourinary: Negative for decreased libido, genital sores and incomplete emptying.  Neurological: Negative for brief paralysis, focal weakness, headaches and loss of balance.  Psychiatric/Behavioral: Negative for altered mental status, depression and suicidal ideas.  Allergic/Immunologic: Negative for HIV exposure and persistent infections.    EKGs/Labs/Other  Studies Reviewed:    The following studies were reviewed today:   EKG: None today  Echocardiogram done on February 15, 2020 showed EF 60 to 65%.  RV was mildly enlarged, left atrium was mildly dilated.  There is a questionable echogenic mass in the RA which after further assessment was actually an artifact.  Mild aortic valve sclerosis.  Trace to mild mitral vegetation.  Mild tricuspid regurgitation was present.  Mild pulmonary hypertension was present.  Recent Labs: 08/26/2020: ALT 18; BUN 13; Creatinine 0.8; Hemoglobin 7.6; Platelets 148; Potassium 3.5; Sodium 138  Recent Lipid Panel No results found for: CHOL, TRIG, HDL, CHOLHDL, VLDL, LDLCALC, LDLDIRECT  Physical Exam:    VS:  BP 140/66   Pulse 82   Ht 5\' 3"  (1.6 m)   Wt 166 lb (75.3 kg)   BMI 29.41 kg/m     Wt Readings from Last 3 Encounters:  08/31/20 166 lb (75.3 kg)  08/27/20 167 lb 12 oz (76.1 kg)  08/26/20 169 lb 9.6 oz (76.9 kg)     GEN:  Well nourished, well developed in no acute distress HEENT: Normal NECK: No JVD; No carotid bruits LYMPHATICS: No lymphadenopathy CARDIAC: S1S2 noted,RRR, no murmurs, rubs, gallops RESPIRATORY:  Clear to auscultation without rales, wheezing or rhonchi  ABDOMEN: Soft, non-tender, non-distended, +bowel sounds, no guarding. EXTREMITIES: No edema, No cyanosis, no clubbing MUSCULOSKELETAL:  No deformity  SKIN: Warm and dry NEUROLOGIC:  Alert and oriented x 3, non-focal PSYCHIATRIC:  Normal affect, good insight  ASSESSMENT:    1. Essential hypertension   2. Aortic stenosis, mild   3. Mixed hyperlipidemia   4. Diabetes mellitus due to underlying condition with unspecified complications ([place])    PLAN:     1.  She appears to be doing well from a cardiovascular standpoint.  No medication changes at this time. 2.  Blood pressure is acceptable, continue with current antihypertensive regimen. 3.  Hyperlipidemia - continue with current statin medication. 4.  This is being managed by his  primary care doctor.  No adjustments for antidiabetic medications were made today.  The patient is in agreement with the above plan. The patient left the office in stable condition.  The patient will follow up in 6 months or sooner if needed.   Medication Adjustments/Labs and Tests Ordered: Current medicines are reviewed at length with the patient today.  Concerns regarding medicines are outlined above.  No orders of the defined types were placed in this encounter.  No orders of the defined types were placed in this encounter.   Patient Instructions  Medication Instructions:  Your physician recommends that you continue on your current medications as directed. Please refer to the Current Medication list given to you today.  *If you need a refill on your cardiac medications before your next appointment, please call your pharmacy*   Lab Work: NONE If you have labs (blood work) drawn today and your tests are completely normal, you will receive your results only by: Marland Kitchen MyChart Message (if you have MyChart) OR . A paper copy in the mail If you have any lab test that is abnormal or we need to change your treatment, we will call you to review the results.   Testing/Procedures: NONE   Follow-Up: At [facility], you and your health needs are our priority.  As part of our continuing mission to provide you with exceptional heart care, we have created designated Provider Care Teams.  These Care Teams include your primary Cardiologist (physician) and Advanced Practice Providers (APPs -  Physician Assistants and Nurse Practitioners) who all work together to provide you with the care you need, when you need it.  We recommend signing up for the patient portal called "MyChart".  Sign up information is provided on this After Visit Summary.  MyChart is used to connect with patients for Virtual Visits (Telemedicine).  Patients are able to view lab/test results, encounter notes, upcoming appointments,  etc.  Non-urgent messages can be sent to your provider as well.   To learn more about what you can do with MyChart, go to [email or web address].    Your next appointment:   6 month(s)  The format for your next appointment:   In Person  Provider:   Berniece Salines, DO   Other Instructions      Adopting a Healthy Lifestyle.  Know what a healthy weight is for you (roughly BMI <25) and aim to maintain this   Aim for 7+ servings of fruits and vegetables daily   65-80+ fluid ounces of water or unsweet tea for healthy kidneys  Limit to max 1 drink of alcohol per day; avoid smoking/tobacco   Limit animal fats in diet for cholesterol and heart health - choose grass fed whenever available   Avoid highly processed foods, and foods high in saturated/trans fats   Aim for low stress - take time to unwind and care for your mental health   Aim for 150 min of moderate intensity exercise weekly for heart health, and weights twice weekly for bone health   Aim for 7-9 hours of sleep daily   When it comes to diets, agreement about the perfect plan isnt easy to find, even among the experts. Experts at the [employer] developed an idea known as the Healthy Eating Plate. Just imagine a plate divided into logical, healthy portions.   The emphasis is on diet quality:   Load up on vegetables and fruits - one-half of your plate: Aim for color and variety, and remember that potatoes dont count.   Go for whole grains - one-quarter of your plate: Whole wheat, barley, wheat berries, quinoa, oats, brown rice, and foods made with them. If you want pasta, go with whole wheat pasta.   Protein power - one-quarter of your plate: Fish, chicken, beans, and nuts are all healthy, versatile protein sources. Limit red meat.   The diet, however, does go beyond the plate, offering a few other suggestions.   Use healthy plant oils, such as olive, canola, soy, corn, sunflower and peanut.  Check the labels, and avoid partially hydrogenated oil, which have unhealthy trans fats.   If youre thirsty, drink water. Coffee and tea are good in moderation, but skip sugary drinks and limit milk and dairy products to one or two daily servings.   The type of carbohydrate in the diet is more important than the amount. Some sources of carbohydrates, such as vegetables, fruits, whole grains, and beans-are healthier than others.   Finally, stay active  Signed, Berniece Salines, DO  08/31/2020 8:54 AM    [place]

## 2020-09-01 ENCOUNTER — Other Ambulatory Visit: Payer: Self-pay | Admitting: Hematology and Oncology

## 2020-09-01 DIAGNOSIS — R809 Proteinuria, unspecified: Secondary | ICD-10-CM | POA: Diagnosis not present

## 2020-09-01 DIAGNOSIS — E1165 Type 2 diabetes mellitus with hyperglycemia: Secondary | ICD-10-CM | POA: Diagnosis not present

## 2020-09-01 DIAGNOSIS — Z23 Encounter for immunization: Secondary | ICD-10-CM | POA: Diagnosis not present

## 2020-09-01 DIAGNOSIS — D509 Iron deficiency anemia, unspecified: Secondary | ICD-10-CM | POA: Diagnosis not present

## 2020-09-01 DIAGNOSIS — E039 Hypothyroidism, unspecified: Secondary | ICD-10-CM | POA: Diagnosis not present

## 2020-09-01 DIAGNOSIS — E1129 Type 2 diabetes mellitus with other diabetic kidney complication: Secondary | ICD-10-CM | POA: Diagnosis not present

## 2020-09-01 DIAGNOSIS — E785 Hyperlipidemia, unspecified: Secondary | ICD-10-CM | POA: Diagnosis not present

## 2020-09-01 DIAGNOSIS — E559 Vitamin D deficiency, unspecified: Secondary | ICD-10-CM | POA: Diagnosis not present

## 2020-09-01 DIAGNOSIS — Z6828 Body mass index (BMI) 28.0-28.9, adult: Secondary | ICD-10-CM | POA: Diagnosis not present

## 2020-09-01 DIAGNOSIS — Z794 Long term (current) use of insulin: Secondary | ICD-10-CM | POA: Diagnosis not present

## 2020-09-01 DIAGNOSIS — M810 Age-related osteoporosis without current pathological fracture: Secondary | ICD-10-CM | POA: Diagnosis not present

## 2020-09-04 NOTE — Addendum Note (Signed)
Addended by: Juanetta Beets on: 09/04/2020 12:05 PM   Modules accepted: Orders

## 2020-09-07 ENCOUNTER — Inpatient Hospital Stay (INDEPENDENT_AMBULATORY_CARE_PROVIDER_SITE_OTHER): Payer: Medicare Other | Admitting: Oncology

## 2020-09-07 ENCOUNTER — Other Ambulatory Visit: Payer: Self-pay | Admitting: Hematology and Oncology

## 2020-09-07 ENCOUNTER — Other Ambulatory Visit: Payer: Self-pay | Admitting: Oncology

## 2020-09-07 ENCOUNTER — Telehealth: Payer: Self-pay

## 2020-09-07 ENCOUNTER — Telehealth: Payer: Self-pay | Admitting: Oncology

## 2020-09-07 ENCOUNTER — Inpatient Hospital Stay: Payer: Medicare Other

## 2020-09-07 ENCOUNTER — Encounter: Payer: Self-pay | Admitting: Oncology

## 2020-09-07 VITALS — BP 170/59 | HR 103 | Temp 98.3°F | Resp 18 | Ht 63.0 in | Wt 164.4 lb

## 2020-09-07 DIAGNOSIS — D500 Iron deficiency anemia secondary to blood loss (chronic): Secondary | ICD-10-CM | POA: Diagnosis not present

## 2020-09-07 DIAGNOSIS — D649 Anemia, unspecified: Secondary | ICD-10-CM | POA: Diagnosis not present

## 2020-09-07 DIAGNOSIS — K7290 Hepatic failure, unspecified without coma: Secondary | ICD-10-CM

## 2020-09-07 DIAGNOSIS — D509 Iron deficiency anemia, unspecified: Secondary | ICD-10-CM | POA: Diagnosis not present

## 2020-09-07 DIAGNOSIS — K7682 Hepatic encephalopathy: Secondary | ICD-10-CM

## 2020-09-07 LAB — COMPREHENSIVE METABOLIC PANEL
Albumin: 4.2 (ref 3.5–5.0)
Calcium: 9.4 (ref 8.7–10.7)

## 2020-09-07 LAB — CBC AND DIFFERENTIAL
HCT: 25 — AB (ref 36–46)
Hemoglobin: 7.9 — AB (ref 12.0–16.0)
Neutrophils Absolute: 3.36
Platelets: 155 (ref 150–399)
WBC: 5.5

## 2020-09-07 LAB — HEPATIC FUNCTION PANEL
ALT: 22 (ref 7–35)
AST: 36 — AB (ref 13–35)
Alkaline Phosphatase: 62 (ref 25–125)
Bilirubin, Total: 0.7

## 2020-09-07 LAB — BASIC METABOLIC PANEL
BUN: 21 (ref 4–21)
CO2: 20 (ref 13–22)
Chloride: 107 (ref 99–108)
Creatinine: 1 (ref 0.5–1.1)
Glucose: 156
Potassium: 3.7 (ref 3.4–5.3)
Sodium: 139 (ref 137–147)

## 2020-09-07 LAB — PREPARE RBC (CROSSMATCH)

## 2020-09-07 LAB — CBC: RBC: 2.7 — AB (ref 3.87–5.11)

## 2020-09-07 NOTE — Progress Notes (Signed)
[facility]  [address] [place],  [place]  [address] [phone]  Clinic Day:  09/07/2020  Referring physician: Nicoletta Dress, MD   This document serves as a record of services personally performed by Hosie Poisson, MD. It was created on their behalf by [facility], a trained medical scribe. The creation of this record is based on the scribe's personal observations and the provider's statements to them.  CHIEF COMPLAINT:  CC: Iron deficiency anemia  Current Treatment:  Iron supplement  HISTORY OF PRESENT ILLNESS:  [name] is a 70 y.o. female who we began seeing in December 2019 for iron deficiency anemia.  She was found to have colon polyps with pathology showing tubular adenomas in November 2018.  She also had upper endoscopy in January 2018 with no specific findings.  She was clearly iron deficient.  She has multiple comorbidities, including diabetes, liver cirrhosis, COPD, hyperlipidemia, and degenerative disc disease.  Testing for any other deficiency or monoclonal spike was negative.  She had already been on oral supplement for nearly 1 year with lack of response, so was given IV iron in the form of Feraheme.  She felt somewhat better after the IV iron, but still complained of fatigue and weakness.  She was once again given IV Feraheme in June.  Her hemoglobin improved, but was still low at 11.0.  She underwent colonoscopy and EGD with biopsy in August 2020 with Dr. Noberto Retort, which was negative.  On September 1st her hemoglobin had dropped from 9.8 to 9.0 through Dr. Delena Bali.  When she was seen here on September 24th, the hemoglobin had dropped down to 7.5, so she was transfused with 2 units of packed red blood cells.  Repeat iron studies once again revealed iron deficiency.  She was given IV Feraheme again.  CT of abdomen and pelvis was negative.  She felt so much better after the transfusion that she jumped out of bed and  fractured her right foot in a fall.  She had a drop in her hemoglobin again in November and  was given IV Feraheme once more.  Capsule endoscopy in November 2020 in [facility] revealed multiple AVM's of the small bowel.    Her hemoglobin with Dr. Delena Bali was 9.6 on March 8th 2021, but decreased down to 7.6 on March 31st.  She therefore received IV Feraheme again in early April. She had cauterization of an AVM of the small bowel at [facility] in February 2021.  Abdominal ultrasound from May 20th showed cirrhosis of the liver with nodular surface, increased echogenicity and heterogeneity.  No focal liver parenchymal lesion was observed.  She has had intermittent mild thrombocytopenia, presumably from her liver cirrhosis. She received IV iron in early June, but only received one dose due to her trip to [place], she also received 2 units of packed red blood cells at that time. She presented to the [facility] emergency department on June 13th due to generalized weakness that had been ongoing for 1 week, as well as altered mental status, and was admitted.  She had just returned from a trip to [place].  While out there, she developed severe epistaxis that required packing and 1 unit of packed red blood cells.  CT imaging revealed small amount of blood in the right maxillary and sphenoid sinuses with tube placed in the right paranasal sinus.  She was found to have a UTI.  Her hemoglobin improved from 9.0 in early June to 9.7.  She developed a severe epistaxis for the 3rd time this year, requiring hospital admission in late July and had a nasal bullet in place.  Her hemoglobin dropped down to 6.9 and she has been transfused, and given a dose of IV Feraheme while in the hospital.  Her hemoglobin was still only 8.2 on July 25th, but slowly came up after that.  She had an episode of hypotension and fever and was felt to be septic and transferred to the ICU.  She was placed on broad-spectrum IV antibiotics, and cultures  remained negative.  An echocardiogram from July 27th showed a suspicious mass in the right atrium, which may be attached to the interatrial septum.  The cardiologist reviewed the echocardiogram and felt that this was likely an artifact and not a mass, vegetation or thrombus.  The patient continued to complain of pain from the compression device in her nose and described some green drainage from her sinuses, potentially the source of her fever and sepsis.  Her hemoglobin had come up to 8.7 by the time of discharge on July 27th.  She was seen on August 6th for follow up and reported weakness of her hands and and shakiness of her arms and legs.  She was occasionally dropping objects.  She also reported fatigue, dyspnea with exertion and moderate abdominal swelling.  She had a recent follow-up at Dr. Julien Nordmann office with a good cardiac report.  Due to the asterixis, an ammonia level was obtained, and was elevated at 62.  She was therefore placed on lactulose 30 mL twice a day. She was seen a week later with some improvement, but her serum ammonia was still 54, so the lactulose was increased to TID. She also had hyperkalemia, so her spironolactone 25 mg was held. We now have resumed one pill daily along with Lasix 40 mg daily due to recurrent ascites.  She was transfused with 2 units of PRBCs again at the end of September when her hemoglobin dropped down to 7.3.  Iron level is 13.0 with a TIBC of 415 for a saturation of 3.1%. She was scheduled for 2 more doses of IV Feraheme.  She was admitted on October 20th with a fever of 105 and had E coli sepsis, resistant to Ampicillin only.  I placed her lactulose on hold due to severe diarrhea, adding to her dehydration. Her serum ammonia came down to 9 we did not resume lactulose at that time.  She did have a new splenic infarction as of this admission, which was causing a lot of left abdominal pain. Her spironolactone was increased to 50 mg to take 1-2 daily.   When she was  seen two weeks ago, her CBC reveals a hemoglobin of 7.6 with normal ANC and platelets.  She was therefore transfused 2 units of PRBCs.  Iron studies today are low with saturation 5.2, TIBC 476 and ferritin 6.4. Ammonia level was elevated at 35, and so her lactulose was increased to 30 cc twice daily.  She was scheduled for IV iron, but has not received that yet.    INTERVAL HISTORY:  She added to the schedule today because she called with severe weakness, lethargy and dropping things. Now she states that she feels the best she has in about 4-5 days.  However, she still notes generalized weakness and fatigue.  She continues lactulose twice daily, and the serum ammonia has improved, so I advised that she continue this dose.  She was having black stools for a couple  of days, but these have returned to normal.  She also notes one episode of moderate epistaxis.  She was evaluated by Dr. Delena Bali on February 8th and states that her hemoglobin was over 9.0 at that time.  She is scheduled for IV iron on February 17th and again on February 24th.  Her hemoglobin today is 7.9, previously 7.6, and her white count and platelets are normal.  We will add a type and cross today and plan to transfuse 1 unit of PRBCs tomorrow morning.  Chemistries are unremarkable except for a BUN of 21.  Her  appetite is good, and she has lost 3 and 1/2 pounds since her last visit.  She denies fever, chills or other signs of infection.  She denies nausea, vomiting, bowel issues, or abdominal pain.  She denies sore throat, cough, dyspnea, or chest pain.  REVIEW OF SYSTEMS:  Review of Systems  Constitutional: Positive for fatigue.       Generalized weakness  HENT:  Negative.   Eyes: Negative.   Respiratory: Negative.   Cardiovascular: Negative.   Gastrointestinal: Negative.  Negative for blood in stool.  Endocrine: Negative.   Genitourinary: Negative.    Musculoskeletal: Negative.   Skin: Negative.   Neurological: Negative.    Hematological: Negative.   Psychiatric/Behavioral: Negative.      VITALS:  Blood pressure (!) 170/59, pulse (!) 103, temperature 98.3 F (36.8 C), temperature source Oral, resp. rate 18, height 5\' 3"  (1.6 m), weight 164 lb 6.4 oz (74.6 kg), SpO2 99 %.  Wt Readings from Last 3 Encounters:  09/07/20 164 lb 6.4 oz (74.6 kg)  08/31/20 166 lb (75.3 kg)  08/27/20 167 lb 12 oz (76.1 kg)    Body mass index is 29.12 kg/m.  Performance status (ECOG): 1 - Symptomatic but completely ambulatory  PHYSICAL EXAM:  Physical Exam Constitutional:      General: She is not in acute distress.    Appearance: Normal appearance. She is normal weight.  HENT:     Head: Normocephalic and atraumatic.  Eyes:     General: No scleral icterus.    Extraocular Movements: Extraocular movements intact.     Conjunctiva/sclera: Conjunctivae normal.     Pupils: Pupils are equal, round, and reactive to light.  Cardiovascular:     Rate and Rhythm: Regular rhythm. Tachycardia present.     Pulses: Normal pulses.     Heart sounds: Normal heart sounds. No murmur heard. No friction rub. No gallop.   Pulmonary:     Effort: Pulmonary effort is normal. No respiratory distress.     Breath sounds: Normal breath sounds.  Abdominal:     General: Bowel sounds are normal. There is no distension.     Palpations: Abdomen is soft. There is no mass.     Tenderness: There is no abdominal tenderness.     Comments: Mild ascites  Musculoskeletal:        General: Normal range of motion.     Cervical back: Normal range of motion and neck supple.     Right lower leg: No edema.     Left lower leg: No edema.  Lymphadenopathy:     Cervical: No cervical adenopathy.  Skin:    General: Skin is warm and dry.  Neurological:     General: No focal deficit present.     Mental Status: She is alert and oriented to person, place, and time. Mental status is at baseline.  Psychiatric:        Mood  and Affect: Mood normal.        Behavior:  Behavior normal.        Thought Content: Thought content normal.        Judgment: Judgment normal.     LABS:    CBC Latest Ref Rng & Units 09/07/2020 08/26/2020 07/30/2020  WBC - 5.5 4.9 5.8  Hemoglobin 12.0 - 16.0 7.9(A) 7.6(A) 9.2(A)  Hematocrit 36 - 46 25(A) 23(A) 28(A)  Platelets 150 - 399 155 148(A) 150   CMP Latest Ref Rng & Units 09/07/2020 08/26/2020 07/30/2020  Glucose 70 - 99 mg/dL - - -  BUN 4 - 21 21 13 14   Creatinine 0.5 - 1.1 1.0 0.8 0.9  Sodium 137 - 147 139 138 140  Potassium 3.4 - 5.3 3.7 3.5 3.5  Chloride 99 - 108 107 107 106  CO2 13 - 22 20 22  24(A)  Calcium 8.7 - 10.7 9.4 9.9 9.9  Total Protein 6.0 - 8.3 g/dL - - -  Total Bilirubin 0.3 - 1.2 mg/dL - - -  Alkaline Phos 25 - 125 62 63 62  AST 13 - 35 36(A) 28 33  ALT 7 - 35 22 18 21     Lab Results  Component Value Date   TIBC 476 08/26/2020   TIBC 318 05/25/2020   FERRITIN 6.4 08/26/2020   FERRITIN 205 05/25/2020   IRONPCTSAT 5.2 08/26/2020   IRONPCTSAT 25 05/25/2020    STUDIES:  No results found.   Allergies: No Known Allergies  Current Medications: Current Outpatient Medications  Medication Sig Dispense Refill  . colesevelam (WELCHOL) 625 MG tablet Take 1,875 mg by mouth 2 (two) times daily.    . furosemide (LASIX) 40 MG tablet Take 1 tablet by mouth daily.    . insulin aspart (NOVOLOG) 100 UNIT/ML FlexPen Inject 30 Units into the skin 2 (two) times daily.    Marland Kitchen lactulose (CHRONULAC) 10 GM/15ML solution Take 20 g by mouth 1 day or 1 dose.  5  . LEVEMIR FLEXTOUCH 100 UNIT/ML Pen Inject 40 Units into the skin 2 (two) times daily.  12  . levofloxacin (LEVAQUIN) 750 MG tablet Take 750 mg by mouth daily.    Marland Kitchen levothyroxine (SYNTHROID) 50 MCG tablet Take 50 mcg by mouth daily.    Marland Kitchen liraglutide (VICTOZA) 18 MG/3ML SOPN Inject 1.8 mg into the skin daily.     Marland Kitchen lisinopril (PRINIVIL,ZESTRIL) 40 MG tablet Take 1 tablet by mouth daily.  3  . metFORMIN (GLUCOPHAGE-XR) 500 MG 24 hr tablet Take 500 mg by mouth 2  (two) times daily with a meal.  5  . montelukast (SINGULAIR) 10 MG tablet Take 10 mg by mouth at bedtime.    Marland Kitchen nystatin (MYCOSTATIN/NYSTOP) powder     . omeprazole (PRILOSEC) 20 MG capsule Take 1 capsule by mouth daily.  3  . oxyCODONE-acetaminophen (PERCOCET/ROXICET) 5-325 MG tablet Take 1 tablet by mouth every 4 (four) hours.    . pravastatin (PRAVACHOL) 20 MG tablet Take 1 tablet by mouth once a week.    . pregabalin (LYRICA) 75 MG capsule Take 75 mg by mouth at bedtime.  1  . rOPINIRole (REQUIP) 2 MG tablet Take 1 tablet by mouth at bedtime.  3  . spironolactone (ALDACTONE) 50 MG tablet Take 1 tablet (50 mg total) by mouth 2 (two) times daily. 60 tablet 5  . valACYclovir (VALTREX) 1000 MG tablet Take 1,000 mg by mouth 2 (two) times daily.    . Vitamin D, Ergocalciferol, (DRISDOL) 1.25 MG (  50000 UT) CAPS capsule Take 1 capsule by mouth once a week.     No current facility-administered medications for this visit.    ASSESSMENT & PLAN:   Assessment:  1. Iron deficiency, secondary to chronic GI blood loss from AVM's and recurrent epistaxis.  She last received IV iron in September 2021. Hemoglobin was 7.6 at the beginning of February and so she was transfused 2 units PRBC, however, her hemoglobin is back down to 7.9.  Therefore, we will plan to transfuse her 1 unit of PRBCs tomorrow morning as she is asymptomatic.  She is scheduled for 2 doses of IV iron on February 17th and 24th.  2. Non-alcoholic liver cirrhosis, with intermittent mild thrombocytopenia.  Her anemia is also likely partly related to the cirrhosis.  3. Ascites, for which she is on spironolactone and furosemide.  She will decrease the spironolactone to 50 mg daily.  4. Hepatic encephalopathy.  She has normalization of the ammonia.  She is back on lactulose twice daily and I advised that she continue this dose  5. Recurrent epistaxis, currently stable.  She has been seen by Dr. Gaylyn Cheers who packed the area and noted an AVM there.       6. Multiple AVM's of the small bowel seen on capsule endoscopy in [place], treated at [facility] in February 2021.  I am sure she is bleeding again, but I don't know much else that can be done.  7.  Splenic infarct.  Her pain is improving now.   Plan: As she is very symptomatic with her anemia today, we will add a type and cross and plan to transfuse 1 units of PRBC tomorrow.  She is scheduled for IV iron on February 17th and 24th.  Her serum ammonia has improved and so I advised that she continue lactulose twice daily.  She also knows to decrease the spironolactone to 50 mg once daily.  We will see her back in 2 weeks with CBC, CMP and serum ammonia for repeat evaluation.  She verbalizes understanding of and agreement to the plans discussed today.   I provided 30 minutes of face-to-face time during this this encounter and > 50% was spent counseling as documented under my assessment and plan.    Derwood Kaplan, MD [facility] [place]   I, Rita Ohara, am acting as scribe for Derwood Kaplan, MD  I have reviewed this report as typed by the medical scribe, and it is complete and accurate.

## 2020-09-07 NOTE — Telephone Encounter (Signed)
Per 2/14 los next appt sched and given to patient 

## 2020-09-07 NOTE — Telephone Encounter (Addendum)
Pt notified that she needs to come in today. I gave her appts for lab @ 2p, and to see [place] @ 230p.   ----- Message from Derwood Kaplan, MD sent at 09/07/2020 10:18 AM EST ----- Regarding: RE: Dropping things, mind feels fuzzy like before Her last ammonia was mildly elevated, how much lactulose is she taking?  She needs to come in for labs today, will order, and I probably need to see as well.  I guess it took over 2 weeks to set up her IV iron?  Still hasn't gotten it yet... ----- Message ----- From: Dairl Ponder, RN Sent: 09/07/2020   8:31 AM EST To: Derwood Kaplan, MD Subject: Dropping things, mind feels fuzzy like before  Pt states she needs an appt. "Dr Hinton Rao told me to call if I start feeling like this again. I'm shaking, dropping things. I am taking my lactulose. I don't have any energy". Denies fever, & COVID s/s.

## 2020-09-08 ENCOUNTER — Inpatient Hospital Stay: Payer: Medicare Other

## 2020-09-08 ENCOUNTER — Other Ambulatory Visit: Payer: Self-pay

## 2020-09-08 DIAGNOSIS — K7290 Hepatic failure, unspecified without coma: Secondary | ICD-10-CM | POA: Diagnosis not present

## 2020-09-08 DIAGNOSIS — D500 Iron deficiency anemia secondary to blood loss (chronic): Secondary | ICD-10-CM

## 2020-09-08 DIAGNOSIS — K7030 Alcoholic cirrhosis of liver without ascites: Secondary | ICD-10-CM | POA: Diagnosis not present

## 2020-09-08 DIAGNOSIS — E860 Dehydration: Secondary | ICD-10-CM | POA: Diagnosis not present

## 2020-09-08 DIAGNOSIS — R040 Epistaxis: Secondary | ICD-10-CM | POA: Diagnosis not present

## 2020-09-08 DIAGNOSIS — Q2733 Arteriovenous malformation of digestive system vessel: Secondary | ICD-10-CM | POA: Diagnosis not present

## 2020-09-08 MED ORDER — DIPHENHYDRAMINE HCL 25 MG PO CAPS
25.0000 mg | ORAL_CAPSULE | Freq: Once | ORAL | Status: AC
  Administered 2020-09-08: 25 mg via ORAL

## 2020-09-08 MED ORDER — SODIUM CHLORIDE 0.9% IV SOLUTION
250.0000 mL | Freq: Once | INTRAVENOUS | Status: AC
  Administered 2020-09-08: 250 mL via INTRAVENOUS
  Filled 2020-09-08: qty 250

## 2020-09-08 MED ORDER — ACETAMINOPHEN 325 MG PO TABS
650.0000 mg | ORAL_TABLET | Freq: Once | ORAL | Status: AC
  Administered 2020-09-08: 650 mg via ORAL

## 2020-09-08 MED ORDER — DIPHENHYDRAMINE HCL 25 MG PO CAPS
ORAL_CAPSULE | ORAL | Status: AC
  Filled 2020-09-08: qty 1

## 2020-09-08 MED ORDER — ACETAMINOPHEN 325 MG PO TABS
ORAL_TABLET | ORAL | Status: AC
  Filled 2020-09-08: qty 2

## 2020-09-08 NOTE — Patient Instructions (Signed)

## 2020-09-09 LAB — BPAM RBC
Blood Product Expiration Date: 202203052359
ISSUE DATE / TIME: 202202150822
Unit Type and Rh: 6200

## 2020-09-09 LAB — TYPE AND SCREEN
ABO/RH(D): A POS
Antibody Screen: NEGATIVE
Unit division: 0

## 2020-09-10 ENCOUNTER — Inpatient Hospital Stay: Payer: Medicare Other

## 2020-09-10 ENCOUNTER — Other Ambulatory Visit: Payer: Self-pay

## 2020-09-10 VITALS — BP 157/69 | HR 97 | Temp 98.5°F | Resp 18 | Wt 160.8 lb

## 2020-09-10 DIAGNOSIS — D509 Iron deficiency anemia, unspecified: Secondary | ICD-10-CM

## 2020-09-10 DIAGNOSIS — Q2733 Arteriovenous malformation of digestive system vessel: Secondary | ICD-10-CM | POA: Diagnosis not present

## 2020-09-10 DIAGNOSIS — D508 Other iron deficiency anemias: Secondary | ICD-10-CM

## 2020-09-10 DIAGNOSIS — K7290 Hepatic failure, unspecified without coma: Secondary | ICD-10-CM | POA: Diagnosis not present

## 2020-09-10 DIAGNOSIS — K7030 Alcoholic cirrhosis of liver without ascites: Secondary | ICD-10-CM | POA: Diagnosis not present

## 2020-09-10 DIAGNOSIS — E860 Dehydration: Secondary | ICD-10-CM | POA: Diagnosis not present

## 2020-09-10 DIAGNOSIS — D500 Iron deficiency anemia secondary to blood loss (chronic): Secondary | ICD-10-CM | POA: Diagnosis not present

## 2020-09-10 DIAGNOSIS — R040 Epistaxis: Secondary | ICD-10-CM | POA: Diagnosis not present

## 2020-09-10 MED ORDER — SODIUM CHLORIDE 0.9 % IV SOLN
510.0000 mg | Freq: Once | INTRAVENOUS | Status: AC
  Administered 2020-09-10: 510 mg via INTRAVENOUS
  Filled 2020-09-10: qty 510

## 2020-09-10 MED ORDER — SODIUM CHLORIDE 0.9 % IV SOLN
Freq: Once | INTRAVENOUS | Status: AC
  Filled 2020-09-10: qty 250

## 2020-09-10 NOTE — Patient Instructions (Signed)
Ferumoxytol injection What is this medicine? FERUMOXYTOL is an iron complex. Iron is used to make healthy red blood cells, which carry oxygen and nutrients throughout the body. This medicine is used to treat iron deficiency anemia. This medicine may be used for other purposes; ask your health care provider or pharmacist if you have questions. COMMON BRAND NAME(S): Feraheme What should I tell my health care provider before I take this medicine? They need to know if you have any of these conditions:  anemia not caused by low iron levels  high levels of iron in the blood  magnetic resonance imaging (MRI) test scheduled  an unusual or allergic reaction to iron, other medicines, foods, dyes, or preservatives  pregnant or trying to get pregnant  breast-feeding How should I use this medicine? This medicine is for injection into a vein. It is given by a health care professional in a hospital or clinic setting. Talk to your pediatrician regarding the use of this medicine in children. Special care may be needed. Overdosage: If you think you have taken too much of this medicine contact a poison control center or emergency room at once. NOTE: This medicine is only for you. Do not share this medicine with others. What if I miss a dose? It is important not to miss your dose. Call your doctor or health care professional if you are unable to keep an appointment. What may interact with this medicine? This medicine may interact with the following medications:  other iron products This list may not describe all possible interactions. Give your health care provider a list of all the medicines, herbs, non-prescription drugs, or dietary supplements you use. Also tell them if you smoke, drink alcohol, or use illegal drugs. Some items may interact with your medicine. What should I watch for while using this medicine? Visit your doctor or healthcare professional regularly. Tell your doctor or healthcare  professional if your symptoms do not start to get better or if they get worse. You may need blood work done while you are taking this medicine. You may need to follow a special diet. Talk to your doctor. Foods that contain iron include: whole grains/cereals, dried fruits, beans, or peas, leafy green vegetables, and organ meats (liver, kidney). What side effects may I notice from receiving this medicine? Side effects that you should report to your doctor or health care professional as soon as possible:  allergic reactions like skin rash, itching or hives, swelling of the face, lips, or tongue  breathing problems  changes in blood pressure  feeling faint or lightheaded, falls  fever or chills  flushing, sweating, or hot feelings  swelling of the ankles or feet Side effects that usually do not require medical attention (report to your doctor or health care professional if they continue or are bothersome):  diarrhea  headache  nausea, vomiting  stomach pain This list may not describe all possible side effects. Call your doctor for medical advice about side effects. You may report side effects to FDA at 1-800-FDA-1088. Where should I keep my medicine? This drug is given in a hospital or clinic and will not be stored at home. NOTE: This sheet is a summary. It may not cover all possible information. If you have questions about this medicine, talk to your doctor, pharmacist, or health care provider.  2021 Elsevier/Gold Standard (2016-08-29 20:21:10)  

## 2020-09-10 NOTE — Progress Notes (Signed)
1449:PT STABLE AT TIME OF DISCHARGE ?

## 2020-09-17 ENCOUNTER — Other Ambulatory Visit: Payer: Self-pay

## 2020-09-17 ENCOUNTER — Inpatient Hospital Stay: Payer: Medicare Other

## 2020-09-17 ENCOUNTER — Other Ambulatory Visit: Payer: Self-pay | Admitting: Oncology

## 2020-09-17 VITALS — BP 128/53 | HR 82 | Temp 98.5°F | Resp 18 | Ht 63.0 in | Wt 171.2 lb

## 2020-09-17 DIAGNOSIS — D508 Other iron deficiency anemias: Secondary | ICD-10-CM

## 2020-09-17 DIAGNOSIS — D509 Iron deficiency anemia, unspecified: Secondary | ICD-10-CM

## 2020-09-17 DIAGNOSIS — E860 Dehydration: Secondary | ICD-10-CM | POA: Diagnosis not present

## 2020-09-17 DIAGNOSIS — K7030 Alcoholic cirrhosis of liver without ascites: Secondary | ICD-10-CM | POA: Diagnosis not present

## 2020-09-17 DIAGNOSIS — D500 Iron deficiency anemia secondary to blood loss (chronic): Secondary | ICD-10-CM

## 2020-09-17 DIAGNOSIS — R040 Epistaxis: Secondary | ICD-10-CM | POA: Diagnosis not present

## 2020-09-17 DIAGNOSIS — K7290 Hepatic failure, unspecified without coma: Secondary | ICD-10-CM | POA: Diagnosis not present

## 2020-09-17 DIAGNOSIS — K7581 Nonalcoholic steatohepatitis (NASH): Secondary | ICD-10-CM

## 2020-09-17 DIAGNOSIS — K7460 Unspecified cirrhosis of liver: Secondary | ICD-10-CM

## 2020-09-17 DIAGNOSIS — Q2733 Arteriovenous malformation of digestive system vessel: Secondary | ICD-10-CM | POA: Diagnosis not present

## 2020-09-17 MED ORDER — SODIUM CHLORIDE 0.9 % IV SOLN
Freq: Once | INTRAVENOUS | Status: AC
  Filled 2020-09-17: qty 250

## 2020-09-17 MED ORDER — SODIUM CHLORIDE 0.9 % IV SOLN
510.0000 mg | Freq: Once | INTRAVENOUS | Status: AC
  Administered 2020-09-17: 510 mg via INTRAVENOUS
  Filled 2020-09-17: qty 17

## 2020-09-17 NOTE — Patient Instructions (Signed)
Ferumoxytol injection What is this medicine? FERUMOXYTOL is an iron complex. Iron is used to make healthy red blood cells, which carry oxygen and nutrients throughout the body. This medicine is used to treat iron deficiency anemia. This medicine may be used for other purposes; ask your health care provider or pharmacist if you have questions. COMMON BRAND NAME(S): Feraheme What should I tell my health care provider before I take this medicine? They need to know if you have any of these conditions:  anemia not caused by low iron levels  high levels of iron in the blood  magnetic resonance imaging (MRI) test scheduled  an unusual or allergic reaction to iron, other medicines, foods, dyes, or preservatives  pregnant or trying to get pregnant  breast-feeding How should I use this medicine? This medicine is for injection into a vein. It is given by a health care professional in a hospital or clinic setting. Talk to your pediatrician regarding the use of this medicine in children. Special care may be needed. Overdosage: If you think you have taken too much of this medicine contact a poison control center or emergency room at once. NOTE: This medicine is only for you. Do not share this medicine with others. What if I miss a dose? It is important not to miss your dose. Call your doctor or health care professional if you are unable to keep an appointment. What may interact with this medicine? This medicine may interact with the following medications:  other iron products This list may not describe all possible interactions. Give your health care provider a list of all the medicines, herbs, non-prescription drugs, or dietary supplements you use. Also tell them if you smoke, drink alcohol, or use illegal drugs. Some items may interact with your medicine. What should I watch for while using this medicine? Visit your doctor or healthcare professional regularly. Tell your doctor or healthcare  professional if your symptoms do not start to get better or if they get worse. You may need blood work done while you are taking this medicine. You may need to follow a special diet. Talk to your doctor. Foods that contain iron include: whole grains/cereals, dried fruits, beans, or peas, leafy green vegetables, and organ meats (liver, kidney). What side effects may I notice from receiving this medicine? Side effects that you should report to your doctor or health care professional as soon as possible:  allergic reactions like skin rash, itching or hives, swelling of the face, lips, or tongue  breathing problems  changes in blood pressure  feeling faint or lightheaded, falls  fever or chills  flushing, sweating, or hot feelings  swelling of the ankles or feet Side effects that usually do not require medical attention (report to your doctor or health care professional if they continue or are bothersome):  diarrhea  headache  nausea, vomiting  stomach pain This list may not describe all possible side effects. Call your doctor for medical advice about side effects. You may report side effects to FDA at 1-800-FDA-1088. Where should I keep my medicine? This drug is given in a hospital or clinic and will not be stored at home. NOTE: This sheet is a summary. It may not cover all possible information. If you have questions about this medicine, talk to your doctor, pharmacist, or health care provider.  2021 Elsevier/Gold Standard (2016-08-29 20:21:10) Dehydration, Adult Dehydration is condition in which there is not enough water or other fluids in the body. This happens when a person loses  more fluids than he or she takes in. Important body parts cannot work right without the right amount of fluids. Any loss of fluids from the body can cause dehydration. Dehydration can be mild, worse, or very bad. It should be treated right away to keep it from getting very bad. What are the causes? This  condition may be caused by:  Conditions that cause loss of water or other fluids, such as: ? Watery poop (diarrhea). ? Vomiting. ? Sweating a lot. ? Peeing (urinating) a lot.  Not drinking enough fluids, especially when you: ? Are ill. ? Are doing things that take a lot of energy to do.  Other illnesses and conditions, such as fever or infection.  Certain medicines, such as medicines that take extra fluid out of the body (diuretics).  Lack of safe drinking water.  Not being able to get enough water and food. What increases the risk? The following factors may make you more likely to develop this condition:  Having a long-term (chronic) illness that has not been treated the right way, such as: ? Diabetes. ? Heart disease. ? Kidney disease.  Being 61 years of age or older.  Having a disability.  Living in a place that is high above the ground or sea (high in altitude). The thinner, dried air causes more fluid loss.  Doing exercises that put stress on your body for a long time. What are the signs or symptoms? Symptoms of dehydration depend on how bad it is. Mild or worse dehydration  Thirst.  Dry lips or dry mouth.  Feeling dizzy or light-headed, especially when you stand up from sitting.  Muscle cramps.  Your body making: ? Dark pee (urine). Pee may be the color of tea. ? Less pee than normal. ? Less tears than normal.  Headache. Very bad dehydration  Changes in skin. Skin may: ? Be cold to the touch (clammy). ? Be blotchy or pale. ? Not go back to normal right after you lightly pinch it and let it go.  Little or no tears, pee, or sweat.  Changes in vital signs, such as: ? Fast breathing. ? Low blood pressure. ? Weak pulse. ? Pulse that is more than 100 beats a minute when you are sitting still.  Other changes, such as: ? Feeling very thirsty. ? Eyes that look hollow (sunken). ? Cold hands and feet. ? Being mixed up (confused). ? Being very tired  (lethargic) or having trouble waking from sleep. ? Short-term weight loss. ? Loss of consciousness. How is this treated? Treatment for this condition depends on how bad it is. Treatment should start right away. Do not wait until your condition gets very bad. Very bad dehydration is an emergency. You will need to go to a hospital.  Mild or worse dehydration can be treated at home. You may be asked to: ? Drink more fluids. ? Drink an oral rehydration solution (ORS). This drink helps get the right amounts of fluids and salts and minerals in the blood (electrolytes).  Very bad dehydration can be treated: ? With fluids through an IV tube. ? By getting normal levels of salts and minerals in your blood. This is often done by giving salts and minerals through a tube. The tube is passed through your nose and into your stomach. ? By treating the root cause. Follow these instructions at home: Oral rehydration solution If told by your doctor, drink an ORS:  Make an ORS. Use instructions on the package.  Start  by drinking small amounts, about  cup (120 mL) every 5-10 minutes.  Slowly drink more until you have had the amount that your doctor said to have. Eating and drinking  Drink enough clear fluid to keep your pee pale yellow. If you were told to drink an ORS, finish the ORS first. Then, start slowly drinking other clear fluids. Drink fluids such as: ? Water. Do not drink only water. Doing that can make the salt (sodium) level in your body get too low. ? Water from ice chips you suck on. ? Fruit juice that you have added water to (diluted). ? Low-calorie sports drinks.  Eat foods that have the right amounts of salts and minerals, such as: ? Bananas. ? Oranges. ? Potatoes. ? Tomatoes. ? Spinach.  Do not drink alcohol.  Avoid: ? Drinks that have a lot of sugar. These include:  High-calorie sports drinks.  Fruit juice that you did not add water to.  Soda.  Caffeine. ? Foods that  are greasy or have a lot of fat or sugar.         General instructions  Take over-the-counter and prescription medicines only as told by your doctor.  Do not take salt tablets. Doing that can make the salt level in your body get too high.  Return to your normal activities as told by your doctor. Ask your doctor what activities are safe for you.  Keep all follow-up visits as told by your doctor. This is important. Contact a doctor if:  You have pain in your belly (abdomen) and the pain: ? Gets worse. ? Stays in one place.  You have a rash.  You have a stiff neck.  You get angry or annoyed (irritable) more easily than normal.  You are more tired or have a harder time waking than normal.  You feel: ? Weak or dizzy. ? Very thirsty. Get help right away if you have:  Any symptoms of very bad dehydration.  Symptoms of vomiting, such as: ? You cannot eat or drink without vomiting. ? Your vomiting gets worse or does not go away. ? Your vomit has blood or green stuff in it.  Symptoms that get worse with treatment.  A fever.  A very bad headache.  Problems with peeing or pooping (having a bowel movement), such as: ? Watery poop that gets worse or does not go away. ? Blood in your poop (stool). This may cause poop to look black and tarry. ? Not peeing in 6-8 hours. ? Peeing only a small amount of very dark pee in 6-8 hours.  Trouble breathing. These symptoms may be an emergency. Do not wait to see if the symptoms will go away. Get medical help right away. Call your local emergency services (911 in the [place] Do not drive yourself to the hospital. Summary  Dehydration is a condition in which there is not enough water or other fluids in the body. This happens when a person loses more fluids than he or she takes in.  Treatment for this condition depends on how bad it is. Treatment should be started right away. Do not wait until your condition gets very bad.  Drink enough  clear fluid to keep your pee pale yellow. If you were told to drink an oral rehydration solution (ORS), finish the ORS first. Then, start slowly drinking other clear fluids.  Take over-the-counter and prescription medicines only as told by your doctor.  Get help right away if you have any symptoms of  very bad dehydration. This information is not intended to replace advice given to you by your health care provider. Make sure you discuss any questions you have with your health care provider. Document Revised: 02/21/2019 Document Reviewed: 02/21/2019 [employer] Patient Education  [employer].

## 2020-09-17 NOTE — Progress Notes (Signed)
[facility]  [address] [place],  [place]  [address] [phone]  Clinic Day:  09/21/2020  Referring physician: Nicoletta Dress, MD   This document serves as a record of services personally performed by Hosie Poisson, MD. It was created on their behalf by Curry,Lauren E, a trained medical scribe. The creation of this record is based on the scribe's personal observations and the provider's statements to them.  CHIEF COMPLAINT:  CC: Iron deficiency anemia  Current Treatment:  Iron supplement  HISTORY OF PRESENT ILLNESS:  [name] is a 70 y.o. female who we began seeing in December 2019 for iron deficiency anemia.  She was found to have colon polyps with pathology showing tubular adenomas in November 2018.  She also had upper endoscopy in January 2018 with no specific findings.  She was clearly iron deficient.  She has multiple comorbidities, including diabetes, liver cirrhosis, COPD, hyperlipidemia, and degenerative disc disease.  Testing for any other deficiency or monoclonal spike was negative.  She had already been on oral supplement for nearly 1 year with lack of response, so was given IV iron in the form of Feraheme.  She felt somewhat better after the IV iron, but still complained of fatigue and weakness.  She was once again given IV Feraheme in June.  Her hemoglobin improved, but was still low at 11.0.  She underwent colonoscopy and EGD with biopsy in August 2020 with Dr. Noberto Retort, which was negative.  On September 1st her hemoglobin had dropped from 9.8 to 9.0 through Dr. Delena Bali.  When she was seen here on September 24th, the hemoglobin had dropped down to 7.5, so she was transfused with 2 units of packed red blood cells.  Repeat iron studies once again revealed iron deficiency.  She was given IV Feraheme again.  CT of abdomen and pelvis was negative.  She felt so much better after the transfusion that she jumped out of bed and  fractured her right foot in a fall.  She had a drop in her hemoglobin again in November and  was given IV Feraheme once more.  Capsule endoscopy in November 2020 in [facility] revealed multiple AVM's of the small bowel.    Her hemoglobin with Dr. Delena Bali was 9.6 on March 8th 2021, but decreased down to 7.6 on March 31st.  She therefore received IV Feraheme again in early April. She had cauterization of an AVM of the small bowel at [facility] in February 2021.  Abdominal ultrasound from May 20th showed cirrhosis of the liver with nodular surface, increased echogenicity and heterogeneity.  No focal liver parenchymal lesion was observed.  She has had intermittent mild thrombocytopenia, presumably from her liver cirrhosis. She received IV iron in early June, but only received one dose due to her trip to [place], she also received 2 units of packed red blood cells at that time. She presented to the [facility] emergency department on June 13th due to generalized weakness that had been ongoing for 1 week, as well as altered mental status, and was admitted.  She had just returned from a trip to [place].  While out there, she developed severe epistaxis that required packing and 1 unit of packed red blood cells.  CT imaging revealed small amount of blood in the right maxillary and sphenoid sinuses with tube placed in the right paranasal sinus.  She was found to have a UTI.  Her hemoglobin improved from 9.0 in early June to 9.7.  She developed a severe epistaxis for the 3rd time this year, requiring hospital admission in late July and had a nasal bullet in place.  Her hemoglobin dropped down to 6.9 and she has been transfused, and given a dose of IV Feraheme while in the hospital.  Her hemoglobin was still only 8.2 on July 25th, but slowly came up after that.  She had an episode of hypotension and fever and was felt to be septic and transferred to the ICU.  She was placed on broad-spectrum IV antibiotics, and cultures  remained negative.  An echocardiogram from July 27th showed a suspicious mass in the right atrium, which may be attached to the interatrial septum.  The cardiologist reviewed the echocardiogram and felt that this was likely an artifact and not a mass, vegetation or thrombus.  The patient continued to complain of pain from the compression device in her nose and described some green drainage from her sinuses, potentially the source of her fever and sepsis.  Her hemoglobin had come up to 8.7 by the time of discharge on July 27th.  She was seen on August 6th for follow up and reported weakness of her hands and and shakiness of her arms and legs.  She was occasionally dropping objects.  She also reported fatigue, dyspnea with exertion and moderate abdominal swelling.  She had a recent follow-up at Dr. Julien Nordmann office with a good cardiac report.  Due to the asterixis, an ammonia level was obtained, and was elevated at 62.  She was therefore placed on lactulose 30 mL twice a day. She was seen a week later with some improvement, but her serum ammonia was still 54, so the lactulose was increased to TID. She also had hyperkalemia, so her spironolactone 25 mg was held. We now have resumed one pill daily along with Lasix 40 mg daily due to recurrent ascites.  She was transfused with 2 units of PRBCs again at the end of September when her hemoglobin dropped down to 7.3.  Iron level is 13.0 with a TIBC of 415 for a saturation of 3.1%. She was scheduled for 2 more doses of IV Feraheme.  She was admitted on October 20th with a fever of 105 and had E coli sepsis, resistant to Ampicillin only.  I placed her lactulose on hold due to severe diarrhea, adding to her dehydration. Her serum ammonia came down to 9 we did not resume lactulose at that time.  She did have a new splenic infarction as of this admission, which was causing a lot of left abdominal pain. Her spironolactone was increased to 50 mg to take 1-2 daily.   When she was  seen two weeks ago, her CBC reveals a hemoglobin of 7.6 with normal ANC and platelets.  She was therefore transfused 2 units of PRBCs.  Iron studies today are low with saturation 5.2, TIBC 476 and ferritin 6.4. Ammonia level was elevated at 35, and so her lactulose was increased to 30 cc twice daily.  She was scheduled for IV iron, but has not received that yet.  She continues to have blood loss with black stools and one episode of moderate epistaxis, her hemoglobin was up to 9.0 in early February, but down to 7.9 when I saw her on February 14th.  We transfused her with 1 unit of PRBCs and arranged for repeat iron transfusions.  She is very frustrated by the constant recurrent bleeding which we still think is from her AVM's of the small bowel and  she would like to pursue any possible procedures to address that.    INTERVAL HISTORY:  [name] is here for routine follow up and states that she is doing fairly well at this time other than fatigue.  She received her 2nd dose of IV Feraheme on February 24th and her hemoglobin has improved from 7.9 to 9.2.  She did have a rough day Saturday with insomnia and abdominal distention, but this improved by Sunday after moving her bowels.  She continues lactulose BID and spironolactone once daily.  White count and platelets are normal.  Chemistries are unremarkable for a calcium of 11.0, and a blood glucose of 216.  Her serum ammonia has improved from 28.0 to 14.0.  Her  appetite is good, and she has gained 4 pounds since her last visit.  She denies fever, chills or other signs of infection.  She denies nausea, vomiting, bowel issues, or abdominal pain.  She denies sore throat, cough, dyspnea, or chest pain.  REVIEW OF SYSTEMS:  Review of Systems  Constitutional: Positive for fatigue. Negative for appetite change, chills, fever and unexpected weight change.  HENT:  Negative.   Eyes: Negative.   Respiratory: Negative.  Negative for chest tightness, cough, hemoptysis,  shortness of breath and wheezing.   Cardiovascular: Negative.  Negative for chest pain, leg swelling and palpitations.  Gastrointestinal: Negative.  Negative for abdominal distention, abdominal pain, blood in stool, constipation, diarrhea, nausea and vomiting.  Endocrine: Negative.   Genitourinary: Negative.  Negative for difficulty urinating, dysuria, frequency and hematuria.   Musculoskeletal: Negative.  Negative for arthralgias, back pain, flank pain, gait problem and myalgias.  Skin: Negative.   Neurological: Negative.  Negative for dizziness, extremity weakness, gait problem, headaches, light-headedness, numbness, seizures and speech difficulty.  Hematological: Negative.   Psychiatric/Behavioral: Negative.  Negative for depression and sleep disturbance. The patient is not nervous/anxious.      VITALS:  Blood pressure (!) 156/67, pulse 90, temperature 97.8 F (36.6 C), temperature source Oral, resp. rate 18, height 5\' 3"  (1.6 m), weight 168 lb 9.6 oz (76.5 kg), SpO2 98 %.  Wt Readings from Last 3 Encounters:  09/21/20 168 lb 9.6 oz (76.5 kg)  09/17/20 171 lb 4 oz (77.7 kg)  09/10/20 160 lb 12 oz (72.9 kg)    Body mass index is 29.87 kg/m.  Performance status (ECOG): 1 - Symptomatic but completely ambulatory  PHYSICAL EXAM:  Physical Exam Constitutional:      General: She is not in acute distress.    Appearance: Normal appearance. She is normal weight.  HENT:     Head: Normocephalic and atraumatic.  Eyes:     General: No scleral icterus.    Extraocular Movements: Extraocular movements intact.     Conjunctiva/sclera: Conjunctivae normal.     Pupils: Pupils are equal, round, and reactive to light.  Cardiovascular:     Rate and Rhythm: Normal rate and regular rhythm.     Pulses: Normal pulses.     Heart sounds: Normal heart sounds. No murmur heard. No friction rub. No gallop.   Pulmonary:     Effort: Pulmonary effort is normal. No respiratory distress.     Breath sounds:  Wheezing (slight, expiratory) present.  Abdominal:     General: Bowel sounds are normal. There is no distension.     Palpations: Abdomen is soft. There is no hepatomegaly, splenomegaly or mass.     Tenderness: There is no abdominal tenderness.     Comments: She has mild to moderate  ascites  Musculoskeletal:        General: Normal range of motion.     Cervical back: Normal range of motion and neck supple.     Right lower leg: No edema.     Left lower leg: No edema.  Lymphadenopathy:     Cervical: No cervical adenopathy.  Skin:    General: Skin is warm and dry.  Neurological:     General: No focal deficit present.     Mental Status: She is alert and oriented to person, place, and time. Mental status is at baseline.  Psychiatric:        Mood and Affect: Mood normal.        Behavior: Behavior normal.        Thought Content: Thought content normal.        Judgment: Judgment normal.     LABS:    CBC Latest Ref Rng & Units 09/21/2020 09/07/2020 08/26/2020  WBC - 4.5 5.5 4.9  Hemoglobin 12.0 - 16.0 9.2(A) 7.9(A) 7.6(A)  Hematocrit 36 - 46 28(A) 25(A) 23(A)  Platelets 150 - 399 132(A) 155 148(A)   CMP Latest Ref Rng & Units 09/21/2020 09/07/2020 08/26/2020  Glucose 70 - 99 mg/dL - - -  BUN 4 - 21 12 21 13   Creatinine 0.5 - 1.1 0.9 1.0 0.8  Sodium 137 - 147 137 139 138  Potassium 3.4 - 5.3 3.6 3.7 3.5  Chloride 99 - 108 101 107 107  CO2 13 - 22 26(A) 20 22  Calcium 8.7 - 10.7 11.0(A) 9.4 9.9  Total Protein 6.0 - 8.3 g/dL - - -  Total Bilirubin 0.3 - 1.2 mg/dL - - -  Alkaline Phos 25 - 125 53 62 63  AST 13 - 35 23 36(A) 28  ALT 7 - 35 33 22 18    Lab Results  Component Value Date   TIBC 476 08/26/2020   TIBC 318 05/25/2020   FERRITIN 6.4 08/26/2020   FERRITIN 205 05/25/2020   IRONPCTSAT 5.2 08/26/2020   IRONPCTSAT 25 05/25/2020    STUDIES:  No results found.   Allergies: No Known Allergies  Current Medications: Current Outpatient Medications  Medication Sig Dispense  Refill  . colesevelam (WELCHOL) 625 MG tablet Take 1,875 mg by mouth 2 (two) times daily.    . furosemide (LASIX) 40 MG tablet Take 1 tablet by mouth daily.    . insulin aspart (NOVOLOG) 100 UNIT/ML FlexPen Inject 30 Units into the skin 2 (two) times daily.    Marland Kitchen lactulose (CHRONULAC) 10 GM/15ML solution Take 20 g by mouth 1 day or 1 dose.  5  . LEVEMIR FLEXTOUCH 100 UNIT/ML Pen Inject 40 Units into the skin 2 (two) times daily.  12  . levofloxacin (LEVAQUIN) 750 MG tablet Take 750 mg by mouth daily.    Marland Kitchen levothyroxine (SYNTHROID) 50 MCG tablet Take 50 mcg by mouth daily.    Marland Kitchen liraglutide (VICTOZA) 18 MG/3ML SOPN Inject 1.8 mg into the skin daily.     Marland Kitchen lisinopril (PRINIVIL,ZESTRIL) 40 MG tablet Take 1 tablet by mouth daily.  3  . metFORMIN (GLUCOPHAGE-XR) 500 MG 24 hr tablet Take 500 mg by mouth 2 (two) times daily with a meal.  5  . montelukast (SINGULAIR) 10 MG tablet Take 10 mg by mouth at bedtime.    Marland Kitchen nystatin (MYCOSTATIN/NYSTOP) powder     . omeprazole (PRILOSEC) 20 MG capsule Take 1 capsule by mouth daily.  3  . oxyCODONE-acetaminophen (PERCOCET/ROXICET) 5-325 MG tablet  Take 1 tablet by mouth every 4 (four) hours.    . pravastatin (PRAVACHOL) 20 MG tablet Take 1 tablet by mouth once a week.    . pregabalin (LYRICA) 75 MG capsule Take 75 mg by mouth at bedtime.  1  . rOPINIRole (REQUIP) 2 MG tablet Take 1 tablet by mouth at bedtime.  3  . spironolactone (ALDACTONE) 50 MG tablet Take 1 tablet (50 mg total) by mouth 2 (two) times daily. 60 tablet 5  . valACYclovir (VALTREX) 1000 MG tablet Take 1,000 mg by mouth 2 (two) times daily.    . Vitamin D, Ergocalciferol, (DRISDOL) 1.25 MG (50000 UT) CAPS capsule Take 1 capsule by mouth once a week.     No current facility-administered medications for this visit.    ASSESSMENT & PLAN:   Assessment:  1. Iron deficiency, secondary to chronic GI blood loss from AVM's and recurrent epistaxis.  She has required constant iron infusions and  transfusions, so we want to pursue any possible treatment for her AVM's. Hemoglobin was 7.6 at the beginning of February and so she was transfused 2 units PRBC, however, her hemoglobin dropped back down to 7.9.  Therefore, we transfused her 1 unit of PRBCs and she received 2 doses of IV iron on February 17th and 24th.  Her hemoglobin has now improved to 9.2.  2. Non-alcoholic liver cirrhosis, with intermittent mild thrombocytopenia.  Her anemia is also likely partly related to the cirrhosis.  3. Ascites, for which she is on spironolactone and furosemide.  She will continue spironolactone 50 mg daily.  4. Hepatic encephalopathy.  She has normalization of the ammonia.  She is back on lactulose twice daily and I advised that she continue this dose  5. Recurrent epistaxis, currently stable.  She has been seen by Dr. Gaylyn Cheers who packed the area and noted an AVM there.      6. Multiple AVM's of the small bowel seen on capsule endoscopy in [place], treated at [facility] in February 2021.  I am sure she is bleeding again, but I don't know much else that can be done.  We will refer her to [facility] to see if the gastroenterologist could do further evaluation and treatment of her chronic blood loss.  She has had prior bleeding scans which did document bleeding from the small bowel.    7.  Splenic infarct.   8.  Hypercalcemia.  This is a new finding and may be dietary so she will modify her diet and we will monitor this.  Plan: Her hemoglobin has improved now measuring 9.2, but she continues to feel fatigued.  She knows to continue lactulose twice daily and spironolactone 50 mg once daily.  As requested, we will refer her to Dr. Cristi Loron, gastroenterologist at [facility] for further evaluation of her chronic bleeding.  We will see her back in 2 weeks with CBC, CMP and serum ammonia for repeat evaluation.  She verbalizes understanding of and agreement to the plans discussed  today.   I provided 30 minutes of face-to-face time during this this encounter and > 50% was spent counseling as documented under my assessment and plan.    Derwood Kaplan, MD [facility] [place]   I, Rita Ohara, am acting as scribe for Derwood Kaplan, MD  I have reviewed this report as typed by the medical scribe, and it is complete and accurate.

## 2020-09-21 ENCOUNTER — Encounter: Payer: Self-pay | Admitting: Oncology

## 2020-09-21 ENCOUNTER — Telehealth: Payer: Self-pay | Admitting: Oncology

## 2020-09-21 ENCOUNTER — Other Ambulatory Visit: Payer: Self-pay | Admitting: Hematology and Oncology

## 2020-09-21 ENCOUNTER — Other Ambulatory Visit: Payer: Self-pay

## 2020-09-21 ENCOUNTER — Other Ambulatory Visit: Payer: Self-pay | Admitting: Oncology

## 2020-09-21 ENCOUNTER — Inpatient Hospital Stay: Payer: Medicare Other

## 2020-09-21 ENCOUNTER — Inpatient Hospital Stay (INDEPENDENT_AMBULATORY_CARE_PROVIDER_SITE_OTHER): Payer: Medicare Other | Admitting: Oncology

## 2020-09-21 VITALS — BP 156/67 | HR 90 | Temp 97.8°F | Resp 18 | Ht 63.0 in | Wt 168.6 lb

## 2020-09-21 DIAGNOSIS — D500 Iron deficiency anemia secondary to blood loss (chronic): Secondary | ICD-10-CM | POA: Diagnosis not present

## 2020-09-21 DIAGNOSIS — D649 Anemia, unspecified: Secondary | ICD-10-CM | POA: Diagnosis not present

## 2020-09-21 DIAGNOSIS — D509 Iron deficiency anemia, unspecified: Secondary | ICD-10-CM | POA: Diagnosis not present

## 2020-09-21 LAB — HEPATIC FUNCTION PANEL
ALT: 33 (ref 7–35)
AST: 23 (ref 13–35)
Alkaline Phosphatase: 53 (ref 25–125)
Bilirubin, Total: 0.8

## 2020-09-21 LAB — BASIC METABOLIC PANEL
BUN: 12 (ref 4–21)
CO2: 26 — AB (ref 13–22)
Chloride: 101 (ref 99–108)
Creatinine: 0.9 (ref 0.5–1.1)
Glucose: 216
Potassium: 3.6 (ref 3.4–5.3)
Sodium: 137 (ref 137–147)

## 2020-09-21 LAB — CBC: RBC: 2.96 — AB (ref 3.87–5.11)

## 2020-09-21 LAB — CBC AND DIFFERENTIAL
HCT: 28 — AB (ref 36–46)
Hemoglobin: 9.2 — AB (ref 12.0–16.0)
Neutrophils Absolute: 3.02
Platelets: 132 — AB (ref 150–399)
WBC: 4.5

## 2020-09-21 LAB — COMPREHENSIVE METABOLIC PANEL
Albumin: 3.9 (ref 3.5–5.0)
Calcium: 11 — AB (ref 8.7–10.7)

## 2020-09-21 LAB — AMMONIA: Ammonia: 14

## 2020-09-21 NOTE — Telephone Encounter (Signed)
Per 2/28 LOS, patient scheduled for March Appt's.  Gave patient Appt Summary

## 2020-10-02 NOTE — Progress Notes (Signed)
[name]  [address] [place],  [name]  [address] [phone]  Clinic Day:  10/05/2020  Referring physician: Nicoletta Dress, MD   This document serves as a record of services personally performed by Hosie Poisson, MD. It was created on their behalf by [facility], a trained medical scribe. The creation of this record is based on the scribe's personal observations and the provider's statements to them.  CHIEF COMPLAINT:  CC: Iron deficiency anemia  Current Treatment:  Iron supplement  HISTORY OF PRESENT ILLNESS:  [name] is a 70 y.o. female who we began seeing in December 2019 for iron deficiency anemia.  She was found to have colon polyps with pathology showing tubular adenomas in November 2018.  She also had upper endoscopy in January 2018 with no specific findings.  She was clearly iron deficient.  She has multiple comorbidities, including diabetes, liver cirrhosis, COPD, hyperlipidemia, and degenerative disc disease.  Testing for any other deficiency or monoclonal spike was negative.  She had already been on oral supplement for nearly 1 year with lack of response, so was given IV iron in the form of Feraheme.  She felt somewhat better after the IV iron, but still complained of fatigue and weakness.  She was once again given IV Feraheme in June.  Her hemoglobin improved, but was still low at 11.0.  She underwent colonoscopy and EGD with biopsy in August 2020 with Dr. Noberto Retort, which was negative.  On September 1st her hemoglobin had dropped from 9.8 to 9.0 through Dr. Del[place] Bali.  When she was seen here on September 24th, the hemoglobin had dropped down to 7.5, so she was transfused with 2 units of packed red blood cells.  Repeat iron studies once again revealed iron deficiency.  She was given IV Feraheme again.  CT of abdomen and pelvis was negative.  She felt so much better after the transfusion that she jumped out of bed and  fractured her right foot in a fall.  She had a drop in her hemoglobin again in November and  was given IV Feraheme once more.  Capsule endoscopy in November 2020 in [facility] revealed multiple AVM's of the small bowel.    Her hemoglobin with Dr. Delena Bali was 9.6 on March 8th 2021, but decreased down to 7.6 on March 31st.  She therefore received IV Feraheme again in early April. She had cauterization of an AVM of the small bowel at [facility] in February 2021.  Abdominal ultrasound from May 20th showed cirrhosis of the liver with nodular surface, increased echogenicity and heterogeneity.  No focal liver parenchymal lesion was observed.  She has had intermittent mild thrombocytopenia, presumably from her liver cirrhosis. She received IV iron in early June, but only received one dose due to her trip to [place], she also received 2 units of packed red blood cells at that time. She presented to the [facility] emergency department on June 13th due to generalized weakness that had been ongoing for 1 week, as well as altered mental status, and was admitted.  She had just returned from a trip to [place].  While out there, she developed severe epistaxis that required packing and 1 unit of packed red blood cells.  CT imaging revealed small amount of blood in the right maxillary and sphenoid sinuses with tube placed in the right paranasal sinus.  She was found to have a UTI.  Her hemoglobin improved from 9.0 in early June to 9.7.  She developed a severe epistaxis for the 3rd time this year, requiring hospital admission in late July and had a nasal bullet in place.  Her hemoglobin dropped down to 6.9 and she has been transfused, and given a dose of IV Feraheme while in the hospital.  Her hemoglobin was still only 8.2 on July 25th, but slowly came up after that.  She had an episode of hypotension and fever and was felt to be septic and transferred to the ICU.  She was placed on broad-spectrum IV antibiotics, and cultures  remained negative.  An echocardiogram from July 27th showed a suspicious mass in the right atrium, which may be attached to the interatrial septum.  The cardiologist reviewed the echocardiogram and felt that this was likely an artifact and not a mass, vegetation or thrombus.  The patient continued to complain of pain from the compression device in her nose and described some green drainage from her sinuses, potentially the source of her fever and sepsis.  Her hemoglobin had come up to 8.7 by the time of discharge on July 27th.  She was seen on August 6th for follow up and reported weakness of her hands and and shakiness of her arms and legs.  She was occasionally dropping objects.  She also reported fatigue, dyspnea with exertion and moderate abdominal swelling.  She had a recent follow-up at Dr. Julien Nordmann office with a good cardiac report.  Due to the asterixis, an ammonia level was obtained, and was elevated at 62.  She was therefore placed on lactulose 30 mL twice a day. She was seen a week later with some improvement, but her serum ammonia was still 54, so the lactulose was increased to TID. She also had hyperkalemia, so her spironolactone 25 mg was held. We now have resumed one pill daily along with Lasix 40 mg daily due to recurrent ascites.  She was transfused with 2 units of PRBCs again at the end of September when her hemoglobin dropped down to 7.3.  Iron level is 13.0 with a TIBC of 415 for a saturation of 3.1%. She was scheduled for 2 more doses of IV Feraheme.  She was admitted on October 20th with a fever of 105 and had E coli sepsis, resistant to Ampicillin only.  I placed her lactulose on hold due to severe diarrhea, adding to her dehydration. Her serum ammonia came down to 9 we did not resume lactulose at that time.  She did have a new splenic infarction as of this admission, which was causing a lot of left abdominal pain. Her spironolactone was increased to 50 mg to take 1-2 daily.   When she was  seen two weeks ago, her CBC reveals a hemoglobin of 7.6 with normal ANC and platelets.  She was therefore transfused 2 units of PRBCs.  Iron studies today are low with saturation 5.2, TIBC 476 and ferritin 6.4. Ammonia level was elevated at 35, and so her lactulose was increased to 30 cc twice daily.  She was scheduled for IV iron, but has not received that yet.  She continues to have blood loss with black stools and one episode of moderate epistaxis, her hemoglobin was up to 9.0 in early February, but down to 7.9 when I saw her on February 14th.  We transfused her with 1 unit of PRBCs and arranged for repeat iron transfusions.  She is very frustrated by the constant recurrent bleeding which we still think is from her AVM's of the small bowel and  she would like to pursue any possible procedures to address that.    INTERVAL HISTORY:  [name] is here for routine follow up and notes abdominal distention and left upper abdominal pain.  Her fatigue and weakness is somewhat better.  She continues lasix 40 mg daily, lactulose BID and spironolactone 50 mg twice daily.  I advised that she increase the spironolactone 50 mg to TID to help her ascites as well as her hypokalemia.  She also notes dysuria, and so we will obtain a urinalysis and urine culture today.  Her hemoglobin has improved from 9.2 to 10.2, and her white count and platelets are normal.  Chemistries are unremarkable except for a potassium of 3.3, and a non-fasting blood glucose of 240.  Serum ammonia is normal at 12.0.  Her  appetite is good, and she has gained 4 pounds since her last visit.  She denies fever, chills or other signs of infection.  She denies nausea, vomiting or  bowel issues.  She denies sore throat, cough, dyspnea, or chest pain.  REVIEW OF SYSTEMS:  Review of Systems  Constitutional: Positive for fatigue. Negative for appetite change, chills, fever and unexpected weight change.  HENT:  Negative.   Eyes: Negative.   Respiratory:  Negative.  Negative for chest tightness, cough, hemoptysis, shortness of breath and wheezing.   Cardiovascular: Negative.  Negative for chest pain, leg swelling and palpitations.  Gastrointestinal: Positive for abdominal distention and abdominal pain (left upper quadrant). Negative for blood in stool, constipation, diarrhea, nausea and vomiting.  Endocrine: Negative.   Genitourinary: Positive for dysuria. Negative for difficulty urinating, frequency and hematuria.   Musculoskeletal: Negative.  Negative for arthralgias, back pain, flank pain, gait problem and myalgias.  Skin: Negative.   Neurological: Negative.  Negative for dizziness, extremity weakness, gait problem, headaches, light-headedness, numbness, seizures and speech difficulty.  Hematological: Negative.   Psychiatric/Behavioral: Negative.  Negative for depression and sleep disturbance. The patient is not nervous/anxious.      VITALS:  Blood pressure (!) 149/67, pulse 89, temperature 97.8 F (36.6 C), temperature source Oral, resp. rate 18, height 5\' 3"  (1.6 m), weight 172 lb 9.6 oz (78.3 kg), SpO2 98 %.  Wt Readings from Last 3 Encounters:  10/05/20 172 lb 9.6 oz (78.3 kg)  09/21/20 168 lb 9.6 oz (76.5 kg)  09/17/20 171 lb 4 oz (77.7 kg)    Body mass index is 30.57 kg/m.  Performance status (ECOG): 1 - Symptomatic but completely ambulatory  PHYSICAL EXAM:  Physical Exam Constitutional:      General: She is not in acute distress.    Appearance: Normal appearance. She is normal weight.  HENT:     Head: Normocephalic and atraumatic.  Eyes:     General: No scleral icterus.    Extraocular Movements: Extraocular movements intact.     Conjunctiva/sclera: Conjunctivae normal.     Pupils: Pupils are equal, round, and reactive to light.  Cardiovascular:     Rate and Rhythm: Normal rate and regular rhythm.     Pulses: Normal pulses.     Heart sounds: Murmur heard.   Systolic murmur is present with a grade of 1/6. No friction  rub. No gallop.   Pulmonary:     Effort: Pulmonary effort is normal. No respiratory distress.     Breath sounds: No wheezing.  Abdominal:     General: Bowel sounds are normal. There is no distension.     Palpations: Abdomen is soft. There is hepatomegaly (mild to moderate) and  splenomegaly (I cannot measure due to the large amount of ascites). There is no mass.     Tenderness: There is no abdominal tenderness.     Comments: She has a large amount of ascites.    Musculoskeletal:        General: Normal range of motion.     Cervical back: Normal range of motion and neck supple.     Right lower leg: No edema.     Left lower leg: No edema.  Lymphadenopathy:     Cervical: No cervical adenopathy.  Skin:    General: Skin is warm and dry.  Neurological:     General: No focal deficit present.     Mental Status: She is alert and oriented to person, place, and time. Mental status is at baseline.  Psychiatric:        Mood and Affect: Mood normal.        Behavior: Behavior normal.        Thought Content: Thought content normal.        Judgment: Judgment normal.     LABS:    CBC Latest Ref Rng & Units 10/05/2020 09/21/2020 09/07/2020  WBC - 4.9 4.5 5.5  Hemoglobin 12.0 - 16.0 10.2(A) 9.2(A) 7.9(A)  Hematocrit 36 - 46 31(A) 28(A) 25(A)  Platelets 150 - 399 138(A) 132(A) 155   CMP Latest Ref Rng & Units 10/05/2020 09/21/2020 09/07/2020  Glucose 70 - 99 mg/dL - - -  BUN 4 - 21 15 12 21   Creatinine 0.5 - 1.1 0.8 0.9 1.0  Sodium 137 - 147 140 137 139  Potassium 3.4 - 5.3 3.3(A) 3.6 3.7  Chloride 99 - 108 112(A) 101 107  CO2 13 - 22 20 26(A) 20  Calcium 8.7 - 10.7 9.5 11.0(A) 9.4  Total Protein 6.0 - 8.3 g/dL - - -  Total Bilirubin 0.3 - 1.2 mg/dL - - -  Alkaline Phos 25 - 125 64 53 62  AST 13 - 35 32 23 36(A)  ALT 7 - 35 21 33 22    Lab Results  Component Value Date   TIBC 476 08/26/2020   TIBC 318 05/25/2020   FERRITIN 6.4 08/26/2020   FERRITIN 205 05/25/2020   IRONPCTSAT 5.2  08/26/2020   IRONPCTSAT 25 05/25/2020    STUDIES:  No results found.   Allergies: No Known Allergies  Current Medications: Current Outpatient Medications  Medication Sig Dispense Refill  . colesevelam (WELCHOL) 625 MG tablet Take 1,875 mg by mouth 2 (two) times daily.    . furosemide (LASIX) 40 MG tablet Take 1 tablet by mouth daily.    . insulin aspart (NOVOLOG) 100 UNIT/ML FlexPen Inject 30 Units into the skin 2 (two) times daily.    Marland Kitchen lactulose (CHRONULAC) 10 GM/15ML solution Take 20 g by mouth 1 day or 1 dose.  5  . LEVEMIR FLEXTOUCH 100 UNIT/ML Pen Inject 40 Units into the skin 2 (two) times daily.  12  . levofloxacin (LEVAQUIN) 750 MG tablet Take 750 mg by mouth daily.    Marland Kitchen levothyroxine (SYNTHROID) 50 MCG tablet Take 50 mcg by mouth daily.    Marland Kitchen liraglutide (VICTOZA) 18 MG/3ML SOPN Inject 1.8 mg into the skin daily.     Marland Kitchen lisinopril (PRINIVIL,ZESTRIL) 40 MG tablet Take 1 tablet by mouth daily.  3  . metFORMIN (GLUCOPHAGE-XR) 500 MG 24 hr tablet Take 500 mg by mouth 2 (two) times daily with a meal.  5  . montelukast (SINGULAIR) 10 MG tablet Take  10 mg by mouth at bedtime.    Marland Kitchen nystatin (MYCOSTATIN/NYSTOP) powder     . omeprazole (PRILOSEC) 20 MG capsule Take 1 capsule by mouth daily.  3  . oxyCODONE-acetaminophen (PERCOCET/ROXICET) 5-325 MG tablet Take 1 tablet by mouth every 4 (four) hours.    . pravastatin (PRAVACHOL) 20 MG tablet Take 1 tablet by mouth once a week.    . pregabalin (LYRICA) 75 MG capsule Take 75 mg by mouth at bedtime.  1  . rOPINIRole (REQUIP) 2 MG tablet Take 1 tablet by mouth at bedtime.  3  . spironolactone (ALDACTONE) 50 MG tablet Take 1 tablet (50 mg total) by mouth 2 (two) times daily. 60 tablet 5  . valACYclovir (VALTREX) 1000 MG tablet Take 1,000 mg by mouth 2 (two) times daily.    . Vitamin D, Ergocalciferol, (DRISDOL) 1.25 MG (50000 UT) CAPS capsule Take 1 capsule by mouth once a week.     No current facility-administered medications for this visit.     ASSESSMENT & PLAN:   Assessment:  1. Iron deficiency, secondary to chronic GI blood loss from AVM's and recurrent epistaxis.  She has required constant iron infusions and transfusions, so we want to pursue any possible treatment for her AVM's. Hemoglobin was 7.6 at the beginning of February and so she was transfused 2 units PRBC, however, her hemoglobin dropped back down to 7.9.  Therefore, we transfused her 1 unit of PRBCs and she received 2 doses of IV iron on February 17th and 24th.  Her hemoglobin has now improved to 10.2.  2. Non-alcoholic liver cirrhosis, with intermittent mild thrombocytopenia.  Her anemia is also likely partly related to the cirrhosis.  3. Ascites, for which she is on spironolactone and furosemide.  She will increase spironolactone to 50 mg TID.  4. Hepatic encephalopathy.  She has normalization of the ammonia.  She is back on lactulose twice daily and I advised that she continue this dose  5. Recurrent epistaxis, currently stable.  She has been seen by Dr. Gaylyn Cheers who packed the area and noted an AVM there.      6. Multiple AVM's of the small bowel seen on capsule endoscopy in [place], treated at [facility] in February 2021.  I am sure she is bleeding again, but I don't know much else that can be done.  We will refer her to [facility] to see if the gastroenterologist could do further evaluation and treatment of her chronic blood loss.  She has had prior bleeding scans which did document bleeding from the small bowel.    7.  Splenic infarct.   8.  Hypokalemia.  This should improve with increasing the spironolactone.  Plan: Her hemoglobin has improved now measuring 10.2.  She knows to continue lasix 40 mg daily, lactulose twice daily and increase spironolactone 50 mg to TID.  This should improve her hypokalemia as well.   We have referred her to Dr. Cristi Loron, gastroenterologist at [facility] for further evaluation of her chronic  bleeding from AVM's, but are still waiting for appointment.  We will see her back in 2 and 1/2 weeks with CBC, CMP and serum ammonia for repeat evaluation.  She verbalizes understanding of and agreement to the plans discussed today.   I provided 15 minutes of face-to-face time during this this encounter and > 50% was spent counseling as documented under my assessment and plan.    Derwood Kaplan, MD [place]  [place]   I, Rita Ohara, am acting as scribe for Derwood Kaplan, MD  I have reviewed this report as typed by the medical scribe, and it is complete and accurate.

## 2020-10-05 ENCOUNTER — Encounter: Payer: Self-pay | Admitting: Oncology

## 2020-10-05 ENCOUNTER — Inpatient Hospital Stay: Payer: Medicare Other | Attending: Oncology

## 2020-10-05 ENCOUNTER — Other Ambulatory Visit: Payer: Self-pay

## 2020-10-05 ENCOUNTER — Other Ambulatory Visit: Payer: Self-pay | Admitting: Hematology and Oncology

## 2020-10-05 ENCOUNTER — Other Ambulatory Visit: Payer: Self-pay | Admitting: Oncology

## 2020-10-05 ENCOUNTER — Inpatient Hospital Stay (INDEPENDENT_AMBULATORY_CARE_PROVIDER_SITE_OTHER): Payer: Medicare Other | Admitting: Oncology

## 2020-10-05 VITALS — BP 149/67 | HR 89 | Temp 97.8°F | Resp 18 | Ht 63.0 in | Wt 172.6 lb

## 2020-10-05 DIAGNOSIS — K7460 Unspecified cirrhosis of liver: Secondary | ICD-10-CM | POA: Insufficient documentation

## 2020-10-05 DIAGNOSIS — D500 Iron deficiency anemia secondary to blood loss (chronic): Secondary | ICD-10-CM

## 2020-10-05 DIAGNOSIS — R5383 Other fatigue: Secondary | ICD-10-CM | POA: Insufficient documentation

## 2020-10-05 DIAGNOSIS — D509 Iron deficiency anemia, unspecified: Secondary | ICD-10-CM | POA: Diagnosis not present

## 2020-10-05 DIAGNOSIS — D649 Anemia, unspecified: Secondary | ICD-10-CM | POA: Diagnosis not present

## 2020-10-05 DIAGNOSIS — R300 Dysuria: Secondary | ICD-10-CM | POA: Diagnosis not present

## 2020-10-05 DIAGNOSIS — Z794 Long term (current) use of insulin: Secondary | ICD-10-CM | POA: Insufficient documentation

## 2020-10-05 DIAGNOSIS — R531 Weakness: Secondary | ICD-10-CM | POA: Insufficient documentation

## 2020-10-05 DIAGNOSIS — J449 Chronic obstructive pulmonary disease, unspecified: Secondary | ICD-10-CM | POA: Insufficient documentation

## 2020-10-05 DIAGNOSIS — Z79899 Other long term (current) drug therapy: Secondary | ICD-10-CM | POA: Insufficient documentation

## 2020-10-05 DIAGNOSIS — R040 Epistaxis: Secondary | ICD-10-CM | POA: Insufficient documentation

## 2020-10-05 DIAGNOSIS — D735 Infarction of spleen: Secondary | ICD-10-CM | POA: Insufficient documentation

## 2020-10-05 DIAGNOSIS — E119 Type 2 diabetes mellitus without complications: Secondary | ICD-10-CM | POA: Insufficient documentation

## 2020-10-05 DIAGNOSIS — E785 Hyperlipidemia, unspecified: Secondary | ICD-10-CM | POA: Insufficient documentation

## 2020-10-05 LAB — AMMONIA: Ammonia: 12 (ref 9–30)

## 2020-10-05 LAB — BASIC METABOLIC PANEL
BUN: 15 (ref 4–21)
CO2: 20 (ref 13–22)
Chloride: 112 — AB (ref 99–108)
Creatinine: 0.8 (ref 0.5–1.1)
Glucose: 240
Potassium: 3.3 — AB (ref 3.4–5.3)
Sodium: 140 (ref 137–147)

## 2020-10-05 LAB — CBC AND DIFFERENTIAL
HCT: 31 — AB (ref 36–46)
Hemoglobin: 10.2 — AB (ref 12.0–16.0)
Neutrophils Absolute: 3.38
Platelets: 138 — AB (ref 150–399)
WBC: 4.9

## 2020-10-05 LAB — CBC
MCV: 98 (ref 81–99)
RBC: 3.15 — AB (ref 3.87–5.11)

## 2020-10-05 LAB — HEPATIC FUNCTION PANEL
ALT: 21 (ref 7–35)
AST: 32 (ref 13–35)
Alkaline Phosphatase: 64 (ref 25–125)
Bilirubin, Total: 0.6

## 2020-10-05 LAB — COMPREHENSIVE METABOLIC PANEL
Albumin: 4.3 (ref 3.5–5.0)
Calcium: 9.5 (ref 8.7–10.7)

## 2020-10-07 ENCOUNTER — Other Ambulatory Visit: Payer: Self-pay | Admitting: Oncology

## 2020-10-07 DIAGNOSIS — N3000 Acute cystitis without hematuria: Secondary | ICD-10-CM

## 2020-10-07 MED ORDER — SULFAMETHOXAZOLE-TRIMETHOPRIM 800-160 MG PO TABS: 1.0000 | ORAL_TABLET | Freq: Two times a day (BID) | ORAL | 0 refills | Status: DC

## 2020-10-12 ENCOUNTER — Telehealth: Payer: Self-pay | Admitting: Oncology

## 2020-10-12 NOTE — Telephone Encounter (Signed)
10/12/20 Spoke with patient and scheduled appt

## 2020-10-21 NOTE — Progress Notes (Signed)
[place]  [address] [place],    [id number] [phone]  Clinic Day:  10/22/2020  Referring physician: Nicoletta Dress, MD   This document serves as a record of services personally performed by Hosie Poisson, MD. It was created on their behalf by [facility], a trained medical scribe. The creation of this record is based on the scribe's personal observations and the provider's statements to them.  CHIEF COMPLAINT:  CC: Iron deficiency anemia  Current Treatment:  Iron supplement  HISTORY OF PRESENT ILLNESS:  [name] is a 70 y.o. female who we began seeing in December 2019 for iron deficiency anemia.  She was found to have colon polyps with pathology showing tubular adenomas in November 2018.  She also had upper endoscopy in January 2018 with no specific findings.  She was clearly iron deficient.  She has multiple comorbidities, including diabetes, liver cirrhosis, COPD, hyperlipidemia, and degenerative disc disease.  Testing for any other deficiency or monoclonal spike was negative.  She had already been on oral supplement for nearly 1 year with lack of response, so was given IV iron in the form of Feraheme.  She felt somewhat better after the IV iron, but still complained of fatigue and weakness.  She was once again given IV Feraheme in June.  Her hemoglobin improved, but was still low at 11.0.  She underwent colonoscopy and EGD with biopsy in August 2020 with Dr. Noberto Retort, which was negative.  On September 1st her hemoglobin had dropped from 9.8 to 9.0 through Dr. Delena Bali.  When she was seen here on September 24th, the hemoglobin had dropped down to 7.5, so she was transfused with 2 units of packed red blood cells.  Repeat iron studies once again revealed iron deficiency.  She was given IV Feraheme again.  CT of abdomen and pelvis was negative.  She felt so much better after the transfusion that she jumped out of bed and  fractured her right foot in a fall.  She had a drop in her hemoglobin again in November and  was given IV Feraheme once more.  Capsule endoscopy in November 2020 in [facility] revealed multiple AVM's of the small bowel.    Her hemoglobin with Dr. Delena Bali was 9.6 on March 8th 2021, but decreased down to 7.6 on March 31st.  She therefore received IV Feraheme again in early April. She had cauterization of an AVM of the small bowel at [facility] in February 2021.  Abdominal ultrasound from May 20th showed cirrhosis of the liver with nodular surface, increased echogenicity and heterogeneity.  No focal liver parenchymal lesion was observed.  She has had intermittent mild thrombocytopenia, presumably from her liver cirrhosis. She received IV iron in early June, but only received one dose due to her trip to [place], she also received 2 units of packed red blood cells at that time. She presented to the [facility] emergency department on June 13th due to generalized weakness that had been ongoing for 1 week, as well as altered mental status, and was admitted.  She had just returned from a trip to [place].  While out there, she developed severe epistaxis that required packing and 1 unit of packed red blood cells.  CT imaging revealed small amount of blood in the right maxillary and sphenoid sinuses with tube placed in the right paranasal sinus.  She was found to have a UTI.  Her hemoglobin improved from 9.0 in early June to 9.7.  She developed a severe epistaxis for the 3rd time this year, requiring hospital admission in late July and had a nasal bullet in place.  Her hemoglobin dropped down to 6.9 and she has been transfused, and given a dose of IV Feraheme while in the hospital.  Her hemoglobin was still only 8.2 on July 25th, but slowly came up after that.  She had an episode of hypotension and fever and was felt to be septic and transferred to the ICU.  She was placed on broad-spectrum IV antibiotics, and cultures  remained negative.  An echocardiogram from July 27th showed a suspicious mass in the right atrium, which may be attached to the interatrial septum.  The cardiologist reviewed the echocardiogram and felt that this was likely an artifact and not a mass, vegetation or thrombus.  The patient continued to complain of pain from the compression device in her nose and described some green drainage from her sinuses, potentially the source of her fever and sepsis.  Her hemoglobin had come up to 8.7 by the time of discharge on July 27th.  She was seen on August 6th for follow up and reported weakness of her hands and and shakiness of her arms and legs.  She was occasionally dropping objects.  She also reported fatigue, dyspnea with exertion and moderate abdominal swelling.  She had a recent follow-up at Dr. Julien Nordmann office with a good cardiac report.  Due to the asterixis, an ammonia level was obtained, and was elevated at 62.  She was therefore placed on lactulose 30 mL twice a day. She was seen a week later with some improvement, but her serum ammonia was still 54, so the lactulose was increased to TID. She also had hyperkalemia, so her spironolactone 25 mg was held. We now have resumed one pill daily along with Lasix 40 mg daily due to recurrent ascites.  She was transfused with 2 units of PRBCs again at the end of September when her hemoglobin dropped down to 7.3.  Iron level is 13.0 with a TIBC of 415 for a saturation of 3.1%. She was scheduled for 2 more doses of IV Feraheme.  She was admitted on October 20th with a fever of 105 and had E coli sepsis, resistant to Ampicillin only.  I placed her lactulose on hold due to severe diarrhea, adding to her dehydration. Her serum ammonia came down to 9 we did not resume lactulose at that time.  She did have a new splenic infarction as of this admission, which was causing a lot of left abdominal pain. Her spironolactone was increased to 50 mg to take 1-2 daily.   When she was  seen two weeks ago, her CBC reveals a hemoglobin of 7.6 with normal ANC and platelets.  She was therefore transfused 2 units of PRBCs.  Iron studies today are low with saturation 5.2, TIBC 476 and ferritin 6.4. Ammonia level was elevated at 35, and so her lactulose was increased to 30 cc twice daily.  She was scheduled for IV iron, but has not received that yet.  She continues to have blood loss with black stools and one episode of moderate epistaxis, her hemoglobin was up to 9.0 in early February, but down to 7.9 when I saw her on February 14th.  We transfused her with 1 unit of PRBCs and arranged for repeat iron transfusions.  She is very frustrated by the constant recurrent bleeding which we still think is from her AVM's of the small bowel and  she would like to pursue any possible procedures to address that.    INTERVAL HISTORY:  [name] is here for routine follow up and states that she has had intermittent mild epistaxis.  This resolves quickly with packing.  She denies any other evidence of significant bleeding. She does note some dysuria, and had a urinary tract infection last month, and so we will obtain a urinalysis and urine culture today.   She does have some abdominal distention.  She continues lasix 40 mg daily, lactulose twice daily and spironolactone 50 mg BID, but I told her she could take an occasional third dose as needed.  She still has not heard anything from [facility] regarding consultation, so we will plan to contact them.  Her hemoglobin has increased from 10.2 to 11.1, and her white count and platelets are normal.  Chemistries are unremarkable including a serum ammonia of 28.0.  Her  appetite is good, and she has lost 2 pounds since her last visit.  She denies fever, chills or other signs of infection.  She denies nausea, vomiting, bowel issues, or abdominal pain.  She denies sore throat, cough, dyspnea, or chest pain.  REVIEW OF SYSTEMS:  Review of Systems  Constitutional: Positive for  fatigue (much improved). Negative for appetite change, chills, fever and unexpected weight change.  HENT:   Positive for nosebleeds (on occasion, mild).   Eyes: Negative.   Respiratory: Negative.  Negative for chest tightness, cough, hemoptysis, shortness of breath and wheezing.   Cardiovascular: Negative.  Negative for chest pain, leg swelling and palpitations.  Gastrointestinal: Positive for abdominal distention. Negative for abdominal pain, blood in stool, constipation, diarrhea, nausea and vomiting.  Endocrine: Negative.   Genitourinary: Negative.  Negative for difficulty urinating, dysuria, frequency and hematuria.   Musculoskeletal: Negative.  Negative for arthralgias, back pain, flank pain, gait problem and myalgias.  Skin: Negative.   Neurological: Negative.  Negative for dizziness, extremity weakness, gait problem, headaches, light-headedness, numbness, seizures and speech difficulty.  Hematological: Negative.   Psychiatric/Behavioral: Negative.  Negative for depression and sleep disturbance. The patient is not nervous/anxious.      VITALS:  Blood pressure (S) (!) 181/79, pulse 91, temperature 98.2 F (36.8 C), temperature source Oral, resp. rate 18, height 5\' 3"  (1.6 m), weight 170 lb 4.8 oz (77.2 kg), SpO2 97 %.  Wt Readings from Last 3 Encounters:  10/22/20 170 lb 4.8 oz (77.2 kg)  10/05/20 172 lb 9.6 oz (78.3 kg)  09/21/20 168 lb 9.6 oz (76.5 kg)    Body mass index is 30.17 kg/m.  Performance status (ECOG): 1 - Symptomatic but completely ambulatory  PHYSICAL EXAM:  Physical Exam Constitutional:      General: She is not in acute distress.    Appearance: Normal appearance. She is normal weight.  HENT:     Head: Normocephalic and atraumatic.  Eyes:     General: No scleral icterus.    Extraocular Movements: Extraocular movements intact.     Conjunctiva/sclera: Conjunctivae normal.     Pupils: Pupils are equal, round, and reactive to light.  Cardiovascular:     Rate  and Rhythm: Normal rate and regular rhythm.     Pulses: Normal pulses.     Heart sounds: Murmur heard.   Systolic murmur is present with a grade of 2/6. No friction rub. No gallop.   Pulmonary:     Effort: Pulmonary effort is normal. No respiratory distress.     Breath sounds: Normal breath sounds.  Abdominal:  General: Bowel sounds are normal. There is no distension.     Palpations: Abdomen is soft. There is no hepatomegaly, splenomegaly or mass.     Tenderness: There is no abdominal tenderness.  Musculoskeletal:        General: Normal range of motion.     Cervical back: Normal range of motion and neck supple.     Right lower leg: No edema.     Left lower leg: No edema.  Lymphadenopathy:     Cervical: No cervical adenopathy.  Skin:    General: Skin is warm and dry.  Neurological:     General: No focal deficit present.     Mental Status: She is alert and oriented to person, place, and time. Mental status is at baseline.  Psychiatric:        Mood and Affect: Mood normal.        Behavior: Behavior normal.        Thought Content: Thought content normal.        Judgment: Judgment normal.    Moderate ascites is present LABS:    CBC Latest Ref Rng & Units 10/22/2020 10/05/2020 09/21/2020  WBC - 4.3 4.9 4.5  Hemoglobin 12.0 - 16.0 11.1(A) 10.2(A) 9.2(A)  Hematocrit 36 - 46 33(A) 31(A) 28(A)  Platelets 150 - 399 143(A) 138(A) 132(A)   CMP Latest Ref Rng & Units 10/22/2020 10/05/2020 09/21/2020  Glucose 70 - 99 mg/dL - - -  BUN 4 - 21 13 15 12   Creatinine 0.5 - 1.1 0.8 0.8 0.9  Sodium 137 - 147 140 140 137  Potassium 3.4 - 5.3 3.8 3.3(A) 3.6  Chloride 99 - 108 107 112(A) 101  CO2 13 - 22 24(A) 20 26(A)  Calcium 8.7 - 10.7 10.1 9.5 11.0(A)  Total Protein 6.0 - 8.3 g/dL - - -  Total Bilirubin 0.3 - 1.2 mg/dL - - -  Alkaline Phos 25 - 125 72 64 53  AST 13 - 35 36(A) 32 23  ALT 7 - 35 22 21 33    Lab Results  Component Value Date   TIBC 476 08/26/2020   TIBC 318 05/25/2020    FERRITIN 6.4 08/26/2020   FERRITIN 205 05/25/2020   IRONPCTSAT 5.2 08/26/2020   IRONPCTSAT 25 05/25/2020    STUDIES:  No results found.   Allergies: No Known Allergies  Current Medications: Current Outpatient Medications  Medication Sig Dispense Refill  . colesevelam (WELCHOL) 625 MG tablet Take 1,875 mg by mouth 2 (two) times daily.    . furosemide (LASIX) 40 MG tablet Take 1 tablet by mouth daily.    . insulin aspart (NOVOLOG) 100 UNIT/ML FlexPen Inject 30 Units into the skin 2 (two) times daily.    Marland Kitchen lactulose (CHRONULAC) 10 GM/15ML solution Take 20 g by mouth 1 day or 1 dose.  5  . LEVEMIR FLEXTOUCH 100 UNIT/ML Pen Inject 40 Units into the skin 2 (two) times daily.  12  . levofloxacin (LEVAQUIN) 750 MG tablet Take 750 mg by mouth daily.    Marland Kitchen levothyroxine (SYNTHROID) 50 MCG tablet Take 50 mcg by mouth daily.    Marland Kitchen liraglutide (VICTOZA) 18 MG/3ML SOPN Inject 1.8 mg into the skin daily.     Marland Kitchen lisinopril (PRINIVIL,ZESTRIL) 40 MG tablet Take 1 tablet by mouth daily.  3  . metFORMIN (GLUCOPHAGE-XR) 500 MG 24 hr tablet Take 500 mg by mouth 2 (two) times daily with a meal.  5  . montelukast (SINGULAIR) 10 MG tablet Take 10 mg  by mouth at bedtime.    Marland Kitchen nystatin (MYCOSTATIN/NYSTOP) powder     . omeprazole (PRILOSEC) 20 MG capsule Take 1 capsule by mouth daily.  3  . oxyCODONE-acetaminophen (PERCOCET/ROXICET) 5-325 MG tablet Take 1 tablet by mouth every 4 (four) hours.    . pravastatin (PRAVACHOL) 20 MG tablet Take 1 tablet by mouth once a week.    . pregabalin (LYRICA) 75 MG capsule Take 75 mg by mouth at bedtime.  1  . rOPINIRole (REQUIP) 2 MG tablet Take 1 tablet by mouth at bedtime.  3  . spironolactone (ALDACTONE) 50 MG tablet Take 1 tablet (50 mg total) by mouth 2 (two) times daily. 60 tablet 5  . sulfamethoxazole-trimethoprim (BACTRIM DS) 800-160 MG tablet Take 1 tablet by mouth 2 (two) times daily. 20 tablet 0  . valACYclovir (VALTREX) 1000 MG tablet Take 1,000 mg by mouth 2  (two) times daily.    . Vitamin D, Ergocalciferol, (DRISDOL) 1.25 MG (50000 UT) CAPS capsule Take 1 capsule by mouth once a week.     No current facility-administered medications for this visit.    ASSESSMENT & PLAN:   Assessment:  1. Iron deficiency, secondary to chronic GI blood loss from AVM's and recurrent epistaxis.  She has required constant iron infusions and transfusions, so we want to pursue any possible treatment for her AVM's. Hemoglobin was 7.6 at the beginning of February and so she was transfused 2 units PRBC, however, her hemoglobin dropped back down to 7.9.  Therefore, we transfused her 1 unit of PRBCs and she received 2 doses of IV iron on February 17th and 24th.  Her hemoglobin has now improved to 17.4.  2. Non-alcoholic liver cirrhosis, with intermittent mild thrombocytopenia.  Her anemia is also likely partly related to the cirrhosis.  3. Ascites, for which she is on spironolactone and furosemide.  She will continue spironolactone 50 mg BID.  4. Hepatic encephalopathy.  She has normalization of the ammonia, but it has been slowly increasing.  She is back on lactulose twice daily and I advised that she continue this dose.    5. Recurrent epistaxis, currently stable.  She has been seen by Dr. Gaylyn Cheers who packed the area and noted an AVM there.      6. Multiple AVM's of the small bowel seen on capsule endoscopy in [place], treated at [facility] in February 2021.  I am sure she is bleeding again, but I don't know much else that can be done.  We will refer her to [facility] to see if the gastroenterologist could do further evaluation and treatment of her chronic blood loss.  She has had prior bleeding scans which did document bleeding from the small bowel.    7.  Splenic infarct.   8.  Dysuria.  We will obtain a UA and culture today.    Plan: Her hemoglobin has continued to improve now measuring 11.1.  As she is having dysuria, we will  collect a specimen for urinalysis and culture today.  I will call her with the results.  She knows to continue lasix 40 mg daily, lactulose twice daily and spironolactone 50 mg BID.  We have referred her to Dr. Cristi Loron, gastroenterologist at [facility] for further evaluation of her chronic bleeding from AVM's.  She still has not heard back from [facility] regarding an appointment and so we will plan to contact them again.  We will see her back in 3 weeks with CBC, CMP  and serum ammonia for repeat evaluation.  She verbalizes understanding of and agreement to the plans discussed today.   I provided 15 minutes of face-to-face time during this this encounter and > 50% was spent counseling as documented under my assessment and plan.    Derwood Kaplan, MD [facility] [place]   I, Rita Ohara, am acting as scribe for Derwood Kaplan, MD  I have reviewed this report as typed by the medical scribe, and it is complete and accurate.

## 2020-10-22 ENCOUNTER — Other Ambulatory Visit: Payer: Self-pay | Admitting: Hematology and Oncology

## 2020-10-22 ENCOUNTER — Inpatient Hospital Stay: Payer: Medicare Other

## 2020-10-22 ENCOUNTER — Encounter: Payer: Self-pay | Admitting: Oncology

## 2020-10-22 ENCOUNTER — Other Ambulatory Visit: Payer: Self-pay

## 2020-10-22 ENCOUNTER — Other Ambulatory Visit: Payer: Self-pay | Admitting: Oncology

## 2020-10-22 ENCOUNTER — Inpatient Hospital Stay (INDEPENDENT_AMBULATORY_CARE_PROVIDER_SITE_OTHER): Payer: Medicare Other | Admitting: Oncology

## 2020-10-22 VITALS — BP 181/79 | HR 91 | Temp 98.2°F | Resp 18 | Ht 63.0 in | Wt 170.3 lb

## 2020-10-22 DIAGNOSIS — R300 Dysuria: Secondary | ICD-10-CM

## 2020-10-22 DIAGNOSIS — R531 Weakness: Secondary | ICD-10-CM | POA: Diagnosis not present

## 2020-10-22 DIAGNOSIS — D500 Iron deficiency anemia secondary to blood loss (chronic): Secondary | ICD-10-CM | POA: Diagnosis not present

## 2020-10-22 DIAGNOSIS — K7460 Unspecified cirrhosis of liver: Secondary | ICD-10-CM

## 2020-10-22 DIAGNOSIS — E785 Hyperlipidemia, unspecified: Secondary | ICD-10-CM | POA: Diagnosis not present

## 2020-10-22 DIAGNOSIS — R040 Epistaxis: Secondary | ICD-10-CM | POA: Diagnosis not present

## 2020-10-22 DIAGNOSIS — D735 Infarction of spleen: Secondary | ICD-10-CM | POA: Diagnosis not present

## 2020-10-22 DIAGNOSIS — J449 Chronic obstructive pulmonary disease, unspecified: Secondary | ICD-10-CM | POA: Diagnosis not present

## 2020-10-22 DIAGNOSIS — R5383 Other fatigue: Secondary | ICD-10-CM | POA: Diagnosis not present

## 2020-10-22 DIAGNOSIS — D509 Iron deficiency anemia, unspecified: Secondary | ICD-10-CM | POA: Diagnosis not present

## 2020-10-22 DIAGNOSIS — K7581 Nonalcoholic steatohepatitis (NASH): Secondary | ICD-10-CM

## 2020-10-22 DIAGNOSIS — D649 Anemia, unspecified: Secondary | ICD-10-CM | POA: Diagnosis not present

## 2020-10-22 DIAGNOSIS — E119 Type 2 diabetes mellitus without complications: Secondary | ICD-10-CM | POA: Diagnosis not present

## 2020-10-22 DIAGNOSIS — Z79899 Other long term (current) drug therapy: Secondary | ICD-10-CM | POA: Diagnosis not present

## 2020-10-22 DIAGNOSIS — Z794 Long term (current) use of insulin: Secondary | ICD-10-CM | POA: Diagnosis not present

## 2020-10-22 LAB — CBC AND DIFFERENTIAL
HCT: 33 — AB (ref 36–46)
Hemoglobin: 11.1 — AB (ref 12.0–16.0)
Neutrophils Absolute: 3.01
Platelets: 143 — AB (ref 150–399)
WBC: 4.3

## 2020-10-22 LAB — URINALYSIS, COMPLETE (UACMP) WITH MICROSCOPIC
Bacteria, UA: NONE SEEN
Bilirubin Urine: NEGATIVE
Glucose, UA: 50 mg/dL — AB
Hgb urine dipstick: NEGATIVE
Ketones, ur: NEGATIVE mg/dL
Leukocytes,Ua: NEGATIVE
Nitrite: NEGATIVE
Protein, ur: NEGATIVE mg/dL
Specific Gravity, Urine: 1.009 (ref 1.005–1.030)
pH: 5 (ref 5.0–8.0)

## 2020-10-22 LAB — BASIC METABOLIC PANEL
BUN: 13 (ref 4–21)
CO2: 24 — AB (ref 13–22)
Chloride: 107 (ref 99–108)
Creatinine: 0.8 (ref 0.5–1.1)
Glucose: 188
Potassium: 3.8 (ref 3.4–5.3)
Sodium: 140 (ref 137–147)

## 2020-10-22 LAB — HEPATIC FUNCTION PANEL
ALT: 22 (ref 7–35)
AST: 36 — AB (ref 13–35)
Alkaline Phosphatase: 72 (ref 25–125)
Bilirubin, Total: 0.7

## 2020-10-22 LAB — FERRITIN: Ferritin: 38 ng/mL (ref 11–307)

## 2020-10-22 LAB — COMPREHENSIVE METABOLIC PANEL
Albumin: 4.5 (ref 3.5–5.0)
Calcium: 10.1 (ref 8.7–10.7)

## 2020-10-22 LAB — IRON AND TIBC
Iron: 82 ug/dL (ref 28–170)
Saturation Ratios: 17 % (ref 10.4–31.8)
TIBC: 483 ug/dL — ABNORMAL HIGH (ref 250–450)
UIBC: 401 ug/dL

## 2020-10-22 LAB — CBC: RBC: 3.49 — AB (ref 3.87–5.11)

## 2020-10-23 LAB — SOLUBLE TRANSFERRIN RECEPTOR: Transferrin Receptor: 23.9 nmol/L (ref 12.2–27.3)

## 2020-10-24 ENCOUNTER — Other Ambulatory Visit: Payer: Self-pay | Admitting: Oncology

## 2020-10-24 DIAGNOSIS — R300 Dysuria: Secondary | ICD-10-CM

## 2020-10-24 LAB — URINE CULTURE

## 2020-10-26 ENCOUNTER — Telehealth: Payer: Self-pay

## 2020-10-26 NOTE — Telephone Encounter (Signed)
-----   Message from Derwood Kaplan, MD sent at 10/22/2020  5:16 PM EDT ----- Regarding: referral She still hasn't heard from [place] about that referral for Dr. Cristi Loron regarding recurrent bleeding from AVM's requiring regular transfusions and IV iron.  Pls contact them again

## 2020-10-26 NOTE — Telephone Encounter (Signed)
-----   Message from Derwood Kaplan, MD sent at 10/24/2020  6:09 PM EDT ----- Regarding: call pt Tell her mult bacteria seen, rec. Recollect urine culture

## 2020-10-27 ENCOUNTER — Telehealth: Payer: Self-pay

## 2020-10-27 ENCOUNTER — Inpatient Hospital Stay: Payer: Medicare Other | Attending: Oncology

## 2020-10-27 ENCOUNTER — Other Ambulatory Visit: Payer: Self-pay

## 2020-10-27 DIAGNOSIS — Q2739 Arteriovenous malformation, other site: Secondary | ICD-10-CM | POA: Diagnosis not present

## 2020-10-27 DIAGNOSIS — J449 Chronic obstructive pulmonary disease, unspecified: Secondary | ICD-10-CM | POA: Insufficient documentation

## 2020-10-27 DIAGNOSIS — R5381 Other malaise: Secondary | ICD-10-CM | POA: Diagnosis not present

## 2020-10-27 DIAGNOSIS — D500 Iron deficiency anemia secondary to blood loss (chronic): Secondary | ICD-10-CM | POA: Diagnosis not present

## 2020-10-27 DIAGNOSIS — Z79899 Other long term (current) drug therapy: Secondary | ICD-10-CM | POA: Diagnosis not present

## 2020-10-27 DIAGNOSIS — R300 Dysuria: Secondary | ICD-10-CM | POA: Insufficient documentation

## 2020-10-27 DIAGNOSIS — R188 Other ascites: Secondary | ICD-10-CM | POA: Insufficient documentation

## 2020-10-27 DIAGNOSIS — E860 Dehydration: Secondary | ICD-10-CM | POA: Diagnosis not present

## 2020-10-27 DIAGNOSIS — E785 Hyperlipidemia, unspecified: Secondary | ICD-10-CM | POA: Insufficient documentation

## 2020-10-27 DIAGNOSIS — R040 Epistaxis: Secondary | ICD-10-CM | POA: Insufficient documentation

## 2020-10-27 DIAGNOSIS — E119 Type 2 diabetes mellitus without complications: Secondary | ICD-10-CM | POA: Insufficient documentation

## 2020-10-27 DIAGNOSIS — D696 Thrombocytopenia, unspecified: Secondary | ICD-10-CM | POA: Insufficient documentation

## 2020-10-27 DIAGNOSIS — Z794 Long term (current) use of insulin: Secondary | ICD-10-CM | POA: Diagnosis not present

## 2020-10-27 DIAGNOSIS — K7290 Hepatic failure, unspecified without coma: Secondary | ICD-10-CM | POA: Diagnosis not present

## 2020-10-27 DIAGNOSIS — K7460 Unspecified cirrhosis of liver: Secondary | ICD-10-CM | POA: Insufficient documentation

## 2020-10-27 NOTE — Telephone Encounter (Signed)
-----   Message from Derwood Kaplan, MD sent at 10/24/2020  6:09 PM EDT ----- Regarding: call pt Tell her mult bacteria seen, rec. Recollect urine culture

## 2020-10-27 NOTE — Telephone Encounter (Signed)
Patient returned my call. Informed patient to come back to clinic for give another urine specimen due to the prior one having multi-bacteria in the culture. Patient will be back sometime today. Shay in lab notified

## 2020-10-27 NOTE — Telephone Encounter (Signed)
[facility] regarding referral to them. They see the referral on the Endoscopy schedule. Was on hold for 18 minutes, no answer. Will try back again later.

## 2020-10-27 NOTE — Telephone Encounter (Signed)
-----   Message from Derwood Kaplan, MD sent at 10/22/2020  5:16 PM EDT ----- Regarding: referral She still hasn't heard from [place] about that referral for Dr. Cristi Loron regarding recurrent bleeding from AVM's requiring regular transfusions and IV iron.  Pls contact them again

## 2020-10-28 ENCOUNTER — Telehealth: Payer: Self-pay

## 2020-10-28 NOTE — Telephone Encounter (Signed)
-----   Message from Derwood Kaplan, MD sent at 10/22/2020  5:16 PM EDT ----- Regarding: referral She still hasn't heard from [place] about that referral for Dr. Cristi Loron regarding recurrent bleeding from AVM's requiring regular transfusions and IV iron.  Pls contact them again

## 2020-10-28 NOTE — Telephone Encounter (Signed)
Called [facility] then gave me a number for the [place] at [phone] to check on patient's referral appointment. Referral has been pending. Needs endoscopy reports to finish referral. Fax to [phone].

## 2020-10-30 ENCOUNTER — Telehealth: Payer: Self-pay

## 2020-10-30 ENCOUNTER — Other Ambulatory Visit: Payer: Self-pay | Admitting: Oncology

## 2020-10-30 DIAGNOSIS — E860 Dehydration: Secondary | ICD-10-CM

## 2020-10-30 DIAGNOSIS — R110 Nausea: Secondary | ICD-10-CM

## 2020-10-30 DIAGNOSIS — N3001 Acute cystitis with hematuria: Secondary | ICD-10-CM

## 2020-10-30 LAB — URINE CULTURE: Culture: >=100000 — AB

## 2020-10-30 MED ORDER — SULFAMETHOXAZOLE-TRIMETHOPRIM 800-160 MG PO TABS: 1.0000 | ORAL_TABLET | Freq: Two times a day (BID) | ORAL | 1 refills | Status: DC

## 2020-10-30 NOTE — Telephone Encounter (Addendum)
RE: Urine culture results Received: Today Derwood Kaplan, MD  Dairl Ponder, RN Phone Number: [phone]   Sensitivities show resistance to Ampicillin and Macobid so will go with Bactrim. I will check if allergic        Pt notified of below.  ----- Message from Derwood Kaplan, MD sent at 10/30/2020 11:04 AM EDT ----- Regarding: RE: Urine culture results Contact: [phone] She does have UTI, Levada Dy and I are watching for the sensitivities so can prescribe correct ab. I just checked, still not ready. ----- Message ----- From: Dairl Ponder, RN Sent: 10/30/2020   8:29 AM EDT To: Derwood Kaplan, MD Subject: Urine culture results                          Pt calling to get latest urine culture results. Please advise.

## 2020-11-12 ENCOUNTER — Other Ambulatory Visit: Payer: Self-pay | Admitting: Hematology and Oncology

## 2020-11-12 ENCOUNTER — Encounter: Payer: Self-pay | Admitting: Hematology and Oncology

## 2020-11-12 ENCOUNTER — Inpatient Hospital Stay (INDEPENDENT_AMBULATORY_CARE_PROVIDER_SITE_OTHER): Payer: Medicare Other | Admitting: Hematology and Oncology

## 2020-11-12 ENCOUNTER — Inpatient Hospital Stay: Payer: Medicare Other

## 2020-11-12 ENCOUNTER — Other Ambulatory Visit: Payer: Self-pay

## 2020-11-12 ENCOUNTER — Inpatient Hospital Stay: Payer: Medicare Other | Admitting: Hematology and Oncology

## 2020-11-12 VITALS — BP 122/64 | HR 82 | Temp 98.0°F | Resp 18 | Ht 63.0 in | Wt 167.0 lb

## 2020-11-12 VITALS — BP 182/76 | HR 88 | Temp 98.0°F | Resp 18 | Ht 63.0 in | Wt 167.6 lb

## 2020-11-12 DIAGNOSIS — E860 Dehydration: Secondary | ICD-10-CM

## 2020-11-12 DIAGNOSIS — R110 Nausea: Secondary | ICD-10-CM | POA: Insufficient documentation

## 2020-11-12 DIAGNOSIS — D509 Iron deficiency anemia, unspecified: Secondary | ICD-10-CM | POA: Diagnosis not present

## 2020-11-12 DIAGNOSIS — D696 Thrombocytopenia, unspecified: Secondary | ICD-10-CM | POA: Diagnosis not present

## 2020-11-12 DIAGNOSIS — K7460 Unspecified cirrhosis of liver: Secondary | ICD-10-CM

## 2020-11-12 DIAGNOSIS — N3001 Acute cystitis with hematuria: Secondary | ICD-10-CM

## 2020-11-12 DIAGNOSIS — D649 Anemia, unspecified: Secondary | ICD-10-CM | POA: Diagnosis not present

## 2020-11-12 DIAGNOSIS — R5381 Other malaise: Secondary | ICD-10-CM | POA: Diagnosis not present

## 2020-11-12 DIAGNOSIS — Q2739 Arteriovenous malformation, other site: Secondary | ICD-10-CM | POA: Diagnosis not present

## 2020-11-12 DIAGNOSIS — D500 Iron deficiency anemia secondary to blood loss (chronic): Secondary | ICD-10-CM | POA: Diagnosis not present

## 2020-11-12 DIAGNOSIS — R040 Epistaxis: Secondary | ICD-10-CM | POA: Diagnosis not present

## 2020-11-12 LAB — BASIC METABOLIC PANEL
BUN: 18 (ref 4–21)
CO2: 21 (ref 13–22)
Chloride: 107 (ref 99–108)
Creatinine: 1 (ref 0.5–1.1)
Glucose: 160
Potassium: 4.9 (ref 3.4–5.3)
Sodium: 136 — AB (ref 137–147)

## 2020-11-12 LAB — HEPATIC FUNCTION PANEL
ALT: 26 (ref 7–35)
AST: 50 — AB (ref 13–35)
Alkaline Phosphatase: 71 (ref 25–125)
Bilirubin, Total: 0.8

## 2020-11-12 LAB — CBC AND DIFFERENTIAL
HCT: 32 — AB (ref 36–46)
Hemoglobin: 10.6 — AB (ref 12.0–16.0)
Neutrophils Absolute: 4
Platelets: 152 (ref 150–399)
WBC: 5.8

## 2020-11-12 LAB — COMPREHENSIVE METABOLIC PANEL
Albumin: 4.4 (ref 3.5–5.0)
Calcium: 9.5 (ref 8.7–10.7)

## 2020-11-12 LAB — CBC: RBC: 3.25 — AB (ref 3.87–5.11)

## 2020-11-12 MED ORDER — ONDANSETRON HCL 4 MG/2ML IJ SOLN
INTRAMUSCULAR | Status: AC
  Filled 2020-11-12: qty 4

## 2020-11-12 MED ORDER — ONDANSETRON HCL 4 MG PO TABS: 4.0000 mg | ORAL_TABLET | ORAL | 3 refills | Status: DC | PRN

## 2020-11-12 MED ORDER — HEPARIN SOD (PORK) LOCK FLUSH 100 UNIT/ML IV SOLN
500.0000 [IU] | Freq: Once | INTRAVENOUS | Status: DC | PRN
  Filled 2020-11-12: qty 5

## 2020-11-12 MED ORDER — NITROFURANTOIN MONOHYD MACRO 100 MG PO CAPS: 100.0000 mg | ORAL_CAPSULE | Freq: Two times a day (BID) | ORAL | 0 refills | Status: DC

## 2020-11-12 MED ORDER — ONDANSETRON HCL 4 MG/2ML IJ SOLN
8.0000 mg | Freq: Once | INTRAMUSCULAR | Status: AC
Start: 2020-11-12 — End: 2020-11-12
  Administered 2020-11-12: 8 mg via INTRAVENOUS

## 2020-11-12 MED ORDER — SODIUM CHLORIDE 0.9 % IV SOLN
Freq: Once | INTRAVENOUS | Status: AC
  Filled 2020-11-12: qty 250

## 2020-11-12 NOTE — Progress Notes (Signed)
misc

## 2020-11-12 NOTE — Progress Notes (Signed)
[facility]  [address] [place],  [place]  [address] [phone]  Clinic Day:  11/12/2020  Referring physician: Nicoletta Dress, MD    CHIEF COMPLAINT:  CC: A 70 year old female with history of iron deficiency anemia here for 3 week evaluation.  Current Treatment:  Iron supplement  HISTORY OF PRESENT ILLNESS:  Raksha Wolfgang is a 70 y.o. female who we began seeing in December 2019 for iron deficiency anemia.  She was found to have colon polyps with pathology showing tubular adenomas in November 2018.  She also had upper endoscopy in January 2018 with no specific findings.  She was clearly iron deficient.  She has multiple comorbidities, including diabetes, liver cirrhosis, COPD, hyperlipidemia, and degenerative disc disease.  Testing for any other deficiency or monoclonal spike was negative.  She had already been on oral supplement for nearly 1 year with lack of response, so was given IV iron in the form of Feraheme.  She felt somewhat better after the IV iron, but still complained of fatigue and weakness.  She was once again given IV Feraheme in June.  Her hemoglobin improved, but was still low at 11.0.  She underwent colonoscopy and EGD with biopsy in August 2020 with Dr. Noberto Retort, which was negative.  On September 1st her hemoglobin had dropped from 9.8 to 9.0 through Dr. Delena Bali.  When she was seen here on September 24th, the hemoglobin had dropped down to 7.5, so she was transfused with 2 units of packed red blood cells.  Repeat iron studies once again revealed iron deficiency.  She was given IV Feraheme again.  CT of abdomen and pelvis was negative.  She felt so much better after the transfusion that she jumped out of bed and fractured her right foot in a fall.  She had a drop in her hemoglobin again in November and  was given IV Feraheme once more.  Capsule endoscopy in November 2020 in [facility] revealed multiple AVM's of the small bowel.     Her hemoglobin with Dr. Delena Bali was 9.6 on March 8th 2021, but decreased down to 7.6 on March 31st.  She therefore received IV Feraheme again in early April. She had cauterization of an AVM of the small bowel at [facility] in February 2021.  Abdominal ultrasound from May 20th showed cirrhosis of the liver with nodular surface, increased echogenicity and heterogeneity.  No focal liver parenchymal lesion was observed.  She has had intermittent mild thrombocytopenia, presumably from her liver cirrhosis. She received IV iron in early June, but only received one dose due to her trip to [place], she also received 2 units of packed red blood cells at that time. She presented to the [facility] emergency department on June 13th due to generalized weakness that had been ongoing for 1 week, as well as altered mental status, and was admitted.  She had just returned from a trip to [place].  While out there, she developed severe epistaxis that required packing and 1 unit of packed red blood cells.  CT imaging revealed small amount of blood in the right maxillary and sphenoid sinuses with tube placed in the right paranasal sinus.  She was found to have a UTI.  Her hemoglobin improved from 9.0 in early June to 9.7.   She developed a severe epistaxis for the 3rd time this year, requiring hospital admission in late July and had a nasal bullet in place.  Her hemoglobin dropped down to 6.9  and she has been transfused, and given a dose of IV Feraheme while in the hospital.  Her hemoglobin was still only 8.2 on July 25th, but slowly came up after that.  She had an episode of hypotension and fever and was felt to be septic and transferred to the ICU.  She was placed on broad-spectrum IV antibiotics, and cultures remained negative.  An echocardiogram from July 27th showed a suspicious mass in the right atrium, which may be attached to the interatrial septum.  The cardiologist reviewed the echocardiogram and felt that this was likely an  artifact and not a mass, vegetation or thrombus.  The patient continued to complain of pain from the compression device in her nose and described some green drainage from her sinuses, potentially the source of her fever and sepsis.  Her hemoglobin had come up to 8.7 by the time of discharge on July 27th.  She was seen on August 6th for follow up and reported weakness of her hands and and shakiness of her arms and legs.  She was occasionally dropping objects.  She also reported fatigue, dyspnea with exertion and moderate abdominal swelling.  She had a recent follow-up at Dr. Julien Nordmann office with a good cardiac report.  Due to the asterixis, an ammonia level was obtained, and was elevated at 62.  She was therefore placed on lactulose 30 mL twice a day. She was seen a week later with some improvement, but her serum ammonia was still 54, so the lactulose was increased to TID. She also had hyperkalemia, so her spironolactone 25 mg was held. We now have resumed one pill daily along with Lasix 40 mg daily due to recurrent ascites.  She was transfused with 2 units of PRBCs again at the end of September when her hemoglobin dropped down to 7.3.  Iron level is 13.0 with a TIBC of 415 for a saturation of 3.1%. She was scheduled for 2 more doses of IV Feraheme.  She was admitted on October 20th with a fever of 105 and had E coli sepsis, resistant to Ampicillin only.  I placed her lactulose on hold due to severe diarrhea, adding to her dehydration. Her serum ammonia came down to 9 we did not resume lactulose at that time.  She did have a new splenic infarction as of this admission, which was causing a lot of left abdominal pain. Her spironolactone was increased to 50 mg to take 1-2 daily.   When she was seen two weeks ago, her CBC reveals a hemoglobin of 7.6 with normal ANC and platelets.  She was therefore transfused 2 units of PRBCs.  Iron studies today are low with saturation 5.2, TIBC 476 and ferritin 6.4. Ammonia level was  elevated at 35, and so her lactulose was increased to 30 cc twice daily.  She was scheduled for IV iron, but has not received that yet.  She continues to have blood loss with black stools and one episode of moderate epistaxis, her hemoglobin was up to 9.0 in early February, but down to 7.9 when I saw her on February 14th.  We transfused her with 1 unit of PRBCs and arranged for repeat iron transfusions.  She is very frustrated by the constant recurrent bleeding which we still think is from her AVM's of the small bowel and she would like to pursue any possible procedures to address that.    INTERVAL HISTORY:  Noele is here for evaluation of iron deficiency anemia. She has been well since  last visit until a couple of days ago when she had a few episodes of nausea and is now feeling fatigued. She ran a low grade temperature and continues to have burning and urinary frequency. She also complains of lower abdominal pain, similar to when she experiences a UTI. She is scheduled to see the gastroenterologist at [facility] on May 27th. She denies any bleeding in her stool. She denies shortness of breath, chest pain or cough. CBC today reveals a decreasing hemoglobin 10.6 from 11.1. CMP is unremarkable.  REVIEW OF SYSTEMS:  Review of Systems  Constitutional: Positive for fatigue. Negative for appetite change, chills, diaphoresis, fever and unexpected weight change.  HENT:   Negative for hearing loss, lump/mass, mouth sores, nosebleeds, sore throat, tinnitus, trouble swallowing and voice change.   Eyes: Negative for eye problems and icterus.  Respiratory: Negative for chest tightness, cough, hemoptysis, shortness of breath and wheezing.   Cardiovascular: Negative for chest pain, leg swelling and palpitations.  Gastrointestinal: Positive for abdominal pain. Negative for abdominal distention, blood in stool, constipation, diarrhea, nausea, rectal pain and vomiting.  Endocrine: Negative for hot flashes.  Genitourinary:  Positive for dysuria and frequency. Negative for bladder incontinence, difficulty urinating, dyspareunia, hematuria and nocturia.   Musculoskeletal: Positive for back pain. Negative for arthralgias, flank pain, gait problem, myalgias, neck pain and neck stiffness.  Skin: Negative for itching, rash and wound.  Neurological: Negative for dizziness, extremity weakness, gait problem, headaches, light-headedness, numbness, seizures and speech difficulty.  Hematological: Negative for adenopathy. Does not bruise/bleed easily.  Psychiatric/Behavioral: Negative for confusion, decreased concentration, depression, sleep disturbance and suicidal ideas. The patient is not nervous/anxious.      VITALS:  Blood pressure (!) 182/76, pulse 88, temperature 98 F (36.7 C), temperature source Oral, resp. rate 18, height 5\' 3"  (1.6 m), weight 167 lb 9.6 oz (76 kg), SpO2 97 %.  Wt Readings from Last 3 Encounters:  11/12/20 167 lb (75.8 kg)  11/12/20 167 lb 9.6 oz (76 kg)  10/22/20 170 lb 4.8 oz (77.2 kg)    Body mass index is 29.69 kg/m.  Performance status (ECOG): 1 - Symptomatic but completely ambulatory  PHYSICAL EXAM:  Physical Exam Constitutional:      General: She is not in acute distress.    Appearance: Normal appearance. She is normal weight. She is not ill-appearing, toxic-appearing or diaphoretic.  HENT:     Head: Normocephalic and atraumatic.     Nose: Nose normal. No congestion or rhinorrhea.     Mouth/Throat:     Mouth: Mucous membranes are moist.     Pharynx: Oropharynx is clear. No oropharyngeal exudate or posterior oropharyngeal erythema.  Eyes:     General: No scleral icterus.       Right eye: No discharge.        Left eye: No discharge.     Extraocular Movements: Extraocular movements intact.     Conjunctiva/sclera: Conjunctivae normal.     Pupils: Pupils are equal, round, and reactive to light.  Neck:     Vascular: No carotid bruit.  Cardiovascular:     Rate and Rhythm: Normal  rate and regular rhythm.     Heart sounds: No murmur heard. No friction rub. No gallop.   Pulmonary:     Effort: Pulmonary effort is normal. No respiratory distress.     Breath sounds: Normal breath sounds. No stridor. No wheezing, rhonchi or rales.  Chest:     Chest wall: No tenderness.  Abdominal:  General: Abdomen is flat. Bowel sounds are normal. There is no distension.     Palpations: There is no mass.     Tenderness: There is no abdominal tenderness. There is no right CVA tenderness, left CVA tenderness, guarding or rebound.     Hernia: No hernia is present.  Musculoskeletal:        General: No swelling, tenderness, deformity or signs of injury. Normal range of motion.     Cervical back: Normal range of motion and neck supple. No rigidity or tenderness.     Right lower leg: No edema.     Left lower leg: No edema.  Lymphadenopathy:     Cervical: No cervical adenopathy.  Skin:    General: Skin is warm and dry.     Capillary Refill: Capillary refill takes less than 2 seconds.     Coloration: Skin is not jaundiced or pale.     Findings: No bruising, erythema, lesion or rash.  Neurological:     General: No focal deficit present.     Mental Status: She is alert and oriented to person, place, and time. Mental status is at baseline.     Cranial Nerves: No cranial nerve deficit.     Sensory: No sensory deficit.     Motor: No weakness.     Coordination: Coordination normal.     Gait: Gait normal.     Deep Tendon Reflexes: Reflexes normal.  Psychiatric:        Mood and Affect: Mood normal.        Behavior: Behavior normal.        Thought Content: Thought content normal.        Judgment: Judgment normal.    Moderate ascites is present LABS:    CBC Latest Ref Rng & Units 11/12/2020 10/22/2020 10/05/2020  WBC - 5.8 4.3 4.9  Hemoglobin 12.0 - 16.0 10.6(A) 11.1(A) 10.2(A)  Hematocrit 36 - 46 32(A) 33(A) 31(A)  Platelets 150 - 399 152 143(A) 138(A)   CMP Latest Ref Rng & Units  11/12/2020 10/22/2020 10/05/2020  Glucose 70 - 99 mg/dL - - -  BUN 4 - 21 18 13 15   Creatinine 0.5 - 1.1 1.0 0.8 0.8  Sodium 137 - 147 136(A) 140 140  Potassium 3.4 - 5.3 4.9 3.8 3.3(A)  Chloride 99 - 108 107 107 112(A)  CO2 13 - 22 21 24(A) 20  Calcium 8.7 - 10.7 9.5 10.1 9.5  Total Protein 6.0 - 8.3 g/dL - - -  Total Bilirubin 0.3 - 1.2 mg/dL - - -  Alkaline Phos 25 - 125 71 72 64  AST 13 - 35 50(A) 36(A) 32  ALT 7 - 35 26 22 21     Lab Results  Component Value Date   TIBC 483 (H) 10/22/2020   TIBC 476 08/26/2020   TIBC 318 05/25/2020   FERRITIN 38 10/22/2020   FERRITIN 6.4 08/26/2020   FERRITIN 205 05/25/2020   IRONPCTSAT 17 10/22/2020   IRONPCTSAT 5.2 08/26/2020   IRONPCTSAT 25 05/25/2020    STUDIES:  No results found.   Allergies: No Known Allergies  Current Medications: Current Outpatient Medications  Medication Sig Dispense Refill  . nitrofurantoin, macrocrystal-monohydrate, (MACROBID) 100 MG capsule Take 1 capsule (100 mg total) by mouth 2 (two) times daily. 14 capsule 0  . ondansetron (ZOFRAN) 4 MG tablet Take 1 tablet (4 mg total) by mouth every 4 (four) hours as needed for nausea. 90 tablet 3  . colesevelam (WELCHOL) 625 MG tablet Take  1,875 mg by mouth 2 (two) times daily.    . furosemide (LASIX) 40 MG tablet Take 1 tablet by mouth daily.    . insulin aspart (NOVOLOG) 100 UNIT/ML FlexPen Inject 30 Units into the skin 2 (two) times daily.    Marland Kitchen lactulose (CHRONULAC) 10 GM/15ML solution Take 20 g by mouth 1 day or 1 dose.  5  . LEVEMIR FLEXTOUCH 100 UNIT/ML Pen Inject 40 Units into the skin 2 (two) times daily.  12  . levothyroxine (SYNTHROID) 50 MCG tablet Take 50 mcg by mouth daily.    Marland Kitchen liraglutide (VICTOZA) 18 MG/3ML SOPN Inject 1.8 mg into the skin daily.     Marland Kitchen lisinopril (PRINIVIL,ZESTRIL) 40 MG tablet Take 1 tablet by mouth daily.  3  . metFORMIN (GLUCOPHAGE-XR) 500 MG 24 hr tablet Take 500 mg by mouth 2 (two) times daily with a meal.  5  . montelukast  (SINGULAIR) 10 MG tablet Take 10 mg by mouth at bedtime.    Marland Kitchen nystatin (MYCOSTATIN/NYSTOP) powder     . omeprazole (PRILOSEC) 20 MG capsule Take 1 capsule by mouth daily.  3  . oxyCODONE-acetaminophen (PERCOCET/ROXICET) 5-325 MG tablet Take 1 tablet by mouth every 4 (four) hours.    . pravastatin (PRAVACHOL) 20 MG tablet Take 1 tablet by mouth once a week.    . pregabalin (LYRICA) 75 MG capsule Take 75 mg by mouth at bedtime.  1  . rOPINIRole (REQUIP) 2 MG tablet Take 1 tablet by mouth at bedtime.  3  . spironolactone (ALDACTONE) 50 MG tablet Take 1 tablet (50 mg total) by mouth 2 (two) times daily. 60 tablet 5  . sulfamethoxazole-trimethoprim (BACTRIM DS) 800-160 MG tablet Take 1 tablet by mouth 2 (two) times daily. 20 tablet 1  . valACYclovir (VALTREX) 1000 MG tablet Take 1,000 mg by mouth 2 (two) times daily.    . Vitamin D, Ergocalciferol, (DRISDOL) 1.25 MG (50000 UT) CAPS capsule Take 1 capsule by mouth once a week.     No current facility-administered medications for this visit.   Facility-Administered Medications Ordered in Other Visits  Medication Dose Route Frequency Provider Last Rate Last Admin  . heparin lock flush 100 unit/mL  500 Units Intracatheter Once PRN Derwood Kaplan, MD        ASSESSMENT & PLAN:   Assessment:  1. Iron deficiency, secondary to chronic GI blood loss from AVM's and recurrent epistaxis.  She has required constant iron infusions and transfusions, so we want to pursue any possible treatment for her AVM's. Hemoglobin was 7.6 at the beginning of February and so she was transfused 2 units PRBC, however, her hemoglobin dropped back down to 7.9.  Therefore, we transfused her 1 unit of PRBCs and she received 2 doses of IV iron on February 17th and 24th.  Her hemoglobin is again decreased to 10.1 without any bleeding noted.   2. Non-alcoholic liver cirrhosis, with intermittent mild thrombocytopenia.  Her anemia is also likely partly related to the  cirrhosis.  3. Ascites, for which she is on spironolactone and furosemide.  She will continue spironolactone 50 mg BID.  4. Hepatic encephalopathy.  She has normalization of the ammonia, but it has been slowly increasing.  She is back on lactulose twice daily and I advised that she continue this dose.  Her ammonia level today is normal.    5. Multiple AVM's of the small bowel seen on capsule endoscopy in [facility], treated at [facility] in February 2021.  She is scheduled to see Dr. Malissa Hippo at [facility] on May 27th.  6.  Dysuria.  We will obtain a UA and culture today.    Plan: She would like to receive IVF today as she has been unable to drink due to nausea. I will arrange for her to get 1 liter of fluids today with Zofran 8 mg IV. I will also send in a prescription for zofran to have at home. We will recheck her urine again today and I will send in [place]. As her hemoglobin is starting to decrease, I will see her back in 2 weeks for repeat labs.      Melodye Ped, NP [facility] [place]

## 2020-11-12 NOTE — Patient Instructions (Signed)
Dehydration, Adult Dehydration is condition in which there is not enough water or other fluids in the body. This happens when a person loses more fluids than he or she takes in. Important body parts cannot work right without the right amount of fluids. Any loss of fluids from the body can cause dehydration. Dehydration can be mild, worse, or very bad. It should be treated right away to keep it from getting very bad. What are the causes? This condition may be caused by:  Conditions that cause loss of water or other fluids, such as: ? Watery poop (diarrhea). ? Vomiting. ? Sweating a lot. ? Peeing (urinating) a lot.  Not drinking enough fluids, especially when you: ? Are ill. ? Are doing things that take a lot of energy to do.  Other illnesses and conditions, such as fever or infection.  Certain medicines, such as medicines that take extra fluid out of the body (diuretics).  Lack of safe drinking water.  Not being able to get enough water and food. What increases the risk? The following factors may make you more likely to develop this condition:  Having a long-term (chronic) illness that has not been treated the right way, such as: ? Diabetes. ? Heart disease. ? Kidney disease.  Being 65 years of age or older.  Having a disability.  Living in a place that is high above the ground or sea (high in altitude). The thinner, dried air causes more fluid loss.  Doing exercises that put stress on your body for a long time. What are the signs or symptoms? Symptoms of dehydration depend on how bad it is. Mild or worse dehydration  Thirst.  Dry lips or dry mouth.  Feeling dizzy or light-headed, especially when you stand up from sitting.  Muscle cramps.  Your body making: ? Dark pee (urine). Pee may be the color of tea. ? Less pee than normal. ? Less tears than normal.  Headache. Very bad dehydration  Changes in skin. Skin may: ? Be cold to the touch (clammy). ? Be blotchy  or pale. ? Not go back to normal right after you lightly pinch it and let it go.  Little or no tears, pee, or sweat.  Changes in vital signs, such as: ? Fast breathing. ? Low blood pressure. ? Weak pulse. ? Pulse that is more than 100 beats a minute when you are sitting still.  Other changes, such as: ? Feeling very thirsty. ? Eyes that look hollow (sunken). ? Cold hands and feet. ? Being mixed up (confused). ? Being very tired (lethargic) or having trouble waking from sleep. ? Short-term weight loss. ? Loss of consciousness. How is this treated? Treatment for this condition depends on how bad it is. Treatment should start right away. Do not wait until your condition gets very bad. Very bad dehydration is an emergency. You will need to go to a hospital.  Mild or worse dehydration can be treated at home. You may be asked to: ? Drink more fluids. ? Drink an oral rehydration solution (ORS). This drink helps get the right amounts of fluids and salts and minerals in the blood (electrolytes).  Very bad dehydration can be treated: ? With fluids through an IV tube. ? By getting normal levels of salts and minerals in your blood. This is often done by giving salts and minerals through a tube. The tube is passed through your nose and into your stomach. ? By treating the root cause. Follow these instructions at   home: Oral rehydration solution If told by your doctor, drink an ORS:  Make an ORS. Use instructions on the package.  Start by drinking small amounts, about  cup (120 mL) every 5-10 minutes.  Slowly drink more until you have had the amount that your doctor said to have. Eating and drinking  Drink enough clear fluid to keep your pee pale yellow. If you were told to drink an ORS, finish the ORS first. Then, start slowly drinking other clear fluids. Drink fluids such as: ? Water. Do not drink only water. Doing that can make the salt (sodium) level in your body get too low. ? Water  from ice chips you suck on. ? Fruit juice that you have added water to (diluted). ? Low-calorie sports drinks.  Eat foods that have the right amounts of salts and minerals, such as: ? Bananas. ? Oranges. ? Potatoes. ? Tomatoes. ? Spinach.  Do not drink alcohol.  Avoid: ? Drinks that have a lot of sugar. These include:  High-calorie sports drinks.  Fruit juice that you did not add water to.  Soda.  Caffeine. ? Foods that are greasy or have a lot of fat or sugar.         General instructions  Take over-the-counter and prescription medicines only as told by your doctor.  Do not take salt tablets. Doing that can make the salt level in your body get too high.  Return to your normal activities as told by your doctor. Ask your doctor what activities are safe for you.  Keep all follow-up visits as told by your doctor. This is important. Contact a doctor if:  You have pain in your belly (abdomen) and the pain: ? Gets worse. ? Stays in one place.  You have a rash.  You have a stiff neck.  You get angry or annoyed (irritable) more easily than normal.  You are more tired or have a harder time waking than normal.  You feel: ? Weak or dizzy. ? Very thirsty. Get help right away if you have:  Any symptoms of very bad dehydration.  Symptoms of vomiting, such as: ? You cannot eat or drink without vomiting. ? Your vomiting gets worse or does not go away. ? Your vomit has blood or green stuff in it.  Symptoms that get worse with treatment.  A fever.  A very bad headache.  Problems with peeing or pooping (having a bowel movement), such as: ? Watery poop that gets worse or does not go away. ? Blood in your poop (stool). This may cause poop to look black and tarry. ? Not peeing in 6-8 hours. ? Peeing only a small amount of very dark pee in 6-8 hours.  Trouble breathing. These symptoms may be an emergency. Do not wait to see if the symptoms will go away. Get  medical help right away. Call your local emergency services (911 in the [place] Do not drive yourself to the hospital. Summary  Dehydration is a condition in which there is not enough water or other fluids in the body. This happens when a person loses more fluids than he or she takes in.  Treatment for this condition depends on how bad it is. Treatment should be started right away. Do not wait until your condition gets very bad.  Drink enough clear fluid to keep your pee pale yellow. If you were told to drink an oral rehydration solution (ORS), finish the ORS first. Then, start slowly drinking other clear fluids.    Take over-the-counter and prescription medicines only as told by your doctor.  Get help right away if you have any symptoms of very bad dehydration. This information is not intended to replace advice given to you by your health care provider. Make sure you discuss any questions you have with your health care provider. Document Revised: 02/21/2019 Document Reviewed: 02/21/2019 Elsevier Patient Education  2021 [employer]  

## 2020-11-14 ENCOUNTER — Encounter: Payer: Self-pay | Admitting: Gastroenterology

## 2020-11-24 ENCOUNTER — Encounter: Payer: Self-pay | Admitting: Hematology and Oncology

## 2020-11-24 DIAGNOSIS — J209 Acute bronchitis, unspecified: Secondary | ICD-10-CM | POA: Diagnosis not present

## 2020-11-24 DIAGNOSIS — J440 Chronic obstructive pulmonary disease with acute lower respiratory infection: Secondary | ICD-10-CM | POA: Diagnosis not present

## 2020-11-25 ENCOUNTER — Encounter: Payer: Self-pay | Admitting: Hematology and Oncology

## 2020-11-26 ENCOUNTER — Inpatient Hospital Stay: Payer: Medicare Other

## 2020-11-26 ENCOUNTER — Encounter: Payer: Self-pay | Admitting: Hematology and Oncology

## 2020-11-26 ENCOUNTER — Inpatient Hospital Stay: Payer: Medicare Other | Attending: Oncology | Admitting: Hematology and Oncology

## 2020-11-26 ENCOUNTER — Telehealth: Payer: Self-pay

## 2020-11-26 ENCOUNTER — Other Ambulatory Visit: Payer: Self-pay | Admitting: Hematology and Oncology

## 2020-11-26 ENCOUNTER — Other Ambulatory Visit: Payer: Self-pay

## 2020-11-26 ENCOUNTER — Inpatient Hospital Stay (INDEPENDENT_AMBULATORY_CARE_PROVIDER_SITE_OTHER): Payer: Medicare Other | Admitting: Hematology and Oncology

## 2020-11-26 VITALS — BP 153/66 | HR 79 | Temp 98.4°F | Resp 16 | Ht 63.0 in | Wt 164.8 lb

## 2020-11-26 VITALS — BP 140/57 | HR 81 | Temp 97.7°F | Resp 16 | Wt 164.0 lb

## 2020-11-26 DIAGNOSIS — D509 Iron deficiency anemia, unspecified: Secondary | ICD-10-CM

## 2020-11-26 DIAGNOSIS — J449 Chronic obstructive pulmonary disease, unspecified: Secondary | ICD-10-CM | POA: Insufficient documentation

## 2020-11-26 DIAGNOSIS — E119 Type 2 diabetes mellitus without complications: Secondary | ICD-10-CM | POA: Diagnosis not present

## 2020-11-26 DIAGNOSIS — D696 Thrombocytopenia, unspecified: Secondary | ICD-10-CM | POA: Diagnosis not present

## 2020-11-26 DIAGNOSIS — D500 Iron deficiency anemia secondary to blood loss (chronic): Secondary | ICD-10-CM | POA: Diagnosis present

## 2020-11-26 DIAGNOSIS — I10 Essential (primary) hypertension: Secondary | ICD-10-CM | POA: Diagnosis not present

## 2020-11-26 DIAGNOSIS — Z79899 Other long term (current) drug therapy: Secondary | ICD-10-CM | POA: Diagnosis not present

## 2020-11-26 DIAGNOSIS — Z794 Long term (current) use of insulin: Secondary | ICD-10-CM | POA: Diagnosis not present

## 2020-11-26 DIAGNOSIS — Z87891 Personal history of nicotine dependence: Secondary | ICD-10-CM | POA: Insufficient documentation

## 2020-11-26 DIAGNOSIS — E860 Dehydration: Secondary | ICD-10-CM

## 2020-11-26 DIAGNOSIS — D649 Anemia, unspecified: Secondary | ICD-10-CM | POA: Diagnosis not present

## 2020-11-26 LAB — FERRITIN: Ferritin: 10 ng/mL — ABNORMAL LOW (ref 11–307)

## 2020-11-26 LAB — BASIC METABOLIC PANEL
BUN: 31 — AB (ref 4–21)
CO2: 18 (ref 13–22)
Chloride: 107 (ref 99–108)
Creatinine: 0.9 (ref 0.5–1.1)
Glucose: 194
Potassium: 4.3 (ref 3.4–5.3)
Sodium: 139 (ref 137–147)

## 2020-11-26 LAB — CBC AND DIFFERENTIAL
HCT: 30 — AB (ref 36–46)
Hemoglobin: 9.6 — AB (ref 12.0–16.0)
Neutrophils Absolute: 9.74
Platelets: 161 (ref 150–399)
WBC: 11.2

## 2020-11-26 LAB — AMMONIA: Ammonia: 9

## 2020-11-26 LAB — HEPATIC FUNCTION PANEL
ALT: 22 (ref 7–35)
AST: 23 (ref 13–35)
Alkaline Phosphatase: 64 (ref 25–125)
Bilirubin, Total: 0.5

## 2020-11-26 LAB — CBC
MCV: 98 (ref 81–99)
RBC: 3.05 — AB (ref 3.87–5.11)

## 2020-11-26 LAB — COMPREHENSIVE METABOLIC PANEL
Albumin: 4.5 (ref 3.5–5.0)
Calcium: 9.4 (ref 8.7–10.7)

## 2020-11-26 MED ORDER — SODIUM CHLORIDE 0.9 % IV SOLN
Freq: Once | INTRAVENOUS | Status: AC
  Filled 2020-11-26: qty 250

## 2020-11-26 NOTE — Progress Notes (Signed)
[name]  [address] [place],  [place]  [address] [phone]  Clinic Day:  11/26/2020  Referring physician: Nicoletta Dress, MD   CHIEF COMPLAINT:  CC:  Iron deficiency anemia  Current Treatment:   IV iron as needed   HISTORY OF PRESENT ILLNESS:  [name] is a 70 y.o. female  who we began seeing in December 2019 for iron deficiency anemia.    In November 2018, colonoscopy revealed tubular adenomas.  Upper endoscopy in January 2018 did not reveal any specific findings.  She was clearly iron deficient.  She has multiple comorbidities, including diabetes, liver cirrhosis, COPD, hyperlipidemia, and degenerative disc disease.  Testing for any other deficiency or monoclonal spike was negative. She had already been on oral supplement for nearly 1 year with lack of response, so was given IV iron in the form of Feraheme.  She felt somewhat better after the IV iron, but still complained of fatigue and weakness.  She received IV Feraheme again in June.  Her hemoglobin improved, but was still low at 11.0.   Repeat colonoscopy and EGD were done in August 2020 by Dr. Noberto Retort.  Biopsy was negative.  In September 2020,her hemoglobin had dropped from 9.8 to 9.0 at Dr. Denton Lank office.  When we saw her later in September, the hemoglobin had dropped down to 7.5.  She was transfused with 2 units of packed red blood cells.  Repeat iron studies once again revealed iron deficiency, so was given IV Feraheme.  CT abdomen and pelvis was negative.  She felt so much better after the transfusion that she jumped out of bed and fractured her right foot in a fall.  She had a drop in her hemoglobin again in November and was given IV Feraheme again.  Capsule endoscopy in November 2020 in [facility] revealed multiple AVM's of the small bowel.    We continued to follow her periodically. She had cauterization of an AVM of the small bowel at [facility] in February 2021. In  March 2021, her hemoglobin dropped back down 7.6.  She therefore received IV Feraheme again in early April.  Abdominal ultrasound in May revealed cirrhosis of the liver with nodular surface, increased echogenicity and heterogeneity.  No focal liver parenchymal lesion was observed.  She has had intermittent mild thrombocytopenia, presumably from her liver cirrhosis. She received IV Feraheme in early June, but only one dose due to her leaving town.  She also received 2 units of packed red blood cells at that time. She presented to the [facility] emergency department later in June due to generalized weakness that had been ongoing for 1 week, as well as altered mental status, and was admitted.  She had just returned from a trip to [place].  While out of town, she developed severe epistaxis that required packing and 1 unit of packed red blood cells.  CT imaging revealed small amount of blood in the right maxillary and sphenoid sinuses with tube placed in the right paranasal sinus.  She was found to have a UTI.    She developed a severe epistaxis requiring hospital admission in late July 2021 and had a nasal bullet in place.  Her hemoglobin dropped down to 6.9, so she has been transfused, and given a dose of IV Feraheme while in the hospital.  She had an episode of hypotension and fever and was felt to be septic, so was transferred to the ICU.  She was placed on  broad-spectrum IV antibiotics, and cultures remained negative.  Echocardiogram in July showed a suspicious mass in the right atrium, which may be attached to the interatrial septum.  The cardiologist reviewed the echocardiogram and felt that this was likely an artifact and not a mass, vegetation or thrombus.  The patient continued to complain of pain from the compression device in her nose and described some green drainage from her sinuses, potentially the source of her fever and sepsis.  Her hemoglobin was 8.7 at the time of discharge in July.  She was seen in  August and reported weakness of her hands and and shakiness of her arms and legs.  She was occasionally dropping objects.  She also reported fatigue, dyspnea with exertion and moderate abdominal swelling.  She had a recent follow-up at Dr. Julien Nordmann office with a good cardiac report.  Due to the asterixis, an ammonia level was obtained, and was elevated at 62.  She was therefore placed on lactulose 30 mL twice a day. She was seen a week later with some improvement, but her serum ammonia was still 54, so the lactulose was increased to TID.  She also had hyperkalemia, so her spironolactone 25 mg was held. We then resumed one pill daily along with Lasix 40 mg daily due to recurrent ascites.  She was transfused with 2 units of PRBCs again at the end of September when her hemoglobin dropped down to 7.3.  Iron level is 13.0 with a TIBC of 415 for a saturation of 3.1%. She was scheduled for 2 more doses of IV Feraheme.  She was admitted in October  with a fever of 105 and had E coli sepsis. Lactulose was placed on hold due to severe diarrhea, adding to her dehydration. Her serum ammonia came down to 9, so we did not resume lactulose.  She did have a new splenic infarction as of this admission, which was causing a lot of left abdominal pain.  Her spironolactone was increased to 50 mg to take 1 to 2 daily.  At her visit in November, she says she was placed back on lactulose daily.  When she was seen in early February, her CBC reveals a hemoglobin of 7.6 with normal ANC and platelets.  She was therefore transfused 2 units of PRBCs. Iron studies revealed recurrent iron deficiency.. Ammonia level was elevated at 35,  so her lactulose was increased to 30 cc twice daily.  She was scheduled for IV iron.  She continued to have blood loss with black stools and one episode of moderate epistaxis,  Shehas been very frustrated by the constant recurrent bleeding, which we think is from the AVM's of the small bowel. She would like to  pursue any possible procedures to address that and is scheduled to see the gastroenterologist at [facility] on May 27th.  At her last visit on April 21st, she had worsening anemia with a hemoglobin of 10.6.  She was dehydrated so was given IV fluids..  She reported dysuria, but urine culture was negative.  We recommended closer follow-up.  INTERVAL HISTORY:  Jennavive is here today for repeat clinical assessment and states that she has bronchitis.  She feels she is dehydrated as she is lightheaded. She reports worsening fatigue and weakness.  She saw Dr. Delena Bali on Monday due to cough productive purulent sputum.  She was placed on levofloxacin, as well as inhaler.  She states her cough and congestion are improving.  She reports dyspnea with exertion, but not as severe as  when her hemoglobin is extremely low.  She denies lack of taste or smell.  She denies fevers, chills or body aches. She denies pain. Her appetite is poor, but she is trying to drink plenty of fluids. Her weight has decreased 3 pounds over last 2 weeks. She continues furosemide daily and spironolactone twice a day.  REVIEW OF SYSTEMS:  Review of Systems  Constitutional: Positive for appetite change (Attributed to recent infection and antibiotic use), fatigue (Worsening) and unexpected weight change. Negative for chills and fever.  HENT:   Negative for lump/mass, mouth sores and sore throat.   Respiratory: Positive for cough (Improving with antibiotics) and shortness of breath (Mild).   Cardiovascular: Negative for chest pain and leg swelling.  Gastrointestinal: Positive for nausea (Attributed to antibiotic use). Negative for abdominal pain, constipation, diarrhea and vomiting.  Endocrine: Negative for hot flashes.  Genitourinary: Negative for difficulty urinating, dysuria, frequency and hematuria.   Musculoskeletal: Negative for arthralgias, back pain and myalgias.  Skin: Negative for rash.  Neurological: Positive for light-headedness. Negative  for dizziness and headaches.  Hematological: Negative for adenopathy. Does not bruise/bleed easily.  Psychiatric/Behavioral: Negative for depression and sleep disturbance. The patient is not nervous/anxious.    VITALS:  Blood pressure (!) 153/66, pulse 79, temperature 98.4 F (36.9 C), temperature source Oral, resp. rate 16, height 5\' 3"  (1.6 m), weight 164 lb 12.8 oz (74.8 kg), SpO2 97 %.  Wt Readings from Last 3 Encounters:  11/26/20 164 lb (74.4 kg)  11/26/20 164 lb 12.8 oz (74.8 kg)  11/12/20 167 lb (75.8 kg)    Body mass index is 29.19 kg/m.  Performance status (ECOG): 1 - Symptomatic but completely ambulatory  PHYSICAL EXAM:  Physical Exam Vitals and nursing note reviewed.  Constitutional:      General: She is not in acute distress.    Appearance: Normal appearance.  HENT:     Head: Normocephalic and atraumatic.     Mouth/Throat:     Mouth: Mucous membranes are moist.     Pharynx: Oropharynx is clear. No oropharyngeal exudate or posterior oropharyngeal erythema.  Eyes:     General: No scleral icterus.    Extraocular Movements: Extraocular movements intact.     Conjunctiva/sclera: Conjunctivae normal.     Pupils: Pupils are equal, round, and reactive to light.  Cardiovascular:     Rate and Rhythm: Normal rate and regular rhythm.     Heart sounds: Normal heart sounds. No murmur heard. No friction rub. No gallop.   Pulmonary:     Effort: Pulmonary effort is normal.     Breath sounds: Normal breath sounds. No wheezing, rhonchi or rales.  Chest:  Breasts:     Right: No axillary adenopathy or supraclavicular adenopathy.     Left: No axillary adenopathy or supraclavicular adenopathy.    Abdominal:     General: There is no distension.     Palpations: Abdomen is soft. There is no fluid wave, hepatomegaly, splenomegaly or mass.     Tenderness: There is no abdominal tenderness.     Comments:   Mild ascites  Musculoskeletal:        General: Normal range of motion.      Cervical back: Normal range of motion and neck supple. No tenderness.     Right lower leg: No edema.     Left lower leg: No edema.  Lymphadenopathy:     Cervical: No cervical adenopathy.     Upper Body:     Right upper body:  No supraclavicular or axillary adenopathy.     Left upper body: No supraclavicular or axillary adenopathy.     Lower Body: No right inguinal adenopathy. No left inguinal adenopathy.  Skin:    General: Skin is warm and dry.     Coloration: Skin is not jaundiced.     Findings: No rash.  Neurological:     Mental Status: She is alert and oriented to person, place, and time.     Cranial Nerves: No cranial nerve deficit.  Psychiatric:        Mood and Affect: Mood normal.        Behavior: Behavior normal.        Thought Content: Thought content normal.    LABS:   CBC Latest Ref Rng & Units 11/26/2020 11/12/2020 10/22/2020  WBC - 11.2 5.8 4.3  Hemoglobin 12.0 - 16.0 9.6(A) 10.6(A) 11.1(A)  Hematocrit 36 - 46 30(A) 32(A) 33(A)  Platelets 150 - 399 161 152 143(A)   CMP Latest Ref Rng & Units 11/26/2020 11/12/2020 10/22/2020  Glucose 70 - 99 mg/dL - - -  BUN 4 - 21 31(A) 18 13  Creatinine 0.5 - 1.1 0.9 1.0 0.8  Sodium 137 - 147 139 136(A) 140  Potassium 3.4 - 5.3 4.3 4.9 3.8  Chloride 99 - 108 107 107 107  CO2 13 - 22 18 21  24(A)  Calcium 8.7 - 10.7 9.4 9.5 10.1  Total Protein 6.0 - 8.3 g/dL - - -  Total Bilirubin 0.3 - 1.2 mg/dL - - -  Alkaline Phos 25 - 125 64 71 72  AST 13 - 35 23 50(A) 36(A)  ALT 7 - 35 22 26 22    Ammonia is normal at less than 9   No results found for: CEA1 / No results found for: CEA1 No results found for: PSA1 No results found for: [id number] No results found for: CAN125  No results found for: Ronnald Ramp, A1GS, A2GS, BETS, BETA2SER, GAMS, MSPIKE, SPEI Lab Results  Component Value Date   TIBC 483 (H) 10/22/2020   TIBC 476 08/26/2020   TIBC 318 05/25/2020   FERRITIN 38 10/22/2020   FERRITIN 6.4 08/26/2020   FERRITIN 205  05/25/2020   IRONPCTSAT 17 10/22/2020   IRONPCTSAT 5.2 08/26/2020   IRONPCTSAT 25 05/25/2020   No results found for: LDH  STUDIES:  No results found.    HISTORY:   Past Medical History:  Diagnosis Date  . Anemia 07/02/2018  . Aortic stenosis, mild 09/03/2018  . Cirrhosis of liver without ascites ([place]) 07/03/2019  . Diabetes mellitus due to underlying condition with unspecified complications ([place]) [id number]  . Diabetes mellitus without complication ([place])   . Essential hypertension 07/02/2018  . Hematochezia 06/20/2016  . History of colonic polyps 02/18/2019  . Hyperlipidemia   . Hypertension   . Iron deficiency anemia 06/20/2016  . Iron deficiency anemia, unspecified   . RUQ pain 06/20/2016  . Tightness in chest 09/03/2018    Past Surgical History:  Procedure Laterality Date  . ABDOMINAL HYSTERECTOMY    . BACK SURGERY      Family History  Problem Relation Age of Onset  . Heart disease Mother   . Alzheimer's disease Mother   . Cancer Father   . Lymphoma Father   . Parkinson's disease Paternal Grandmother   . Kidney cancer Paternal Uncle     Social History:  reports that she has quit smoking. She has never used smokeless tobacco. She reports that she does not drink alcohol  and does not use drugs.The patient is alone today.  Allergies: No Known Allergies  Current Medications: Current Outpatient Medications  Medication Sig Dispense Refill  . albuterol (VENTOLIN HFA) 108 (90 Base) MCG/ACT inhaler Inhale into the lungs.    . colesevelam (WELCHOL) 625 MG tablet Take 1,875 mg by mouth 2 (two) times daily.    . furosemide (LASIX) 40 MG tablet Take 1 tablet by mouth daily.    . insulin aspart (NOVOLOG) 100 UNIT/ML FlexPen Inject 30 Units into the skin 2 (two) times daily.    Marland Kitchen lactulose (CHRONULAC) 10 GM/15ML solution Take 20 g by mouth 1 day or 1 dose.  5  . LEVEMIR FLEXTOUCH 100 UNIT/ML Pen Inject 40 Units into the skin 2 (two) times daily.  12  . levofloxacin (LEVAQUIN)  750 MG tablet Take 750 mg by mouth daily.    Marland Kitchen levothyroxine (SYNTHROID) 50 MCG tablet Take 50 mcg by mouth daily.    Marland Kitchen liraglutide (VICTOZA) 18 MG/3ML SOPN Inject 1.8 mg into the skin daily.     Marland Kitchen lisinopril (PRINIVIL,ZESTRIL) 40 MG tablet Take 1 tablet by mouth daily.  3  . metFORMIN (GLUCOPHAGE-XR) 500 MG 24 hr tablet Take 500 mg by mouth 2 (two) times daily with a meal.  5  . montelukast (SINGULAIR) 10 MG tablet Take 10 mg by mouth at bedtime.    . nitrofurantoin, macrocrystal-monohydrate, (MACROBID) 100 MG capsule Take 1 capsule (100 mg total) by mouth 2 (two) times daily. 14 capsule 0  . nystatin (MYCOSTATIN/NYSTOP) powder     . omeprazole (PRILOSEC) 20 MG capsule Take 1 capsule by mouth daily.  3  . ondansetron (ZOFRAN) 4 MG tablet Take 1 tablet (4 mg total) by mouth every 4 (four) hours as needed for nausea. 90 tablet 3  . oxyCODONE-acetaminophen (PERCOCET/ROXICET) 5-325 MG tablet Take 1 tablet by mouth every 4 (four) hours.    . pravastatin (PRAVACHOL) 20 MG tablet Take 1 tablet by mouth once a week.    . pregabalin (LYRICA) 75 MG capsule Take 75 mg by mouth at bedtime.  1  . rOPINIRole (REQUIP) 2 MG tablet Take 1 tablet by mouth at bedtime.  3  . spironolactone (ALDACTONE) 50 MG tablet Take 1 tablet (50 mg total) by mouth 2 (two) times daily. 60 tablet 5  . sulfamethoxazole-trimethoprim (BACTRIM DS) 800-160 MG tablet Take 1 tablet by mouth 2 (two) times daily. 20 tablet 1  . valACYclovir (VALTREX) 1000 MG tablet Take 1,000 mg by mouth 2 (two) times daily.    . Vitamin D, Ergocalciferol, (DRISDOL) 1.25 MG (50000 UT) CAPS capsule Take 1 capsule by mouth once a week.     No current facility-administered medications for this visit.     ASSESSMENT & PLAN:   Assessment:  1. Iron deficiency anemia, secondary to chronic GI blood loss from AVM's, and intermittent epistaxis.  She has required regular IV iron replacement and transfusions, so we want to pursue any possible treatment for her  AVM's. Her hemoglobin continues to decrease, so I will add iron studies again today and give IV Feraheme again if needed.  We will plan to see her back week for close follow-up due to the worsening anemia.   2. Non-alcoholic liver cirrhosis, with intermittent mild thrombocytopenia.  Her anemia is also likely partly related to the cirrhosis.  3. Ascites, for which she is on spironolactone and furosemide.  Due to the dehydration, I will have her decrease her spironolactone to once a day.  4. Hepatic  encephalopathy, for which she takes lactulose twice daily and her ammonia level remains normal.   5. Multiple AVM's of the small bowel seen on capsule endoscopy in [facility], treated at [facility] in February 2021.  She is scheduled to see Dr. Malissa Hippo at [facility] on May 27th.  6. Clinical dehydration, I will give her IV fluids today.   Plan:    She will receive IV fluids today.  She will continue the antibiotic as prescribed by Dr. Delena Bali as her respiratory symptoms are improving. Due to the worsening anemia, I will obtain iron studies today and give IV Feraheme as needed. I will plan to see her back in 1 week with a CBC and comprehensive metabolic panel for repeat clinical assessment.      Thalia Bloodgood Jaeceon Michelin, PA-C    I provided 30 minutes of face-to-face time during this this encounter and > 50% was spent counseling as documented under my assessment and plan.

## 2020-11-26 NOTE — Telephone Encounter (Addendum)
Pt assessed, and placed on Kelli's schedule per [facility], request.       I received the message below from [place], lab tech, that pt is requesting to be seen. Pt is here for repeat labs.   [place],Destiny WOULD LIKE TO SPEAK TO YOU, SHE IS WEAK,FATIGUED AND SOME SHORT OF BREATH FOR ACOUPLE DAYS.. SHE HAS SEEN A PCP ABOUT THIS AND THEY SAID POSS. PNEUMONIA. SHE IS IN THE TRIAGE ROOM WHEN YOU GET A CHANCE TO SPEAK TO HER

## 2020-11-26 NOTE — Patient Instructions (Signed)
Dehydration, Adult Dehydration is condition in which there is not enough water or other fluids in the body. This happens when a person loses more fluids than he or she takes in. Important body parts cannot work right without the right amount of fluids. Any loss of fluids from the body can cause dehydration. Dehydration can be mild, worse, or very bad. It should be treated right away to keep it from getting very bad. What are the causes? This condition may be caused by:  Conditions that cause loss of water or other fluids, such as: ? Watery poop (diarrhea). ? Vomiting. ? Sweating a lot. ? Peeing (urinating) a lot.  Not drinking enough fluids, especially when you: ? Are ill. ? Are doing things that take a lot of energy to do.  Other illnesses and conditions, such as fever or infection.  Certain medicines, such as medicines that take extra fluid out of the body (diuretics).  Lack of safe drinking water.  Not being able to get enough water and food. What increases the risk? The following factors may make you more likely to develop this condition:  Having a long-term (chronic) illness that has not been treated the right way, such as: ? Diabetes. ? Heart disease. ? Kidney disease.  Being 65 years of age or older.  Having a disability.  Living in a place that is high above the ground or sea (high in altitude). The thinner, dried air causes more fluid loss.  Doing exercises that put stress on your body for a long time. What are the signs or symptoms? Symptoms of dehydration depend on how bad it is. Mild or worse dehydration  Thirst.  Dry lips or dry mouth.  Feeling dizzy or light-headed, especially when you stand up from sitting.  Muscle cramps.  Your body making: ? Dark pee (urine). Pee may be the color of tea. ? Less pee than normal. ? Less tears than normal.  Headache. Very bad dehydration  Changes in skin. Skin may: ? Be cold to the touch (clammy). ? Be blotchy  or pale. ? Not go back to normal right after you lightly pinch it and let it go.  Little or no tears, pee, or sweat.  Changes in vital signs, such as: ? Fast breathing. ? Low blood pressure. ? Weak pulse. ? Pulse that is more than 100 beats a minute when you are sitting still.  Other changes, such as: ? Feeling very thirsty. ? Eyes that look hollow (sunken). ? Cold hands and feet. ? Being mixed up (confused). ? Being very tired (lethargic) or having trouble waking from sleep. ? Short-term weight loss. ? Loss of consciousness. How is this treated? Treatment for this condition depends on how bad it is. Treatment should start right away. Do not wait until your condition gets very bad. Very bad dehydration is an emergency. You will need to go to a hospital.  Mild or worse dehydration can be treated at home. You may be asked to: ? Drink more fluids. ? Drink an oral rehydration solution (ORS). This drink helps get the right amounts of fluids and salts and minerals in the blood (electrolytes).  Very bad dehydration can be treated: ? With fluids through an IV tube. ? By getting normal levels of salts and minerals in your blood. This is often done by giving salts and minerals through a tube. The tube is passed through your nose and into your stomach. ? By treating the root cause. Follow these instructions at   home: Oral rehydration solution If told by your doctor, drink an ORS:  Make an ORS. Use instructions on the package.  Start by drinking small amounts, about  cup (120 mL) every 5-10 minutes.  Slowly drink more until you have had the amount that your doctor said to have. Eating and drinking  Drink enough clear fluid to keep your pee pale yellow. If you were told to drink an ORS, finish the ORS first. Then, start slowly drinking other clear fluids. Drink fluids such as: ? Water. Do not drink only water. Doing that can make the salt (sodium) level in your body get too low. ? Water  from ice chips you suck on. ? Fruit juice that you have added water to (diluted). ? Low-calorie sports drinks.  Eat foods that have the right amounts of salts and minerals, such as: ? Bananas. ? Oranges. ? Potatoes. ? Tomatoes. ? Spinach.  Do not drink alcohol.  Avoid: ? Drinks that have a lot of sugar. These include:  High-calorie sports drinks.  Fruit juice that you did not add water to.  Soda.  Caffeine. ? Foods that are greasy or have a lot of fat or sugar.         General instructions  Take over-the-counter and prescription medicines only as told by your doctor.  Do not take salt tablets. Doing that can make the salt level in your body get too high.  Return to your normal activities as told by your doctor. Ask your doctor what activities are safe for you.  Keep all follow-up visits as told by your doctor. This is important. Contact a doctor if:  You have pain in your belly (abdomen) and the pain: ? Gets worse. ? Stays in one place.  You have a rash.  You have a stiff neck.  You get angry or annoyed (irritable) more easily than normal.  You are more tired or have a harder time waking than normal.  You feel: ? Weak or dizzy. ? Very thirsty. Get help right away if you have:  Any symptoms of very bad dehydration.  Symptoms of vomiting, such as: ? You cannot eat or drink without vomiting. ? Your vomiting gets worse or does not go away. ? Your vomit has blood or green stuff in it.  Symptoms that get worse with treatment.  A fever.  A very bad headache.  Problems with peeing or pooping (having a bowel movement), such as: ? Watery poop that gets worse or does not go away. ? Blood in your poop (stool). This may cause poop to look black and tarry. ? Not peeing in 6-8 hours. ? Peeing only a small amount of very dark pee in 6-8 hours.  Trouble breathing. These symptoms may be an emergency. Do not wait to see if the symptoms will go away. Get  medical help right away. Call your local emergency services (911 in the [place] Do not drive yourself to the hospital. Summary  Dehydration is a condition in which there is not enough water or other fluids in the body. This happens when a person loses more fluids than he or she takes in.  Treatment for this condition depends on how bad it is. Treatment should be started right away. Do not wait until your condition gets very bad.  Drink enough clear fluid to keep your pee pale yellow. If you were told to drink an oral rehydration solution (ORS), finish the ORS first. Then, start slowly drinking other clear fluids.    Take over-the-counter and prescription medicines only as told by your doctor.  Get help right away if you have any symptoms of very bad dehydration. This information is not intended to replace advice given to you by your health care provider. Make sure you discuss any questions you have with your health care provider. Document Revised: 02/21/2019 Document Reviewed: 02/21/2019 Elsevier Patient Education  2021 [employer]  

## 2020-11-26 NOTE — Progress Notes (Signed)
Pt was here for repeat labs today, as ordered by Melissa,NP. Pt reports to lab tech, Isaias Sakai, that she doesn't feel well, has had SOB for 2 days and would like to see someone. She saw her PCP earlier this week.  I triaged pt, took VS, and gathered information. Pt reports she "feels really weak and fatigued. I feel like I'm dehydrated". She saw Dr Delena Bali, her PCP on Monday or Tuesday. She states he thought she had bronchitis, and gave her Levaquin & a new inhaler. Pt denies fever since earlier in week (it was T 99). Denies N/V, diarrhea. She does have prod cough, green in color and admits to sore throat. I asked if her PCP checked her for COVID or Flu. She replied, "No".   Gabriel Rung, & Melissa, NP aware of above. Kelli wanted pt added to her schedule.

## 2020-11-26 NOTE — Progress Notes (Signed)
Pt d/c stable at 1410 

## 2020-11-27 ENCOUNTER — Other Ambulatory Visit: Payer: Self-pay | Admitting: Hematology and Oncology

## 2020-11-27 ENCOUNTER — Telehealth: Payer: Self-pay | Admitting: Hematology and Oncology

## 2020-11-27 NOTE — Telephone Encounter (Signed)
Per 5/6 Staff Message, patient scheduled for 5/10, 5/17 Feraheme - Patient notified

## 2020-12-01 ENCOUNTER — Inpatient Hospital Stay: Payer: Medicare Other

## 2020-12-01 ENCOUNTER — Other Ambulatory Visit: Payer: Self-pay

## 2020-12-01 VITALS — BP 142/58 | HR 78 | Temp 97.7°F | Resp 18 | Ht 63.0 in | Wt 166.0 lb

## 2020-12-01 DIAGNOSIS — E119 Type 2 diabetes mellitus without complications: Secondary | ICD-10-CM | POA: Diagnosis not present

## 2020-12-01 DIAGNOSIS — I10 Essential (primary) hypertension: Secondary | ICD-10-CM | POA: Diagnosis not present

## 2020-12-01 DIAGNOSIS — D509 Iron deficiency anemia, unspecified: Secondary | ICD-10-CM | POA: Diagnosis not present

## 2020-12-01 DIAGNOSIS — D696 Thrombocytopenia, unspecified: Secondary | ICD-10-CM | POA: Diagnosis not present

## 2020-12-01 DIAGNOSIS — J449 Chronic obstructive pulmonary disease, unspecified: Secondary | ICD-10-CM | POA: Diagnosis not present

## 2020-12-01 DIAGNOSIS — D500 Iron deficiency anemia secondary to blood loss (chronic): Secondary | ICD-10-CM

## 2020-12-01 DIAGNOSIS — Z87891 Personal history of nicotine dependence: Secondary | ICD-10-CM | POA: Diagnosis not present

## 2020-12-01 MED ORDER — FERUMOXYTOL INJECTION 510 MG/17 ML
510.0000 mg | Freq: Once | INTRAVENOUS | Status: AC
  Administered 2020-12-01: 510 mg via INTRAVENOUS
  Filled 2020-12-01: qty 510

## 2020-12-01 MED ORDER — SODIUM CHLORIDE 0.9 % IV SOLN
Freq: Once | INTRAVENOUS | Status: AC
  Filled 2020-12-01: qty 250

## 2020-12-01 NOTE — Patient Instructions (Signed)
Ferumoxytol injection What is this medicine? FERUMOXYTOL is an iron complex. Iron is used to make healthy red blood cells, which carry oxygen and nutrients throughout the body. This medicine is used to treat iron deficiency anemia. This medicine may be used for other purposes; ask your health care provider or pharmacist if you have questions. COMMON BRAND NAME(S): Feraheme What should I tell my health care provider before I take this medicine? They need to know if you have any of these conditions:  anemia not caused by low iron levels  high levels of iron in the blood  magnetic resonance imaging (MRI) test scheduled  an unusual or allergic reaction to iron, other medicines, foods, dyes, or preservatives  pregnant or trying to get pregnant  breast-feeding How should I use this medicine? This medicine is for injection into a vein. It is given by a health care professional in a hospital or clinic setting. Talk to your pediatrician regarding the use of this medicine in children. Special care may be needed. Overdosage: If you think you have taken too much of this medicine contact a poison control center or emergency room at once. NOTE: This medicine is only for you. Do not share this medicine with others. What if I miss a dose? It is important not to miss your dose. Call your doctor or health care professional if you are unable to keep an appointment. What may interact with this medicine? This medicine may interact with the following medications:  other iron products This list may not describe all possible interactions. Give your health care provider a list of all the medicines, herbs, non-prescription drugs, or dietary supplements you use. Also tell them if you smoke, drink alcohol, or use illegal drugs. Some items may interact with your medicine. What should I watch for while using this medicine? Visit your doctor or healthcare professional regularly. Tell your doctor or healthcare  professional if your symptoms do not start to get better or if they get worse. You may need blood work done while you are taking this medicine. You may need to follow a special diet. Talk to your doctor. Foods that contain iron include: whole grains/cereals, dried fruits, beans, or peas, leafy green vegetables, and organ meats (liver, kidney). What side effects may I notice from receiving this medicine? Side effects that you should report to your doctor or health care professional as soon as possible:  allergic reactions like skin rash, itching or hives, swelling of the face, lips, or tongue  breathing problems  changes in blood pressure  feeling faint or lightheaded, falls  fever or chills  flushing, sweating, or hot feelings  swelling of the ankles or feet Side effects that usually do not require medical attention (report to your doctor or health care professional if they continue or are bothersome):  diarrhea  headache  nausea, vomiting  stomach pain This list may not describe all possible side effects. Call your doctor for medical advice about side effects. You may report side effects to FDA at 1-800-FDA-1088. Where should I keep my medicine? This drug is given in a hospital or clinic and will not be stored at home. NOTE: This sheet is a summary. It may not cover all possible information. If you have questions about this medicine, talk to your doctor, pharmacist, or health care provider.  2021 Elsevier/Gold Standard (2016-08-29 20:21:10)  

## 2020-12-02 ENCOUNTER — Other Ambulatory Visit: Payer: Self-pay | Admitting: Hematology and Oncology

## 2020-12-02 DIAGNOSIS — D500 Iron deficiency anemia secondary to blood loss (chronic): Secondary | ICD-10-CM

## 2020-12-03 ENCOUNTER — Telehealth: Payer: Self-pay

## 2020-12-03 ENCOUNTER — Encounter: Payer: Self-pay | Admitting: Hematology and Oncology

## 2020-12-03 ENCOUNTER — Inpatient Hospital Stay: Payer: Medicare Other

## 2020-12-03 ENCOUNTER — Inpatient Hospital Stay (INDEPENDENT_AMBULATORY_CARE_PROVIDER_SITE_OTHER): Payer: Medicare Other | Admitting: Hematology and Oncology

## 2020-12-03 ENCOUNTER — Other Ambulatory Visit: Payer: Self-pay

## 2020-12-03 ENCOUNTER — Telehealth: Payer: Self-pay | Admitting: Hematology and Oncology

## 2020-12-03 VITALS — BP 193/81 | HR 102 | Temp 98.2°F | Resp 18 | Ht 63.0 in | Wt 171.0 lb

## 2020-12-03 DIAGNOSIS — K7581 Nonalcoholic steatohepatitis (NASH): Secondary | ICD-10-CM | POA: Diagnosis not present

## 2020-12-03 DIAGNOSIS — D649 Anemia, unspecified: Secondary | ICD-10-CM | POA: Diagnosis not present

## 2020-12-03 DIAGNOSIS — K7460 Unspecified cirrhosis of liver: Secondary | ICD-10-CM

## 2020-12-03 DIAGNOSIS — D509 Iron deficiency anemia, unspecified: Secondary | ICD-10-CM | POA: Diagnosis not present

## 2020-12-03 DIAGNOSIS — D500 Iron deficiency anemia secondary to blood loss (chronic): Secondary | ICD-10-CM | POA: Diagnosis not present

## 2020-12-03 LAB — CBC AND DIFFERENTIAL
HCT: 28 — AB (ref 36–46)
Hemoglobin: 9.3 — AB (ref 12.0–16.0)
Neutrophils Absolute: 9.13
Platelets: 121 — AB (ref 150–399)
WBC: 11

## 2020-12-03 LAB — HEPATIC FUNCTION PANEL
ALT: 30 (ref 7–35)
AST: 43 — AB (ref 13–35)
Alkaline Phosphatase: 72 (ref 25–125)
Bilirubin, Total: 0.8

## 2020-12-03 LAB — COMPREHENSIVE METABOLIC PANEL
Albumin: 3.8 (ref 3.5–5.0)
Calcium: 9.1 (ref 8.7–10.7)

## 2020-12-03 LAB — BASIC METABOLIC PANEL
BUN: 20 (ref 4–21)
CO2: 21 (ref 13–22)
Chloride: 109 — AB (ref 99–108)
Creatinine: 0.9 (ref 0.5–1.1)
Glucose: 56
Potassium: 3.9 (ref 3.4–5.3)
Sodium: 139 (ref 137–147)

## 2020-12-03 LAB — CBC: RBC: 2.91 — AB (ref 3.87–5.11)

## 2020-12-03 NOTE — Telephone Encounter (Signed)
Per 5/12 LOS, patient scheduled for 5/26 Labs, Follow Up,  Gave patient Appt Summary

## 2020-12-03 NOTE — Telephone Encounter (Signed)
Called patient to let her know that her Ammonia level is 35 to take her Lactulose twice a day and her blood sugar was 56. She needed to drink something sugary and eat. Also she needs to follow up with PCP regarding sugar levels. She states she checks her blood sugars 2-3 times a day.

## 2020-12-03 NOTE — Progress Notes (Signed)
[place]  [address] [place],  [place]  [address] [phone]  Clinic Day:  12/03/2020  Referring physician: Nicoletta Dress, MD   CHIEF COMPLAINT:  CC:  Iron deficiency anemia  Current Treatment:   IV iron as needed   HISTORY OF PRESENT ILLNESS:  [name] is a 70 y.o. female  who we began seeing in December 2019 for iron deficiency anemia.    In November 2018, colonoscopy revealed tubular adenomas.  Upper endoscopy in January 2018 did not reveal any specific findings.  She was clearly iron deficient.  She has multiple comorbidities, including diabetes, liver cirrhosis, COPD, hyperlipidemia, and degenerative disc disease.  Testing for any other deficiency or monoclonal spike was negative. She had already been on oral supplement for nearly 1 year with lack of response, so was given IV iron in the form of Feraheme.  She felt somewhat better after the IV iron, but still complained of fatigue and weakness.  She received IV Feraheme again in June.  Her hemoglobin improved, but was still low at 11.0.   Repeat colonoscopy and EGD were done in August 2020 by Dr. Noberto Retort.  Biopsy was negative.  In September 2020,her hemoglobin had dropped from 9.8 to 9.0 at Dr. Denton Lank office.  When we saw her later in September, the hemoglobin had dropped down to 7.5.  She was transfused with 2 units of packed red blood cells.  Repeat iron studies once again revealed iron deficiency, so was given IV Feraheme.  CT abdomen and pelvis was negative.  She felt so much better after the transfusion that she jumped out of bed and fractured her right foot in a fall.  She had a drop in her hemoglobin again in November and was given IV Feraheme again.  Capsule endoscopy in November 2020 in [facility] revealed multiple AVM's of the small bowel.    We continued to follow her periodically. She had cauterization of an AVM of the small bowel at [facility] in February 2021. In  March 2021, her hemoglobin dropped back down 7.6.  She therefore received IV Feraheme again in early April.  Abdominal ultrasound in May revealed cirrhosis of the liver with nodular surface, increased echogenicity and heterogeneity.  No focal liver parenchymal lesion was observed.  She has had intermittent mild thrombocytopenia, presumably from her liver cirrhosis. She received IV Feraheme in early June, but only one dose due to her leaving town.  She also received 2 units of packed red blood cells at that time. She presented to the [facility] emergency department later in June due to generalized weakness that had been ongoing for 1 week, as well as altered mental status, and was admitted.  She had just returned from a trip to [place].  While out of town, she developed severe epistaxis that required packing and 1 unit of packed red blood cells.  CT imaging revealed small amount of blood in the right maxillary and sphenoid sinuses with tube placed in the right paranasal sinus.  She was found to have a UTI.    She developed a severe epistaxis requiring hospital admission in late July 2021 and had a nasal bullet in place.  Her hemoglobin dropped down to 6.9, so she has been transfused, and given a dose of IV Feraheme while in the hospital.  She had an episode of hypotension and fever and was felt to be septic, so was transferred to the ICU.  She was placed on  broad-spectrum IV antibiotics, and cultures remained negative.  Echocardiogram in July showed a suspicious mass in the right atrium, which may be attached to the interatrial septum.  The cardiologist reviewed the echocardiogram and felt that this was likely an artifact and not a mass, vegetation or thrombus.  The patient continued to complain of pain from the compression device in her nose and described some green drainage from her sinuses, potentially the source of her fever and sepsis.  Her hemoglobin was 8.7 at the time of discharge in July.  She was seen in  August and reported weakness of her hands and and shakiness of her arms and legs.  She was occasionally dropping objects.  She also reported fatigue, dyspnea with exertion and moderate abdominal swelling.  She had a recent follow-up at Dr. Julien Nordmann office with a good cardiac report.  Due to the asterixis, an ammonia level was obtained, and was elevated at 62.  She was therefore placed on lactulose 30 mL twice a day. She was seen a week later with some improvement, but her serum ammonia was still 54, so the lactulose was increased to TID.  She also had hyperkalemia, so her spironolactone 25 mg was held. We then resumed one pill daily along with Lasix 40 mg daily due to recurrent ascites.  She was transfused with 2 units of PRBCs again at the end of September when her hemoglobin dropped down to 7.3.  Iron level is 13.0 with a TIBC of 415 for a saturation of 3.1%. She was scheduled for 2 more doses of IV Feraheme.  She was admitted in October  with a fever of 105 and had E coli sepsis. Lactulose was placed on hold due to severe diarrhea, adding to her dehydration. Her serum ammonia came down to 9, so we did not resume lactulose.  She did have a new splenic infarction as of this admission, which was causing a lot of left abdominal pain.  Her spironolactone was increased to 50 mg to take 1 to 2 daily.  At her visit in November, she says she was placed back on lactulose daily.  When she was seen in early February, her CBC reveals a hemoglobin of 7.6 with normal ANC and platelets.  She was therefore transfused 2 units of PRBCs. Iron studies revealed recurrent iron deficiency.. Ammonia level was elevated at 35,  so her lactulose was increased to 30 cc twice daily.  She was scheduled for IV iron.  She continued to have blood loss with black stools and one episode of moderate epistaxis,  Shehas been very frustrated by the constant recurrent bleeding, which we think is from the AVM's of the small bowel. She would like to  pursue any possible procedures to address that and is scheduled to see the gastroenterologist at [facility] on May 27th.   At her visit on April 21st, she had worsening anemia with a hemoglobin of 10.6.  She was dehydrated, so was given IV fluids..  She reported dysuria, but urine culture was negative.  We recommended closer follow-up. She was seen again on May 5th at which time her hemoglobin had decreased to 9.6.  Her ferritin was 10, which is consistent with recurrent iron deficiency.  So, I recommended treatment with IV Feraheme again. She was once again dehydrated , so received IV fluids.  As she was dehydrated on furosemide daily and spironolactone twice daily, so I had her decrease her spironolactone to once a day. The ammonia level was normal.  She  was on treatment for bronchitis, with levofloxacin, as well as an inhaler.  INTERVAL HISTORY:  Rache is here today for repeat clinical assessment. She received her 1st dose of Feraheme on May 10th. She reports increased fatigue and sleepiness.  She also reports increased abdominal discomfort and shortness of breath. She denies any overt form of blood loss.  She denies fevers or chills. She denies pain. Her appetite is fair. She has been drinking more fluids and eating popsicles. Her weight has increased 7 pounds over last 1 week. She states she is taking spironolactone 25 mg daily, but had been taking 50 mg twice daily. There was confusion, as I thought she only had 50 mg tablets at home, but she also had 25 mg tablets.  She states she is now only taking lactulose daily.  REVIEW OF SYSTEMS:  Review of Systems  Constitutional: Positive for fatigue. Negative for appetite change, chills, fever and unexpected weight change.  HENT:   Negative for lump/mass, mouth sores and sore throat.   Respiratory: Positive for shortness of breath. Negative for cough.   Cardiovascular: Negative for chest pain and leg swelling.  Gastrointestinal: Negative for abdominal pain,  blood in stool, constipation, diarrhea, nausea and vomiting.  Endocrine: Negative for hot flashes.  Genitourinary: Negative for difficulty urinating, dysuria, frequency and hematuria.   Musculoskeletal: Negative for arthralgias, back pain and myalgias.  Skin: Negative for rash.  Neurological: Negative for dizziness and headaches.  Hematological: Negative for adenopathy. Does not bruise/bleed easily.  Psychiatric/Behavioral: Negative for depression and sleep disturbance. The patient is not nervous/anxious.    VITALS:  Blood pressure (!) 193/81, pulse (!) 102, temperature 98.2 F (36.8 C), temperature source Oral, resp. rate 18, height 5\' 3"  (1.6 m), weight 171 lb (77.6 kg), SpO2 97 %.  Wt Readings from Last 3 Encounters:  12/03/20 171 lb (77.6 kg)  12/01/20 166 lb (75.3 kg)  11/26/20 164 lb (74.4 kg)    Body mass index is 30.29 kg/m.  Performance status (ECOG): 1 - Symptomatic but completely ambulatory  PHYSICAL EXAM:  Physical Exam Vitals and nursing note reviewed.  Constitutional:      General: She is not in acute distress.    Appearance: Normal appearance.  HENT:     Head: Normocephalic and atraumatic.     Mouth/Throat:     Mouth: Mucous membranes are moist.     Pharynx: Oropharynx is clear. No oropharyngeal exudate or posterior oropharyngeal erythema.  Eyes:     General: No scleral icterus.    Extraocular Movements: Extraocular movements intact.     Conjunctiva/sclera: Conjunctivae normal.     Pupils: Pupils are equal, round, and reactive to light.  Cardiovascular:     Rate and Rhythm: Normal rate and regular rhythm.     Heart sounds: Normal heart sounds. No murmur heard. No friction rub. No gallop.   Pulmonary:     Effort: Pulmonary effort is normal.     Breath sounds: Normal breath sounds. No wheezing, rhonchi or rales.  Chest:  Breasts:     Right: No axillary adenopathy or supraclavicular adenopathy.     Left: No axillary adenopathy or supraclavicular adenopathy.     Abdominal:     General: There is no distension.     Palpations: Abdomen is soft. There is no fluid wave, hepatomegaly, splenomegaly or mass.     Tenderness: There is no abdominal tenderness.     Comments:   Moderate ascites  Musculoskeletal:  General: Normal range of motion.     Cervical back: Normal range of motion and neck supple. No tenderness.     Right lower leg: No edema.     Left lower leg: No edema.  Lymphadenopathy:     Cervical: No cervical adenopathy.     Upper Body:     Right upper body: No supraclavicular or axillary adenopathy.     Left upper body: No supraclavicular or axillary adenopathy.     Lower Body: No right inguinal adenopathy. No left inguinal adenopathy.  Skin:    General: Skin is warm and dry.     Coloration: Skin is not jaundiced.     Findings: No rash.  Neurological:     Mental Status: She is alert and oriented to person, place, and time.     Cranial Nerves: No cranial nerve deficit.  Psychiatric:        Mood and Affect: Mood normal.        Behavior: Behavior normal.        Thought Content: Thought content normal.    LABS:   CBC Latest Ref Rng & Units 12/03/2020 11/26/2020 11/12/2020  WBC - 11.0 11.2 5.8  Hemoglobin 12.0 - 16.0 9.3(A) 9.6(A) 10.6(A)  Hematocrit 36 - 46 28(A) 30(A) 32(A)  Platelets 150 - 399 121(A) 161 152   CMP Latest Ref Rng & Units 12/03/2020 11/26/2020 11/12/2020  Glucose 70 - 99 mg/dL - - -  BUN 4 - 21 20 31(A) 18  Creatinine 0.5 - 1.1 0.9 0.9 1.0  Sodium 137 - 147 139 139 136(A)  Potassium 3.4 - 5.3 3.9 4.3 4.9  Chloride 99 - 108 109(A) 107 107  CO2 13 - 22 21 18 21   Calcium 8.7 - 10.7 9.1 9.4 9.5  Total Protein 6.0 - 8.3 g/dL - - -  Total Bilirubin 0.3 - 1.2 mg/dL - - -  Alkaline Phos 25 - 125 72 64 71  AST 13 - 35 43(A) 23 50(A)  ALT 7 - 35 30 22 26    Ammonia is 35   No results found for: CEA1 / No results found for: CEA1 No results found for: PSA1 No results found for: [id number] No results found for: [id number]   No results found for: TOTALPROTELP, ALBUMINELP, A1GS, A2GS, BETS, BETA2SER, GAMS, MSPIKE, SPEI Lab Results  Component Value Date   TIBC 483 (H) 10/22/2020   TIBC 476 08/26/2020   TIBC 318 05/25/2020   FERRITIN 10 (L) 11/26/2020   FERRITIN 38 10/22/2020   FERRITIN 6.4 08/26/2020   IRONPCTSAT 17 10/22/2020   IRONPCTSAT 5.2 08/26/2020   IRONPCTSAT 25 05/25/2020   No results found for: LDH  STUDIES:  No results found.    HISTORY:   Past Medical History:  Diagnosis Date  . Anemia 07/02/2018  . Aortic stenosis, mild 09/03/2018  . Cirrhosis of liver without ascites ([place]) 07/03/2019  . Diabetes mellitus due to underlying condition with unspecified complications ([place]) [id number]  . Diabetes mellitus without complication ([place])   . Essential hypertension 07/02/2018  . Hematochezia 06/20/2016  . History of colonic polyps 02/18/2019  . Hyperlipidemia   . Hypertension   . Iron deficiency anemia 06/20/2016  . Iron deficiency anemia, unspecified   . RUQ pain 06/20/2016  . Tightness in chest 09/03/2018    Past Surgical History:  Procedure Laterality Date  . ABDOMINAL HYSTERECTOMY    . BACK SURGERY      Family History  Problem Relation Age of Onset  . Heart disease  Mother   . Alzheimer's disease Mother   . Cancer Father   . Lymphoma Father   . Parkinson's disease Paternal Grandmother   . Kidney cancer Paternal Uncle     Social History:  reports that she has quit smoking. She has never used smokeless tobacco. She reports that she does not drink alcohol and does not use drugs.The patient is alone today.  Allergies: No Known Allergies  Current Medications: Current Outpatient Medications  Medication Sig Dispense Refill  . albuterol (VENTOLIN HFA) 108 (90 Base) MCG/ACT inhaler Inhale into the lungs.    . colesevelam (WELCHOL) 625 MG tablet Take 1,875 mg by mouth 2 (two) times daily.    . furosemide (LASIX) 40 MG tablet Take 1 tablet by mouth daily.    . insulin aspart (NOVOLOG)  100 UNIT/ML FlexPen Inject 30 Units into the skin 2 (two) times daily.    Marland Kitchen lactulose (CHRONULAC) 10 GM/15ML solution Take 20 g by mouth 1 day or 1 dose.  5  . LEVEMIR FLEXTOUCH 100 UNIT/ML Pen Inject 40 Units into the skin 2 (two) times daily.  12  . levofloxacin (LEVAQUIN) 750 MG tablet Take 750 mg by mouth daily.    Marland Kitchen levothyroxine (SYNTHROID) 50 MCG tablet Take 50 mcg by mouth daily.    Marland Kitchen liraglutide (VICTOZA) 18 MG/3ML SOPN Inject 1.8 mg into the skin daily.     Marland Kitchen lisinopril (PRINIVIL,ZESTRIL) 40 MG tablet Take 1 tablet by mouth daily.  3  . metFORMIN (GLUCOPHAGE-XR) 500 MG 24 hr tablet Take 500 mg by mouth 2 (two) times daily with a meal.  5  . montelukast (SINGULAIR) 10 MG tablet Take 10 mg by mouth at bedtime.    . nitrofurantoin, macrocrystal-monohydrate, (MACROBID) 100 MG capsule Take 1 capsule (100 mg total) by mouth 2 (two) times daily. 14 capsule 0  . nystatin (MYCOSTATIN/NYSTOP) powder     . omeprazole (PRILOSEC) 20 MG capsule Take 1 capsule by mouth daily.  3  . ondansetron (ZOFRAN) 4 MG tablet Take 1 tablet (4 mg total) by mouth every 4 (four) hours as needed for nausea. 90 tablet 3  . oxyCODONE-acetaminophen (PERCOCET/ROXICET) 5-325 MG tablet Take 1 tablet by mouth every 4 (four) hours.    . pravastatin (PRAVACHOL) 20 MG tablet Take 1 tablet by mouth once a week.    . pregabalin (LYRICA) 75 MG capsule Take 75 mg by mouth at bedtime.  1  . promethazine-codeine (PHENERGAN WITH CODEINE) 6.25-10 MG/5ML syrup Take 5 mLs by mouth every 6 (six) hours as needed.    Marland Kitchen rOPINIRole (REQUIP) 2 MG tablet Take 1 tablet by mouth at bedtime.  3  . spironolactone (ALDACTONE) 50 MG tablet Take 1 tablet (50 mg total) by mouth 2 (two) times daily. 60 tablet 5  . valACYclovir (VALTREX) 1000 MG tablet Take 1,000 mg by mouth 2 (two) times daily.    . Vitamin D, Ergocalciferol, (DRISDOL) 1.25 MG (50000 UT) CAPS capsule Take 1 capsule by mouth once a week.     No current facility-administered  medications for this visit.     ASSESSMENT & PLAN:   Assessment:  1. Iron deficiency anemia, secondary to chronic GI blood loss from AVM's, and intermittent epistaxis.  She has required regular IV iron replacement and transfusions, so we want to pursue any possible treatment for her AVM's.  She had evidence of recurrent iron deficiency, so is receiving IV Feraheme again.  Her hemoglobin is fairly stable at 9.3.  2. Non-alcoholic liver cirrhosis, with  intermittent mild thrombocytopenia.  Her anemia is also likely partly related to the cirrhosis.  3. Ascites, for which she is on spironolactone and furosemide. Her ascites has worsened since decreasing her spironolactone to 25 mg daily. I will have her increase this to 50 mg daily.  4. Hepatic encephalopathy, she has only been taking lactulose daily and her ammonia level has increased again. We will advise her to resume lactulose twice daily.  5. Multiple AVM's of the small bowel seen on capsule endoscopy in [place], treated at [facility] in February 2021.  She is scheduled to see Dr. Malissa Hippo at [facility] on May 27th.  6. Hypoglycemia, the patient had already left the clinic, so we contacted her and asked her to drink something sugary. Recommend she follow up with her PCP regarding this because she is on multiple medications for diabetes.   Plan:   She will increase her lactulose to twice daily and spironolactone to 50 mg daily.  She will receive IV Feraheme again on May 17th.  I will plan to see her back in 2 weeks with a CBC, comprehensive metabolic panel and ammonia level for repeat clinical assessment.     Marvia Pickles, PA-C

## 2020-12-08 ENCOUNTER — Other Ambulatory Visit: Payer: Self-pay

## 2020-12-08 ENCOUNTER — Inpatient Hospital Stay: Payer: Medicare Other

## 2020-12-08 DIAGNOSIS — Z87891 Personal history of nicotine dependence: Secondary | ICD-10-CM | POA: Diagnosis not present

## 2020-12-08 DIAGNOSIS — D509 Iron deficiency anemia, unspecified: Secondary | ICD-10-CM

## 2020-12-08 DIAGNOSIS — D696 Thrombocytopenia, unspecified: Secondary | ICD-10-CM | POA: Diagnosis not present

## 2020-12-08 DIAGNOSIS — D500 Iron deficiency anemia secondary to blood loss (chronic): Secondary | ICD-10-CM

## 2020-12-08 DIAGNOSIS — I10 Essential (primary) hypertension: Secondary | ICD-10-CM | POA: Diagnosis not present

## 2020-12-08 DIAGNOSIS — J449 Chronic obstructive pulmonary disease, unspecified: Secondary | ICD-10-CM | POA: Diagnosis not present

## 2020-12-08 DIAGNOSIS — E119 Type 2 diabetes mellitus without complications: Secondary | ICD-10-CM | POA: Diagnosis not present

## 2020-12-08 MED ORDER — SODIUM CHLORIDE 0.9 % IV SOLN
510.0000 mg | Freq: Once | INTRAVENOUS | Status: AC
  Administered 2020-12-08: 510 mg via INTRAVENOUS
  Filled 2020-12-08: qty 17

## 2020-12-08 MED ORDER — SODIUM CHLORIDE 0.9 % IV SOLN
Freq: Once | INTRAVENOUS | Status: AC
  Filled 2020-12-08: qty 250

## 2020-12-08 NOTE — Patient Instructions (Signed)
Ferumoxytol injection What is this medicine? FERUMOXYTOL is an iron complex. Iron is used to make healthy red blood cells, which carry oxygen and nutrients throughout the body. This medicine is used to treat iron deficiency anemia. This medicine may be used for other purposes; ask your health care provider or pharmacist if you have questions. COMMON BRAND NAME(S): Feraheme What should I tell my health care provider before I take this medicine? They need to know if you have any of these conditions:  anemia not caused by low iron levels  high levels of iron in the blood  magnetic resonance imaging (MRI) test scheduled  an unusual or allergic reaction to iron, other medicines, foods, dyes, or preservatives  pregnant or trying to get pregnant  breast-feeding How should I use this medicine? This medicine is for injection into a vein. It is given by a health care professional in a hospital or clinic setting. Talk to your pediatrician regarding the use of this medicine in children. Special care may be needed. Overdosage: If you think you have taken too much of this medicine contact a poison control center or emergency room at once. NOTE: This medicine is only for you. Do not share this medicine with others. What if I miss a dose? It is important not to miss your dose. Call your doctor or health care professional if you are unable to keep an appointment. What may interact with this medicine? This medicine may interact with the following medications:  other iron products This list may not describe all possible interactions. Give your health care provider a list of all the medicines, herbs, non-prescription drugs, or dietary supplements you use. Also tell them if you smoke, drink alcohol, or use illegal drugs. Some items may interact with your medicine. What should I watch for while using this medicine? Visit your doctor or healthcare professional regularly. Tell your doctor or healthcare  professional if your symptoms do not start to get better or if they get worse. You may need blood work done while you are taking this medicine. You may need to follow a special diet. Talk to your doctor. Foods that contain iron include: whole grains/cereals, dried fruits, beans, or peas, leafy green vegetables, and organ meats (liver, kidney). What side effects may I notice from receiving this medicine? Side effects that you should report to your doctor or health care professional as soon as possible:  allergic reactions like skin rash, itching or hives, swelling of the face, lips, or tongue  breathing problems  changes in blood pressure  feeling faint or lightheaded, falls  fever or chills  flushing, sweating, or hot feelings  swelling of the ankles or feet Side effects that usually do not require medical attention (report to your doctor or health care professional if they continue or are bothersome):  diarrhea  headache  nausea, vomiting  stomach pain This list may not describe all possible side effects. Call your doctor for medical advice about side effects. You may report side effects to FDA at 1-800-FDA-1088. Where should I keep my medicine? This drug is given in a hospital or clinic and will not be stored at home. NOTE: This sheet is a summary. It may not cover all possible information. If you have questions about this medicine, talk to your doctor, pharmacist, or health care provider.  2021 Elsevier/Gold Standard (2016-08-29 20:21:10)  

## 2020-12-10 DIAGNOSIS — E1165 Type 2 diabetes mellitus with hyperglycemia: Secondary | ICD-10-CM | POA: Diagnosis not present

## 2020-12-10 DIAGNOSIS — E039 Hypothyroidism, unspecified: Secondary | ICD-10-CM | POA: Diagnosis not present

## 2020-12-10 DIAGNOSIS — D509 Iron deficiency anemia, unspecified: Secondary | ICD-10-CM | POA: Diagnosis not present

## 2020-12-10 DIAGNOSIS — E559 Vitamin D deficiency, unspecified: Secondary | ICD-10-CM | POA: Diagnosis not present

## 2020-12-10 DIAGNOSIS — E785 Hyperlipidemia, unspecified: Secondary | ICD-10-CM | POA: Diagnosis not present

## 2020-12-10 DIAGNOSIS — M810 Age-related osteoporosis without current pathological fracture: Secondary | ICD-10-CM | POA: Diagnosis not present

## 2020-12-10 DIAGNOSIS — E1129 Type 2 diabetes mellitus with other diabetic kidney complication: Secondary | ICD-10-CM | POA: Diagnosis not present

## 2020-12-10 DIAGNOSIS — I10 Essential (primary) hypertension: Secondary | ICD-10-CM | POA: Diagnosis not present

## 2020-12-10 DIAGNOSIS — Z139 Encounter for screening, unspecified: Secondary | ICD-10-CM | POA: Diagnosis not present

## 2020-12-11 ENCOUNTER — Encounter: Payer: Self-pay | Admitting: Hematology and Oncology

## 2020-12-17 ENCOUNTER — Inpatient Hospital Stay (INDEPENDENT_AMBULATORY_CARE_PROVIDER_SITE_OTHER): Payer: Medicare Other | Admitting: Hematology and Oncology

## 2020-12-17 ENCOUNTER — Telehealth: Payer: Self-pay | Admitting: Hematology and Oncology

## 2020-12-17 ENCOUNTER — Encounter: Payer: Self-pay | Admitting: Hematology and Oncology

## 2020-12-17 ENCOUNTER — Inpatient Hospital Stay: Payer: Medicare Other

## 2020-12-17 ENCOUNTER — Other Ambulatory Visit: Payer: Self-pay

## 2020-12-17 VITALS — BP 147/65 | HR 84 | Temp 98.2°F | Resp 18 | Ht 63.0 in | Wt 169.1 lb

## 2020-12-17 DIAGNOSIS — D500 Iron deficiency anemia secondary to blood loss (chronic): Secondary | ICD-10-CM

## 2020-12-17 DIAGNOSIS — K7581 Nonalcoholic steatohepatitis (NASH): Secondary | ICD-10-CM | POA: Diagnosis not present

## 2020-12-17 DIAGNOSIS — D509 Iron deficiency anemia, unspecified: Secondary | ICD-10-CM | POA: Diagnosis not present

## 2020-12-17 DIAGNOSIS — K7460 Unspecified cirrhosis of liver: Secondary | ICD-10-CM

## 2020-12-17 DIAGNOSIS — D649 Anemia, unspecified: Secondary | ICD-10-CM | POA: Diagnosis not present

## 2020-12-17 LAB — BASIC METABOLIC PANEL
BUN: 9 (ref 4–21)
CO2: 27 — AB (ref 13–22)
Chloride: 110 — AB (ref 99–108)
Creatinine: 0.8 (ref 0.5–1.1)
Glucose: 171
Potassium: 4.2 (ref 3.4–5.3)
Sodium: 139 (ref 137–147)

## 2020-12-17 LAB — HEPATIC FUNCTION PANEL
ALT: 27 (ref 7–35)
AST: 34 (ref 13–35)
Alkaline Phosphatase: 76 (ref 25–125)
Bilirubin, Total: 0.7

## 2020-12-17 LAB — CBC AND DIFFERENTIAL
HCT: 30 — AB (ref 36–46)
Hemoglobin: 9.7 — AB (ref 12.0–16.0)
Neutrophils Absolute: 3.35
Platelets: 99 — AB (ref 150–399)
WBC: 5

## 2020-12-17 LAB — CBC: RBC: 2.86 — AB (ref 3.87–5.11)

## 2020-12-17 LAB — COMPREHENSIVE METABOLIC PANEL
Albumin: 3.8 (ref 3.5–5.0)
Calcium: 9.1 (ref 8.7–10.7)

## 2020-12-17 NOTE — Telephone Encounter (Signed)
Per 5/26 LOS patient scheduled for June Appt's.  Gave patient Appt Summary

## 2020-12-17 NOTE — Progress Notes (Signed)
[facility]  [address] [place],  [place]  [address] [phone]  Clinic Day:  12/17/2020  Referring physician: Nicoletta Dress, MD   CHIEF COMPLAINT:  CC:  Iron deficiency anemia  Current Treatment:   IV iron as needed   HISTORY OF PRESENT ILLNESS:  [name] is a 70 y.o. female  who we began seeing in December 2019 for iron deficiency anemia. In November 2018, colonoscopy revealed tubular adenomas.  Upper endoscopy in January 2018 did not reveal any specific findings.  She was clearly iron deficient.  She has multiple comorbidities, including diabetes, liver cirrhosis, COPD, hyperlipidemia, and degenerative disc disease.  Testing for any other deficiency or monoclonal spike was negative. She had already been on oral supplement for nearly 1 year with lack of response, so was given IV iron in the form of Feraheme.  She felt somewhat better after the IV iron, but still complained of fatigue and weakness.  She received IV Feraheme again in June.  Her hemoglobin improved, but was still low at 11.0.   Repeat colonoscopy and EGD were done in August 2020 by Dr. Noberto Retort.  Biopsy was negative.  In September 2020,her hemoglobin had dropped from 9.8 to 9.0 at Dr. Denton Lank office.  When we saw her later in September, the hemoglobin had dropped down to 7.5.  She was transfused with 2 units of packed red blood cells.  Repeat iron studies once again revealed iron deficiency, so was given IV Feraheme.  CT abdomen and pelvis was negative.  She felt so much better after the transfusion that she jumped out of bed and fractured her right foot in a fall.  She had a drop in her hemoglobin again in November and was given IV Feraheme again.  Capsule endoscopy in November 2020 in [facility] revealed multiple AVM's of the small bowel.    We continued to follow her periodically. She had cauterization of an AVM of the small bowel at [facility] in February 2021. In  March 2021, her hemoglobin dropped back down 7.6.  She therefore received IV Feraheme again in early April.  Abdominal ultrasound in May revealed cirrhosis of the liver with nodular surface, increased echogenicity and heterogeneity.  No focal liver parenchymal lesion was observed.  She has had intermittent mild thrombocytopenia, presumably from her liver cirrhosis. She received IV Feraheme in early June, but only one dose due to her leaving town.  She also received 2 units of packed red blood cells at that time. She presented to the [facility] emergency department later in June due to generalized weakness that had been ongoing for 1 week, as well as altered mental status, and was admitted.  She had just returned from a trip to [place].  While out of town, she developed severe epistaxis that required packing and 1 unit of packed red blood cells.  CT imaging revealed small amount of blood in the right maxillary and sphenoid sinuses with tube placed in the right paranasal sinus.  She was found to have a UTI.    She developed a severe epistaxis requiring hospital admission in late July 2021 and had a nasal bullet in place.  Her hemoglobin dropped down to 6.9, so she has been transfused, and given a dose of IV Feraheme while in the hospital.  She had an episode of hypotension and fever and was felt to be septic, so was transferred to the ICU.  She was placed on broad-spectrum IV antibiotics,  and cultures remained negative.  Echocardiogram in July showed a suspicious mass in the right atrium, which may be attached to the interatrial septum.  The cardiologist reviewed the echocardiogram and felt that this was likely an artifact and not a mass, vegetation or thrombus.  The patient continued to complain of pain from the compression device in her nose and described some green drainage from her sinuses, potentially the source of her fever and sepsis.  Her hemoglobin was 8.7 at the time of discharge in July.  She was seen in  August and reported weakness of her hands and and shakiness of her arms and legs.  She was occasionally dropping objects.  She also reported fatigue, dyspnea with exertion and moderate abdominal swelling.  She had a recent follow-up at Dr. Julien Nordmann office with a good cardiac report.  Due to the asterixis, an ammonia level was obtained, and was elevated at 62.  She was therefore placed on lactulose 30 mL twice a day. She was seen a week later with some improvement, but her serum ammonia was still 54, so the lactulose was increased to TID.  She also had hyperkalemia, so her spironolactone 25 mg was held. We then resumed one pill daily along with Lasix 40 mg daily due to recurrent ascites.  She was transfused with 2 units of PRBCs again at the end of September when her hemoglobin dropped down to 7.3.  Iron level is 13.0 with a TIBC of 415 for a saturation of 3.1%. She was scheduled for 2 more doses of IV Feraheme.  She was admitted in October  with a fever of 105 and had E coli sepsis. Lactulose was placed on hold due to severe diarrhea, adding to her dehydration. Her serum ammonia came down to 9, so we did not resume lactulose.  She did have a new splenic infarction as of this admission, which was causing a lot of left abdominal pain.  Her spironolactone was increased to 50 mg to take 1 to 2 daily.  At her visit in November, she says she was placed back on lactulose daily.  When she was seen in early February, her CBC reveals a hemoglobin of 7.6 with normal ANC and platelets.  She was therefore transfused 2 units of PRBCs. Iron studies revealed recurrent iron deficiency.. Ammonia level was elevated at 35,  so her lactulose was increased to 30 cc twice daily.  She was scheduled for IV iron.  She continued to have blood loss with black stools and one episode of moderate epistaxis,  Shehas been very frustrated by the constant recurrent bleeding, which we think is from the AVM's of the small bowel. She would like to  pursue any possible procedures to address that and is scheduled to see the gastroenterologist at [facility] on May 27th.   At her visit on April 21st, she had worsening anemia with a hemoglobin of 10.6.  She was dehydrated, so was given IV fluids..  She reported dysuria, but urine culture was negative.  We recommended closer follow-up. She was seen again on May 5th, at which time her hemoglobin had decreased to 9.6.  Her ferritin was 10, which is consistent with recurrent iron deficiency.  So, I recommended treatment with IV Feraheme again. She was once again dehydrated , so received IV fluids.  As she was dehydrated on furosemide daily and spironolactone twice daily, so I had her decrease her spironolactone to once a day. The ammonia level was normal. She was on treatment for  bronchitis, with levofloxacin, as well as an inhaler. She received her 1st dose of Feraheme on May 10th.  Due to the severe anemia, we have been following her closely.   At her visit on May 12th, she had gained a significant amount of weight.  She also had increased fatigue and sleepiness.  She states she is taking spironolactone 25 mg daily, but had been taking 50 mg twice daily. There was confusion about the dosage, as I thought she only had 50 mg tablets at home, but she also had 25 mg tablets.  She states she is now only taking lactulose daily. I had her increase the spironolactone to 50 mg daily and the lactulose to twice daily.  She is seeing the gastroenterologist at [facility]  INTERVAL HISTORY:  Cherell is here today for repeat clinical assessment. She remains fatigued and reports shortness of breath with exertion.  She reports dark stool earlier in the week, but now it appears normal.  She had a mild nosebleed this morning which stopped quickly.  She states her abdominal discomfort has improved with increasing the spironolactone to 50 mg daily She denies fevers or chills. She denies pain. Her appetite is fair. She has been drinking  more fluids and eating popsicles. Her weight has decreased 2 pounds over last 2 weeks.   REVIEW OF SYSTEMS:  Review of Systems  Constitutional: Positive for fatigue. Negative for appetite change, chills, fever and unexpected weight change.  HENT:   Positive for nosebleeds (Single episode this morning). Negative for lump/mass, mouth sores and sore throat.   Respiratory: Positive for shortness of breath (With exertion). Negative for cough.   Cardiovascular: Negative for chest pain and leg swelling.  Gastrointestinal: Negative for abdominal distention, abdominal pain, blood in stool, constipation, diarrhea, nausea and vomiting.  Endocrine: Negative for hot flashes.  Genitourinary: Negative for difficulty urinating, dysuria, frequency, hematuria, vaginal bleeding and vaginal discharge.   Musculoskeletal: Negative for arthralgias, back pain and myalgias.  Skin: Negative for rash.  Neurological: Negative for dizziness and headaches.  Hematological: Negative for adenopathy. Does not bruise/bleed easily.  Psychiatric/Behavioral: Negative for depression and sleep disturbance. The patient is not nervous/anxious.    VITALS:  Blood pressure (!) 147/65, pulse 84, temperature 98.2 F (36.8 C), temperature source Oral, resp. rate 18, height 5\' 3"  (1.6 m), weight 169 lb 1.6 oz (76.7 kg), SpO2 97 %.  Wt Readings from Last 3 Encounters:  12/17/20 169 lb 1.6 oz (76.7 kg)  12/03/20 171 lb (77.6 kg)  12/01/20 166 lb (75.3 kg)    Body mass index is 29.95 kg/m.  Performance status (ECOG): 1 - Symptomatic but completely ambulatory  PHYSICAL EXAM:  Physical Exam Vitals and nursing note reviewed.  Constitutional:      General: She is not in acute distress.    Appearance: Normal appearance.  HENT:     Head: Normocephalic and atraumatic.     Nose:     Comments:  There are blood clots within the bilateral nasal canals without active bleeding of the nasal mucosa    Mouth/Throat:     Mouth: Mucous  membranes are moist.     Pharynx: Oropharynx is clear. No oropharyngeal exudate or posterior oropharyngeal erythema.  Eyes:     General: No scleral icterus.    Extraocular Movements: Extraocular movements intact.     Conjunctiva/sclera: Conjunctivae normal.     Pupils: Pupils are equal, round, and reactive to light.  Cardiovascular:     Rate and Rhythm: Normal  rate and regular rhythm.     Heart sounds: Normal heart sounds. No murmur heard. No friction rub. No gallop.   Pulmonary:     Effort: Pulmonary effort is normal.     Breath sounds: Normal breath sounds. No wheezing, rhonchi or rales.  Chest:  Breasts:     Right: No axillary adenopathy or supraclavicular adenopathy.     Left: No axillary adenopathy or supraclavicular adenopathy.    Abdominal:     General: There is no distension.     Palpations: Abdomen is soft. There is hepatomegaly (The left lobe of the liver is enlarged). There is no fluid wave, splenomegaly or mass.     Tenderness: There is no abdominal tenderness.  Musculoskeletal:        General: Normal range of motion.     Cervical back: Normal range of motion and neck supple. No tenderness.     Right lower leg: No edema.     Left lower leg: No edema.  Lymphadenopathy:     Cervical: No cervical adenopathy.     Upper Body:     Right upper body: No supraclavicular or axillary adenopathy.     Left upper body: No supraclavicular or axillary adenopathy.     Lower Body: No right inguinal adenopathy. No left inguinal adenopathy.  Skin:    General: Skin is warm and dry.     Coloration: Skin is not jaundiced.     Findings: No rash.  Neurological:     Mental Status: She is alert and oriented to person, place, and time.     Cranial Nerves: No cranial nerve deficit.  Psychiatric:        Mood and Affect: Mood normal.        Behavior: Behavior normal.        Thought Content: Thought content normal.    LABS:   CBC Latest Ref Rng & Units 12/17/2020 12/03/2020 11/26/2020  WBC  - 5.0 11.0 11.2  Hemoglobin 12.0 - 16.0 9.7(A) 9.3(A) 9.6(A)  Hematocrit 36 - 46 30(A) 28(A) 30(A)  Platelets 150 - 399 99(A) 121(A) 161   CMP Latest Ref Rng & Units 12/17/2020 12/03/2020 11/26/2020  Glucose 70 - 99 mg/dL - - -  BUN 4 - 21 9 20  31(A)  Creatinine 0.5 - 1.1 0.8 0.9 0.9  Sodium 137 - 147 139 139 139  Potassium 3.4 - 5.3 4.2 3.9 4.3  Chloride 99 - 108 110(A) 109(A) 107  CO2 13 - 22 27(A) 21 18  Calcium 8.7 - 10.7 9.1 9.1 9.4  Total Protein 6.0 - 8.3 g/dL - - -  Total Bilirubin 0.3 - 1.2 mg/dL - - -  Alkaline Phos 25 - 125 76 72 64  AST 13 - 35 34 43(A) 23  ALT 7 - 35 27 30 22    Ammonia is 35   No results found for: CEA1 / No results found for: CEA1 No results found for: PSA1 No results found for: GUR427 No results found for: CWC376  No results found for: TOTALPROTELP, ALBUMINELP, A1GS, A2GS, BETS, BETA2SER, GAMS, MSPIKE, SPEI Lab Results  Component Value Date   TIBC 483 (H) 10/22/2020   TIBC 476 08/26/2020   TIBC 318 05/25/2020   FERRITIN 10 (L) 11/26/2020   FERRITIN 38 10/22/2020   FERRITIN 6.4 08/26/2020   IRONPCTSAT 17 10/22/2020   IRONPCTSAT 5.2 08/26/2020   IRONPCTSAT 25 05/25/2020   No results found for: LDH  STUDIES:  No results found.    HISTORY:  Past Medical History:  Diagnosis Date  . Anemia 07/02/2018  . Aortic stenosis, mild 09/03/2018  . Cirrhosis of liver without ascites ([place]) 07/03/2019  . Diabetes mellitus due to underlying condition with unspecified complications ([place]) 63/02/4664  . Diabetes mellitus without complication ([place])   . Essential hypertension 07/02/2018  . Hematochezia 06/20/2016  . History of colonic polyps 02/18/2019  . Hyperlipidemia   . Hypertension   . Iron deficiency anemia 06/20/2016  . Iron deficiency anemia, unspecified   . RUQ pain 06/20/2016  . Tightness in chest 09/03/2018    Past Surgical History:  Procedure Laterality Date  . ABDOMINAL HYSTERECTOMY    . BACK SURGERY      Family History  Problem  Relation Age of Onset  . Heart disease Mother   . Alzheimer's disease Mother   . Cancer Father   . Lymphoma Father   . Parkinson's disease Paternal Grandmother   . Kidney cancer Paternal Uncle     Social History:  reports that she has quit smoking. She has never used smokeless tobacco. She reports that she does not drink alcohol and does not use drugs.The patient is alone today.  Allergies: No Known Allergies  Current Medications: Current Outpatient Medications  Medication Sig Dispense Refill  . albuterol (VENTOLIN HFA) 108 (90 Base) MCG/ACT inhaler Inhale into the lungs.    . colesevelam (WELCHOL) 625 MG tablet Take 1,875 mg by mouth 2 (two) times daily.    . furosemide (LASIX) 40 MG tablet Take 1 tablet by mouth daily.    . insulin aspart (NOVOLOG) 100 UNIT/ML FlexPen Inject 30 Units into the skin 2 (two) times daily.    Marland Kitchen lactulose (CHRONULAC) 10 GM/15ML solution Take 20 g by mouth 1 day or 1 dose.  5  . LEVEMIR FLEXTOUCH 100 UNIT/ML Pen Inject 40 Units into the skin 2 (two) times daily.  12  . levofloxacin (LEVAQUIN) 750 MG tablet Take 750 mg by mouth daily.    Marland Kitchen levothyroxine (SYNTHROID) 50 MCG tablet Take 50 mcg by mouth daily.    Marland Kitchen liraglutide (VICTOZA) 18 MG/3ML SOPN Inject 1.8 mg into the skin daily.     Marland Kitchen lisinopril (PRINIVIL,ZESTRIL) 40 MG tablet Take 1 tablet by mouth daily.  3  . metFORMIN (GLUCOPHAGE-XR) 500 MG 24 hr tablet Take 500 mg by mouth 2 (two) times daily with a meal.  5  . montelukast (SINGULAIR) 10 MG tablet Take 10 mg by mouth at bedtime.    . nitrofurantoin, macrocrystal-monohydrate, (MACROBID) 100 MG capsule Take 1 capsule (100 mg total) by mouth 2 (two) times daily. 14 capsule 0  . nystatin (MYCOSTATIN/NYSTOP) powder     . omeprazole (PRILOSEC) 20 MG capsule Take 1 capsule by mouth daily.  3  . ondansetron (ZOFRAN) 4 MG tablet Take 1 tablet (4 mg total) by mouth every 4 (four) hours as needed for nausea. 90 tablet 3  . oxyCODONE-acetaminophen  (PERCOCET/ROXICET) 5-325 MG tablet Take 1 tablet by mouth every 4 (four) hours.    . pravastatin (PRAVACHOL) 20 MG tablet Take 1 tablet by mouth once a week.    . pregabalin (LYRICA) 75 MG capsule Take 75 mg by mouth at bedtime.  1  . promethazine-codeine (PHENERGAN WITH CODEINE) 6.25-10 MG/5ML syrup Take 5 mLs by mouth every 6 (six) hours as needed.    Marland Kitchen rOPINIRole (REQUIP) 2 MG tablet Take 1 tablet by mouth at bedtime.  3  . spironolactone (ALDACTONE) 50 MG tablet Take 1 tablet (50 mg total) by mouth  2 (two) times daily. 60 tablet 5  . valACYclovir (VALTREX) 1000 MG tablet Take 1,000 mg by mouth 2 (two) times daily.    . Vitamin D, Ergocalciferol, (DRISDOL) 1.25 MG (50000 UT) CAPS capsule Take 1 capsule by mouth once a week.     No current facility-administered medications for this visit.     ASSESSMENT & PLAN:   Assessment:  1. Iron deficiency anemia, secondary to chronic GI blood loss from AVM's, and intermittent epistaxis.  She has required regular IV iron replacement and transfusions, so we want to pursue any possible treatment for her AVM's.  She had evidence of recurrent iron deficiency, so received IV Feraheme again this month.  Her hemoglobin is stable at 9.7.  2. Non-alcoholic liver cirrhosis, with intermittent mild thrombocytopenia.  Her anemia is also likely partly related to the cirrhosis.  3. Ascites, for which she is on spironolactone and furosemide. Her ascites has improved with increasing spironolactone to 50 mg daily.  4. Hepatic encephalopathy, her ammonia is back in normal range with increasing the lactulose to twice daily. I advised her to continue this to avoid her ammonia level going up again and causing her symptoms.  5. Multiple AVM's of the small bowel seen on capsule endoscopy in [place], treated at [facility] in February 2021.  She is scheduled to see Dr. Malissa Hippo at [facility] on May 27th.  6. Hypoglycemia, the patient had already left the clinic,  so we contacted her and asked her to drink something sugary. Recommend she follow up with her PCP regarding this because she is on multiple medications for diabetes.   Plan:    Her hemoglobin is stable today.  She knows to continue spironolactone 50 mg daily and furosemide 40 mg twice daily, as well as lactulose twice daily She will see a gastroenterologist at [facility] tomorrow for possible treatment of her AVMs. I will plan to see her back in 4 weeks with a CBC, comprehensive metabolic panel, ammonia level, iron panel and ferritin for repeat clinical assessment.  The patient understands the plans discussed today and is in agreement with them. She knows to contact us if she develops symptoms of worsening anemia prior to that visit.     Marvia Pickles, PA-C

## 2020-12-18 DIAGNOSIS — K31819 Angiodysplasia of stomach and duodenum without bleeding: Secondary | ICD-10-CM | POA: Diagnosis not present

## 2020-12-18 DIAGNOSIS — D500 Iron deficiency anemia secondary to blood loss (chronic): Secondary | ICD-10-CM | POA: Diagnosis not present

## 2020-12-18 DIAGNOSIS — K7469 Other cirrhosis of liver: Secondary | ICD-10-CM | POA: Diagnosis not present

## 2020-12-28 ENCOUNTER — Encounter: Payer: Self-pay | Admitting: Hematology and Oncology

## 2021-01-02 ENCOUNTER — Other Ambulatory Visit: Payer: Self-pay | Admitting: Oncology

## 2021-01-02 DIAGNOSIS — R188 Other ascites: Secondary | ICD-10-CM

## 2021-01-04 ENCOUNTER — Encounter: Payer: Self-pay | Admitting: Hematology and Oncology

## 2021-01-12 ENCOUNTER — Inpatient Hospital Stay (INDEPENDENT_AMBULATORY_CARE_PROVIDER_SITE_OTHER): Payer: Medicare Other | Admitting: Hematology and Oncology

## 2021-01-12 ENCOUNTER — Encounter: Payer: Self-pay | Admitting: Hematology and Oncology

## 2021-01-12 ENCOUNTER — Telehealth: Payer: Self-pay | Admitting: Hematology and Oncology

## 2021-01-12 ENCOUNTER — Inpatient Hospital Stay: Payer: Medicare Other | Attending: Oncology

## 2021-01-12 ENCOUNTER — Other Ambulatory Visit: Payer: Self-pay

## 2021-01-12 VITALS — BP 157/71 | HR 84 | Temp 98.3°F | Resp 18 | Ht 63.0 in | Wt 169.1 lb

## 2021-01-12 DIAGNOSIS — D509 Iron deficiency anemia, unspecified: Secondary | ICD-10-CM | POA: Insufficient documentation

## 2021-01-12 DIAGNOSIS — K7290 Hepatic failure, unspecified without coma: Secondary | ICD-10-CM | POA: Insufficient documentation

## 2021-01-12 DIAGNOSIS — D649 Anemia, unspecified: Secondary | ICD-10-CM | POA: Diagnosis not present

## 2021-01-12 DIAGNOSIS — D500 Iron deficiency anemia secondary to blood loss (chronic): Secondary | ICD-10-CM

## 2021-01-12 DIAGNOSIS — R188 Other ascites: Secondary | ICD-10-CM | POA: Insufficient documentation

## 2021-01-12 DIAGNOSIS — K7460 Unspecified cirrhosis of liver: Secondary | ICD-10-CM | POA: Insufficient documentation

## 2021-01-12 DIAGNOSIS — E119 Type 2 diabetes mellitus without complications: Secondary | ICD-10-CM | POA: Insufficient documentation

## 2021-01-12 LAB — CBC AND DIFFERENTIAL
HCT: 30 — AB (ref 36–46)
Hemoglobin: 10 — AB (ref 12.0–16.0)
Neutrophils Absolute: 3.74
Platelets: 114 — AB (ref 150–399)
WBC: 5.2

## 2021-01-12 LAB — FERRITIN: Ferritin: 21 ng/mL (ref 11–307)

## 2021-01-12 LAB — BASIC METABOLIC PANEL
BUN: 13 (ref 4–21)
CO2: 27 — AB (ref 13–22)
Chloride: 105 (ref 99–108)
Creatinine: 0.7 (ref 0.5–1.1)
Glucose: 236
Potassium: 4 (ref 3.4–5.3)
Sodium: 137 (ref 137–147)

## 2021-01-12 LAB — IRON AND TIBC
Iron: 96 ug/dL (ref 28–170)
Saturation Ratios: 21 % (ref 10.4–31.8)
TIBC: 464 ug/dL — ABNORMAL HIGH (ref 250–450)
UIBC: 368 ug/dL

## 2021-01-12 LAB — COMPREHENSIVE METABOLIC PANEL
Albumin: 4 (ref 3.5–5.0)
Calcium: 9.6 (ref 8.7–10.7)

## 2021-01-12 LAB — HEPATIC FUNCTION PANEL
ALT: 24 (ref 7–35)
AST: 37 — AB (ref 13–35)
Alkaline Phosphatase: 58 (ref 25–125)
Bilirubin, Total: 1

## 2021-01-12 LAB — CBC: RBC: 2.92 — AB (ref 3.87–5.11)

## 2021-01-12 NOTE — Progress Notes (Signed)
[name]  [address] [name],  [name]  Clinic Day:  01/14/2021  Referring physician: Nicoletta Dress, MD   CHIEF COMPLAINT:  CC:  Iron deficiency anemia  Current Treatment:   IV iron as needed   HISTORY OF PRESENT ILLNESS:  [name] is a 70 y.o. female  who we began seeing in December 2019 for iron deficiency anemia. In November 2018, colonoscopy revealed tubular adenomas.  Upper endoscopy in January 2018 did not reveal any specific findings.  She was clearly iron deficient.  She has multiple comorbidities, including diabetes, liver cirrhosis, COPD, hyperlipidemia, and degenerative disc disease.  Testing for any other deficiency or monoclonal spike was negative. She had already been on oral supplement for nearly 1 year with lack of response, so was given IV iron in the form of Feraheme.  She felt somewhat better after the IV iron, but still complained of fatigue and weakness.  She received IV Feraheme again in June.  Her hemoglobin improved, but was still low at 11.0.   Repeat colonoscopy and EGD were done in August 2020 by Dr. Noberto Retort.  Biopsy was negative.  In September 2020,her hemoglobin had dropped from 9.8 to 9.0 at Dr. Denton Lank office.  When we saw her later in September, the hemoglobin had dropped down to 7.5.  She was transfused with 2 units of packed red blood cells.  Repeat iron studies once again revealed iron deficiency, so was given IV Feraheme.  CT abdomen and pelvis was negative.  She felt so much better after the transfusion that she jumped out of bed and fractured her right foot in a fall.  She had a drop in her hemoglobin again in November and was given IV Feraheme again.  Capsule endoscopy in November 2020 in [facility] revealed multiple AVM's of the small bowel.     We continued to follow her periodically. She had cauterization of an AVM of the small bowel at [facility] in February 2021. In  March 2021, her hemoglobin dropped back down 7.6.  She therefore received IV Feraheme again in early April.  Abdominal ultrasound in May revealed cirrhosis of the liver with nodular surface, increased echogenicity and heterogeneity.  No focal liver parenchymal lesion was observed.  She has had intermittent mild thrombocytopenia, presumably from her liver cirrhosis. She received IV Feraheme in early June, but only one dose due to her leaving town.  She also received 2 units of packed red blood cells at that time. She presented to the [facility] emergency department later in June due to generalized weakness that had been ongoing for 1 week, as well as altered mental status, and was admitted.  She had just returned from a trip to [place].  While out of town, she developed severe epistaxis that required packing and 1 unit of packed red blood cells.  CT imaging revealed small amount of blood in the right maxillary and sphenoid sinuses with tube placed in the right paranasal sinus.  She was found to have a UTI.     She developed a severe epistaxis requiring hospital admission in late July 2021 and had a nasal bullet in place.  Her hemoglobin dropped down to 6.9, so she has been transfused, and given a dose of IV Feraheme while in the hospital.  She had an episode of hypotension and fever and was felt to be septic, so was transferred to the ICU.  She was placed on broad-spectrum  IV antibiotics, and cultures remained negative.  Echocardiogram in July showed a suspicious mass in the right atrium, which may be attached to the interatrial septum.  The cardiologist reviewed the echocardiogram and felt that this was likely an artifact and not a mass, vegetation or thrombus.  The patient continued to complain of pain from the compression device in her nose and described some green drainage from her sinuses, potentially the source of her fever and sepsis.  Her hemoglobin was 8.7 at the time of discharge in July.  She was seen in  August and reported weakness of her hands and shakiness of her arms and legs.  She was occasionally dropping objects.  She also reported fatigue, dyspnea with exertion and moderate abdominal swelling.  She had a recent follow-up at Dr. Julien Nordmann office with a good cardiac report.  Due to the asterixis, an ammonia level was obtained, and was elevated at 62.  She was therefore placed on lactulose 30 mL twice a day. She was seen a week later with some improvement, but her serum ammonia was still 54, so the lactulose was increased to TID.  She also had hyperkalemia, so her spironolactone 25 mg was held. We then resumed one pill daily along with Lasix 40 mg daily due to recurrent ascites.  She was transfused with 2 units of PRBCs again at the end of September when her hemoglobin dropped down to 7.3.  Iron level is 13.0 with a TIBC of 415 for a saturation of 3.1%. She was scheduled for 2 more doses of IV Feraheme.  She was admitted in October  with a fever of 105 and had E coli sepsis. Lactulose was placed on hold due to severe diarrhea, adding to her dehydration. Her serum ammonia came down to 9, so we did not resume lactulose.  She did have a new splenic infarction as of this admission, which was causing a lot of left abdominal pain.  Her spironolactone was increased to 50 mg to take 1 to 2 daily.  At her visit in November, she says she was placed back on lactulose daily.  When she was seen in early February, her CBC reveals a hemoglobin of 7.6 with normal ANC and platelets.  She was therefore transfused 2 units of PRBCs. Iron studies revealed recurrent iron deficiency.. Ammonia level was elevated at 35,  so her lactulose was increased to 30 cc twice daily.  She was scheduled for IV iron.  She continued to have blood loss with black stools and one episode of moderate epistaxis,  She has been very frustrated by the constant recurrent bleeding, which we think is from the AVM's of the small bowel. She would like to  pursue any possible procedures to address that and is scheduled to see the gastroenterologist at [facility] on May 27th.   At her visit on April 21st, she had worsening anemia with a hemoglobin of 10.6.  She was dehydrated, so was given IV fluids..  She reported dysuria, but urine culture was negative.  We recommended closer follow-up. She was seen again on May 5th, at which time her hemoglobin had decreased to 9.6.  Her ferritin was 10, which is consistent with recurrent iron deficiency.  So, I recommended treatment with IV Feraheme again. She was once again dehydrated , so received IV fluids.  As she was dehydrated on furosemide daily and spironolactone twice daily, so I had her decrease her spironolactone to once a day. The ammonia level was normal. She was on  treatment for bronchitis, with levofloxacin, as well as an inhaler. She received her 1st dose of Feraheme on May 10th.  Due to the severe anemia, we have been following her closely.   At her visit on May 12th, she had gained a significant amount of weight.  She also had increased fatigue and sleepiness.  She states she is taking spironolactone 25 mg daily, but had been taking 50 mg twice daily. There was confusion about the dosage, as I thought she only had 50 mg tablets at home, but she also had 25 mg tablets.  She states she is now only taking lactulose daily. I had her increase the spironolactone to 50 mg daily and the lactulose to twice daily.   INTERVAL HISTORY:  [name] is here today for repeat clinical assessment.  Since her last visit, she saw Dr. Malissa Hippo, Gastroenterology at [facility]. Per his note, he was not convinced chronic GI blood loss was the cause of her anemia.  He planned additional evaluation and the patient states she is having another EGD Thursday. If this is nondiagnostic, she states he will proceed with further testing She continues to report intermittent epistaxis, but otherwise denies overt form of blood loss.  She denies progressive fatigue  concerning for worsening anemia.  She reports increased abdominal swelling. She continues furosemide and spironolactone as recommended. She denies fevers or chills. She denies pain. Her appetite is fair. Her weight has been stable.   REVIEW OF SYSTEMS:  Review of Systems  Constitutional:  Negative for appetite change, chills, fatigue, fever and unexpected weight change.  HENT:   Negative for lump/mass, mouth sores and sore throat.   Respiratory:  Negative for cough and shortness of breath.   Cardiovascular:  Negative for chest pain and leg swelling.  Gastrointestinal:  Negative for abdominal pain, constipation, diarrhea, nausea and vomiting.  Endocrine: Negative for hot flashes.  Genitourinary:  Negative for difficulty urinating, dysuria, frequency and hematuria.   Musculoskeletal:  Negative for arthralgias, back pain and myalgias.  Skin:  Negative for rash.  Neurological:  Negative for dizziness and headaches.  Hematological:  Negative for adenopathy. Does not bruise/bleed easily.  Psychiatric/Behavioral:  Negative for depression and sleep disturbance. The patient is not nervous/anxious.    VITALS:  Blood pressure (!) 157/71, pulse 84, temperature 98.3 F (36.8 C), temperature source Oral, resp. rate 18, height 5\' 3"  (1.6 m), weight 169 lb 1.6 oz (76.7 kg), SpO2 98 %.  Wt Readings from Last 3 Encounters:  01/12/21 169 lb 1.6 oz (76.7 kg)  12/17/20 169 lb 1.6 oz (76.7 kg)  12/03/20 171 lb (77.6 kg)    Body mass index is 29.95 kg/m.  Performance status (ECOG): 1 - Symptomatic but completely ambulatory  PHYSICAL EXAM:  Physical Exam Vitals and nursing note reviewed.  Constitutional:      General: She is not in acute distress.    Appearance: Normal appearance.  HENT:     Head: Normocephalic and atraumatic.     Mouth/Throat:     Mouth: Mucous membranes are moist.     Pharynx: Oropharynx is clear. No oropharyngeal exudate or posterior oropharyngeal erythema.  Eyes:     General:  No scleral icterus.    Extraocular Movements: Extraocular movements intact.     Conjunctiva/sclera: Conjunctivae normal.     Pupils: Pupils are equal, round, and reactive to light.  Cardiovascular:     Rate and Rhythm: Normal rate and regular rhythm.     Heart sounds: Normal heart sounds. No  murmur heard.   No friction rub. No gallop.  Pulmonary:     Effort: Pulmonary effort is normal.     Breath sounds: Normal breath sounds. No wheezing, rhonchi or rales.  Chest:  Breasts:    Right: No axillary adenopathy or supraclavicular adenopathy.     Left: No axillary adenopathy or supraclavicular adenopathy.  Abdominal:     General: There is no distension.     Palpations: Abdomen is soft. There is fluid wave and hepatomegaly (left lobe of the liver is enlarged, extending to midline). There is no splenomegaly or mass.     Tenderness: There is no abdominal tenderness.     Comments:   Moderate ascites  Musculoskeletal:        General: Normal range of motion.     Cervical back: Normal range of motion and neck supple. No tenderness.     Right lower leg: No edema.     Left lower leg: No edema.  Lymphadenopathy:     Cervical: No cervical adenopathy.     Upper Body:     Right upper body: No supraclavicular or axillary adenopathy.     Left upper body: No supraclavicular or axillary adenopathy.     Lower Body: No right inguinal adenopathy. No left inguinal adenopathy.  Skin:    General: Skin is warm and dry.     Coloration: Skin is not jaundiced.     Findings: No rash.  Neurological:     Mental Status: She is alert and oriented to person, place, and time.     Cranial Nerves: No cranial nerve deficit.  Psychiatric:        Mood and Affect: Mood normal.        Behavior: Behavior normal.        Thought Content: Thought content normal.   LABS:   CBC Latest Ref Rng & Units 01/12/2021 12/17/2020 12/03/2020  WBC - 5.2 5.0 11.0  Hemoglobin 12.0 - 16.0 10.0(A) 9.7(A) 9.3(A)  Hematocrit 36 - 46 30(A)  30(A) 28(A)  Platelets 150 - 399 114(A) 99(A) 121(A)   CMP Latest Ref Rng & Units 01/12/2021 12/17/2020 12/03/2020  Glucose 70 - 99 mg/dL - - -  BUN 4 - 21 13 9 20   Creatinine 0.5 - 1.1 0.7 0.8 0.9  Sodium 137 - 147 137 139 139  Potassium 3.4 - 5.3 4.0 4.2 3.9  Chloride 99 - 108 105 110(A) 109(A)  CO2 13 - 22 27(A) 27(A) 21  Calcium 8.7 - 10.7 9.6 9.1 9.1  Total Protein 6.0 - 8.3 g/dL - - -  Total Bilirubin 0.3 - 1.2 mg/dL - - -  Alkaline Phos 25 - 125 58 76 72  AST 13 - 35 37(A) 34 43(A)  ALT 7 - 35 24 27 30    Ammonia is 35   No results found for: CEA1 / No results found for: CEA1 No results found for: PSA1 No results found for: TLX726 No results found for: OMB559  No results found for: TOTALPROTELP, ALBUMINELP, A1GS, A2GS, BETS, BETA2SER, GAMS, MSPIKE, SPEI Lab Results  Component Value Date   TIBC 464 (H) 01/12/2021   TIBC 483 (H) 10/22/2020   TIBC 476 08/26/2020   FERRITIN 21 01/12/2021   FERRITIN 10 (L) 11/26/2020   FERRITIN 38 10/22/2020   IRONPCTSAT 21 01/12/2021   IRONPCTSAT 17 10/22/2020   IRONPCTSAT 5.2 08/26/2020   No results found for: LDH  STUDIES:  No results found.    HISTORY:   Past Medical History:  Diagnosis Date   Anemia 07/02/2018   Aortic stenosis, mild 09/03/2018   Cirrhosis of liver without ascites ([place]) 07/03/2019   Diabetes mellitus due to underlying condition with unspecified complications ([place]) 06/27/5808   Diabetes mellitus without complication ([place])    Essential hypertension 07/02/2018   Hematochezia 06/20/2016   History of colonic polyps 02/18/2019   Hyperlipidemia    Hypertension    Iron deficiency anemia 06/20/2016   Iron deficiency anemia, unspecified    RUQ pain 06/20/2016   Tightness in chest 09/03/2018    Past Surgical History:  Procedure Laterality Date   ABDOMINAL HYSTERECTOMY     BACK SURGERY      Family History  Problem Relation Age of Onset   Heart disease Mother    Alzheimer's disease Mother    Cancer Father     Lymphoma Father    Parkinson's disease Paternal Grandmother    Kidney cancer Paternal Uncle     Social History:  reports that she has quit smoking. She has never used smokeless tobacco. She reports that she does not drink alcohol and does not use drugs.The patient is alone today.  Allergies: No Known Allergies  Current Medications: Current Outpatient Medications  Medication Sig Dispense Refill   albuterol (VENTOLIN HFA) 108 (90 Base) MCG/ACT inhaler Inhale into the lungs.     colesevelam (WELCHOL) 625 MG tablet Take 1,875 mg by mouth 2 (two) times daily.     furosemide (LASIX) 40 MG tablet Take 1 tablet by mouth daily.     insulin aspart (NOVOLOG) 100 UNIT/ML FlexPen Inject 30 Units into the skin 2 (two) times daily.     lactulose (CHRONULAC) 10 GM/15ML solution Take 20 g by mouth 1 day or 1 dose.  5   LEVEMIR FLEXTOUCH 100 UNIT/ML Pen Inject 40 Units into the skin 2 (two) times daily.  12   levothyroxine (SYNTHROID) 50 MCG tablet Take 50 mcg by mouth daily.     liraglutide (VICTOZA) 18 MG/3ML SOPN Inject 1.8 mg into the skin daily.      lisinopril (PRINIVIL,ZESTRIL) 40 MG tablet Take 1 tablet by mouth daily.  3   metFORMIN (GLUCOPHAGE-XR) 500 MG 24 hr tablet Take 500 mg by mouth 2 (two) times daily with a meal.  5   montelukast (SINGULAIR) 10 MG tablet Take 10 mg by mouth at bedtime.     nystatin (MYCOSTATIN/NYSTOP) powder      omeprazole (PRILOSEC) 20 MG capsule Take 1 capsule by mouth daily.  3   ondansetron (ZOFRAN) 4 MG tablet Take 1 tablet (4 mg total) by mouth every 4 (four) hours as needed for nausea. 90 tablet 3   oxyCODONE-acetaminophen (PERCOCET/ROXICET) 5-325 MG tablet Take 1 tablet by mouth every 4 (four) hours.     pravastatin (PRAVACHOL) 20 MG tablet Take 1 tablet by mouth once a week.     pregabalin (LYRICA) 75 MG capsule Take 75 mg by mouth at bedtime.  1   promethazine-codeine (PHENERGAN WITH CODEINE) 6.25-10 MG/5ML syrup Take 5 mLs by mouth every 6 (six) hours as  needed.     rOPINIRole (REQUIP) 2 MG tablet Take 1 tablet by mouth at bedtime.  3   spironolactone (ALDACTONE) 50 MG tablet Take 1 tablet (50 mg total) by mouth daily. 90 tablet 1   valACYclovir (VALTREX) 1000 MG tablet Take 1,000 mg by mouth 2 (two) times daily.     Vitamin D, Ergocalciferol, (DRISDOL) 1.25 MG (50000 UT) CAPS capsule Take 1 capsule by mouth once a week.  No current facility-administered medications for this visit.     ASSESSMENT & PLAN:   Assessment:  1. Iron deficiency anemia, felt to be secondary to chronic GI blood loss from AVM's, as well as intermittent epistaxis. She saw Dr. Malissa Hippo at [facility] and he plans further evaluation.  She has required regular IV iron replacement and transfusions.  She had recurrent iron deficiency in May, so received IV Feraheme again with improvement in her hemoglobin. Her hemoglobin is slightly improved at 10 and her ferritin has improved. We will continue observation.   2. Non-alcoholic liver cirrhosis, with intermittent mild thrombocytopenia.  Her anemia is also likely partly related to the cirrhosis.   3. Ascites, for which she is on spironolactone and furosemide. Her ascites has improved with increasing spironolactone to 50 mg daily.   4. Hepatic encephalopathy, her ammonia is back in normal range with increasing the lactulose to twice daily. I advised her to continue this to avoid her ammonia level going up again and causing her symptoms.  5. Multiple AVM's of the small bowel seen on capsule endoscopy in [place], treated at [facility] in February 2021.   She saw Dr. Malissa Hippo and he plans additional evaluation.   Plan:    Her hemoglobin is slightly improved today. She is to undergo further GI evaluation at [facility].  She knows to continue spironolactone 50 mg daily and furosemide 40 mg twice daily, as well as lactulose twice daily.  I will plan to see her back in 4 weeks with a CBC, comprehensive metabolic panel, ammonia level,  iron panel and ferritin for repeat clinical assessment.  The patient understands the plans discussed today and is in agreement with them. She knows to contact us if she develops symptoms of worsening anemia prior to that visit.     Marvia Pickles, PA-C

## 2021-01-12 NOTE — Telephone Encounter (Signed)
Per 6/21 los next appt scheduled and given to patient 

## 2021-01-13 ENCOUNTER — Telehealth: Payer: Self-pay

## 2021-01-13 ENCOUNTER — Encounter: Payer: Self-pay | Admitting: Hematology and Oncology

## 2021-01-13 NOTE — Telephone Encounter (Signed)
Pt called to request the ammonia level result, as she has an appt in the morning out of town. She wants to be able to give them the result. Ammonia level is 14.

## 2021-01-14 ENCOUNTER — Ambulatory Visit: Payer: Medicare Other | Admitting: Hematology and Oncology

## 2021-01-14 ENCOUNTER — Encounter: Payer: Self-pay | Admitting: Hematology and Oncology

## 2021-01-14 ENCOUNTER — Other Ambulatory Visit: Payer: Medicare Other

## 2021-01-14 DIAGNOSIS — E119 Type 2 diabetes mellitus without complications: Secondary | ICD-10-CM | POA: Diagnosis not present

## 2021-01-14 DIAGNOSIS — Z7951 Long term (current) use of inhaled steroids: Secondary | ICD-10-CM | POA: Diagnosis not present

## 2021-01-14 DIAGNOSIS — D500 Iron deficiency anemia secondary to blood loss (chronic): Secondary | ICD-10-CM | POA: Diagnosis not present

## 2021-01-14 DIAGNOSIS — K31819 Angiodysplasia of stomach and duodenum without bleeding: Secondary | ICD-10-CM | POA: Diagnosis not present

## 2021-01-14 DIAGNOSIS — K921 Melena: Secondary | ICD-10-CM | POA: Diagnosis not present

## 2021-01-14 DIAGNOSIS — K3189 Other diseases of stomach and duodenum: Secondary | ICD-10-CM | POA: Diagnosis not present

## 2021-01-14 DIAGNOSIS — K31811 Angiodysplasia of stomach and duodenum with bleeding: Secondary | ICD-10-CM | POA: Diagnosis not present

## 2021-01-14 DIAGNOSIS — K5520 Angiodysplasia of colon without hemorrhage: Secondary | ICD-10-CM | POA: Diagnosis not present

## 2021-01-14 DIAGNOSIS — K766 Portal hypertension: Secondary | ICD-10-CM | POA: Diagnosis not present

## 2021-01-14 DIAGNOSIS — J449 Chronic obstructive pulmonary disease, unspecified: Secondary | ICD-10-CM | POA: Diagnosis not present

## 2021-01-14 DIAGNOSIS — K5710 Diverticulosis of small intestine without perforation or abscess without bleeding: Secondary | ICD-10-CM | POA: Diagnosis not present

## 2021-01-14 DIAGNOSIS — Z79899 Other long term (current) drug therapy: Secondary | ICD-10-CM | POA: Diagnosis not present

## 2021-01-14 DIAGNOSIS — K7460 Unspecified cirrhosis of liver: Secondary | ICD-10-CM | POA: Diagnosis not present

## 2021-01-14 DIAGNOSIS — Z7952 Long term (current) use of systemic steroids: Secondary | ICD-10-CM | POA: Diagnosis not present

## 2021-01-14 DIAGNOSIS — Z7984 Long term (current) use of oral hypoglycemic drugs: Secondary | ICD-10-CM | POA: Diagnosis not present

## 2021-01-14 DIAGNOSIS — Z794 Long term (current) use of insulin: Secondary | ICD-10-CM | POA: Diagnosis not present

## 2021-01-19 ENCOUNTER — Telehealth: Payer: Self-pay | Admitting: Oncology

## 2021-01-19 NOTE — Telephone Encounter (Signed)
Patient rescheduled 7/19 Labs, Follow Up w/Kelli to 7/26 Labs, Follow Up w/Dr Hinton Rao

## 2021-02-09 ENCOUNTER — Ambulatory Visit: Payer: Medicare Other | Admitting: Hematology and Oncology

## 2021-02-09 ENCOUNTER — Other Ambulatory Visit: Payer: Medicare Other

## 2021-02-09 DIAGNOSIS — E785 Hyperlipidemia, unspecified: Secondary | ICD-10-CM | POA: Diagnosis not present

## 2021-02-09 DIAGNOSIS — Z0000 Encounter for general adult medical examination without abnormal findings: Secondary | ICD-10-CM | POA: Diagnosis not present

## 2021-02-09 DIAGNOSIS — Z9181 History of falling: Secondary | ICD-10-CM | POA: Diagnosis not present

## 2021-02-09 DIAGNOSIS — Z1331 Encounter for screening for depression: Secondary | ICD-10-CM | POA: Diagnosis not present

## 2021-02-09 NOTE — Progress Notes (Signed)
[place]  [address] [place],  [place]  [address] [phone]  Clinic Day:  02/16/2021  Referring physician: Nicoletta Dress, MD  This document serves as a record of services personally performed by Hosie Poisson, MD. It was created on their behalf by [facility], a trained medical scribe. The creation of this record is based on the scribe's personal observations and the provider's statements to them.  CHIEF COMPLAINT:  CC:  Iron deficiency anemia  Current Treatment:   IV iron as needed  HISTORY OF PRESENT ILLNESS:  [name] is a 70 y.o. female  who we began seeing in December 2019 for iron deficiency anemia. In November 2018, colonoscopy revealed tubular adenomas.  Upper endoscopy in January 2018 did not reveal any specific findings.  She was clearly iron deficient.  She has multiple comorbidities, including diabetes, liver cirrhosis, COPD, hyperlipidemia, and degenerative disc disease.  Testing for any other deficiency or monoclonal spike was negative. She had already been on oral supplement for nearly 1 year with lack of response, so was given IV iron in the form of Feraheme.  She felt somewhat better after the IV iron, but still complained of fatigue and weakness.  She received IV Feraheme again in June.  Her hemoglobin improved, but was still low at 11.0.   Repeat colonoscopy and EGD were done in August 2020 by Dr. Noberto Retort.  Biopsy was negative.  In September 2020,her hemoglobin had dropped from 9.8 to 9.0 at Dr. Denton Lank office.  When we saw her later in September, the hemoglobin had dropped down to 7.5.  She was transfused with 2 units of packed red blood cells.  Repeat iron studies once again revealed iron deficiency, so was given IV Feraheme.  CT abdomen and pelvis was negative.  She felt so much better after the transfusion that she jumped out of bed and fractured her right foot in a fall.  She had a drop in her hemoglobin  again in November and was given IV Feraheme again.  Capsule endoscopy in November 2020 in [facility] revealed multiple AVM's of the small bowel.     We continued to follow her periodically. She had cauterization of an AVM of the small bowel at [facility] in February 2021. In March 2021, her hemoglobin dropped back down 7.6.  She therefore received IV Feraheme again in early April.  Abdominal ultrasound in May revealed cirrhosis of the liver with nodular surface, increased echogenicity and heterogeneity.  No focal liver parenchymal lesion was observed.  She has had intermittent mild thrombocytopenia, presumably from her liver cirrhosis. She received IV Feraheme in early June, but only one dose due to her leaving town.  She also received 2 units of packed red blood cells at that time. She presented to the [facility] emergency department later in June due to generalized weakness that had been ongoing for 1 week, as well as altered mental status, and was admitted.  She had just returned from a trip to [place].  While out of town, she developed severe epistaxis that required packing and 1 unit of packed red blood cells.  CT imaging revealed small amount of blood in the right maxillary and sphenoid sinuses with tube placed in the right paranasal sinus.  She was found to have a UTI.     She developed a severe epistaxis requiring hospital admission in late July 2021 and had a nasal bullet in place.  Her hemoglobin dropped down to  6.9, so she has been transfused, and given a dose of IV Feraheme while in the hospital.  She had an episode of hypotension and fever and was felt to be septic, so was transferred to the ICU.  She was placed on broad-spectrum IV antibiotics, and cultures remained negative.  Echocardiogram in July showed a suspicious mass in the right atrium, which may be attached to the interatrial septum.  The cardiologist reviewed the echocardiogram and felt that this was likely an artifact and not a mass,  vegetation or thrombus.  The patient continued to complain of pain from the compression device in her nose and described some green drainage from her sinuses, potentially the source of her fever and sepsis.  Her hemoglobin was 8.7 at the time of discharge in July.  She was seen in August and reported weakness of her hands and shakiness of her arms and legs.  She was occasionally dropping objects.  She also reported fatigue, dyspnea with exertion and moderate abdominal swelling.  She had a recent follow-up at Dr. Julien Nordmann office with a good cardiac report.  Due to the asterixis, an ammonia level was obtained, and was elevated at 62.  She was therefore placed on lactulose 30 mL twice a day. She was seen a week later with some improvement, but her serum ammonia was still 54, so the lactulose was increased to TID.  She also had hyperkalemia, so her spironolactone 25 mg was held. We then resumed one pill daily along with Lasix 40 mg daily due to recurrent ascites.  She was transfused with 2 units of PRBCs again at the end of September when her hemoglobin dropped down to 7.3.  Iron level is 13.0 with a TIBC of 415 for a saturation of 3.1%. She was scheduled for 2 more doses of IV Feraheme.  She was admitted in October  with a fever of 105 and had E coli sepsis. Lactulose was placed on hold due to severe diarrhea, adding to her dehydration. Her serum ammonia came down to 9, so we did not resume lactulose.  She did have a new splenic infarction as of this admission, which was causing a lot of left abdominal pain.  Her spironolactone was increased to 50 mg to take 1 to 2 daily.  At her visit in November, she says she was placed back on lactulose daily.  When she was seen in early February 2022, her CBC reveals a hemoglobin of 7.6 with normal ANC and platelets.  She was therefore transfused 2 units of PRBCs. Iron studies revealed recurrent iron deficiency.. Ammonia level was elevated at 35,  so her lactulose was increased  to 30 cc twice daily.  She was scheduled for IV iron.  She continued to have blood loss with black stools and one episode of moderate epistaxis,  She has been very frustrated by the constant recurrent bleeding, which we think is from the AVM's of the small bowel. She would like to pursue any possible procedures to address that and is scheduled to see the gastroenterologist at [facility] on May 27th.  She has continued to fluctuate up and down and has now seen Dr. Birdie Hopes, gastroenterology at [facility].  She had an EGD with plasma coagulation to cauterize two AVMs.  He has now recommended observation and will see her back if her hemoglobin drops again, and we document iron deficiency.    INTERVAL HISTORY:  [name] is here for repeat clinical assessment.  In late June she was seen at  [facility] and underwent argon plasma coagulation for two small, faint AVMs. If her anemia progresses despite treatment, they will pursue DBE.  She states that she has been feeling well following her procedure as her fatigue has improved.  She continues furosemide, lactulose and spironolactone as recommended.  She states that she did "slack off" on the lactulose while [place] was [place] the beach, but has returned to normal dosing.  Her hemoglobin has mildly decreased from 10.0 to 9.6, her platelet count is stable at 118,000, and her white count is normal.  Chemistries are unremarkable.  Serum ammonia is mildly elevated at 33.0.  I advised that she not skip anymore doses of lactulose.  Her  appetite is good, and she has gained 1 pound since her last visit.  She denies fever, chills or other signs of infection.  She denies nausea, vomiting, bowel issues, or abdominal pain.  She denies sore throat, cough, dyspnea, or chest pain.  REVIEW OF SYSTEMS:  Review of Systems  Constitutional:  Positive for fatigue (improved). Negative for appetite change, chills, fever and unexpected weight change.  HENT:   Negative for lump/mass, mouth sores and sore throat.    Respiratory:  Negative for cough and shortness of breath.   Cardiovascular:  Negative for chest pain and leg swelling.  Gastrointestinal:  Negative for abdominal pain, constipation, diarrhea, nausea and vomiting.  Endocrine: Negative for hot flashes.  Genitourinary:  Negative for difficulty urinating, dysuria, frequency and hematuria.   Musculoskeletal:  Negative for arthralgias, back pain and myalgias.  Skin:  Negative for rash.  Neurological:  Negative for dizziness and headaches.  Hematological:  Negative for adenopathy. Does not bruise/bleed easily.  Psychiatric/Behavioral:  Negative for depression and sleep disturbance. The patient is not nervous/anxious.    VITALS:  Blood pressure (!) 175/72, pulse 82, temperature 98 F (36.7 C), temperature source Oral, resp. rate 18, height 5\' 3"  (1.6 m), weight 170 lb 4.8 oz (77.2 kg), SpO2 98 %.  Wt Readings from Last 3 Encounters:  02/16/21 170 lb 4.8 oz (77.2 kg)  01/12/21 169 lb 1.6 oz (76.7 kg)  12/17/20 169 lb 1.6 oz (76.7 kg)    Body mass index is 30.17 kg/m.  Performance status (ECOG): 1 - Symptomatic but completely ambulatory  PHYSICAL EXAM:  Physical Exam Constitutional:      General: She is not in acute distress.    Appearance: Normal appearance. She is normal weight.  HENT:     Head: Normocephalic and atraumatic.  Eyes:     General: No scleral icterus.    Extraocular Movements: Extraocular movements intact.     Conjunctiva/sclera: Conjunctivae normal.     Pupils: Pupils are equal, round, and reactive to light.  Cardiovascular:     Rate and Rhythm: Normal rate and regular rhythm.     Pulses: Normal pulses.     Heart sounds: Normal heart sounds. No murmur heard.   No friction rub. No gallop.  Pulmonary:     Effort: Pulmonary effort is normal. No respiratory distress.     Breath sounds: Wheezing (upper airways) present.  Abdominal:     General: Bowel sounds are normal. There is no distension.     Palpations: Abdomen  is soft. There is hepatomegaly (mild with a firm edge). There is no splenomegaly or mass.     Tenderness: There is no abdominal tenderness.  Musculoskeletal:        General: Normal range of motion.     Cervical back: Normal range of motion  and neck supple.     Right lower leg: No edema.     Left lower leg: No edema.  Lymphadenopathy:     Cervical: No cervical adenopathy.  Skin:    General: Skin is warm and dry.  Neurological:     General: No focal deficit present.     Mental Status: She is alert and oriented to person, place, and time. Mental status is at baseline.  Psychiatric:        Mood and Affect: Mood normal.        Behavior: Behavior normal.        Thought Content: Thought content normal.        Judgment: Judgment normal.   LABS:   CBC Latest Ref Rng & Units 02/16/2021 01/12/2021 12/17/2020  WBC - 4.3 5.2 5.0  Hemoglobin 12.0 - 16.0 9.6(A) 10.0(A) 9.7(A)  Hematocrit 36 - 46 29(A) 30(A) 30(A)  Platelets 150 - 399 118(A) 114(A) 99(A)   CMP Latest Ref Rng & Units 01/12/2021 12/17/2020 12/03/2020  Glucose 70 - 99 mg/dL - - -  BUN 4 - 21 13 9 20   Creatinine 0.5 - 1.1 0.7 0.8 0.9  Sodium 137 - 147 137 139 139  Potassium 3.4 - 5.3 4.0 4.2 3.9  Chloride 99 - 108 105 110(A) 109(A)  CO2 13 - 22 27(A) 27(A) 21  Calcium 8.7 - 10.7 9.6 9.1 9.1  Total Protein 6.0 - 8.3 g/dL - - -  Total Bilirubin 0.3 - 1.2 mg/dL - - -  Alkaline Phos 25 - 125 58 76 72  AST 13 - 35 37(A) 34 43(A)  ALT 7 - 35 24 27 30     Lab Results  Component Value Date   TIBC 464 (H) 01/12/2021   TIBC 483 (H) 10/22/2020   TIBC 476 08/26/2020   FERRITIN 21 01/12/2021   FERRITIN 10 (L) 11/26/2020   FERRITIN 38 10/22/2020   IRONPCTSAT 21 01/12/2021   IRONPCTSAT 17 10/22/2020   IRONPCTSAT 5.2 08/26/2020    STUDIES:  No results found.    HISTORY:   Allergies: No Known Allergies  Current Medications: Current Outpatient Medications  Medication Sig Dispense Refill   albuterol (VENTOLIN HFA) 108 (90 Base)  MCG/ACT inhaler Inhale into the lungs.     colesevelam (WELCHOL) 625 MG tablet Take 1,875 mg by mouth 2 (two) times daily.     furosemide (LASIX) 40 MG tablet Take 1 tablet by mouth daily.     insulin aspart (NOVOLOG) 100 UNIT/ML FlexPen Inject 30 Units into the skin 2 (two) times daily.     lactulose (CHRONULAC) 10 GM/15ML solution Take 20 g by mouth 1 day or 1 dose.  5   LEVEMIR FLEXTOUCH 100 UNIT/ML Pen Inject 40 Units into the skin 2 (two) times daily.  12   levothyroxine (SYNTHROID) 50 MCG tablet Take 50 mcg by mouth daily.     liraglutide (VICTOZA) 18 MG/3ML SOPN Inject 1.8 mg into the skin daily.      lisinopril (PRINIVIL,ZESTRIL) 40 MG tablet Take 1 tablet by mouth daily.  3   metFORMIN (GLUCOPHAGE-XR) 500 MG 24 hr tablet Take 500 mg by mouth 2 (two) times daily with a meal.  5   montelukast (SINGULAIR) 10 MG tablet Take 10 mg by mouth at bedtime.     nystatin (MYCOSTATIN/NYSTOP) powder      omeprazole (PRILOSEC) 20 MG capsule Take 1 capsule by mouth daily.  3   ondansetron (ZOFRAN) 4 MG tablet Take 1 tablet (4 mg  total) by mouth every 4 (four) hours as needed for nausea. 90 tablet 3   oxyCODONE-acetaminophen (PERCOCET/ROXICET) 5-325 MG tablet Take 1 tablet by mouth every 4 (four) hours.     pravastatin (PRAVACHOL) 20 MG tablet Take 1 tablet by mouth once a week.     pregabalin (LYRICA) 75 MG capsule Take 75 mg by mouth at bedtime.  1   promethazine-codeine (PHENERGAN WITH CODEINE) 6.25-10 MG/5ML syrup Take 5 mLs by mouth every 6 (six) hours as needed.     rOPINIRole (REQUIP) 2 MG tablet Take 1 tablet by mouth at bedtime.  3   spironolactone (ALDACTONE) 50 MG tablet Take 1 tablet (50 mg total) by mouth daily. 90 tablet 1   valACYclovir (VALTREX) 1000 MG tablet Take 1,000 mg by mouth 2 (two) times daily.     Vitamin D, Ergocalciferol, (DRISDOL) 1.25 MG (50000 UT) CAPS capsule Take 1 capsule by mouth once a week.     No current facility-administered medications for this visit.      ASSESSMENT & PLAN:   Assessment:  1. Iron deficiency anemia, felt to be secondary to chronic GI blood loss from AVM's, as well as intermittent epistaxis. She saw Dr. Malissa Hippo at [facility] and he plans further evaluation.  She has required regular IV iron replacement and transfusions.  She had recurrent iron deficiency in May, so received IV Feraheme again with improvement in her hemoglobin. We will continue observation.   2. Non-alcoholic liver cirrhosis, with intermittent mild thrombocytopenia.  Her anemia is also likely partly related to the cirrhosis.   3. Ascites, for which she is on spironolactone and furosemide. Her ascites has improved with increasing spironolactone to 50 mg daily.   4. Hepatic encephalopathy.  She had a slight increase in her serum ammonia due to partial noncompliance.  I advised her to continue her recommended dosing to avoid her ammonia level going up again and causing her symptoms.  5. Multiple AVM's of the small bowel seen on capsule endoscopy in [place], treated at [facility] in February 2021.   She saw Dr. Gershon Cull in June and two small AVMs were treated with argon plasma coagulation.    Plan:     Her hemoglobin is holding and we will send copies of her labs to Dr. Gershon Cull at [facility].  She knows to continue spironolactone 50 mg daily and furosemide 40 mg twice daily, as well as lactulose twice daily.  I will plan to see her back in 5 weeks with a CBC, comprehensive metabolic panel and ammonia level for repeat clinical assessment.  We will The patient understands the plans discussed today and is in agreement with them. She knows to contact us if she develops symptoms of worsening anemia prior to that visit.  ADDENDUM:  Her iron levels are still extremely low so I will schedule her for additional doses of IV Feraheme.   I, Rita Ohara, am acting as scribe for Derwood Kaplan, MD  I have reviewed this report as typed by the medical scribe, and it is  complete and accurate.

## 2021-02-16 ENCOUNTER — Other Ambulatory Visit: Payer: Self-pay | Admitting: Oncology

## 2021-02-16 ENCOUNTER — Other Ambulatory Visit: Payer: Self-pay

## 2021-02-16 ENCOUNTER — Encounter: Payer: Self-pay | Admitting: Oncology

## 2021-02-16 ENCOUNTER — Inpatient Hospital Stay: Payer: Medicare Other | Attending: Oncology

## 2021-02-16 ENCOUNTER — Inpatient Hospital Stay (INDEPENDENT_AMBULATORY_CARE_PROVIDER_SITE_OTHER): Payer: Medicare Other | Admitting: Oncology

## 2021-02-16 VITALS — BP 175/72 | HR 82 | Temp 98.0°F | Resp 18 | Ht 63.0 in | Wt 170.3 lb

## 2021-02-16 DIAGNOSIS — D500 Iron deficiency anemia secondary to blood loss (chronic): Secondary | ICD-10-CM

## 2021-02-16 DIAGNOSIS — K7581 Nonalcoholic steatohepatitis (NASH): Secondary | ICD-10-CM

## 2021-02-16 DIAGNOSIS — R188 Other ascites: Secondary | ICD-10-CM | POA: Diagnosis not present

## 2021-02-16 DIAGNOSIS — K7469 Other cirrhosis of liver: Secondary | ICD-10-CM

## 2021-02-16 DIAGNOSIS — K7460 Unspecified cirrhosis of liver: Secondary | ICD-10-CM

## 2021-02-16 DIAGNOSIS — Z79899 Other long term (current) drug therapy: Secondary | ICD-10-CM | POA: Insufficient documentation

## 2021-02-16 DIAGNOSIS — D509 Iron deficiency anemia, unspecified: Secondary | ICD-10-CM | POA: Diagnosis not present

## 2021-02-16 DIAGNOSIS — Q2730 Arteriovenous malformation, site unspecified: Secondary | ICD-10-CM | POA: Diagnosis not present

## 2021-02-16 DIAGNOSIS — K7290 Hepatic failure, unspecified without coma: Secondary | ICD-10-CM | POA: Diagnosis not present

## 2021-02-16 DIAGNOSIS — D649 Anemia, unspecified: Secondary | ICD-10-CM | POA: Diagnosis not present

## 2021-02-16 LAB — CBC AND DIFFERENTIAL
HCT: 29 — AB (ref 36–46)
Hemoglobin: 9.6 — AB (ref 12.0–16.0)
Neutrophils Absolute: 2.8
Platelets: 118 — AB (ref 150–399)
WBC: 4.3

## 2021-02-16 LAB — BASIC METABOLIC PANEL
BUN: 11 (ref 4–21)
CO2: 25 — AB (ref 13–22)
Chloride: 107 (ref 99–108)
Creatinine: 0.7 (ref 0.5–1.1)
Glucose: 145
Potassium: 3.5 (ref 3.4–5.3)
Sodium: 141 (ref 137–147)

## 2021-02-16 LAB — COMPREHENSIVE METABOLIC PANEL
Albumin: 3.8 (ref 3.5–5.0)
Calcium: 9.3 (ref 8.7–10.7)

## 2021-02-16 LAB — HEPATIC FUNCTION PANEL
ALT: 23 (ref 7–35)
AST: 38 — AB (ref 13–35)
Alkaline Phosphatase: 63 (ref 25–125)
Bilirubin, Total: 0.6

## 2021-02-16 LAB — IRON AND TIBC
Iron: 43 ug/dL (ref 28–170)
Saturation Ratios: 9 % — ABNORMAL LOW (ref 10.4–31.8)
TIBC: 494 ug/dL — ABNORMAL HIGH (ref 250–450)
UIBC: 451 ug/dL

## 2021-02-16 LAB — CBC: RBC: 2.96 — AB (ref 3.87–5.11)

## 2021-02-16 LAB — FERRITIN: Ferritin: 6 ng/mL — ABNORMAL LOW (ref 11–307)

## 2021-02-18 ENCOUNTER — Telehealth: Payer: Self-pay

## 2021-02-18 ENCOUNTER — Encounter: Payer: Self-pay | Admitting: Oncology

## 2021-02-18 NOTE — Telephone Encounter (Signed)
Patient notified

## 2021-02-18 NOTE — Addendum Note (Signed)
Addended by: Juanetta Beets on: 02/18/2021 04:19 PM   Modules accepted: Orders

## 2021-02-18 NOTE — Telephone Encounter (Signed)
-----   Message from Juanetta Beets, [facility] sent at 02/18/2021  1:51 PM EDT ----- Regarding: RE: IV iron Orders added and message has been sent to scheduling inbasket.  ----- Message ----- From: Derwood Kaplan, MD Sent: 02/18/2021  10:14 AM EDT To: Juanetta Beets, RPH, Belva Chimes, LPN, # Subject: IV iron                                        Tell her iron levels still quite low, I think she would benefit from 2 more doses IV Feraheme. Pls schedule if she agrees

## 2021-02-18 NOTE — Addendum Note (Signed)
Addended by: Juanetta Beets on: 02/18/2021 02:42 PM   Modules accepted: Orders

## 2021-02-21 ENCOUNTER — Encounter: Payer: Self-pay | Admitting: Oncology

## 2021-02-22 ENCOUNTER — Inpatient Hospital Stay: Payer: Medicare Other | Attending: Oncology

## 2021-02-22 ENCOUNTER — Other Ambulatory Visit: Payer: Self-pay

## 2021-02-22 ENCOUNTER — Encounter: Payer: Self-pay | Admitting: Oncology

## 2021-02-22 VITALS — BP 123/52 | HR 80 | Temp 98.1°F | Resp 18 | Ht 63.0 in | Wt 170.0 lb

## 2021-02-22 DIAGNOSIS — D696 Thrombocytopenia, unspecified: Secondary | ICD-10-CM | POA: Diagnosis not present

## 2021-02-22 DIAGNOSIS — E785 Hyperlipidemia, unspecified: Secondary | ICD-10-CM | POA: Insufficient documentation

## 2021-02-22 DIAGNOSIS — D509 Iron deficiency anemia, unspecified: Secondary | ICD-10-CM | POA: Diagnosis not present

## 2021-02-22 DIAGNOSIS — E119 Type 2 diabetes mellitus without complications: Secondary | ICD-10-CM | POA: Diagnosis not present

## 2021-02-22 DIAGNOSIS — E875 Hyperkalemia: Secondary | ICD-10-CM | POA: Diagnosis not present

## 2021-02-22 DIAGNOSIS — D500 Iron deficiency anemia secondary to blood loss (chronic): Secondary | ICD-10-CM

## 2021-02-22 DIAGNOSIS — Z79899 Other long term (current) drug therapy: Secondary | ICD-10-CM | POA: Diagnosis not present

## 2021-02-22 DIAGNOSIS — Z794 Long term (current) use of insulin: Secondary | ICD-10-CM | POA: Diagnosis not present

## 2021-02-22 MED ORDER — SODIUM CHLORIDE 0.9 % IV SOLN
510.0000 mg | Freq: Once | INTRAVENOUS | Status: AC
  Administered 2021-02-22: 510 mg via INTRAVENOUS
  Filled 2021-02-22: qty 510

## 2021-02-22 MED ORDER — SODIUM CHLORIDE 0.9 % IV SOLN
Freq: Once | INTRAVENOUS | Status: AC
  Filled 2021-02-22: qty 250

## 2021-02-22 NOTE — Patient Instructions (Signed)
Ferumoxytol injection What is this medication? FERUMOXYTOL is an iron complex. Iron is used to make healthy red blood cells, which carry oxygen and nutrients throughout the body. This medicine is used totreat iron deficiency anemia. This medicine may be used for other purposes; ask your health care provider orpharmacist if you have questions. COMMON BRAND NAME(S): Feraheme What should I tell my care team before I take this medication? They need to know if you have any of these conditions: anemia not caused by low iron levels high levels of iron in the blood magnetic resonance imaging (MRI) test scheduled an unusual or allergic reaction to iron, other medicines, foods, dyes, or preservatives pregnant or trying to get pregnant breast-feeding How should I use this medication? This medicine is for injection into a vein. It is given by a health careprofessional in a hospital or clinic setting. Talk to your pediatrician regarding the use of this medicine in children.Special care may be needed. Overdosage: If you think you have taken too much of this medicine contact apoison control center or emergency room at once. NOTE: This medicine is only for you. Do not share this medicine with others. What if I miss a dose? It is important not to miss your dose. Call your doctor or health careprofessional if you are unable to keep an appointment. What may interact with this medication? This medicine may interact with the following medications: other iron products This list may not describe all possible interactions. Give your health care provider a list of all the medicines, herbs, non-prescription drugs, or dietary supplements you use. Also tell them if you smoke, drink alcohol, or use illegaldrugs. Some items may interact with your medicine. What should I watch for while using this medication? Visit your doctor or healthcare professional regularly. Tell your doctor or healthcare professional if your  symptoms do not start to get better or if theyget worse. You may need blood work done while you are taking this medicine. You may need to follow a special diet. Talk to your doctor. Foods that contain iron include: whole grains/cereals, dried fruits, beans, or peas, leafy greenvegetables, and organ meats (liver, kidney). What side effects may I notice from receiving this medication? Side effects that you should report to your doctor or health care professionalas soon as possible: allergic reactions like skin rash, itching or hives, swelling of the face, lips, or tongue breathing problems changes in blood pressure feeling faint or lightheaded, falls fever or chills flushing, sweating, or hot feelings swelling of the ankles or feet Side effects that usually do not require medical attention (report to yourdoctor or health care professional if they continue or are bothersome): diarrhea headache nausea, vomiting stomach pain This list may not describe all possible side effects. Call your doctor for medical advice about side effects. You may report side effects to FDA at1-800-FDA-1088. Where should I keep my medication? This drug is given in a hospital or clinic and will not be stored at home. NOTE: This sheet is a summary. It may not cover all possible information. If you have questions about this medicine, talk to your doctor, pharmacist, orhealth care provider.  2022 Elsevier/Gold Standard (2016-08-29 20:21:10)  

## 2021-03-01 ENCOUNTER — Other Ambulatory Visit: Payer: Self-pay

## 2021-03-01 ENCOUNTER — Inpatient Hospital Stay: Payer: Medicare Other

## 2021-03-01 VITALS — BP 141/51 | HR 79 | Temp 98.1°F | Resp 18 | Ht 63.0 in | Wt 166.0 lb

## 2021-03-01 DIAGNOSIS — Z794 Long term (current) use of insulin: Secondary | ICD-10-CM | POA: Diagnosis not present

## 2021-03-01 DIAGNOSIS — D509 Iron deficiency anemia, unspecified: Secondary | ICD-10-CM | POA: Diagnosis not present

## 2021-03-01 DIAGNOSIS — D696 Thrombocytopenia, unspecified: Secondary | ICD-10-CM | POA: Diagnosis not present

## 2021-03-01 DIAGNOSIS — E119 Type 2 diabetes mellitus without complications: Secondary | ICD-10-CM | POA: Diagnosis not present

## 2021-03-01 DIAGNOSIS — E875 Hyperkalemia: Secondary | ICD-10-CM | POA: Diagnosis not present

## 2021-03-01 DIAGNOSIS — E785 Hyperlipidemia, unspecified: Secondary | ICD-10-CM | POA: Diagnosis not present

## 2021-03-01 DIAGNOSIS — D500 Iron deficiency anemia secondary to blood loss (chronic): Secondary | ICD-10-CM

## 2021-03-01 MED ORDER — SODIUM CHLORIDE 0.9 % IV SOLN
510.0000 mg | Freq: Once | INTRAVENOUS | Status: AC
  Administered 2021-03-01: 510 mg via INTRAVENOUS
  Filled 2021-03-01: qty 17

## 2021-03-01 MED ORDER — SODIUM CHLORIDE 0.9 % IV SOLN
Freq: Once | INTRAVENOUS | Status: AC
  Filled 2021-03-01: qty 250

## 2021-03-01 NOTE — Patient Instructions (Signed)
Ferumoxytol injection What is this medication? FERUMOXYTOL is an iron complex. Iron is used to make healthy red blood cells, which carry oxygen and nutrients throughout the body. This medicine is used totreat iron deficiency anemia. This medicine may be used for other purposes; ask your health care provider orpharmacist if you have questions. COMMON BRAND NAME(S): Feraheme What should I tell my care team before I take this medication? They need to know if you have any of these conditions: anemia not caused by low iron levels high levels of iron in the blood magnetic resonance imaging (MRI) test scheduled an unusual or allergic reaction to iron, other medicines, foods, dyes, or preservatives pregnant or trying to get pregnant breast-feeding How should I use this medication? This medicine is for injection into a vein. It is given by a health careprofessional in a hospital or clinic setting. Talk to your pediatrician regarding the use of this medicine in children.Special care may be needed. Overdosage: If you think you have taken too much of this medicine contact apoison control center or emergency room at once. NOTE: This medicine is only for you. Do not share this medicine with others. What if I miss a dose? It is important not to miss your dose. Call your doctor or health careprofessional if you are unable to keep an appointment. What may interact with this medication? This medicine may interact with the following medications: other iron products This list may not describe all possible interactions. Give your health care provider a list of all the medicines, herbs, non-prescription drugs, or dietary supplements you use. Also tell them if you smoke, drink alcohol, or use illegaldrugs. Some items may interact with your medicine. What should I watch for while using this medication? Visit your doctor or healthcare professional regularly. Tell your doctor or healthcare professional if your  symptoms do not start to get better or if theyget worse. You may need blood work done while you are taking this medicine. You may need to follow a special diet. Talk to your doctor. Foods that contain iron include: whole grains/cereals, dried fruits, beans, or peas, leafy greenvegetables, and organ meats (liver, kidney). What side effects may I notice from receiving this medication? Side effects that you should report to your doctor or health care professionalas soon as possible: allergic reactions like skin rash, itching or hives, swelling of the face, lips, or tongue breathing problems changes in blood pressure feeling faint or lightheaded, falls fever or chills flushing, sweating, or hot feelings swelling of the ankles or feet Side effects that usually do not require medical attention (report to yourdoctor or health care professional if they continue or are bothersome): diarrhea headache nausea, vomiting stomach pain This list may not describe all possible side effects. Call your doctor for medical advice about side effects. You may report side effects to FDA at1-800-FDA-1088. Where should I keep my medication? This drug is given in a hospital or clinic and will not be stored at home. NOTE: This sheet is a summary. It may not cover all possible information. If you have questions about this medicine, talk to your doctor, pharmacist, orhealth care provider.  2022 Elsevier/Gold Standard (2016-08-29 20:21:10)  

## 2021-03-02 ENCOUNTER — Ambulatory Visit: Payer: Medicare Other | Admitting: Cardiology

## 2021-03-12 DIAGNOSIS — E559 Vitamin D deficiency, unspecified: Secondary | ICD-10-CM | POA: Diagnosis not present

## 2021-03-12 DIAGNOSIS — E039 Hypothyroidism, unspecified: Secondary | ICD-10-CM | POA: Diagnosis not present

## 2021-03-12 DIAGNOSIS — E1129 Type 2 diabetes mellitus with other diabetic kidney complication: Secondary | ICD-10-CM | POA: Diagnosis not present

## 2021-03-12 DIAGNOSIS — R809 Proteinuria, unspecified: Secondary | ICD-10-CM | POA: Diagnosis not present

## 2021-03-12 DIAGNOSIS — K130 Diseases of lips: Secondary | ICD-10-CM | POA: Diagnosis not present

## 2021-03-12 DIAGNOSIS — I10 Essential (primary) hypertension: Secondary | ICD-10-CM | POA: Diagnosis not present

## 2021-03-12 DIAGNOSIS — M810 Age-related osteoporosis without current pathological fracture: Secondary | ICD-10-CM | POA: Diagnosis not present

## 2021-03-12 DIAGNOSIS — D509 Iron deficiency anemia, unspecified: Secondary | ICD-10-CM | POA: Diagnosis not present

## 2021-03-12 DIAGNOSIS — E785 Hyperlipidemia, unspecified: Secondary | ICD-10-CM | POA: Diagnosis not present

## 2021-03-12 LAB — IRON,TIBC AND FERRITIN PANEL
%SAT: 31
Iron: 90
TIBC: 290
UIBC: 200

## 2021-03-12 LAB — BASIC METABOLIC PANEL
BUN: 13 (ref 4–21)
CO2: 24 — AB (ref 13–22)
Chloride: 107 (ref 99–108)
Creatinine: 0.8 (ref 0.5–1.1)
Glucose: 120
Potassium: 4.1 (ref 3.4–5.3)
Sodium: 143 (ref 137–147)

## 2021-03-12 LAB — COMPREHENSIVE METABOLIC PANEL
Albumin: 3.7 (ref 3.5–5.0)
Calcium: 9.4 (ref 8.7–10.7)
GFR calc non Af Amer: 79
Globulin: 2.6

## 2021-03-12 LAB — CBC AND DIFFERENTIAL
HCT: 32 — AB (ref 36–46)
Hemoglobin: 10.8 — AB (ref 12.0–16.0)
Neutrophils Absolute: 3
Platelets: 109 — AB (ref 150–399)
WBC: 5

## 2021-03-12 LAB — HEPATIC FUNCTION PANEL
ALT: 19 (ref 7–35)
AST: 34 (ref 13–35)
Alkaline Phosphatase: 68 (ref 25–125)
Bilirubin, Total: 0.8

## 2021-03-12 LAB — LIPID PANEL
Cholesterol: 134 (ref 0–200)
HDL: 59 (ref 35–70)
LDL Cholesterol: 60
LDl/HDL Ratio: 1
Triglycerides: 74 (ref 40–160)

## 2021-03-12 LAB — TSH: TSH: 3.11 (ref 0.41–5.90)

## 2021-03-12 LAB — VITAMIN D 25 HYDROXY (VIT D DEFICIENCY, FRACTURES): Vit D, 25-Hydroxy: 42.7

## 2021-03-12 LAB — HEMOGLOBIN A1C: Hemoglobin A1C: 7.2

## 2021-03-12 LAB — CBC: RBC: 3.28 — AB (ref 3.87–5.11)

## 2021-03-15 NOTE — Progress Notes (Signed)
[place]  [address] [place],  [place]  [address] [phone]  Clinic Day:  03/23/2021  Referring physician: Nicoletta Dress, MD  This document serves as a record of services personally performed by Hosie Poisson, MD. It was created on their behalf by [facility], a trained medical scribe. The creation of this record is based on the scribe's personal observations and the provider's statements to them.  CHIEF COMPLAINT:  CC:  Iron deficiency anemia  Current Treatment:   IV iron as needed  HISTORY OF PRESENT ILLNESS:  [name] is a 70 y.o. female  who we began seeing in December 2019 for iron deficiency anemia. In November 2018, colonoscopy revealed tubular adenomas.  Upper endoscopy in January 2018 did not reveal any specific findings.  She was clearly iron deficient.  She has multiple comorbidities, including diabetes, liver cirrhosis, COPD, hyperlipidemia, and degenerative disc disease.  Testing for any other deficiency or monoclonal spike was negative. She had already been on oral supplement for nearly 1 year with lack of response, so was given IV iron in the form of Feraheme.  She felt somewhat better after the IV iron, but still complained of fatigue and weakness.  She received IV Feraheme again in June.  Her hemoglobin improved, but was still low at 11.0.   Repeat colonoscopy and EGD were done in August 2020 by Dr. Noberto Retort.  Biopsy was negative.  In September 2020,her hemoglobin had dropped from 9.8 to 9.0 at Dr. Denton Lank office.  When we saw her later in September, the hemoglobin had dropped down to 7.5.  She was transfused with 2 units of packed red blood cells.  Repeat iron studies once again revealed iron deficiency, so was given IV Feraheme.  CT abdomen and pelvis was negative.  She felt so much better after the transfusion that she jumped out of bed and fractured her right foot in a fall.  She had a drop in her hemoglobin  again in November and was given IV Feraheme again.  Capsule endoscopy in November 2020 in [facility] revealed multiple AVM's of the small bowel.     We continued to follow her periodically. She had cauterization of an AVM of the small bowel at [facility] in February 2021. In March 2021, her hemoglobin dropped back down 7.6.  She therefore received IV Feraheme again in early April.  Abdominal ultrasound in May revealed cirrhosis of the liver with nodular surface, increased echogenicity and heterogeneity.  No focal liver parenchymal lesion was observed.  She has had intermittent mild thrombocytopenia, presumably from her liver cirrhosis. She received IV Feraheme in early June, but only one dose due to her leaving town.  She also received 2 units of packed red blood cells at that time. She presented to the [facility] emergency department later in June due to generalized weakness that had been ongoing for 1 week, as well as altered mental status, and was admitted.  She had just returned from a trip to [place].  While out of town, she developed severe epistaxis that required packing and 1 unit of packed red blood cells.  CT imaging revealed small amount of blood in the right maxillary and sphenoid sinuses with tube placed in the right paranasal sinus.  She was found to have a UTI.     She developed a severe epistaxis requiring hospital admission in late July 2021 and had a nasal bullet in place.  Her hemoglobin dropped down to  6.9, so she has been transfused, and given a dose of IV Feraheme while in the hospital.  She had an episode of hypotension and fever and was felt to be septic, so was transferred to the ICU.  She was placed on broad-spectrum IV antibiotics, and cultures remained negative.  Echocardiogram in July showed a suspicious mass in the right atrium, which may be attached to the interatrial septum.  The cardiologist reviewed the echocardiogram and felt that this was likely an artifact and not a mass,  vegetation or thrombus.  The patient continued to complain of pain from the compression device in her nose and described some green drainage from her sinuses, potentially the source of her fever and sepsis.  Her hemoglobin was 8.7 at the time of discharge in July.  She was seen in August and reported weakness of her hands and shakiness of her arms and legs.  She was occasionally dropping objects.  She also reported fatigue, dyspnea with exertion and moderate abdominal swelling.  She had a recent follow-up at Dr. Julien Nordmann office with a good cardiac report.  Due to the asterixis, an ammonia level was obtained, and was elevated at 62.  She was therefore placed on lactulose 30 mL twice a day. She was seen a week later with some improvement, but her serum ammonia was still 54, so the lactulose was increased to TID.  She also had hyperkalemia, so her spironolactone 25 mg was held. We then resumed one pill daily along with Lasix 40 mg daily due to recurrent ascites.  She was transfused with 2 units of PRBCs again at the end of September when her hemoglobin dropped down to 7.3.  Iron level is 13.0 with a TIBC of 415 for a saturation of 3.1%. She was scheduled for 2 more doses of IV Feraheme.  She was admitted in October  with a fever of 105 and had E coli sepsis. Lactulose was placed on hold due to severe diarrhea, adding to her dehydration. Her serum ammonia came down to 9, so we did not resume lactulose.  She did have a new splenic infarction as of this admission, which was causing a lot of left abdominal pain.  Her spironolactone was increased to 50 mg to take 1 to 2 daily.  At her visit in November, she says she was placed back on lactulose daily.  When she was seen in early February 2022, her CBC reveals a hemoglobin of 7.6 with normal ANC and platelets.  She was therefore transfused 2 units of PRBCs. Iron studies revealed recurrent iron deficiency.. Ammonia level was elevated at 35,  so her lactulose was increased  to 30 cc twice daily.  She was scheduled for IV iron.  She continued to have blood loss with black stools and one episode of moderate epistaxis,  She has been very frustrated by the constant recurrent bleeding, which we think is from the AVM's of the small bowel. She would like to pursue any possible procedures to address that and is scheduled to see the gastroenterologist at [facility] on May 27th.  She has continued to fluctuate up and down and has now seen Dr. Birdie Hopes, gastroenterology at [facility].  She had an EGD with plasma coagulation to cauterize two AVMs.  He has now recommended observation and will see her back if her hemoglobin drops again, and we document iron deficiency.    INTERVAL HISTORY:  [name] is here for routine follow up after receiving IV iron again in July/August.  She states that she has been including iron rich foods such as meat in her diet as well.  She did have an episode of epistaxis this morning, but this was mild and resolved with self packing.  She notes increased fatigue within the last week.  She continues to report cold intolerance.  Her hemoglobin was up to 10.8 with Dr. Delena Bali last month.  Her hemoglobin is nearly normal at 11.9, platelet count has mildly decreased from 118,000 to 103,000, and white count is normal.  Chemistries are unremarkable including a serum ammonia < 9.0.  Her  appetite is good, and she has lost 2 pounds since her last visit.  She denies fever, chills or other signs of infection.  She denies nausea, vomiting, bowel issues, or abdominal pain.  She denies sore throat, cough, dyspnea, or chest pain.  REVIEW OF SYSTEMS:  Review of Systems  Constitutional:  Positive for chills (cold intolerance) and fatigue. Negative for appetite change, fever and unexpected weight change.  HENT:   Positive for nosebleeds (1 episode, mild, resolved with self packing).   Eyes: Negative.   Respiratory: Negative.  Negative for chest tightness, cough, hemoptysis, shortness of  breath and wheezing.   Cardiovascular: Negative.  Negative for chest pain, leg swelling and palpitations.  Gastrointestinal: Negative.  Negative for abdominal distention, abdominal pain, blood in stool, constipation, diarrhea, nausea and vomiting.  Endocrine: Negative.   Genitourinary: Negative.  Negative for difficulty urinating, dysuria, frequency and hematuria.   Musculoskeletal: Negative.  Negative for arthralgias, back pain, flank pain, gait problem and myalgias.  Skin: Negative.   Neurological: Negative.  Negative for dizziness, extremity weakness, gait problem, headaches, light-headedness, numbness, seizures and speech difficulty.  Hematological: Negative.   Psychiatric/Behavioral: Negative.  Negative for depression and sleep disturbance. The patient is not nervous/anxious.    VITALS:  Blood pressure (!) 161/67, pulse 84, temperature 97.8 F (36.6 C), temperature source Oral, resp. rate 18, height '5\' 3"'$  (1.6 m), weight 168 lb 6.4 oz (76.4 kg), SpO2 99 %.  Wt Readings from Last 3 Encounters:  03/23/21 168 lb 6.4 oz (76.4 kg)  03/01/21 166 lb (75.3 kg)  02/22/21 170 lb 0.6 oz (77.1 kg)    Body mass index is 29.83 kg/m.  Performance status (ECOG): 1 - Symptomatic but completely ambulatory  PHYSICAL EXAM:  Physical Exam Constitutional:      General: She is not in acute distress.    Appearance: Normal appearance. She is normal weight.  HENT:     Head: Normocephalic and atraumatic.  Eyes:     General: No scleral icterus.    Extraocular Movements: Extraocular movements intact.     Conjunctiva/sclera: Conjunctivae normal.     Pupils: Pupils are equal, round, and reactive to light.  Cardiovascular:     Rate and Rhythm: Normal rate and regular rhythm.     Pulses: Normal pulses.     Heart sounds: Murmur heard.  Systolic murmur is present with a grade of 2/6.    No friction rub. No gallop.  Pulmonary:     Effort: Pulmonary effort is normal. No respiratory distress.     Breath  sounds: Normal breath sounds.  Abdominal:     General: Bowel sounds are normal. There is no distension.     Palpations: Abdomen is soft. There is no hepatomegaly, splenomegaly or mass.     Tenderness: There is no abdominal tenderness.     Comments: Mild to moderate ascites  Musculoskeletal:  General: Normal range of motion.     Cervical back: Normal range of motion and neck supple.     Right lower leg: No edema.     Left lower leg: No edema.  Lymphadenopathy:     Cervical: No cervical adenopathy.  Skin:    General: Skin is warm and dry.  Neurological:     General: No focal deficit present.     Mental Status: She is alert and oriented to person, place, and time. Mental status is at baseline.  Psychiatric:        Mood and Affect: Mood normal.        Behavior: Behavior normal.        Thought Content: Thought content normal.        Judgment: Judgment normal.   LABS:   CBC Latest Ref Rng & Units 03/23/2021 03/12/2021 02/16/2021  WBC - 6.2 5.0 4.3  Hemoglobin 12.0 - 16.0 11.9(A) 10.8(A) 9.6(A)  Hematocrit 36 - 46 36 32(A) 29(A)  Platelets 150 - 399 103(A) 109(A) 118(A)   CMP Latest Ref Rng & Units 03/23/2021 03/12/2021 02/16/2021  Glucose 70 - 99 mg/dL - - -  BUN 4 - '21 8 13 11  '$ Creatinine 0.5 - 1.1 0.6 0.8 0.7  Sodium 137 - 147 141 143 141  Potassium 3.4 - 5.3 4.1 4.1 3.5  Chloride 99 - 108 109(A) 107 107  CO2 13 - 22 22 24(A) 25(A)  Calcium 8.7 - 10.7 9.3 9.4 9.3  Total Protein 6.0 - 8.3 g/dL - - -  Total Bilirubin 0.3 - 1.2 mg/dL - - -  Alkaline Phos 25 - 125 77 68 63  AST 13 - 35 38(A) 34 38(A)  ALT 7 - 35 '20 19 23    '$ Lab Results  Component Value Date   TIBC 290 03/12/2021   TIBC 494 (H) 02/16/2021   TIBC 464 (H) 01/12/2021   FERRITIN 6 (L) 02/16/2021   FERRITIN 21 01/12/2021   FERRITIN 10 (L) 11/26/2020   IRONPCTSAT 31 03/12/2021   IRONPCTSAT 9 (L) 02/16/2021   IRONPCTSAT 21 01/12/2021    STUDIES:  No results found.    HISTORY:   Allergies: No Known  Allergies  Current Medications: Current Outpatient Medications  Medication Sig Dispense Refill   albuterol (VENTOLIN HFA) 108 (90 Base) MCG/ACT inhaler Inhale into the lungs.     colesevelam (WELCHOL) 625 MG tablet Take 1,875 mg by mouth 2 (two) times daily.     furosemide (LASIX) 40 MG tablet Take 1 tablet by mouth daily.     insulin aspart (NOVOLOG) 100 UNIT/ML FlexPen Inject 30 Units into the skin 2 (two) times daily.     lactulose (CHRONULAC) 10 GM/15ML solution Take 20 g by mouth 1 day or 1 dose.  5   LEVEMIR FLEXTOUCH 100 UNIT/ML Pen Inject 40 Units into the skin 2 (two) times daily.  12   levothyroxine (SYNTHROID) 50 MCG tablet Take 50 mcg by mouth daily.     liraglutide (VICTOZA) 18 MG/3ML SOPN Inject 1.8 mg into the skin daily.      lisinopril (PRINIVIL,ZESTRIL) 40 MG tablet Take 1 tablet by mouth daily.  3   metFORMIN (GLUCOPHAGE-XR) 500 MG 24 hr tablet Take 500 mg by mouth 2 (two) times daily with a meal.  5   montelukast (SINGULAIR) 10 MG tablet Take 10 mg by mouth at bedtime.     nystatin (MYCOSTATIN/NYSTOP) powder      omeprazole (PRILOSEC) 20 MG capsule Take 1 capsule  by mouth daily.  3   ondansetron (ZOFRAN) 4 MG tablet Take 1 tablet (4 mg total) by mouth every 4 (four) hours as needed for nausea. 90 tablet 3   oxyCODONE-acetaminophen (PERCOCET/ROXICET) 5-325 MG tablet Take 1 tablet by mouth every 4 (four) hours.     pravastatin (PRAVACHOL) 20 MG tablet Take 1 tablet by mouth once a week.     pregabalin (LYRICA) 75 MG capsule Take 75 mg by mouth at bedtime.  1   promethazine-codeine (PHENERGAN WITH CODEINE) 6.25-10 MG/5ML syrup Take 5 mLs by mouth every 6 (six) hours as needed.     rOPINIRole (REQUIP) 2 MG tablet Take 1 tablet by mouth at bedtime.  3   spironolactone (ALDACTONE) 50 MG tablet Take 1 tablet (50 mg total) by mouth daily. 90 tablet 1   valACYclovir (VALTREX) 1000 MG tablet Take 1,000 mg by mouth 2 (two) times daily.     Vitamin D, Ergocalciferol, (DRISDOL) 1.25  MG (50000 UT) CAPS capsule Take 1 capsule by mouth once a week.     No current facility-administered medications for this visit.     ASSESSMENT & PLAN:   Assessment:  1. Iron deficiency anemia, felt to be secondary to chronic GI blood loss from AVM's, as well as intermittent epistaxis. She saw Dr. Malissa Hippo at [facility] and he plans further evaluation.  She has required regular IV iron replacement and transfusions.  She had recurrent iron deficiency in May and again in July, so received IV Feraheme again with improvement in her hemoglobin.  It appears she may have finally stabilized and stopped bleeding.   2. Non-alcoholic liver cirrhosis, with intermittent mild thrombocytopenia.  Her anemia is also likely partly related to the cirrhosis.   3. Ascites, for which she is on spironolactone and furosemide. Her ascites has improved with increasing spironolactone to 50 mg daily.   4. Hepatic encephalopathy.  She had a slight increase in her serum ammonia due to partial noncompliance.  I advised her to continue her recommended dosing to avoid her ammonia level going up again and causing her symptoms.  5. Multiple AVM's of the small bowel seen on capsule endoscopy in [place], treated at [facility] in February 2021.   She saw Dr. Gershon Cull in June and two small AVMs were treated with argon plasma coagulation.    6.  Thrombocytopenia.  We will continue to monitor.  Plan:     Her hemoglobin is nearly normal at 11.9.  She knows to continue spironolactone 50 mg daily and furosemide 40 mg twice daily, as well as lactulose twice daily.  I will plan to see her back in 5 weeks with a CBC and comprehensive metabolic panel for repeat clinical assessment.  She will then follow up with Dr. Delena Bali in November.  The patient understands the plans discussed today and is in agreement with them. She knows to contact us if she develops symptoms of worsening anemia prior to that visit.    I, Rita Ohara, am  acting as scribe for Derwood Kaplan, MD  I have reviewed this report as typed by the medical scribe, and it is complete and accurate.

## 2021-03-16 ENCOUNTER — Encounter: Payer: Self-pay | Admitting: Hematology and Oncology

## 2021-03-23 ENCOUNTER — Other Ambulatory Visit: Payer: Self-pay | Admitting: Oncology

## 2021-03-23 ENCOUNTER — Encounter: Payer: Self-pay | Admitting: Oncology

## 2021-03-23 ENCOUNTER — Telehealth: Payer: Self-pay | Admitting: Oncology

## 2021-03-23 ENCOUNTER — Inpatient Hospital Stay: Payer: Medicare Other

## 2021-03-23 ENCOUNTER — Inpatient Hospital Stay (INDEPENDENT_AMBULATORY_CARE_PROVIDER_SITE_OTHER): Payer: Medicare Other | Admitting: Oncology

## 2021-03-23 VITALS — BP 161/67 | HR 84 | Temp 97.8°F | Resp 18 | Ht 63.0 in | Wt 168.4 lb

## 2021-03-23 DIAGNOSIS — K7581 Nonalcoholic steatohepatitis (NASH): Secondary | ICD-10-CM | POA: Diagnosis not present

## 2021-03-23 DIAGNOSIS — D500 Iron deficiency anemia secondary to blood loss (chronic): Secondary | ICD-10-CM

## 2021-03-23 DIAGNOSIS — E119 Type 2 diabetes mellitus without complications: Secondary | ICD-10-CM | POA: Diagnosis not present

## 2021-03-23 DIAGNOSIS — Z794 Long term (current) use of insulin: Secondary | ICD-10-CM | POA: Diagnosis not present

## 2021-03-23 DIAGNOSIS — K7460 Unspecified cirrhosis of liver: Secondary | ICD-10-CM

## 2021-03-23 DIAGNOSIS — K7290 Hepatic failure, unspecified without coma: Secondary | ICD-10-CM | POA: Diagnosis not present

## 2021-03-23 DIAGNOSIS — D509 Iron deficiency anemia, unspecified: Secondary | ICD-10-CM | POA: Diagnosis not present

## 2021-03-23 DIAGNOSIS — E785 Hyperlipidemia, unspecified: Secondary | ICD-10-CM | POA: Diagnosis not present

## 2021-03-23 DIAGNOSIS — D696 Thrombocytopenia, unspecified: Secondary | ICD-10-CM | POA: Diagnosis not present

## 2021-03-23 DIAGNOSIS — E875 Hyperkalemia: Secondary | ICD-10-CM | POA: Diagnosis not present

## 2021-03-23 DIAGNOSIS — K7682 Hepatic encephalopathy: Secondary | ICD-10-CM

## 2021-03-23 LAB — IRON AND TIBC
Iron: 57 ug/dL (ref 28–170)
Saturation Ratios: 16 % (ref 10.4–31.8)
TIBC: 359 ug/dL (ref 250–450)
UIBC: 302 ug/dL

## 2021-03-23 LAB — HEPATIC FUNCTION PANEL
ALT: 20 (ref 7–35)
AST: 38 — AB (ref 13–35)
Alkaline Phosphatase: 77 (ref 25–125)
Bilirubin, Total: 0.8

## 2021-03-23 LAB — CBC AND DIFFERENTIAL
HCT: 36 (ref 36–46)
Hemoglobin: 11.9 — AB (ref 12.0–16.0)
Neutrophils Absolute: 4.96
Platelets: 103 — AB (ref 150–399)
WBC: 6.2

## 2021-03-23 LAB — BASIC METABOLIC PANEL
BUN: 8 (ref 4–21)
CO2: 22 (ref 13–22)
Chloride: 109 — AB (ref 99–108)
Creatinine: 0.6 (ref 0.5–1.1)
Glucose: 165
Potassium: 4.1 (ref 3.4–5.3)
Sodium: 141 (ref 137–147)

## 2021-03-23 LAB — FERRITIN: Ferritin: 118 ng/mL (ref 11–307)

## 2021-03-23 LAB — CBC: RBC: 3.63 — AB (ref 3.87–5.11)

## 2021-03-23 LAB — AMMONIA: Ammonia: <9

## 2021-03-23 LAB — COMPREHENSIVE METABOLIC PANEL
Albumin: 3.9 (ref 3.5–5.0)
Calcium: 9.3 (ref 8.7–10.7)

## 2021-03-23 NOTE — Telephone Encounter (Signed)
Per 03/23/21 LOS next appt scheduled and confirmed by patient

## 2021-03-26 ENCOUNTER — Encounter: Payer: Self-pay | Admitting: Oncology

## 2021-03-26 ENCOUNTER — Telehealth: Payer: Self-pay

## 2021-03-26 NOTE — Telephone Encounter (Signed)
Patient notified

## 2021-03-26 NOTE — Telephone Encounter (Signed)
-----   Message from Derwood Kaplan, MD sent at 03/25/2021  7:23 PM EDT ----- Regarding: call pt Tell her iron levels finally look good, her bleeding may have finally stopped

## 2021-04-21 NOTE — Progress Notes (Signed)
[facility]  [address] [place],  [place]  [address] [phone]  Clinic Day:  04/28/2021  Referring physician: Nicoletta Dress, MD  This document serves as a record of services personally performed by Hosie Poisson, MD. It was created on their behalf by [facility], a trained medical scribe. The creation of this record is based on the scribe's personal observations and the provider's statements to them.  CHIEF COMPLAINT:  CC:  Iron deficiency anemia  Current Treatment:   IV iron as needed  HISTORY OF PRESENT ILLNESS:  [name] is a 70 y.o. female  who we began seeing in December 2019 for iron deficiency anemia. In November 2018, colonoscopy revealed tubular adenomas.  Upper endoscopy in January 2018 did not reveal any specific findings.  She was clearly iron deficient.  She has multiple comorbidities, including diabetes, liver cirrhosis, COPD, hyperlipidemia, and degenerative disc disease.  Testing for any other deficiency or monoclonal spike was negative. She had already been on oral supplement for nearly 1 year with lack of response, so was given IV iron in the form of Feraheme.  She felt somewhat better after the IV iron, but still complained of fatigue and weakness.  She received IV Feraheme again in June.  Her hemoglobin improved, but was still low at 11.0.   Repeat colonoscopy and EGD were done in August 2020 by Dr. Noberto Retort.  Biopsy was negative.  In September 2020,her hemoglobin had dropped from 9.8 to 9.0 at Dr. Denton Lank office.  When we saw her later in September, the hemoglobin had dropped down to 7.5.  She was transfused with 2 units of packed red blood cells.  Repeat iron studies once again revealed iron deficiency, so was given IV Feraheme.  CT abdomen and pelvis was negative.  She felt so much better after the transfusion that she jumped out of bed and fractured her right foot in a fall.  She had a drop in her hemoglobin  again in November and was given IV Feraheme again.  Capsule endoscopy in November 2020 in [facility] revealed multiple AVM's of the small bowel.  She had cauterization of an AVM of the small bowel at [facility] in February 2021. In March 2021, her hemoglobin dropped back down 7.6.  She therefore received IV Feraheme again in early April.  Abdominal ultrasound in May revealed cirrhosis of the liver with nodular surface, increased echogenicity and heterogeneity.  No focal liver parenchymal lesion was observed.  She has had intermittent mild thrombocytopenia, presumably from her liver cirrhosis. She received IV Feraheme in early June, but only one dose due to her leaving town.  She also received 2 units of packed red blood cells at that time. She was admitted to [facility] later in June with generalized weakness, altered mental status, and UTI.  While out of town, she developed severe epistaxis that required packing and 1 unit of packed red blood cells.  CT imaging revealed small amount of blood in the right maxillary and sphenoid sinuses with tube placed in the right paranasal sinus.     She developed a severe epistaxis requiring hospital admission in late July 2021 and had a nasal bullet in place.  Her hemoglobin dropped down to 6.9, she was transfused, and given a dose of IV Feraheme while in the hospital.  She had an episode of hypotension and fever and was felt to be septic, so was transferred to the ICU.  She was placed on  broad-spectrum IV antibiotics, and cultures remained negative.  Echocardiogram showed a suspicious mass in the right atrium, which may be attached to the interatrial septum.  The cardiologist reviewed the echocardiogram and felt that this was likely an artifact and not a mass, vegetation or thrombus. Her hemoglobin was 8.7 at the time of discharge, and she  was seen in August with weakness of her hands and shakiness of her arms and legs.  She was occasionally dropping objects.  She also  reported fatigue, dyspnea with exertion and moderate abdominal swelling.  She had asterixis, and ammonia level was elevated at 62.  She was therefore placed on lactulose 30 mL twice a day. She was seen a week later with some improvement, but her serum ammonia was still 54, so the lactulose was increased to TID.  She also had hyperkalemia, so her spironolactone 25 mg was held.  She was transfused with 2 units of PRBCs again at the end of September when her hemoglobin dropped down to 7.3.  Iron level was 13.0 with a TIBC of 415 for a saturation of 3.1%. She was scheduled for 2 more doses of IV Feraheme.  She was admitted in October  with a fever of 105 and had E coli sepsis. Lactulose was placed on hold due to severe diarrhea, adding to her dehydration. Her serum ammonia came down to 9, so we did not resume lactulose.  She did have a new splenic infarction as of this admission, which was causing a lot of left abdominal pain.  Her spironolactone was increased to 50 mg to take 1 to 2 daily.  At her visit in November, she was placed back on lactulose daily.  When she was seen in early February 2022, her CBC reveals a hemoglobin of 7.6 with normal ANC and platelets.  She was therefore transfused 2 units of PRBCs. Iron studies revealed recurrent iron deficiency.. Ammonia level was elevated at 35,  so her lactulose was increased to 30 cc twice daily.  She continued to have blood loss with black stools and one episode of moderate epistaxis,  She was scheduled to see the gastroenterologist at [facility] on May 27th, Dr. Birdie Hopes. She had an EGD with plasma coagulation to cauterize two AVM's.  He has now recommended observation and will see her back if her hemoglobin drops again, and we document iron deficiency.  She last received IV iron in July/August.  INTERVAL HISTORY:  [name] is here for routine follow up and states that she has been well. She denies any abdominal swelling. She has had a couple of minor nosebleeds.  Otherwise, she denies evidence of overt bleeding. She does report fatigue and cold intolerance. Hemoglobin has decreased from 11.9 to 11.0, platelets are fairly stable at 110,000, and white count is normal. Chemistries are unremarkable except for a non fasting glucose of 204. Her  appetite is good, and she has gained nearly 4 pounds since her last visit.  She denies fever, chills or other signs of infection.  She denies nausea, vomiting, bowel issues, or abdominal pain.  She denies sore throat, cough, dyspnea, or chest pain.  REVIEW OF SYSTEMS:  Review of Systems  Constitutional:  Positive for fatigue. Negative for appetite change, chills, fever and unexpected weight change.  HENT:   Positive for nosebleeds (on occasion, mild).   Eyes: Negative.   Respiratory: Negative.  Negative for chest tightness, cough, hemoptysis, shortness of breath and wheezing.   Cardiovascular: Negative.  Negative for chest pain, leg swelling  and palpitations.  Gastrointestinal: Negative.  Negative for abdominal distention, abdominal pain, blood in stool, constipation, diarrhea, nausea and vomiting.  Endocrine: Negative.   Genitourinary: Negative.  Negative for difficulty urinating, dysuria, frequency and hematuria.   Musculoskeletal: Negative.  Negative for arthralgias, back pain, flank pain, gait problem and myalgias.  Skin: Negative.   Neurological: Negative.  Negative for dizziness, extremity weakness, gait problem, headaches, light-headedness, numbness, seizures and speech difficulty.  Hematological: Negative.   Psychiatric/Behavioral: Negative.  Negative for depression and sleep disturbance. The patient is not nervous/anxious.    VITALS:  Blood pressure (!) 170/73, pulse 77, temperature 98.2 F (36.8 C), temperature source Oral, resp. rate 18, height 5\' 3"  (1.6 m), weight 171 lb 12.8 oz (77.9 kg), SpO2 98 %.  Wt Readings from Last 3 Encounters:  04/28/21 171 lb 12.8 oz (77.9 kg)  03/23/21 168 lb 6.4 oz (76.4 kg)   03/01/21 166 lb (75.3 kg)    Body mass index is 30.43 kg/m.  Performance status (ECOG): 1 - Symptomatic but completely ambulatory  PHYSICAL EXAM:  Physical Exam Constitutional:      General: She is not in acute distress.    Appearance: Normal appearance. She is normal weight.  HENT:     Head: Normocephalic and atraumatic.  Eyes:     General: No scleral icterus.    Extraocular Movements: Extraocular movements intact.     Conjunctiva/sclera: Conjunctivae normal.     Pupils: Pupils are equal, round, and reactive to light.  Cardiovascular:     Rate and Rhythm: Normal rate and regular rhythm.     Pulses: Normal pulses.     Heart sounds: Normal heart sounds. No murmur heard.   No friction rub. No gallop.  Pulmonary:     Effort: Pulmonary effort is normal. No respiratory distress.     Breath sounds: Normal breath sounds.  Abdominal:     General: Bowel sounds are normal. There is no distension.     Palpations: Abdomen is soft. There is hepatomegaly (mild, with some nodularity) and splenomegaly (mild). There is no mass.     Tenderness: There is no abdominal tenderness.  Musculoskeletal:        General: Normal range of motion.     Cervical back: Normal range of motion and neck supple.     Right lower leg: Edema (trace) present.     Left lower leg: Edema (trace) present.  Lymphadenopathy:     Cervical: No cervical adenopathy.  Skin:    General: Skin is warm and dry.  Neurological:     General: No focal deficit present.     Mental Status: She is alert and oriented to person, place, and time. Mental status is at baseline.  Psychiatric:        Mood and Affect: Mood normal.        Behavior: Behavior normal.        Thought Content: Thought content normal.        Judgment: Judgment normal.   LABS:   CBC Latest Ref Rng & Units 04/28/2021 03/23/2021 03/12/2021  WBC - 3.9 6.2 5.0  Hemoglobin 12.0 - 16.0 11.0(A) 11.9(A) 10.8(A)  Hematocrit 36 - 46 33(A) 36 32(A)  Platelets 150 - 399  110(A) 103(A) 109(A)   CMP Latest Ref Rng & Units 04/28/2021 03/23/2021 03/12/2021  Glucose 70 - 99 mg/dL - - -  BUN 4 - 21 10 8 13   Creatinine 0.5 - 1.1 0.7 0.6 0.8  Sodium 137 - 147 138 141  143  Potassium 3.4 - 5.3 3.6 4.1 4.1  Chloride 99 - 108 106 109(A) 107  CO2 13 - 22 25(A) 22 24(A)  Calcium 8.7 - 10.7 9.3 9.3 9.4  Total Protein 6.0 - 8.3 g/dL - - -  Total Bilirubin 0.3 - 1.2 mg/dL - - -  Alkaline Phos 25 - 125 - 77 68  AST 13 - 35 - 38(A) 34  ALT 7 - 35 - 20 19    Lab Results  Component Value Date   TIBC 359 03/23/2021   TIBC 290 03/12/2021   TIBC 494 (H) 02/16/2021   FERRITIN 118 03/23/2021   FERRITIN 6 (L) 02/16/2021   FERRITIN 21 01/12/2021   IRONPCTSAT 16 03/23/2021   IRONPCTSAT 31 03/12/2021   IRONPCTSAT 9 (L) 02/16/2021    STUDIES:  No results found.    HISTORY:   Allergies: No Known Allergies  Current Medications: Current Outpatient Medications  Medication Sig Dispense Refill   albuterol (VENTOLIN HFA) 108 (90 Base) MCG/ACT inhaler Inhale into the lungs.     colesevelam (WELCHOL) 625 MG tablet Take 1,875 mg by mouth 2 (two) times daily.     furosemide (LASIX) 40 MG tablet Take 1 tablet by mouth daily.     insulin aspart (NOVOLOG) 100 UNIT/ML FlexPen Inject 30 Units into the skin 2 (two) times daily.     lactulose (CHRONULAC) 10 GM/15ML solution Take 20 g by mouth 1 day or 1 dose.  5   LEVEMIR FLEXTOUCH 100 UNIT/ML Pen Inject 40 Units into the skin 2 (two) times daily.  12   levothyroxine (SYNTHROID) 50 MCG tablet Take 50 mcg by mouth daily.     liraglutide (VICTOZA) 18 MG/3ML SOPN Inject 1.8 mg into the skin daily.      lisinopril (PRINIVIL,ZESTRIL) 40 MG tablet Take 1 tablet by mouth daily.  3   metFORMIN (GLUCOPHAGE-XR) 500 MG 24 hr tablet Take 500 mg by mouth 2 (two) times daily with a meal.  5   montelukast (SINGULAIR) 10 MG tablet Take 10 mg by mouth at bedtime.     nystatin (MYCOSTATIN/NYSTOP) powder      omeprazole (PRILOSEC) 20 MG capsule Take  1 capsule by mouth daily.  3   ondansetron (ZOFRAN) 4 MG tablet Take 1 tablet (4 mg total) by mouth every 4 (four) hours as needed for nausea. 90 tablet 3   oxyCODONE-acetaminophen (PERCOCET/ROXICET) 5-325 MG tablet Take 1 tablet by mouth every 4 (four) hours.     pravastatin (PRAVACHOL) 20 MG tablet Take 1 tablet by mouth once a week.     pregabalin (LYRICA) 75 MG capsule Take 75 mg by mouth at bedtime.  1   promethazine-codeine (PHENERGAN WITH CODEINE) 6.25-10 MG/5ML syrup Take 5 mLs by mouth every 6 (six) hours as needed.     rOPINIRole (REQUIP) 2 MG tablet Take 1 tablet by mouth at bedtime.  3   spironolactone (ALDACTONE) 50 MG tablet Take 1 tablet (50 mg total) by mouth daily. 90 tablet 1   valACYclovir (VALTREX) 1000 MG tablet Take 1,000 mg by mouth 2 (two) times daily.     Vitamin D, Ergocalciferol, (DRISDOL) 1.25 MG (50000 UT) CAPS capsule Take 1 capsule by mouth once a week.     No current facility-administered medications for this visit.     ASSESSMENT & PLAN:   Assessment:  1. Iron deficiency anemia, felt to be secondary to chronic GI blood loss from AVM's, as well as intermittent epistaxis. She has required regular IV  iron replacement and transfusions.  She had recurrent iron deficiency in May and again in July, so received IV Feraheme again with improvement in her hemoglobin.  She saw Dr. Gershon Cull at [facility] and he did cauterize her AVM's.  It appears she may have finally stabilized and stopped bleeding.   2. Non-alcoholic liver cirrhosis, with intermittent mild thrombocytopenia.  Her anemia is also likely partly related to the cirrhosis.   3. Ascites, for which she is on spironolactone and furosemide. Her ascites has improved with increasing spironolactone to 50 mg daily.   4. Hepatic encephalopathy.  She had a slight increase in her serum ammonia due to partial noncompliance.  I advised her to continue recommended dosing of lactulose to avoid her ammonia level going up again and  causing her symptoms.  5. Multiple AVM's of the small bowel seen on capsule endoscopy in [place], treated at [facility] in February 2021.   She saw Dr. Gershon Cull in June and two small AVMs were treated with argon plasma coagulation.  So far her hemoglobin has been stable and she has not required further iron or transfusions.  6.  Thrombocytopenia, stable to improved.  We will continue to monitor.  Plan:     She knows to continue spironolactone 50 mg daily and furosemide 40 mg twice daily, as well as lactulose twice daily. She will then follow up with Dr. Delena Bali in November. I will plan to see her back in 2 months with a CBC, comprehensive metabolic panel and serum ammonia for repeat clinical assessment. The patient understands the plans discussed today and is in agreement with them. She knows to contact us if she develops symptoms of worsening anemia prior to that visit.    I, Rita Ohara, am acting as scribe for Derwood Kaplan, MD  I have reviewed this report as typed by the medical scribe, and it is complete and accurate.

## 2021-04-27 ENCOUNTER — Encounter: Payer: Self-pay | Admitting: Oncology

## 2021-04-28 ENCOUNTER — Other Ambulatory Visit: Payer: Self-pay | Admitting: Oncology

## 2021-04-28 ENCOUNTER — Other Ambulatory Visit: Payer: Self-pay | Admitting: Hematology and Oncology

## 2021-04-28 ENCOUNTER — Telehealth: Payer: Self-pay | Admitting: Oncology

## 2021-04-28 ENCOUNTER — Inpatient Hospital Stay: Payer: Medicare Other

## 2021-04-28 ENCOUNTER — Encounter: Payer: Self-pay | Admitting: Oncology

## 2021-04-28 ENCOUNTER — Inpatient Hospital Stay: Payer: Medicare Other | Attending: Oncology | Admitting: Oncology

## 2021-04-28 VITALS — BP 170/73 | HR 77 | Temp 98.2°F | Resp 18 | Ht 63.0 in | Wt 171.8 lb

## 2021-04-28 DIAGNOSIS — D500 Iron deficiency anemia secondary to blood loss (chronic): Secondary | ICD-10-CM

## 2021-04-28 DIAGNOSIS — K7460 Unspecified cirrhosis of liver: Secondary | ICD-10-CM | POA: Diagnosis not present

## 2021-04-28 DIAGNOSIS — D509 Iron deficiency anemia, unspecified: Secondary | ICD-10-CM | POA: Diagnosis not present

## 2021-04-28 DIAGNOSIS — K7581 Nonalcoholic steatohepatitis (NASH): Secondary | ICD-10-CM

## 2021-04-28 LAB — BASIC METABOLIC PANEL
BUN: 10 (ref 4–21)
CO2: 25 — AB (ref 13–22)
Chloride: 106 (ref 99–108)
Creatinine: 0.7 (ref 0.5–1.1)
Glucose: 204
Potassium: 3.6 (ref 3.4–5.3)
Sodium: 138 (ref 137–147)

## 2021-04-28 LAB — CBC AND DIFFERENTIAL
HCT: 33 — AB (ref 36–46)
Hemoglobin: 11 — AB (ref 12.0–16.0)
Neutrophils Absolute: 2.38
Platelets: 110 — AB (ref 150–399)
WBC: 3.9

## 2021-04-28 LAB — COMPREHENSIVE METABOLIC PANEL
Albumin: 3.8 (ref 3.5–5.0)
Calcium: 9.3 (ref 8.7–10.7)

## 2021-04-28 LAB — CBC: RBC: 3.35 — AB (ref 3.87–5.11)

## 2021-04-28 NOTE — Telephone Encounter (Signed)
Per 10/5 LOS next appt scheduled and given to patient

## 2021-05-03 ENCOUNTER — Encounter: Payer: Self-pay | Admitting: Oncology

## 2021-05-06 DIAGNOSIS — L570 Actinic keratosis: Secondary | ICD-10-CM | POA: Diagnosis not present

## 2021-05-26 DIAGNOSIS — J209 Acute bronchitis, unspecified: Secondary | ICD-10-CM | POA: Diagnosis not present

## 2021-05-26 DIAGNOSIS — J440 Chronic obstructive pulmonary disease with acute lower respiratory infection: Secondary | ICD-10-CM | POA: Diagnosis not present

## 2021-05-28 DIAGNOSIS — M810 Age-related osteoporosis without current pathological fracture: Secondary | ICD-10-CM | POA: Diagnosis not present

## 2021-05-28 DIAGNOSIS — Z1231 Encounter for screening mammogram for malignant neoplasm of breast: Secondary | ICD-10-CM | POA: Diagnosis not present

## 2021-05-28 DIAGNOSIS — N959 Unspecified menopausal and perimenopausal disorder: Secondary | ICD-10-CM | POA: Diagnosis not present

## 2021-05-28 LAB — HM DEXA SCAN

## 2021-06-02 ENCOUNTER — Other Ambulatory Visit: Payer: Self-pay

## 2021-06-02 ENCOUNTER — Telehealth: Payer: Self-pay

## 2021-06-02 ENCOUNTER — Inpatient Hospital Stay: Payer: Medicare Other | Attending: Oncology

## 2021-06-02 ENCOUNTER — Other Ambulatory Visit: Payer: Self-pay | Admitting: Oncology

## 2021-06-02 ENCOUNTER — Encounter: Payer: Self-pay | Admitting: Oncology

## 2021-06-02 ENCOUNTER — Inpatient Hospital Stay (HOSPITAL_BASED_OUTPATIENT_CLINIC_OR_DEPARTMENT_OTHER): Payer: Medicare Other | Admitting: Oncology

## 2021-06-02 VITALS — BP 208/90 | HR 80 | Temp 98.1°F | Resp 20 | Ht 63.0 in | Wt 177.3 lb

## 2021-06-02 DIAGNOSIS — R188 Other ascites: Secondary | ICD-10-CM

## 2021-06-02 DIAGNOSIS — K7581 Nonalcoholic steatohepatitis (NASH): Secondary | ICD-10-CM | POA: Diagnosis not present

## 2021-06-02 DIAGNOSIS — D696 Thrombocytopenia, unspecified: Secondary | ICD-10-CM

## 2021-06-02 DIAGNOSIS — K7460 Unspecified cirrhosis of liver: Secondary | ICD-10-CM | POA: Diagnosis not present

## 2021-06-02 DIAGNOSIS — D649 Anemia, unspecified: Secondary | ICD-10-CM | POA: Diagnosis not present

## 2021-06-02 DIAGNOSIS — D500 Iron deficiency anemia secondary to blood loss (chronic): Secondary | ICD-10-CM

## 2021-06-02 LAB — CBC AND DIFFERENTIAL
HCT: 32 — AB (ref 36–46)
Hemoglobin: 11 — AB (ref 12.0–16.0)
Neutrophils Absolute: 4.94
Platelets: 131 — AB (ref 150–399)
WBC: 6.5

## 2021-06-02 LAB — COMPREHENSIVE METABOLIC PANEL
Albumin: 3.7 (ref 3.5–5.0)
Calcium: 9.6 (ref 8.7–10.7)

## 2021-06-02 LAB — HEPATIC FUNCTION PANEL
ALT: 24 (ref 7–35)
AST: 35 (ref 13–35)
Alkaline Phosphatase: 90 (ref 25–125)
Bilirubin, Total: 1.3

## 2021-06-02 LAB — BASIC METABOLIC PANEL
BUN: 18 (ref 4–21)
CO2: 26 — AB (ref 13–22)
Chloride: 105 (ref 99–108)
Creatinine: 0.8 (ref 0.5–1.1)
Glucose: 461
Potassium: 4 (ref 3.4–5.3)
Sodium: 138 (ref 137–147)

## 2021-06-02 LAB — AMMONIA: Ammonia: <9

## 2021-06-02 LAB — CBC: RBC: 3.28 — AB (ref 3.87–5.11)

## 2021-06-02 NOTE — Telephone Encounter (Addendum)
Pt will be here soon. [name] and [name] notified.   RE: Pt feels weak, thinks her counts are low. Received: Today Derwood Kaplan, MD  Dairl Ponder, RN Yes, she can drop quickly sometimes, let's get her in here for labs, If she could come right away, I have an opening at 4 pm and could see her and the labs.   Pt reports that she feels weak. She admits to intermittent dizziness/lightheadedness, and SOB on exertion. She did have 1 episode last week of SOB while lying in bed. "I feel like my labs maybe low and Dr Hinton Rao told me to call if I felt weak". Afebrile. No URI s/s.

## 2021-06-02 NOTE — Progress Notes (Signed)
[place]  [address] [place],  [place]  [address] [phone]  Clinic Day:  06/02/2021  Referring physician: Nicoletta Dress, MD  This document serves as a record of services personally performed by Hosie Poisson, MD. It was created on their behalf by [facility], a trained medical scribe. The creation of this record is based on the scribe's personal observations and the provider's statements to them.  CHIEF COMPLAINT:  CC:  Iron deficiency anemia  Current Treatment:   IV iron as needed  HISTORY OF PRESENT ILLNESS:  [name] is a 70 y.o. female  who we began seeing in December 2019 for iron deficiency anemia. In November 2018, colonoscopy revealed tubular adenomas.  Upper endoscopy in January 2018 did not reveal any specific findings.  She was clearly iron deficient.  She has multiple comorbidities, including diabetes, liver cirrhosis, COPD, hyperlipidemia, and degenerative disc disease.  Testing for any other deficiency or monoclonal spike was negative. She had already been on oral supplement for nearly 1 year with lack of response, so was given IV iron in the form of Feraheme on several occasions. Repeat colonoscopy and EGD were done in August 2020 by Dr. Noberto Retort.  Biopsy was negative.  When we saw her later in September, the hemoglobin had dropped down to 7.5.  She was transfused with 2 units of packed red blood cells.  Repeat iron studies once again revealed iron deficiency, so was given IV Feraheme.  CT abdomen and pelvis was negative.  She felt so much better after the transfusion that she jumped out of bed and fractured her right foot in a fall.  She was given IV Feraheme again in November.  Capsule endoscopy in November 2020 in [facility] revealed multiple AVM's of the small bowel.  She had cauterization of an AVM of the small bowel at [facility] in February 2021. In March 2021, her hemoglobin dropped back down 7.6.  She  therefore received IV Feraheme again in early April.  Abdominal ultrasound in May revealed cirrhosis of the liver with nodular surface, increased echogenicity and heterogeneity.  No focal liver parenchymal lesion was observed.  She has had intermittent mild thrombocytopenia, presumably from her liver cirrhosis. She received IV Feraheme in June 2021, and received 2 units of packed red blood cells at that time. She was admitted to [facility] later in June with generalized weakness, altered mental status, and UTI.  While out of town, she developed severe epistaxis that required packing and 1 unit of packed red blood cells.  CT imaging revealed small amount of blood in the right maxillary and sphenoid sinuses with tube placed in the right paranasal sinus.     She developed a severe epistaxis requiring hospital admission in late July 2021 and had a nasal bullet in place.  Her hemoglobin dropped down to 6.9, she was transfused, and given a dose of IV Feraheme while in the hospital.  She had an episode of hypotension and fever and was felt to be septic, so was transferred to the ICU.  She was placed on broad-spectrum IV antibiotics, and cultures remained negative.  Echocardiogram showed a suspicious mass in the right atrium, which may be attached to the interatrial septum.  The cardiologist reviewed the echocardiogram and felt that this was likely an artifact and not a mass, vegetation or thrombus. Her hemoglobin was 8.7 at the time of discharge, and she  was seen in August with weakness of her hands,shakiness,  dropping objects, fatigue, dyspnea with exertion and moderate abdominal swelling.  She had asterixis, and ammonia level was elevated at 62.  She was therefore placed on lactulose 30 mL twice a day. She was seen a week later with some improvement, but her serum ammonia was still 54, so the lactulose was increased to TID.  She also had hyperkalemia, so her spironolactone 25 mg was held.  She was transfused with 2  units of PRBCs again at the end of September when her hemoglobin dropped down to 7.3.  Iron level was 13.0 with a TIBC of 415 for a saturation of 3.1%. She was scheduled for 2 more doses of IV Feraheme.  She was admitted in October  with a fever of 105 and had E coli sepsis. Lactulose was placed on hold due to severe diarrhea, adding to her dehydration. Her serum ammonia came down to 9, so we did not resume lactulose.  She did have a new splenic infarction as of this admission, which was causing a lot of left abdominal pain.  Her spironolactone was increased to 50 mg to take 1 to 2 daily.  At her visit in November, she was placed back on lactulose daily.  When she was seen in early February 2022, her CBC reveals a hemoglobin of 7.6 with normal ANC and platelets.  She was therefore transfused 2 units of PRBCs. Iron studies revealed recurrent iron deficiency.. Ammonia level was elevated at 35,  so her lactulose was increased to 30 cc twice daily.  She continued to have blood loss with black stools and one episode of moderate epistaxis,  She saw the gastroenterologist at [facility] on May 27th, Dr. Birdie Hopes, and had an EGD with plasma coagulation to cauterize two AVM's.  He has now recommended observation and will see her back if her hemoglobin drops again, and we document iron deficiency.  She last received IV iron in July/August.  INTERVAL HISTORY:  [name] has been added onto the schedule today due to worsening fatigue and sleepiness, shortness of breath, and abdominal distension for the past 2-3 days. She also notes that her stools are light brown in color.  She denies fever, chills or other signs of infection.  She has had some mild nausea. She was recently given a Kenalog injection by Dr. Delena Bali and was placed on oral prednisone for congestion, but only took 4 pills. She continues spironolactone 25 mg BID. I advised that she increase this to 50 mg BID to take off some of the fluid. Hemoglobin had dropped from  11.9 to 11.0 in October/September, but now remains stable at 11.0, and white count and platelets are normal. Chemistries are unremarkable except for a blood glucose of 461. Serum ammonia is less than 9.0. Her  appetite is good, and she has gained 5 1/2 pounds since her last visit. She denies vomiting, bowel issues, or abdominal pain.  She denies sore throat, cough or chest pain.  REVIEW OF SYSTEMS:  Review of Systems  Constitutional:  Positive for fatigue. Negative for appetite change, chills, fever and unexpected weight change.  Eyes: Negative.   Respiratory:  Positive for shortness of breath. Negative for chest tightness, cough, hemoptysis and wheezing.   Cardiovascular: Negative.  Negative for chest pain, leg swelling and palpitations.  Gastrointestinal:  Positive for abdominal distention and nausea (very mild). Negative for abdominal pain, blood in stool, constipation, diarrhea and vomiting.       Whitish stools  Endocrine: Negative.   Genitourinary: Negative.  Negative  for difficulty urinating, dysuria, frequency and hematuria.   Musculoskeletal: Negative.  Negative for arthralgias, back pain, flank pain, gait problem and myalgias.  Skin: Negative.   Neurological: Negative.  Negative for dizziness, extremity weakness, gait problem, headaches, light-headedness, numbness, seizures and speech difficulty.  Hematological: Negative.   Psychiatric/Behavioral: Negative.  Negative for depression and sleep disturbance. The patient is not nervous/anxious.    VITALS:  Blood pressure (!) 208/90, pulse 80, temperature 98.1 F (36.7 C), temperature source Oral, resp. rate 20, height 5\' 3"  (1.6 m), weight 177 lb 4.8 oz (80.4 kg), SpO2 98 %.  Wt Readings from Last 3 Encounters:  06/02/21 177 lb 4.8 oz (80.4 kg)  04/28/21 171 lb 12.8 oz (77.9 kg)  03/23/21 168 lb 6.4 oz (76.4 kg)    Body mass index is 31.41 kg/m.  Performance status (ECOG): 1 - Symptomatic but completely ambulatory  PHYSICAL EXAM:   Physical Exam Constitutional:      General: She is not in acute distress.    Appearance: Normal appearance. She is normal weight.  HENT:     Head: Normocephalic and atraumatic.  Eyes:     General: No scleral icterus.    Extraocular Movements: Extraocular movements intact.     Conjunctiva/sclera: Conjunctivae normal.     Pupils: Pupils are equal, round, and reactive to light.  Cardiovascular:     Rate and Rhythm: Normal rate and regular rhythm.     Pulses: Normal pulses.     Heart sounds: Murmur heard.  Systolic murmur is present with a grade of 2/6.    No friction rub. No gallop.  Pulmonary:     Effort: Pulmonary effort is normal. No respiratory distress.     Breath sounds: Normal breath sounds.  Abdominal:     General: Bowel sounds are normal. There is distension (with moderate to severe ascites).     Palpations: Abdomen is soft. There is hepatomegaly (noudlar left lobe of the liver). There is no splenomegaly or mass.     Tenderness: There is no abdominal tenderness.  Musculoskeletal:        General: Normal range of motion.     Cervical back: Normal range of motion and neck supple.     Right lower leg: No edema.     Left lower leg: No edema.  Lymphadenopathy:     Cervical: No cervical adenopathy.  Skin:    General: Skin is warm and dry.  Neurological:     General: No focal deficit present.     Mental Status: She is alert and oriented to person, place, and time. Mental status is at baseline.  Psychiatric:        Mood and Affect: Mood normal.        Behavior: Behavior normal.        Thought Content: Thought content normal.        Judgment: Judgment normal.   LABS:   CBC Latest Ref Rng & Units 06/02/2021 04/28/2021 03/23/2021  WBC - 6.5 3.9 6.2  Hemoglobin 12.0 - 16.0 11.0(A) 11.0(A) 11.9(A)  Hematocrit 36 - 46 32(A) 33(A) 36  Platelets 150 - 399 131(A) 110(A) 103(A)   CMP Latest Ref Rng & Units 06/02/2021 04/28/2021 03/23/2021  Glucose 70 - 99 mg/dL - - -  BUN 4 - 21 18  10 8   Creatinine 0.5 - 1.1 0.8 0.7 0.6  Sodium 137 - 147 138 138 141  Potassium 3.4 - 5.3 4.0 3.6 4.1  Chloride 99 - 108 105 106 109(A)  CO2 13 - 22 26(A) 25(A) 22  Calcium 8.7 - 10.7 9.6 9.3 9.3  Total Protein 6.0 - 8.3 g/dL - - -  Total Bilirubin 0.3 - 1.2 mg/dL - - -  Alkaline Phos 25 - 125 90 - 77  AST 13 - 35 35 - 38(A)  ALT 7 - 35 24 - 20    Lab Results  Component Value Date   TIBC 359 03/23/2021   TIBC 290 03/12/2021   TIBC 494 (H) 02/16/2021   FERRITIN 118 03/23/2021   FERRITIN 6 (L) 02/16/2021   FERRITIN 21 01/12/2021   IRONPCTSAT 16 03/23/2021   IRONPCTSAT 31 03/12/2021   IRONPCTSAT 9 (L) 02/16/2021    STUDIES:  No results found.    HISTORY:   Allergies: No Known Allergies  Current Medications: Current Outpatient Medications  Medication Sig Dispense Refill   albuterol (VENTOLIN HFA) 108 (90 Base) MCG/ACT inhaler Inhale into the lungs.     colesevelam (WELCHOL) 625 MG tablet Take 1,875 mg by mouth 2 (two) times daily.     furosemide (LASIX) 40 MG tablet Take 1 tablet by mouth daily.     insulin aspart (NOVOLOG) 100 UNIT/ML FlexPen Inject 30 Units into the skin 2 (two) times daily.     lactulose (CHRONULAC) 10 GM/15ML solution Take 20 g by mouth 1 day or 1 dose.  5   LEVEMIR FLEXTOUCH 100 UNIT/ML Pen Inject 40 Units into the skin 2 (two) times daily.  12   levofloxacin (LEVAQUIN) 750 MG tablet Take 750 mg by mouth daily.     levothyroxine (SYNTHROID) 50 MCG tablet Take 50 mcg by mouth daily.     liraglutide (VICTOZA) 18 MG/3ML SOPN Inject 1.8 mg into the skin daily.      lisinopril (PRINIVIL,ZESTRIL) 40 MG tablet Take 1 tablet by mouth daily.  3   metFORMIN (GLUCOPHAGE-XR) 500 MG 24 hr tablet Take 500 mg by mouth 2 (two) times daily with a meal.  5   montelukast (SINGULAIR) 10 MG tablet Take 10 mg by mouth at bedtime.     nystatin (MYCOSTATIN/NYSTOP) powder      omeprazole (PRILOSEC) 20 MG capsule Take 1 capsule by mouth daily.  3   ondansetron (ZOFRAN) 4  MG tablet Take 1 tablet (4 mg total) by mouth every 4 (four) hours as needed for nausea. 90 tablet 3   oxyCODONE-acetaminophen (PERCOCET/ROXICET) 5-325 MG tablet Take 1 tablet by mouth every 4 (four) hours.     pravastatin (PRAVACHOL) 20 MG tablet Take 1 tablet by mouth once a week.     predniSONE (DELTASONE) 10 MG tablet Take by mouth.     pregabalin (LYRICA) 75 MG capsule Take 75 mg by mouth at bedtime.  1   promethazine-codeine (PHENERGAN WITH CODEINE) 6.25-10 MG/5ML syrup Take 5 mLs by mouth every 6 (six) hours as needed.     rOPINIRole (REQUIP) 2 MG tablet Take 1 tablet by mouth at bedtime.  3   spironolactone (ALDACTONE) 50 MG tablet Take 1 tablet (50 mg total) by mouth daily. 90 tablet 1   valACYclovir (VALTREX) 1000 MG tablet Take 1,000 mg by mouth 2 (two) times daily.     Vitamin D, Ergocalciferol, (DRISDOL) 1.25 MG (50000 UT) CAPS capsule Take 1 capsule by mouth once a week.     No current facility-administered medications for this visit.     ASSESSMENT & PLAN:   Assessment:  1. Iron deficiency anemia, felt to be secondary to chronic GI blood loss from AVM's, as  well as intermittent epistaxis. She has required regular IV iron replacement and transfusions.  She saw Dr. Gershon Cull at [facility] and he did cauterize her AVM's.  It appears she may have finally stabilized and stopped bleeding. She has not required transfusions or IV iron since the summer.   2. Non-alcoholic liver cirrhosis, with intermittent mild thrombocytopenia.  Her anemia is also likely partly related to the cirrhosis.   3. Ascites, for which she is on spironolactone and furosemide. Her ascites has worsened and so she will increase the spironolactone to 50 mg BID.   4. Hepatic encephalopathy.  She had a slight increase in her serum ammonia due to partial noncompliance. She now continues recommended dosing of lactulose to avoid her ammonia level going up again and causing her symptoms. Her serum ammonia is normal today.  5.  Multiple AVM's of the small bowel seen on capsule endoscopy in [place], treated at [facility] in February 2021.   She saw Dr. Gershon Cull in June and two small AVMs were treated with argon plasma coagulation.  So far her hemoglobin has been stable and she has not required further iron or transfusions.  6.  Thrombocytopenia, improved.  We will continue to monitor.  7. Severe hyperglycemia with a blood sugar of 461. She has been instructed to go home and take medication and may need to see Dr. Delena Bali sooner.   Plan:     In view of her severe fatigue and abdominal distention, I advised that she increase the spironolactone to 50 mg BID and take her insulin as soon as she returns home. Thankfully, her hemoglobin remains stable and the serum ammonia is in a normal range. She knows to continue furosemide 40 mg twice daily, as well as lactulose twice daily. We will see her back in December as scheduled with a CBC, comprehensive metabolic panel and serum ammonia for repeat clinical assessment. The patient understands the plans discussed today and is in agreement with them. She knows to contact us if she develops symptoms of worsening anemia prior to that visit.  I provided 15 minutes of face-to-face time during this this encounter and > 50% was spent counseling as documented under my assessment and plan.    I, Rita Ohara, am acting as scribe for Derwood Kaplan, MD  I have reviewed this report as typed by the medical scribe, and it is complete and accurate.

## 2021-06-02 NOTE — Telephone Encounter (Signed)
error 

## 2021-06-07 ENCOUNTER — Telehealth: Payer: Self-pay | Admitting: Oncology

## 2021-06-07 ENCOUNTER — Encounter: Payer: Self-pay | Admitting: Oncology

## 2021-06-07 NOTE — Telephone Encounter (Signed)
11/9 No LOS Entered

## 2021-06-14 DIAGNOSIS — Z23 Encounter for immunization: Secondary | ICD-10-CM | POA: Diagnosis not present

## 2021-06-14 DIAGNOSIS — E039 Hypothyroidism, unspecified: Secondary | ICD-10-CM | POA: Diagnosis not present

## 2021-06-14 DIAGNOSIS — E1129 Type 2 diabetes mellitus with other diabetic kidney complication: Secondary | ICD-10-CM | POA: Diagnosis not present

## 2021-06-14 DIAGNOSIS — E559 Vitamin D deficiency, unspecified: Secondary | ICD-10-CM | POA: Diagnosis not present

## 2021-06-14 DIAGNOSIS — E785 Hyperlipidemia, unspecified: Secondary | ICD-10-CM | POA: Diagnosis not present

## 2021-06-14 DIAGNOSIS — R809 Proteinuria, unspecified: Secondary | ICD-10-CM | POA: Diagnosis not present

## 2021-06-14 DIAGNOSIS — R188 Other ascites: Secondary | ICD-10-CM | POA: Diagnosis not present

## 2021-06-14 DIAGNOSIS — I10 Essential (primary) hypertension: Secondary | ICD-10-CM | POA: Diagnosis not present

## 2021-06-14 DIAGNOSIS — D509 Iron deficiency anemia, unspecified: Secondary | ICD-10-CM | POA: Diagnosis not present

## 2021-06-14 DIAGNOSIS — M810 Age-related osteoporosis without current pathological fracture: Secondary | ICD-10-CM | POA: Diagnosis not present

## 2021-06-21 NOTE — Progress Notes (Incomplete)
[place]  [address] [place],  [place]  [address] [phone]  Clinic Day:  06/21/2021  Referring physician: Nicoletta Dress, MD  This document serves as a record of services personally performed by Hosie Poisson, MD. It was created on their behalf by [facility], a trained medical scribe. The creation of this record is based on the scribe's personal observations and the provider's statements to them.  CHIEF COMPLAINT:  CC:  Iron deficiency anemia  Current Treatment:   IV iron as needed  HISTORY OF PRESENT ILLNESS:  [name] is a 70 y.o. female  who we began seeing in December 2019 for iron deficiency anemia. In November 2018, colonoscopy revealed tubular adenomas.  Upper endoscopy in January 2018 did not reveal any specific findings.  She was clearly iron deficient.  She has multiple comorbidities, including diabetes, liver cirrhosis, COPD, hyperlipidemia, and degenerative disc disease.  Testing for any other deficiency or monoclonal spike was negative. She had already been on oral supplement for nearly 1 year with lack of response, so was given IV iron in the form of Feraheme on several occasions. Repeat colonoscopy and EGD were done in August 2020 by Dr. Noberto Retort.  Biopsy was negative.  When we saw her later in September, the hemoglobin had dropped down to 7.5.  She was transfused with 2 units of packed red blood cells.  Repeat iron studies once again revealed iron deficiency, so was given IV Feraheme.  CT abdomen and pelvis was negative.  She felt so much better after the transfusion that she jumped out of bed and fractured her right foot in a fall.  She was given IV Feraheme again in November.  Capsule endoscopy in November 2020 in [facility] revealed multiple AVM's of the small bowel.  She had cauterization of an AVM of the small bowel at [facility] in February 2021. In March 2021, her hemoglobin dropped back down 7.6.  She  therefore received IV Feraheme again in early April.  Abdominal ultrasound in May revealed cirrhosis of the liver with nodular surface, increased echogenicity and heterogeneity.  No focal liver parenchymal lesion was observed.  She has had intermittent mild thrombocytopenia, presumably from her liver cirrhosis. She received IV Feraheme in June 2021, and received 2 units of packed red blood cells at that time. She was admitted to [facility] later in June with generalized weakness, altered mental status, and UTI.  While out of town, she developed severe epistaxis that required packing and 1 unit of packed red blood cells.  CT imaging revealed small amount of blood in the right maxillary and sphenoid sinuses with tube placed in the right paranasal sinus.     She developed a severe epistaxis requiring hospital admission in late July 2021 and had a nasal bullet in place.  Her hemoglobin dropped down to 6.9, she was transfused, and given a dose of IV Feraheme while in the hospital.  She had an episode of hypotension and fever and was felt to be septic, so was transferred to the ICU.  She was placed on broad-spectrum IV antibiotics, and cultures remained negative.  Echocardiogram showed a suspicious mass in the right atrium, which may be attached to the interatrial septum.  The cardiologist reviewed the echocardiogram and felt that this was likely an artifact and not a mass, vegetation or thrombus. Her hemoglobin was 8.7 at the time of discharge, and she  was seen in August with weakness of her hands,shakiness,  dropping objects, fatigue, dyspnea with exertion and moderate abdominal swelling.  She had asterixis, and ammonia level was elevated at 62.  She was therefore placed on lactulose 30 mL twice a day. She was seen a week later with some improvement, but her serum ammonia was still 54, so the lactulose was increased to TID.  She also had hyperkalemia, so her spironolactone 25 mg was held.  She was transfused with 2  units of PRBCs again at the end of September when her hemoglobin dropped down to 7.3.  Iron level was 13.0 with a TIBC of 415 for a saturation of 3.1%. She was scheduled for 2 more doses of IV Feraheme.  She was admitted in October  with a fever of 105 and had E coli sepsis. Lactulose was placed on hold due to severe diarrhea, adding to her dehydration. Her serum ammonia came down to 9, so we did not resume lactulose.  She did have a new splenic infarction as of this admission, which was causing a lot of left abdominal pain.  Her spironolactone was increased to 50 mg to take 1 to 2 daily.  At her visit in November, she was placed back on lactulose daily.  When she was seen in early February 2022, her CBC reveals a hemoglobin of 7.6 with normal ANC and platelets.  She was therefore transfused 2 units of PRBCs. Iron studies revealed recurrent iron deficiency.. Ammonia level was elevated at 35,  so her lactulose was increased to 30 cc twice daily.  She continued to have blood loss with black stools and one episode of moderate epistaxis,  She saw the gastroenterologist at [facility] on May 27th, Dr. Birdie Hopes, and had an EGD with plasma coagulation to cauterize two AVM's.  He has now recommended observation and will see her back if her hemoglobin drops again, and we document iron deficiency.  She last received IV iron in July/August.  INTERVAL HISTORY:  [name] has been added onto the schedule today due to worsening fatigue and sleepiness, shortness of breath, and abdominal distension for the past 2-3 days. She also notes that her stools are light brown in color.  She denies fever, chills or other signs of infection.  She has had some mild nausea. She was recently given a Kenalog injection by Dr. Delena Bali and was placed on oral prednisone for congestion, but only took 4 pills. She continues spironolactone 25 mg BID. I advised that she increase this to 50 mg BID to take off some of the fluid. Hemoglobin had dropped from  11.9 to 11.0 in October/September, but now remains stable at 11.0, and white count and platelets are normal. Chemistries are unremarkable except for a blood glucose of 461. Serum ammonia is less than 9.0. Her  appetite is good, and she has gained 5 1/2 pounds since her last visit. She denies vomiting, bowel issues, or abdominal pain.  She denies sore throat, cough or chest pain.  [name] is here for routine follow up ***.   Her  appetite is good, and she has gained/lost _ pounds since her last visit.  She denies fever, chills or other signs of infection.  She denies nausea, vomiting, bowel issues, or abdominal pain.  She denies sore throat, cough, dyspnea, or chest pain.  REVIEW OF SYSTEMS:  Review of Systems  Constitutional:  Positive for fatigue. Negative for appetite change, chills, fever and unexpected weight change.  Eyes: Negative.   Respiratory:  Negative for chest tightness, cough, hemoptysis, shortness of breath  and wheezing.   Cardiovascular: Negative.  Negative for chest pain, leg swelling and palpitations.  Gastrointestinal:  Negative for abdominal distention, abdominal pain, blood in stool, constipation, diarrhea, nausea and vomiting.  Endocrine: Negative.   Genitourinary: Negative.  Negative for difficulty urinating, dysuria, frequency and hematuria.   Musculoskeletal: Negative.  Negative for arthralgias, back pain, flank pain, gait problem and myalgias.  Skin: Negative.   Neurological: Negative.  Negative for dizziness, extremity weakness, gait problem, headaches, light-headedness, numbness, seizures and speech difficulty.  Hematological: Negative.   Psychiatric/Behavioral: Negative.  Negative for depression and sleep disturbance. The patient is not nervous/anxious.    VITALS:  There were no vitals taken for this visit.  Wt Readings from Last 3 Encounters:  06/02/21 177 lb 4.8 oz (80.4 kg)  04/28/21 171 lb 12.8 oz (77.9 kg)  03/23/21 168 lb 6.4 oz (76.4 kg)    There is no  height or weight on file to calculate BMI.  Performance status (ECOG): 1 - Symptomatic but completely ambulatory  PHYSICAL EXAM:  Physical Exam Constitutional:      General: She is not in acute distress.    Appearance: Normal appearance. She is normal weight.  HENT:     Head: Normocephalic and atraumatic.  Eyes:     General: No scleral icterus.    Extraocular Movements: Extraocular movements intact.     Conjunctiva/sclera: Conjunctivae normal.     Pupils: Pupils are equal, round, and reactive to light.  Cardiovascular:     Rate and Rhythm: Normal rate and regular rhythm.     Pulses: Normal pulses.     Heart sounds: Normal heart sounds. No murmur heard.   No friction rub. No gallop.  Pulmonary:     Effort: Pulmonary effort is normal. No respiratory distress.     Breath sounds: Normal breath sounds.  Abdominal:     General: Bowel sounds are normal. There is no distension.     Palpations: Abdomen is soft. There is no hepatomegaly, splenomegaly or mass.     Tenderness: There is no abdominal tenderness.  Musculoskeletal:        General: Normal range of motion.     Cervical back: Normal range of motion and neck supple.     Right lower leg: No edema.     Left lower leg: No edema.  Lymphadenopathy:     Cervical: No cervical adenopathy.  Skin:    General: Skin is warm and dry.  Neurological:     General: No focal deficit present.     Mental Status: She is alert and oriented to person, place, and time. Mental status is at baseline.  Psychiatric:        Mood and Affect: Mood normal.        Behavior: Behavior normal.        Thought Content: Thought content normal.        Judgment: Judgment normal.   LABS:   CBC Latest Ref Rng & Units 06/02/2021 04/28/2021 03/23/2021  WBC - 6.5 3.9 6.2  Hemoglobin 12.0 - 16.0 11.0(A) 11.0(A) 11.9(A)  Hematocrit 36 - 46 32(A) 33(A) 36  Platelets 150 - 399 131(A) 110(A) 103(A)   CMP Latest Ref Rng & Units 06/02/2021 04/28/2021 03/23/2021  Glucose 70  - 99 mg/dL - - -  BUN 4 - 21 18 10 8   Creatinine 0.5 - 1.1 0.8 0.7 0.6  Sodium 137 - 147 138 138 141  Potassium 3.4 - 5.3 4.0 3.6 4.1  Chloride 99 - 108  105 106 109(A)  CO2 13 - 22 26(A) 25(A) 22  Calcium 8.7 - 10.7 9.6 9.3 9.3  Total Protein 6.0 - 8.3 g/dL - - -  Total Bilirubin 0.3 - 1.2 mg/dL - - -  Alkaline Phos 25 - 125 90 - 77  AST 13 - 35 35 - 38(A)  ALT 7 - 35 24 - 20    Lab Results  Component Value Date   TIBC 359 03/23/2021   TIBC 290 03/12/2021   TIBC 494 (H) 02/16/2021   FERRITIN 118 03/23/2021   FERRITIN 6 (L) 02/16/2021   FERRITIN 21 01/12/2021   IRONPCTSAT 16 03/23/2021   IRONPCTSAT 31 03/12/2021   IRONPCTSAT 9 (L) 02/16/2021    STUDIES:  No results found.    HISTORY:   Allergies: No Known Allergies  Current Medications: Current Outpatient Medications  Medication Sig Dispense Refill   albuterol (VENTOLIN HFA) 108 (90 Base) MCG/ACT inhaler Inhale into the lungs.     colesevelam (WELCHOL) 625 MG tablet Take 1,875 mg by mouth 2 (two) times daily.     furosemide (LASIX) 40 MG tablet Take 1 tablet by mouth daily.     insulin aspart (NOVOLOG) 100 UNIT/ML FlexPen Inject 30 Units into the skin 2 (two) times daily.     lactulose (CHRONULAC) 10 GM/15ML solution Take 20 g by mouth 1 day or 1 dose.  5   LEVEMIR FLEXTOUCH 100 UNIT/ML Pen Inject 40 Units into the skin 2 (two) times daily.  12   levofloxacin (LEVAQUIN) 750 MG tablet Take 750 mg by mouth daily.     levothyroxine (SYNTHROID) 50 MCG tablet Take 50 mcg by mouth daily.     liraglutide (VICTOZA) 18 MG/3ML SOPN Inject 1.8 mg into the skin daily.      lisinopril (PRINIVIL,ZESTRIL) 40 MG tablet Take 1 tablet by mouth daily.  3   metFORMIN (GLUCOPHAGE-XR) 500 MG 24 hr tablet Take 500 mg by mouth 2 (two) times daily with a meal.  5   montelukast (SINGULAIR) 10 MG tablet Take 10 mg by mouth at bedtime.     nystatin (MYCOSTATIN/NYSTOP) powder      omeprazole (PRILOSEC) 20 MG capsule Take 1 capsule by mouth  daily.  3   ondansetron (ZOFRAN) 4 MG tablet Take 1 tablet (4 mg total) by mouth every 4 (four) hours as needed for nausea. 90 tablet 3   oxyCODONE-acetaminophen (PERCOCET/ROXICET) 5-325 MG tablet Take 1 tablet by mouth every 4 (four) hours.     pravastatin (PRAVACHOL) 20 MG tablet Take 1 tablet by mouth once a week.     predniSONE (DELTASONE) 10 MG tablet Take by mouth.     pregabalin (LYRICA) 75 MG capsule Take 75 mg by mouth at bedtime.  1   promethazine-codeine (PHENERGAN WITH CODEINE) 6.25-10 MG/5ML syrup Take 5 mLs by mouth every 6 (six) hours as needed.     rOPINIRole (REQUIP) 2 MG tablet Take 1 tablet by mouth at bedtime.  3   spironolactone (ALDACTONE) 50 MG tablet Take 1 tablet (50 mg total) by mouth daily. 90 tablet 1   valACYclovir (VALTREX) 1000 MG tablet Take 1,000 mg by mouth 2 (two) times daily.     Vitamin D, Ergocalciferol, (DRISDOL) 1.25 MG (50000 UT) CAPS capsule Take 1 capsule by mouth once a week.     No current facility-administered medications for this visit.     ASSESSMENT & PLAN:   Assessment:  1. Iron deficiency anemia, felt to be secondary to chronic GI blood  loss from AVM's, as well as intermittent epistaxis. She has required regular IV iron replacement and transfusions.  She saw Dr. Gershon Cull at [facility] and he did cauterize her AVM's.  It appears she may have finally stabilized and stopped bleeding. She has not required transfusions or IV iron since the summer.   2. Non-alcoholic liver cirrhosis, with intermittent mild thrombocytopenia.  Her anemia is also likely partly related to the cirrhosis.   3. Ascites, for which she is on spironolactone and furosemide. Her ascites has worsened and so she will increase the spironolactone to 50 mg BID.   4. Hepatic encephalopathy.  She had a slight increase in her serum ammonia due to partial noncompliance. She now continues recommended dosing of lactulose to avoid her ammonia level going up again and causing her symptoms. Her  serum ammonia is normal today.  5. Multiple AVM's of the small bowel seen on capsule endoscopy in [place], treated at [facility] in February 2021.   She saw Dr. Gershon Cull in June and two small AVMs were treated with argon plasma coagulation.  So far her hemoglobin has been stable and she has not required further iron or transfusions.  6.  Thrombocytopenia, improved.  We will continue to monitor.  7. Severe hyperglycemia with a blood sugar of 461. She has been instructed to go home and take medication and may need to see Dr. Delena Bali sooner.   Plan:     In view of her severe fatigue and abdominal distention, I advised that she increase the spironolactone to 50 mg BID and take her insulin as soon as she returns home. Thankfully, her hemoglobin remains stable and the serum ammonia is in a normal range. She knows to continue furosemide 40 mg twice daily, as well as lactulose twice daily. We will see her back in December as scheduled with a CBC, comprehensive metabolic panel and serum ammonia for repeat clinical assessment. The patient understands the plans discussed today and is in agreement with them. She knows to contact us if she develops symptoms of worsening anemia prior to that visit.  I provided 15 minutes of face-to-face time during this this encounter and > 50% was spent counseling as documented under my assessment and plan.    I, Rita Ohara, am acting as scribe for Derwood Kaplan, MD  I have reviewed this report as typed by the medical scribe, and it is complete and accurate.

## 2021-06-24 NOTE — Progress Notes (Signed)
[name]  [address] [name],  [name]  [address] [phone]  Clinic Day:  07/01/2021  Referring physician: Nicoletta Dress, MD  This document serves as a record of services personally performed by Hosie Poisson, MD. It was created on their behalf by [facility], a trained medical scribe. The creation of this record is based on the scribe's personal observations and the provider's statements to them.  CHIEF COMPLAINT:  CC:  Iron deficiency anemia  Current Treatment:   IV iron as needed  HISTORY OF PRESENT ILLNESS:  [name] is a 70 y.o. female  who we began seeing in December 2019 for iron deficiency anemia. In November 2018, colonoscopy revealed tubular adenomas.  Upper endoscopy in January 2018 did not reveal any specific findings.  She was clearly iron deficient.  She has multiple comorbidities, including diabetes, liver cirrhosis, COPD, hyperlipidemia, and degenerative disc disease.  Testing for any other deficiency or monoclonal spike was negative. She had already been on oral supplement for nearly 1 year with lack of response, so was given IV iron in the form of Feraheme on several occasions. Repeat colonoscopy and EGD were done in August 2020 by Dr. Noberto Retort.  Biopsy was negative.  When we saw her later in September, the hemoglobin had dropped down to 7.5.  She was transfused with 2 units of packed red blood cells.  Repeat iron studies once again revealed iron deficiency, so was given IV Feraheme.  CT abdomen and pelvis was negative.  She felt so much better after the transfusion that she jumped out of bed and fractured her right foot in a fall.  She was given IV Feraheme again in November.  Capsule endoscopy in November 2020 in [facility] revealed multiple AVM's of the small bowel.  She had cauterization of an AVM of the small bowel at [facility] in February 2021. In March 2021, her hemoglobin dropped back down 7.6.  She  therefore received IV Feraheme again in early April.  Abdominal ultrasound in May revealed cirrhosis of the liver with nodular surface, increased echogenicity and heterogeneity.  No focal liver parenchymal lesion was observed.  She has had intermittent mild thrombocytopenia, presumably from her liver cirrhosis. She received IV Feraheme in June 2021, and received 2 units of packed red blood cells at that time. She was admitted to [facility] later in June with generalized weakness, altered mental status, and UTI.  While out of town, she developed severe epistaxis that required packing and 1 unit of packed red blood cells.  CT imaging revealed small amount of blood in the right maxillary and sphenoid sinuses with tube placed in the right paranasal sinus.     She developed a severe epistaxis requiring hospital admission in late July 2021 and had a nasal bullet in place.  Her hemoglobin dropped down to 6.9, she was transfused, and given a dose of IV Feraheme while in the hospital.  She had an episode of hypotension and fever and was felt to be septic, so was transferred to the ICU.  She was placed on broad-spectrum IV antibiotics, and cultures remained negative.  Echocardiogram showed a suspicious mass in the right atrium, which may be attached to the interatrial septum.  The cardiologist reviewed the echocardiogram and felt that this was likely an artifact and not a mass, vegetation or thrombus. Her hemoglobin was 8.7 at the time of discharge, and she  was seen in August with weakness of her hands,shakiness,  dropping objects, fatigue, dyspnea with exertion and moderate abdominal swelling.  She had asterixis, and ammonia level was elevated at 62.  She was therefore placed on lactulose 30 mL twice a day. She was seen a week later with some improvement, but her serum ammonia was still 54, so the lactulose was increased to TID.  She also had hyperkalemia, so her spironolactone 25 mg was held.  She was transfused with 2  units of PRBCs again at the end of September when her hemoglobin dropped down to 7.3.  Iron level was 13.0 with a TIBC of 415 for a saturation of 3.1%. She was scheduled for 2 more doses of IV Feraheme.  She was admitted in October  with a fever of 105 and had E coli sepsis. Lactulose was placed on hold due to severe diarrhea, adding to her dehydration. Her serum ammonia came down to 9, so we did not resume lactulose.  She did have a new splenic infarction as of this admission, which was causing a lot of left abdominal pain.  Her spironolactone was increased to 50 mg to take 1 to 2 daily.  At her visit in November, she was placed back on lactulose daily.  When she was seen in early February 2022, her CBC reveals a hemoglobin of 7.6 with normal ANC and platelets.  She was therefore transfused 2 units of PRBCs. Iron studies revealed recurrent iron deficiency.. Ammonia level was elevated at 35,  so her lactulose was increased to 30 cc twice daily.  She continued to have blood loss with black stools and one episode of moderate epistaxis,  She saw the gastroenterologist at [facility] on May 27th, Dr. Birdie Hopes, and had an EGD with plasma coagulation to cauterize two AVM's.  He has now recommended observation and will see her back if her hemoglobin drops again, and we document iron deficiency.  She last received IV iron in July/August 2022. She was seen last month, November 2022, for worsening symptoms and was found to have increased ascites. I recommended increase of spironolactone to 50 mg BID. Her hemoglobin was stable and serum ammonia was normal.  INTERVAL HISTORY:  [name] is here for routine follow up and states that she had a tooth cut out yesterday as she broke it the day prior. She has missed her medications for about 2 days and has had some increase in abdominal swelling. Hemoglobin is 10.7 today, previously 11.0, platelets are mildly low at 120,000, and white count is normal. Chemistries are unremarkable,  including a serum ammonia <9.0. Her  appetite is good, and she has lost 10 pounds since her last visit.  She denies fever, chills or other signs of infection.  She denies nausea, vomiting, bowel issues, or abdominal pain.  She denies sore throat, cough, dyspnea, or chest pain.  REVIEW OF SYSTEMS:  Review of Systems  Constitutional:  Positive for fatigue (stable). Negative for appetite change, chills, fever and unexpected weight change.  HENT:  Negative.  Negative for nosebleeds.   Eyes: Negative.   Respiratory: Negative.  Negative for chest tightness, cough, hemoptysis, shortness of breath and wheezing.   Cardiovascular: Negative.  Negative for chest pain, leg swelling and palpitations.  Gastrointestinal:  Positive for abdominal distention. Negative for abdominal pain, blood in stool, constipation, diarrhea, nausea and vomiting.  Endocrine: Negative.   Genitourinary: Negative.  Negative for difficulty urinating, dysuria, frequency and hematuria.   Musculoskeletal: Negative.  Negative for arthralgias, back pain, flank pain, gait problem and myalgias.  Skin:  Negative.   Neurological: Negative.  Negative for dizziness, extremity weakness, gait problem, headaches, light-headedness, numbness, seizures and speech difficulty.  Hematological: Negative.   Psychiatric/Behavioral: Negative.  Negative for depression and sleep disturbance. The patient is not nervous/anxious.    VITALS:  Blood pressure (!) 187/77, pulse 75, temperature 97.7 F (36.5 C), temperature source Oral, resp. rate 18, height 5\' 3"  (1.6 m), weight 168 lb 14.4 oz (76.6 kg), SpO2 97 %.  Wt Readings from Last 3 Encounters:  07/01/21 168 lb 14.4 oz (76.6 kg)  06/02/21 177 lb 4.8 oz (80.4 kg)  04/28/21 171 lb 12.8 oz (77.9 kg)    Body mass index is 29.92 kg/m.  Performance status (ECOG): 1 - Symptomatic but completely ambulatory  PHYSICAL EXAM:  Physical Exam Constitutional:      General: She is not in acute distress.     Appearance: Normal appearance. She is normal weight.  HENT:     Head: Normocephalic and atraumatic.  Eyes:     General: No scleral icterus.    Extraocular Movements: Extraocular movements intact.     Conjunctiva/sclera: Conjunctivae normal.     Pupils: Pupils are equal, round, and reactive to light.  Cardiovascular:     Rate and Rhythm: Normal rate and regular rhythm.     Pulses: Normal pulses.     Heart sounds: Normal heart sounds. No murmur heard.   No friction rub. No gallop.  Pulmonary:     Effort: Pulmonary effort is normal. No respiratory distress.     Breath sounds: Normal breath sounds.  Abdominal:     General: Bowel sounds are normal. There is distension (with ascites, but not tense).     Palpations: Abdomen is soft. There is hepatomegaly (mild) and splenomegaly (moderate). There is no mass.     Tenderness: There is no abdominal tenderness.  Musculoskeletal:        General: Normal range of motion.     Cervical back: Normal range of motion and neck supple.     Right lower leg: No edema.     Left lower leg: No edema.  Lymphadenopathy:     Cervical: No cervical adenopathy.  Skin:    General: Skin is warm and dry.  Neurological:     General: No focal deficit present.     Mental Status: She is alert and oriented to person, place, and time. Mental status is at baseline.  Psychiatric:        Mood and Affect: Mood normal.        Behavior: Behavior normal.        Thought Content: Thought content normal.        Judgment: Judgment normal.   LABS:   CBC Latest Ref Rng & Units 07/01/2021 06/02/2021 04/28/2021  WBC - 5.5 6.5 3.9  Hemoglobin 12.0 - 16.0 10.7(A) 11.0(A) 11.0(A)  Hematocrit 36 - 46 31(A) 32(A) 33(A)  Platelets 150 - 399 120(A) 131(A) 110(A)   CMP Latest Ref Rng & Units 07/01/2021 06/02/2021 04/28/2021  Glucose 70 - 99 mg/dL - - -  BUN 4 - 21 19 18 10   Creatinine 0.5 - 1.1 0.8 0.8 0.7  Sodium 137 - 147 139 138 138  Potassium 3.4 - 5.3 4.1 4.0 3.6  Chloride 99 -  108 108 105 106  CO2 13 - 22 26(A) 26(A) 25(A)  Calcium 8.7 - 10.7 9.4 9.6 9.3  Total Protein 6.0 - 8.3 g/dL - - -  Total Bilirubin 0.3 - 1.2 mg/dL - - -  Alkaline Phos 25 - 125 66 90 -  AST 13 - 35 31 35 -  ALT 7 - 35 29 24 -    Lab Results  Component Value Date   TIBC 359 03/23/2021   TIBC 290 03/12/2021   TIBC 494 (H) 02/16/2021   FERRITIN 118 03/23/2021   FERRITIN 6 (L) 02/16/2021   FERRITIN 21 01/12/2021   IRONPCTSAT 16 03/23/2021   IRONPCTSAT 31 03/12/2021   IRONPCTSAT 9 (L) 02/16/2021    STUDIES:  No results found.    HISTORY:   Allergies: No Known Allergies  Current Medications: Current Outpatient Medications  Medication Sig Dispense Refill   albuterol (VENTOLIN HFA) 108 (90 Base) MCG/ACT inhaler Inhale into the lungs.     amoxicillin (AMOXIL) 500 MG capsule Take 500 mg by mouth every 8 (eight) hours.     colesevelam (WELCHOL) 625 MG tablet Take 1,875 mg by mouth 2 (two) times daily.     furosemide (LASIX) 40 MG tablet Take 1 tablet by mouth daily.     insulin aspart (NOVOLOG) 100 UNIT/ML FlexPen Inject 30 Units into the skin 2 (two) times daily.     lactulose (CHRONULAC) 10 GM/15ML solution Take 20 g by mouth 1 day or 1 dose.  5   LEVEMIR FLEXTOUCH 100 UNIT/ML Pen Inject 40 Units into the skin 2 (two) times daily.  12   levofloxacin (LEVAQUIN) 750 MG tablet Take 750 mg by mouth daily.     levothyroxine (SYNTHROID) 50 MCG tablet Take 50 mcg by mouth daily.     liraglutide (VICTOZA) 18 MG/3ML SOPN Inject 1.8 mg into the skin daily.      lisinopril (PRINIVIL,ZESTRIL) 40 MG tablet Take 1 tablet by mouth daily.  3   metFORMIN (GLUCOPHAGE-XR) 500 MG 24 hr tablet Take 500 mg by mouth 2 (two) times daily with a meal.  5   montelukast (SINGULAIR) 10 MG tablet Take 10 mg by mouth at bedtime.     nystatin (MYCOSTATIN/NYSTOP) powder      omeprazole (PRILOSEC) 20 MG capsule Take 1 capsule by mouth daily.  3   ondansetron (ZOFRAN) 4 MG tablet Take 1 tablet (4 mg total) by  mouth every 4 (four) hours as needed for nausea. 90 tablet 3   oxyCODONE-acetaminophen (PERCOCET/ROXICET) 5-325 MG tablet Take 1 tablet by mouth every 4 (four) hours.     pravastatin (PRAVACHOL) 20 MG tablet Take 1 tablet by mouth once a week.     predniSONE (DELTASONE) 10 MG tablet Take by mouth.     pregabalin (LYRICA) 75 MG capsule Take 75 mg by mouth at bedtime.  1   promethazine-codeine (PHENERGAN WITH CODEINE) 6.25-10 MG/5ML syrup Take 5 mLs by mouth every 6 (six) hours as needed.     rOPINIRole (REQUIP) 2 MG tablet Take 1 tablet by mouth at bedtime.  3   spironolactone (ALDACTONE) 50 MG tablet Take 1 tablet (50 mg total) by mouth daily. 90 tablet 1   valACYclovir (VALTREX) 1000 MG tablet Take 1,000 mg by mouth 2 (two) times daily.     Vitamin D, Ergocalciferol, (DRISDOL) 1.25 MG (50000 UT) CAPS capsule Take 1 capsule by mouth once a week.     No current facility-administered medications for this visit.     ASSESSMENT & PLAN:   Assessment:  1. Iron deficiency anemia, felt to be secondary to chronic GI blood loss from AVM's, as well as intermittent epistaxis. She has required regular IV iron replacement and transfusions.  She saw Dr. Gershon Cull  at [facility] and he did cauterize her AVM's.  It appears she may have finally stabilized and stopped bleeding. She has not required transfusions or IV iron since the summer.   2. Non-alcoholic liver cirrhosis, with intermittent mild thrombocytopenia.  Her anemia is also likely partly related to the cirrhosis.   3. Ascites, for which she is on spironolactone and furosemide. She knows to continue spironolactone 50 mg BID.   4. Hepatic encephalopathy.  She had a slight increase in her serum ammonia due to partial noncompliance. She now continues recommended dosing of lactulose to avoid her ammonia level going up again and causing her symptoms. Her serum ammonia is normal today.  5. Multiple AVM's of the small bowel seen on capsule endoscopy in [place],  treated at [facility] in February 2021.   She saw Dr. Gershon Cull in June and two small AVMs were treated with argon plasma coagulation.  So far her hemoglobin has been stable and she has not required further iron or transfusions.  6.  Thrombocytopenia, mild.  We will continue to monitor.  Plan:     She knows to continue spironolactone 50 mg twice daily, furosemide 40 mg twice daily, as well as lactulose. We will see her back in mid January with a CBC and comprehensive metabolic panel for repeat clinical assessment. The patient understands the plans discussed today and is in agreement with them. She knows to contact us if she develops symptoms of worsening anemia prior to that visit.  I provided 15 minutes of face-to-face time during this this encounter and > 50% was spent counseling as documented under my assessment and plan.    I, Rita Ohara, am acting as scribe for Derwood Kaplan, MD  I have reviewed this report as typed by the medical scribe, and it is complete and accurate.

## 2021-06-28 ENCOUNTER — Ambulatory Visit: Payer: Medicare Other | Admitting: Oncology

## 2021-06-28 ENCOUNTER — Other Ambulatory Visit: Payer: Medicare Other

## 2021-07-01 ENCOUNTER — Telehealth: Payer: Self-pay | Admitting: Oncology

## 2021-07-01 ENCOUNTER — Encounter: Payer: Self-pay | Admitting: Oncology

## 2021-07-01 ENCOUNTER — Inpatient Hospital Stay (INDEPENDENT_AMBULATORY_CARE_PROVIDER_SITE_OTHER): Payer: Medicare Other | Admitting: Oncology

## 2021-07-01 ENCOUNTER — Inpatient Hospital Stay: Payer: Medicare Other | Attending: Oncology

## 2021-07-01 ENCOUNTER — Other Ambulatory Visit: Payer: Self-pay | Admitting: Oncology

## 2021-07-01 ENCOUNTER — Other Ambulatory Visit: Payer: Self-pay | Admitting: Hematology and Oncology

## 2021-07-01 VITALS — BP 187/77 | HR 75 | Temp 97.7°F | Resp 18 | Ht 63.0 in | Wt 168.9 lb

## 2021-07-01 DIAGNOSIS — K7460 Unspecified cirrhosis of liver: Secondary | ICD-10-CM

## 2021-07-01 DIAGNOSIS — D500 Iron deficiency anemia secondary to blood loss (chronic): Secondary | ICD-10-CM

## 2021-07-01 DIAGNOSIS — K7581 Nonalcoholic steatohepatitis (NASH): Secondary | ICD-10-CM

## 2021-07-01 DIAGNOSIS — D696 Thrombocytopenia, unspecified: Secondary | ICD-10-CM

## 2021-07-01 DIAGNOSIS — D649 Anemia, unspecified: Secondary | ICD-10-CM | POA: Diagnosis not present

## 2021-07-01 DIAGNOSIS — D509 Iron deficiency anemia, unspecified: Secondary | ICD-10-CM | POA: Diagnosis not present

## 2021-07-01 LAB — HEPATIC FUNCTION PANEL
ALT: 29 (ref 7–35)
AST: 31 (ref 13–35)
Alkaline Phosphatase: 66 (ref 25–125)
Bilirubin, Total: 0.7

## 2021-07-01 LAB — CBC: RBC: 3.19 — AB (ref 3.87–5.11)

## 2021-07-01 LAB — BASIC METABOLIC PANEL
BUN: 19 (ref 4–21)
CO2: 26 — AB (ref 13–22)
Chloride: 108 (ref 99–108)
Creatinine: 0.8 (ref 0.5–1.1)
Glucose: 130
Potassium: 4.1 (ref 3.4–5.3)
Sodium: 139 (ref 137–147)

## 2021-07-01 LAB — CBC AND DIFFERENTIAL
HCT: 31 — AB (ref 36–46)
Hemoglobin: 10.7 — AB (ref 12.0–16.0)
Neutrophils Absolute: 3.85
Platelets: 120 — AB (ref 150–399)
WBC: 5.5

## 2021-07-01 LAB — COMPREHENSIVE METABOLIC PANEL
Albumin: 3.6 (ref 3.5–5.0)
Calcium: 9.4 (ref 8.7–10.7)

## 2021-07-01 NOTE — Telephone Encounter (Signed)
Per 12/8 LOS, patient scheduled for Jan 2023 Appt's.  Gave patient Appt Summary

## 2021-07-08 ENCOUNTER — Encounter: Payer: Self-pay | Admitting: Oncology

## 2021-07-15 DIAGNOSIS — S40012A Contusion of left shoulder, initial encounter: Secondary | ICD-10-CM | POA: Diagnosis not present

## 2021-07-15 DIAGNOSIS — W19XXXA Unspecified fall, initial encounter: Secondary | ICD-10-CM | POA: Diagnosis not present

## 2021-07-15 DIAGNOSIS — E1165 Type 2 diabetes mellitus with hyperglycemia: Secondary | ICD-10-CM | POA: Diagnosis not present

## 2021-07-22 DIAGNOSIS — I700 Atherosclerosis of aorta: Secondary | ICD-10-CM | POA: Diagnosis not present

## 2021-07-22 DIAGNOSIS — M958 Other specified acquired deformities of musculoskeletal system: Secondary | ICD-10-CM | POA: Diagnosis not present

## 2021-07-22 DIAGNOSIS — M25512 Pain in left shoulder: Secondary | ICD-10-CM | POA: Diagnosis not present

## 2021-07-22 DIAGNOSIS — M19012 Primary osteoarthritis, left shoulder: Secondary | ICD-10-CM | POA: Diagnosis not present

## 2021-07-22 DIAGNOSIS — Z981 Arthrodesis status: Secondary | ICD-10-CM | POA: Diagnosis not present

## 2021-08-05 DIAGNOSIS — H2513 Age-related nuclear cataract, bilateral: Secondary | ICD-10-CM | POA: Diagnosis not present

## 2021-08-13 NOTE — Progress Notes (Signed)
[facility]  [address] [place],  [place]  [address] [phone]  Clinic Day:  08/19/2021  Referring physician: Nicoletta Dress, MD  This document serves as a record of services personally performed by Hosie Poisson, MD. It was created on their behalf by [facility], a trained medical scribe. The creation of this record is based on the scribe's personal observations and the provider's statements to them.  CHIEF COMPLAINT:  CC:  Iron deficiency anemia  Current Treatment:   IV iron as needed  HISTORY OF PRESENT ILLNESS:  [name] is a 71 y.o. female  who we began seeing in December 2019 for iron deficiency anemia. In November 2018, colonoscopy revealed tubular adenomas.  Upper endoscopy in January 2018 did not reveal any specific findings.  She was clearly iron deficient.  She has multiple comorbidities, including diabetes, liver cirrhosis, COPD, hyperlipidemia, and degenerative disc disease.  Testing for any other deficiency or monoclonal spike was negative. She had already been on oral supplement for nearly 1 year with lack of response, so was given IV iron in the form of Feraheme on several occasions. Repeat colonoscopy and EGD were done in August 2020 by Dr. Noberto Retort.  Biopsy was negative.  When we saw her later in September, the hemoglobin had dropped down to 7.5.  She was transfused with 2 units of packed red blood cells.  Repeat iron studies once again revealed iron deficiency, so was given IV Feraheme.  CT abdomen and pelvis was negative.  She felt so much better after the transfusion that she jumped out of bed and fractured her right foot in a fall.  She was given IV Feraheme again in November.  Capsule endoscopy in November 2020 in [facility] revealed multiple AVM's of the small bowel.  She had cauterization of an AVM of the small bowel at [facility] in February 2021. In March 2021, her hemoglobin dropped back down 7.6.  She  therefore received IV Feraheme again in early April.  Abdominal ultrasound in May revealed cirrhosis of the liver with nodular surface, increased echogenicity and heterogeneity.  No focal liver parenchymal lesion was observed.  She has had intermittent mild thrombocytopenia, presumably from her liver cirrhosis. She received IV Feraheme in June 2021, and received 2 units of packed red blood cells at that time. She was admitted to [facility] later in June with generalized weakness, altered mental status, and UTI.  While out of town, she developed severe epistaxis that required packing and 1 unit of packed red blood cells.  CT imaging revealed small amount of blood in the right maxillary and sphenoid sinuses with tube placed in the right paranasal sinus.     She developed a severe epistaxis requiring hospital admission in late July 2021 and had a nasal bullet in place.  Her hemoglobin dropped down to 6.9, she was transfused, and given a dose of IV Feraheme while in the hospital.  She had an episode of hypotension and fever and was felt to be septic, so was transferred to the ICU.  She was placed on broad-spectrum IV antibiotics, and cultures remained negative.  Echocardiogram showed a suspicious mass in the right atrium, which may be attached to the interatrial septum.  The cardiologist reviewed the echocardiogram and felt that this was likely an artifact and not a mass, vegetation or thrombus. Her hemoglobin was 8.7 at the time of discharge, and she  was seen in August with weakness of her hands,shakiness,  dropping objects, fatigue, dyspnea with exertion and moderate abdominal swelling.  She had asterixis, and ammonia level was elevated at 62.  She was therefore placed on lactulose 30 mL twice a day. She was seen a week later with some improvement, but her serum ammonia was still 54, so the lactulose was increased to TID.  She also had hyperkalemia, so her spironolactone 25 mg was held.  She was transfused with 2  units of PRBCs again at the end of September when her hemoglobin dropped down to 7.3.  Iron level was 13.0 with a TIBC of 415 for a saturation of 3.1%. She was scheduled for 2 more doses of IV Feraheme.  She was admitted in October  with a fever of 105 and had E coli sepsis. Lactulose was placed on hold due to severe diarrhea, adding to her dehydration. Her serum ammonia came down to 9, so we did not resume lactulose.  She did have a new splenic infarction as of this admission, which was causing a lot of left abdominal pain.  Her spironolactone was increased to 50 mg to take 1 to 2 daily.  At her visit in November, she was placed back on lactulose daily.  When she was seen in early February 2022, her CBC reveals a hemoglobin of 7.6 with normal ANC and platelets.  She was therefore transfused 2 units of PRBCs. Iron studies revealed recurrent iron deficiency.. Ammonia level was elevated at 35,  so her lactulose was increased to 30 cc twice daily.  She continued to have blood loss with black stools and one episode of moderate epistaxis,  She saw the gastroenterologist at [facility] on May 27th, Dr. Birdie Hopes, and had an EGD with plasma coagulation to cauterize two AVM's.  He has now recommended observation and will see her back if her hemoglobin drops again, and we document iron deficiency.  She last received IV iron in July/August 2022. She was seen last month, November 2022, for worsening symptoms and was found to have increased ascites. I recommended increase of spironolactone to 50 mg BID. Her hemoglobin was stable and serum ammonia was normal.  INTERVAL HISTORY:  [name] is here for routine follow up and states that she has been well. She has been eating more red meat. She continues spironolactone 50 mg twice daily, furosemide 40 mg twice daily, as well as lactulose. She states that she had some falls, one she associates with hypoglycemia as her blood glucose was in the 30s, and the other she attributes to the  shoes she wore. She does report some mild left shoulder pain from her falls, which she rates as a 4/10 today. X-rays were negative for fracture. Hemoglobin is up to 11.5, previously 10.7, and white count and platelets are normal. Chemistries are unremarkable. Blood glucose is 187, and she states that she ate breakfast and hasn't taken her medication today. Her  appetite is good, and she has lost 6 pounds since her last visit. She has been making healthier diet choices and exercising and has decrease in her abdominal swelling. She denies fever, chills or other signs of infection.  She denies nausea, vomiting, bowel issues, or abdominal pain.  She denies sore throat, cough, dyspnea, or chest pain.  REVIEW OF SYSTEMS:  Review of Systems  Constitutional: Negative.  Negative for appetite change, chills, fatigue, fever and unexpected weight change.  HENT:  Negative.    Eyes: Negative.   Respiratory: Negative.  Negative for chest tightness, cough, hemoptysis, shortness of breath  and wheezing.   Cardiovascular: Negative.  Negative for chest pain, leg swelling and palpitations.  Gastrointestinal: Negative.  Negative for abdominal distention, abdominal pain, blood in stool, constipation, diarrhea, nausea and vomiting.  Endocrine: Negative.   Genitourinary: Negative.  Negative for difficulty urinating, dysuria, frequency and hematuria.   Musculoskeletal:  Negative for arthralgias, back pain, flank pain, gait problem and myalgias.       Left shoulder pain from recent fall, mild  Skin: Negative.   Neurological: Negative.  Negative for dizziness, extremity weakness, gait problem, headaches, light-headedness, numbness, seizures and speech difficulty.  Hematological: Negative.   Psychiatric/Behavioral: Negative.  Negative for depression and sleep disturbance. The patient is not nervous/anxious.    VITALS:  Blood pressure (!) 168/74, pulse 75, temperature 97.9 F (36.6 C), temperature source Oral, resp. rate 18,  height 5\' 3"  (1.6 m), weight 162 lb 6.4 oz (73.7 kg), SpO2 98 %.  Wt Readings from Last 3 Encounters:  08/19/21 162 lb 6.4 oz (73.7 kg)  07/01/21 168 lb 14.4 oz (76.6 kg)  06/02/21 177 lb 4.8 oz (80.4 kg)    Body mass index is 28.77 kg/m.  Performance status (ECOG): 1 - Symptomatic but completely ambulatory  PHYSICAL EXAM:  Physical Exam Constitutional:      General: She is not in acute distress.    Appearance: Normal appearance. She is normal weight.  HENT:     Head: Normocephalic and atraumatic.  Eyes:     General: No scleral icterus.    Extraocular Movements: Extraocular movements intact.     Conjunctiva/sclera: Conjunctivae normal.     Pupils: Pupils are equal, round, and reactive to light.  Cardiovascular:     Rate and Rhythm: Normal rate and regular rhythm.     Pulses: Normal pulses.     Heart sounds: Murmur heard.  Systolic murmur is present with a grade of 2/6.    No friction rub. No gallop.  Pulmonary:     Effort: Pulmonary effort is normal. No respiratory distress.     Breath sounds: Normal breath sounds.  Abdominal:     General: Bowel sounds are normal. There is no distension.     Palpations: Abdomen is soft. There is no hepatomegaly, splenomegaly or mass.     Tenderness: There is no abdominal tenderness.     Comments: Mild ascites  Musculoskeletal:        General: Normal range of motion.     Cervical back: Normal range of motion and neck supple.     Right lower leg: No edema.     Left lower leg: No edema.  Lymphadenopathy:     Cervical: No cervical adenopathy.  Skin:    General: Skin is warm and dry.  Neurological:     General: No focal deficit present.     Mental Status: She is alert and oriented to person, place, and time. Mental status is at baseline.  Psychiatric:        Mood and Affect: Mood normal.        Behavior: Behavior normal.        Thought Content: Thought content normal.        Judgment: Judgment normal.   LABS:   CBC Latest Ref Rng &  Units 08/19/2021 07/01/2021 06/02/2021  WBC - 6.3 5.5 6.5  Hemoglobin 12.0 - 16.0 11.5(A) 10.7(A) 11.0(A)  Hematocrit 36 - 46 35(A) 31(A) 32(A)  Platelets 150 - 399 146(A) 120(A) 131(A)   CMP Latest Ref Rng & Units 08/19/2021 07/01/2021 06/02/2021  Glucose 70 - 99 mg/dL - - -  BUN 4 - 21 13 19 18   Creatinine 0.5 - 1.1 0.7 0.8 0.8  Sodium 137 - 147 139 139 138  Potassium 3.4 - 5.3 3.8 4.1 4.0  Chloride 99 - 108 107 108 105  CO2 13 - 22 25(A) 26(A) 26(A)  Calcium 8.7 - 10.7 9.6 9.4 9.6  Total Protein 6.0 - 8.3 g/dL - - -  Total Bilirubin 0.3 - 1.2 mg/dL - - -  Alkaline Phos 25 - 125 72 66 90  AST 13 - 35 35 31 35  ALT 7 - 35 22 29 24     Lab Results  Component Value Date   TIBC 359 03/23/2021   TIBC 290 03/12/2021   TIBC 494 (H) 02/16/2021   FERRITIN 118 03/23/2021   FERRITIN 6 (L) 02/16/2021   FERRITIN 21 01/12/2021   IRONPCTSAT 16 03/23/2021   IRONPCTSAT 31 03/12/2021   IRONPCTSAT 9 (L) 02/16/2021    STUDIES:  No results found.    HISTORY:   Allergies: No Known Allergies  Current Medications: Current Outpatient Medications  Medication Sig Dispense Refill   albuterol (VENTOLIN HFA) 108 (90 Base) MCG/ACT inhaler Inhale into the lungs.     amoxicillin (AMOXIL) 500 MG capsule Take 500 mg by mouth every 8 (eight) hours.     colesevelam (WELCHOL) 625 MG tablet Take 1,875 mg by mouth 2 (two) times daily.     furosemide (LASIX) 40 MG tablet Take 1 tablet by mouth daily.     insulin aspart (NOVOLOG) 100 UNIT/ML FlexPen Inject 30 Units into the skin 2 (two) times daily.     lactulose (CHRONULAC) 10 GM/15ML solution Take 20 g by mouth 1 day or 1 dose.  5   LEVEMIR FLEXTOUCH 100 UNIT/ML Pen Inject 40 Units into the skin 2 (two) times daily.  12   levofloxacin (LEVAQUIN) 750 MG tablet Take 750 mg by mouth daily.     levothyroxine (SYNTHROID) 50 MCG tablet Take 50 mcg by mouth daily.     liraglutide (VICTOZA) 18 MG/3ML SOPN Inject 1.8 mg into the skin daily.      lisinopril  (PRINIVIL,ZESTRIL) 40 MG tablet Take 1 tablet by mouth daily.  3   metFORMIN (GLUCOPHAGE-XR) 500 MG 24 hr tablet Take 500 mg by mouth 2 (two) times daily with a meal.  5   montelukast (SINGULAIR) 10 MG tablet Take 10 mg by mouth at bedtime.     nystatin (MYCOSTATIN/NYSTOP) powder      omeprazole (PRILOSEC) 20 MG capsule Take 1 capsule by mouth daily.  3   ondansetron (ZOFRAN) 4 MG tablet Take 1 tablet (4 mg total) by mouth every 4 (four) hours as needed for nausea. 90 tablet 3   oxyCODONE-acetaminophen (PERCOCET/ROXICET) 5-325 MG tablet Take 1 tablet by mouth every 4 (four) hours.     pravastatin (PRAVACHOL) 20 MG tablet Take 1 tablet by mouth once a week.     predniSONE (DELTASONE) 10 MG tablet Take by mouth.     pregabalin (LYRICA) 75 MG capsule Take 75 mg by mouth at bedtime.  1   promethazine-codeine (PHENERGAN WITH CODEINE) 6.25-10 MG/5ML syrup Take 5 mLs by mouth every 6 (six) hours as needed.     rOPINIRole (REQUIP) 2 MG tablet Take 1 tablet by mouth at bedtime.  3   spironolactone (ALDACTONE) 50 MG tablet Take 1 tablet (50 mg total) by mouth daily. 90 tablet 1   valACYclovir (VALTREX) 1000 MG tablet Take 1,000  mg by mouth 2 (two) times daily.     Vitamin D, Ergocalciferol, (DRISDOL) 1.25 MG (50000 UT) CAPS capsule Take 1 capsule by mouth once a week.     No current facility-administered medications for this visit.     ASSESSMENT & PLAN:   Assessment:  1. Iron deficiency anemia, felt to be secondary to chronic GI blood loss from AVM's, as well as intermittent epistaxis. She has required regular IV iron replacement and transfusions.  She saw Dr. Gershon Cull at [facility] and he did cauterize her AVM's.  It appears she may have finally stabilized and stopped bleeding. She has not required transfusions or IV iron since the summer 2022.   2. Non-alcoholic liver cirrhosis, with intermittent mild thrombocytopenia.  Her anemia is also likely partly related to the cirrhosis.   3. Ascites, improved,  for which she is on spironolactone and furosemide. She knows to continue spironolactone 50 mg BID.   4. Hepatic encephalopathy.  She had a slight increase in her serum ammonia due to partial noncompliance. She now continues recommended dosing of lactulose.   5. Multiple AVM's of the small bowel seen on capsule endoscopy in [place], treated at [facility] in February 2021.   She saw Dr. Gershon Cull in June and two small AVMs were treated with argon plasma coagulation.  So far her hemoglobin has been stable and she has not required further iron or transfusions.  6.  Thrombocytopenia, mild, now resolved.  We will continue to monitor.  Plan:     She knows to continue spironolactone 50 mg twice daily, furosemide 40 mg twice daily, as well as lactulose. She will see Dr. Delena Bali in February. We will see her back in 2 months with a CBC and comprehensive metabolic panel for repeat clinical assessment. If she remains stable we will continue to spread out her appointments. The patient understands the plans discussed today and is in agreement with them. She knows to contact us if she develops symptoms of worsening anemia prior to that visit.  I provided 15 minutes of face-to-face time during this this encounter and > 50% was spent counseling as documented under my assessment and plan.    I, Rita Ohara, am acting as scribe for Derwood Kaplan, MD  I have reviewed this report as typed by the medical scribe, and it is complete and accurate.

## 2021-08-19 ENCOUNTER — Telehealth: Payer: Self-pay | Admitting: Oncology

## 2021-08-19 ENCOUNTER — Other Ambulatory Visit: Payer: Self-pay | Admitting: Hematology and Oncology

## 2021-08-19 ENCOUNTER — Other Ambulatory Visit: Payer: Self-pay | Admitting: Oncology

## 2021-08-19 ENCOUNTER — Encounter: Payer: Self-pay | Admitting: Oncology

## 2021-08-19 ENCOUNTER — Inpatient Hospital Stay: Payer: Medicare Other | Attending: Oncology

## 2021-08-19 ENCOUNTER — Other Ambulatory Visit: Payer: Self-pay

## 2021-08-19 ENCOUNTER — Inpatient Hospital Stay (INDEPENDENT_AMBULATORY_CARE_PROVIDER_SITE_OTHER): Payer: Medicare Other | Admitting: Oncology

## 2021-08-19 VITALS — BP 168/74 | HR 75 | Temp 97.9°F | Resp 18 | Ht 63.0 in | Wt 162.4 lb

## 2021-08-19 DIAGNOSIS — D500 Iron deficiency anemia secondary to blood loss (chronic): Secondary | ICD-10-CM

## 2021-08-19 DIAGNOSIS — K7460 Unspecified cirrhosis of liver: Secondary | ICD-10-CM | POA: Diagnosis not present

## 2021-08-19 DIAGNOSIS — D649 Anemia, unspecified: Secondary | ICD-10-CM | POA: Diagnosis not present

## 2021-08-19 DIAGNOSIS — K7581 Nonalcoholic steatohepatitis (NASH): Secondary | ICD-10-CM

## 2021-08-19 LAB — CBC: RBC: 3.53 — AB (ref 3.87–5.11)

## 2021-08-19 LAB — CBC AND DIFFERENTIAL
HCT: 35 — AB (ref 36–46)
Hemoglobin: 11.5 — AB (ref 12.0–16.0)
Neutrophils Absolute: 4.35
Platelets: 146 — AB (ref 150–399)
WBC: 6.3

## 2021-08-19 LAB — HEPATIC FUNCTION PANEL
ALT: 22 (ref 7–35)
AST: 35 (ref 13–35)
Alkaline Phosphatase: 72 (ref 25–125)
Bilirubin, Total: 1.2

## 2021-08-19 LAB — COMPREHENSIVE METABOLIC PANEL
Albumin: 4.1 (ref 3.5–5.0)
Calcium: 9.6 (ref 8.7–10.7)

## 2021-08-19 LAB — BASIC METABOLIC PANEL
BUN: 13 (ref 4–21)
CO2: 25 — AB (ref 13–22)
Chloride: 107 (ref 99–108)
Creatinine: 0.7 (ref 0.5–1.1)
Glucose: 182
Potassium: 3.8 (ref 3.4–5.3)
Sodium: 139 (ref 137–147)

## 2021-08-19 NOTE — Telephone Encounter (Signed)
Per 1/26 los next appt scheduled and confirmed with patient

## 2021-09-01 ENCOUNTER — Encounter: Payer: Self-pay | Admitting: Oncology

## 2021-09-08 DIAGNOSIS — M25561 Pain in right knee: Secondary | ICD-10-CM | POA: Diagnosis not present

## 2021-09-10 DIAGNOSIS — L578 Other skin changes due to chronic exposure to nonionizing radiation: Secondary | ICD-10-CM | POA: Diagnosis not present

## 2021-09-10 DIAGNOSIS — D225 Melanocytic nevi of trunk: Secondary | ICD-10-CM | POA: Diagnosis not present

## 2021-09-10 DIAGNOSIS — L821 Other seborrheic keratosis: Secondary | ICD-10-CM | POA: Diagnosis not present

## 2021-09-10 DIAGNOSIS — D1801 Hemangioma of skin and subcutaneous tissue: Secondary | ICD-10-CM | POA: Diagnosis not present

## 2021-09-10 DIAGNOSIS — L570 Actinic keratosis: Secondary | ICD-10-CM | POA: Diagnosis not present

## 2021-09-21 DIAGNOSIS — I509 Heart failure, unspecified: Secondary | ICD-10-CM | POA: Diagnosis not present

## 2021-09-21 DIAGNOSIS — I10 Essential (primary) hypertension: Secondary | ICD-10-CM | POA: Diagnosis not present

## 2021-09-21 DIAGNOSIS — E1129 Type 2 diabetes mellitus with other diabetic kidney complication: Secondary | ICD-10-CM | POA: Diagnosis not present

## 2021-10-17 NOTE — Progress Notes (Signed)
?[facility]  ?[address] ?[place],  [place]  [address] ?[phone] ? ?Clinic Day:  10/18/21 ? ?Referring physician: Nicoletta Dress, MD ? ?CHIEF COMPLAINT:  ?CC:  Iron deficiency anemia ? ?Current Treatment:   IV iron as needed ? ?HISTORY OF PRESENT ILLNESS:  ?[name] is a 71 y.o. female  who we began seeing in December 2019 for iron deficiency anemia. In November 2018, colonoscopy revealed tubular adenomas.  Upper endoscopy in January 2018 did not reveal any specific findings.  She was clearly iron deficient.  She has multiple comorbidities, including diabetes, liver cirrhosis, COPD, hyperlipidemia, and degenerative disc disease.  Testing for any other deficiency or monoclonal spike was negative. She had already been on oral supplement for nearly 1 year with lack of response, so was given IV iron in the form of Feraheme on several occasions. Repeat colonoscopy and EGD were done in August 2020 by Dr. Noberto Retort.  Biopsy was negative.  When we saw her later in September, the hemoglobin had dropped down to 7.5.  She was transfused with 2 units of packed red blood cells.  Repeat iron studies once again revealed iron deficiency, so was given IV Feraheme.  CT abdomen and pelvis was negative.  She felt so much better after the transfusion that she jumped out of bed and fractured her right foot in a fall.  She was given IV Feraheme again in November.  Capsule endoscopy in November 2020 in [facility] revealed multiple AVM's of the small bowel.  She had cauterization of an AVM of the small bowel at [facility] in February 2021. In March 2021, her hemoglobin dropped back down 7.6.  She therefore received IV Feraheme again in early April.  Abdominal ultrasound in May revealed cirrhosis of the liver with nodular surface, increased echogenicity and heterogeneity.  No focal liver parenchymal lesion was observed.  She has had intermittent mild thrombocytopenia, presumably from  her liver cirrhosis. She received IV Feraheme in June 2021, and received 2 units of packed red blood cells at that time. She was admitted to [facility] later in June with generalized weakness, altered mental status, and UTI.  While out of town, she developed severe epistaxis that required packing and 1 unit of packed red blood cells.  CT imaging revealed small amount of blood in the right maxillary and sphenoid sinuses with tube placed in the right paranasal sinus.   ?  ?She developed a severe epistaxis requiring hospital admission in late July 2021 and had a nasal bullet in place.  Her hemoglobin dropped down to 6.9, she was transfused, and given a dose of IV Feraheme while in the hospital.  She had an episode of hypotension and fever and was felt to be septic, so was transferred to the ICU.  She was placed on broad-spectrum IV antibiotics, and cultures remained negative.  Echocardiogram showed a suspicious mass in the right atrium, which may be attached to the interatrial septum.  The cardiologist reviewed the echocardiogram and felt that this was likely an artifact and not a mass, vegetation or thrombus. Her hemoglobin was 8.7 at the time of discharge, and she  was seen in August with weakness of her hands,shakiness, dropping objects, fatigue, dyspnea with exertion and moderate abdominal swelling.  She had asterixis, and ammonia level was elevated at 62.  She was therefore placed on lactulose 30 mL twice a day. She was seen a week later with some improvement, but her serum ammonia  was still 54, so the lactulose was increased to TID.  She also had hyperkalemia, so her spironolactone 25 mg was held.  She was transfused with 2 units of PRBCs again at the end of September when her hemoglobin dropped down to 7.3.  Iron level was 13.0 with a TIBC of 415 for a saturation of 3.1%. She was scheduled for 2 more doses of IV Feraheme.  She was admitted in October  with a fever of 105 and had E coli sepsis. Lactulose was placed  on hold due to severe diarrhea, adding to her dehydration. Her serum ammonia came down to 9, so we did not resume lactulose.  She did have a new splenic infarction as of this admission, which was causing a lot of left abdominal pain.  Her spironolactone was increased to 50 mg to take 1 to 2 daily.  At her visit in November, she was placed back on lactulose daily. ? ?When she was seen in early February 2022, her CBC reveals a hemoglobin of 7.6 with normal ANC and platelets.  She was therefore transfused 2 units of PRBCs. Iron studies revealed recurrent iron deficiency.. Ammonia level was elevated at 35,  so her lactulose was increased to 30 cc twice daily.  She continued to have blood loss with black stools and one episode of moderate epistaxis,  She saw the gastroenterologist at [facility] on May 27th, Dr. Birdie Hopes, and had an EGD with plasma coagulation to cauterize two AVM's.  He has now recommended observation and will see her back if her hemoglobin drops again, and we document iron deficiency.  She last received IV iron in July/August 2022. She was seen last month, November 2022, for worsening symptoms and was found to have increased ascites. I recommended increase of spironolactone to 50 mg BID. Her hemoglobin was stable and serum ammonia was normal. ? ?INTERVAL HISTORY:  ?[name] is here for routine follow up and states that she has not been well.  She has been on treatment with 5-FU to her face and has had severe skin reaction to this.  It is still slowly recovering and still quite tender.  Her appetite has been good but she has had difficulty eating because of the soreness of her face.  Her hemoglobin has dropped dramatically from 11.5-9.9 since the end of January.  Her white count is a little lower but still normal at 4.3, and I wonder if she has had some systemic absorption of the 5-FU.  She denies any overt bleeding or black stools.  She continues spironolactone 50 mg twice daily, furosemide 40 mg twice  daily, as well as lactulose.  Chemistries are unremarkable.  I gave her copies of her labs to take with her to her appointment with Dr. Delena Bali on Friday.  Her ascites seems under fair control. Her  appetite is good, and she has gained 3 pounds since her last visit. She has been making healthier diet choices and exercising and has decrease in her abdominal swelling. She denies fever, chills or other signs of infection.  She denies nausea, vomiting, bowel issues, or abdominal pain.  She denies sore throat, cough, dyspnea, or chest pain. ? ?REVIEW OF SYSTEMS:  ?Review of Systems  ?Constitutional: Negative.  Negative for appetite change, chills, fatigue, fever and unexpected weight change.  ?HENT:  Negative.    ?     He has severe pain of her face from recent chemotherapy treatment with 5-FU.  ?Eyes: Negative.   ?Respiratory: Negative.  Negative  for chest tightness, cough, hemoptysis, shortness of breath and wheezing.   ?Cardiovascular: Negative.  Negative for chest pain, leg swelling and palpitations.  ?Gastrointestinal: Negative.  Negative for abdominal distention, abdominal pain, blood in stool, constipation, diarrhea, nausea and vomiting.  ?Endocrine: Negative.   ?Genitourinary: Negative.  Negative for difficulty urinating, dysuria, frequency and hematuria.   ?Musculoskeletal:  Negative for arthralgias, back pain, flank pain, gait problem and myalgias.  ?     Left shoulder pain from recent fall, mild  ?Skin: Negative.   ?Neurological: Negative.  Negative for dizziness, extremity weakness, gait problem, headaches, light-headedness, numbness, seizures and speech difficulty.  ?Hematological: Negative.   ?Psychiatric/Behavioral: Negative.  Negative for depression and sleep disturbance. The patient is not nervous/anxious.   ? ?VITALS:  ?Blood pressure (!) 150/67, pulse 81, temperature 98 ?F (36.7 ?C), temperature source Oral, resp. rate 18, height '5\' 3"'$  (1.6 m), weight 165 lb 11.2 oz (75.2 kg), SpO2 99 %.  ?Wt Readings  from Last 3 Encounters:  ?10/18/21 165 lb 11.2 oz (75.2 kg)  ?08/19/21 162 lb 6.4 oz (73.7 kg)  ?07/01/21 168 lb 14.4 oz (76.6 kg)  ?  ?Body mass index is 29.35 kg/m?. ? ?Performance status (ECOG): 1 - Sy

## 2021-10-18 ENCOUNTER — Inpatient Hospital Stay: Payer: Medicare Other | Attending: Oncology | Admitting: Oncology

## 2021-10-18 ENCOUNTER — Other Ambulatory Visit: Payer: Self-pay

## 2021-10-18 ENCOUNTER — Other Ambulatory Visit: Payer: Self-pay | Admitting: Oncology

## 2021-10-18 ENCOUNTER — Inpatient Hospital Stay: Payer: Medicare Other

## 2021-10-18 ENCOUNTER — Encounter: Payer: Self-pay | Admitting: Oncology

## 2021-10-18 VITALS — BP 150/67 | HR 81 | Temp 98.0°F | Resp 18 | Ht 63.0 in | Wt 165.7 lb

## 2021-10-18 DIAGNOSIS — K7460 Unspecified cirrhosis of liver: Secondary | ICD-10-CM | POA: Diagnosis not present

## 2021-10-18 DIAGNOSIS — D696 Thrombocytopenia, unspecified: Secondary | ICD-10-CM | POA: Diagnosis not present

## 2021-10-18 DIAGNOSIS — D500 Iron deficiency anemia secondary to blood loss (chronic): Secondary | ICD-10-CM | POA: Diagnosis not present

## 2021-10-18 DIAGNOSIS — K7581 Nonalcoholic steatohepatitis (NASH): Secondary | ICD-10-CM

## 2021-10-18 DIAGNOSIS — R188 Other ascites: Secondary | ICD-10-CM

## 2021-10-18 DIAGNOSIS — D649 Anemia, unspecified: Secondary | ICD-10-CM | POA: Diagnosis not present

## 2021-10-18 LAB — BASIC METABOLIC PANEL
BUN: 10 (ref 4–21)
CO2: 24 — AB (ref 13–22)
Chloride: 109 — AB (ref 99–108)
Creatinine: 0.7 (ref 0.5–1.1)
Glucose: 152
Potassium: 3.6 mEq/L (ref 3.5–5.1)
Sodium: 141 (ref 137–147)

## 2021-10-18 LAB — CBC AND DIFFERENTIAL
HCT: 31 — AB (ref 36–46)
Hemoglobin: 9.9 — AB (ref 12.0–16.0)
Neutrophils Absolute: 2.62
Platelets: 136 10*3/uL — AB (ref 150–400)
WBC: 4.3

## 2021-10-18 LAB — COMPREHENSIVE METABOLIC PANEL
Albumin: 3.7 (ref 3.5–5.0)
Calcium: 9.2 (ref 8.7–10.7)

## 2021-10-18 LAB — HEPATIC FUNCTION PANEL
ALT: 22 U/L (ref 7–35)
AST: 32 (ref 13–35)
Alkaline Phosphatase: 63 (ref 25–125)
Bilirubin, Total: 0.8

## 2021-10-18 LAB — CBC: RBC: 3.22 — AB (ref 3.87–5.11)

## 2021-10-22 DIAGNOSIS — K7581 Nonalcoholic steatohepatitis (NASH): Secondary | ICD-10-CM | POA: Diagnosis not present

## 2021-10-22 DIAGNOSIS — I10 Essential (primary) hypertension: Secondary | ICD-10-CM | POA: Diagnosis not present

## 2021-10-22 DIAGNOSIS — K7460 Unspecified cirrhosis of liver: Secondary | ICD-10-CM | POA: Diagnosis not present

## 2021-10-22 DIAGNOSIS — M810 Age-related osteoporosis without current pathological fracture: Secondary | ICD-10-CM | POA: Diagnosis not present

## 2021-10-22 DIAGNOSIS — G729 Myopathy, unspecified: Secondary | ICD-10-CM | POA: Diagnosis not present

## 2021-10-22 DIAGNOSIS — E039 Hypothyroidism, unspecified: Secondary | ICD-10-CM | POA: Diagnosis not present

## 2021-10-22 DIAGNOSIS — R399 Unspecified symptoms and signs involving the genitourinary system: Secondary | ICD-10-CM | POA: Diagnosis not present

## 2021-10-22 DIAGNOSIS — E559 Vitamin D deficiency, unspecified: Secondary | ICD-10-CM | POA: Diagnosis not present

## 2021-10-22 DIAGNOSIS — D509 Iron deficiency anemia, unspecified: Secondary | ICD-10-CM | POA: Diagnosis not present

## 2021-10-22 DIAGNOSIS — E1129 Type 2 diabetes mellitus with other diabetic kidney complication: Secondary | ICD-10-CM | POA: Diagnosis not present

## 2021-10-22 DIAGNOSIS — R809 Proteinuria, unspecified: Secondary | ICD-10-CM | POA: Diagnosis not present

## 2021-10-22 DIAGNOSIS — E785 Hyperlipidemia, unspecified: Secondary | ICD-10-CM | POA: Diagnosis not present

## 2021-10-28 ENCOUNTER — Encounter: Payer: Self-pay | Admitting: Oncology

## 2021-10-28 ENCOUNTER — Telehealth: Payer: Self-pay | Admitting: Oncology

## 2021-10-28 NOTE — Telephone Encounter (Signed)
Patient has been scheduled. Aware of appt dates and times ? ? ?Scheduling Message ?Entered by Ulice Dash M on 10/28/2021 at  9:33 AM ?Priority: High ?<No visit type provided>  ?Department: [facility]  ?Provider:   ?Appointment Notes:  ?Please schedule pt for 2 doses of feraheme.  4/11 (at 11am) and 4/18 if possible.  ?Scheduling Notes:  ? ?

## 2021-10-28 NOTE — Addendum Note (Signed)
Addended by: Juanetta Beets on: 10/28/2021 09:35 AM ? ? Modules accepted: Orders ? ?

## 2021-10-30 ENCOUNTER — Encounter: Payer: Self-pay | Admitting: Oncology

## 2021-11-02 ENCOUNTER — Inpatient Hospital Stay: Payer: Medicare Other | Attending: Oncology

## 2021-11-02 VITALS — BP 156/71 | HR 74 | Temp 98.1°F | Resp 20 | Ht 63.0 in | Wt 168.8 lb

## 2021-11-02 DIAGNOSIS — D500 Iron deficiency anemia secondary to blood loss (chronic): Secondary | ICD-10-CM | POA: Insufficient documentation

## 2021-11-02 DIAGNOSIS — D509 Iron deficiency anemia, unspecified: Secondary | ICD-10-CM

## 2021-11-02 MED ORDER — SODIUM CHLORIDE 0.9 % IV SOLN: Freq: Once | INTRAVENOUS | Status: AC

## 2021-11-02 MED ORDER — SODIUM CHLORIDE 0.9 % IV SOLN
510.0000 mg | Freq: Once | INTRAVENOUS | Status: AC
  Administered 2021-11-02: 510 mg via INTRAVENOUS
  Filled 2021-11-02: qty 17

## 2021-11-02 NOTE — Patient Instructions (Signed)
Iron Deficiency Anemia, Adult ?Iron deficiency anemia is a condition in which the concentration of red blood cells or hemoglobin in the blood is below normal because of too little iron. Hemoglobin is a substance in red blood cells that carries oxygen to the body's tissues. When the concentration of red blood cells or hemoglobin is too low, not enough oxygen reaches these tissues. ?Iron deficiency anemia is usually long-lasting, and it develops over time. It may or may not cause symptoms. It is a common type of anemia. ?What are the causes? ?This condition may be caused by: ?Not enough iron in the diet. ?Abnormal absorption in the gut. ?Increased need for iron because of pregnancy or heavy menstrual periods, for females. ?Cancers of the gastrointestinal system, such as colon cancer. ?Blood loss caused by bleeding in the intestine. This may be from a gastrointestinal condition like Crohn's disease. ?Frequent blood draws, such as from blood donation. ?What increases the risk? ?The following factors may make you more likely to develop this condition: ?Being pregnant. ?Being a teenage girl going through a growth spurt. ?What are the signs or symptoms? ?Symptoms of this condition may include: ?Pale skin, lips, and nail beds. ?Weakness, dizziness, and getting tired easily. ?Headache. ?Shortness of breath when moving or exercising. ?Cold hands and feet. ?Fast or irregular heartbeat. ?Irritability or rapid breathing. These are more common in severe anemia. ?Mild anemia may not cause any symptoms. ?How is this diagnosed? ?This condition is diagnosed based on: ?Your medical history. ?A physical exam. ?Blood tests. ?You may have additional tests to find the underlying cause of your anemia, such as: ?Testing for blood in the stool (fecal occult blood test). ?A procedure to see inside your colon and rectum (colonoscopy). ?A procedure to see inside your esophagus and stomach (endoscopy). ?A test in which cells are removed from  bone marrow (bone marrow aspiration) or fluid is removed from the bone marrow to be examined. This is rarely needed. ?How is this treated? ?This condition is treated by correcting the cause of your iron deficiency. Treatment may involve: ?Adding iron-rich foods to your diet. ?Taking iron supplements. If you are pregnant or breastfeeding, you may need to take extra iron because your normal diet usually does not provide the amount of iron that you need. ?Increasing vitamin C intake. Vitamin C helps your body absorb iron. Your health care provider may recommend that you take iron supplements along with a glass of orange juice or a vitamin C supplement. ?Medicines to make heavy menstrual flow lighter. ?Surgery. ?You may need repeat blood tests to determine whether treatment is working. If the treatment does not seem to be working, you may need more tests. ?Follow these instructions at home: ?Medicines ?Take over-the-counter and prescription medicines only as told by your health care provider. This includes iron supplements and vitamins. ?For the best iron absorption, you should take iron supplements when your stomach is empty. If you cannot tolerate them on an empty stomach, you may need to take them with food. ?Do not drink milk or take antacids at the same time as your iron supplements. Milk and antacids may interfere with iron absorption. ?Iron supplements may turn stool (feces) a darker color and it may appear black. ?If you cannot tolerate taking iron supplements by mouth, talk with your health care provider about taking them through an IV or through an injection into a muscle. ?Eating and drinking ? ?Talk with your health care provider before changing your diet. He or she may recommend  that you eat foods that contain a lot of iron, such as: ?Liver. ?Low-fat (lean) beef. ?Breads and cereals that have iron added to them (are fortified). ?Eggs. ?Dried fruit. ?Dark green, leafy vegetables. ?To help your body use the  iron from iron-rich foods, eat those foods at the same time as fresh fruits and vegetables that are high in vitamin C. Foods that are high in vitamin C include: ?Oranges. ?Peppers. ?Tomatoes. ?Mangoes. ?Drink enough fluid to keep your urine pale yellow. ?Managing constipation ?If you are taking an iron supplement, it may cause constipation. To prevent or treat constipation, you may need to: ?Take over-the-counter or prescription medicines. ?Eat foods that are high in fiber, such as beans, whole grains, and fresh fruits and vegetables. ?Limit foods that are high in fat and processed sugars, such as fried or sweet foods. ?General instructions ?Return to your normal activities as told by your health care provider. Ask your health care provider what activities are safe for you. ?Practice good hygiene. Anemia can make you more prone to illness and infection. ?Keep all follow-up visits as told by your health care provider. This is important. ?Contact a health care provider if you: ?Feel nauseous or you vomit. ?Feel weak. ?Have unexplained sweating. ?Develop symptoms of constipation, such as: ?Having fewer than three bowel movements a week. ?Straining to have a bowel movement. ?Having stools that are hard, dry, or larger than normal. ?Feeling full or bloated. ?Pain in the lower abdomen. ?Not feeling relief after having a bowel movement. ?Get help right away if you: ?Faint. If this happens, do not drive yourself to the hospital. ?Have chest pain. ?Have shortness of breath that: ?Is severe. ?Gets worse with physical activity. ?Have an irregular or rapid heartbeat. ?Become light-headed when getting up from a sitting or lying down position. ?These symptoms may represent a serious problem that is an emergency. Do not wait to see if the symptoms will go away. Get medical help right away. Call your local emergency services (911 in the [place] Do not drive yourself to the hospital. ?Summary ?Iron deficiency anemia is a condition in  which the concentration of red blood cells or hemoglobin in the blood is below normal because of too little iron. ?This condition is treated by correcting the cause of your iron deficiency. ?Take over-the-counter and prescription medicines only as told by your health care provider. This includes iron supplements and vitamins. ?To help your body use the iron from iron-rich foods, eat those foods at the same time as fresh fruits and vegetables that are high in vitamin C. ?Get help right away if you have shortness of breath that gets worse with physical activity. ?This information is not intended to replace advice given to you by your health care provider. Make sure you discuss any questions you have with your health care provider. ?Document Revised: 03/19/2019 Document Reviewed: 03/19/2019 ?Elsevier Patient Education ? [place]. ?Iron-Rich Diet ?Iron is a mineral that helps your body produce hemoglobin. Hemoglobin is a protein in red blood cells that carries oxygen to your body's tissues. Eating too little iron may cause you to feel weak and tired, and it can increase your risk of infection. Iron is naturally found in many foods, and many foods have iron added to them (are iron-fortified). ?You may need to follow an iron-rich diet if you do not have enough iron in your body due to certain medical conditions. The amount of iron that you need each day depends on your age, your  sex, and any medical conditions you have. Follow instructions from your health care provider or a dietitian about how much iron you should eat each day. ?What are tips for following this plan? ?Reading food labels ?Check food labels to see how many milligrams (mg) of iron are in each serving. ?Cooking ?Cook foods in pots and pans that are made from iron. ?Take these steps to make it easier for your body to absorb iron from certain foods: ?Soak beans overnight before cooking. ?Soak whole grains overnight and drain them before  using. ?Ferment flours before baking, such as by using yeast in bread dough. ?Meal planning ?When you eat foods that contain iron, you should eat them with foods that are high in vitamin C. These include oranges, pep

## 2021-11-08 DIAGNOSIS — L820 Inflamed seborrheic keratosis: Secondary | ICD-10-CM | POA: Diagnosis not present

## 2021-11-08 DIAGNOSIS — L570 Actinic keratosis: Secondary | ICD-10-CM | POA: Diagnosis not present

## 2021-11-08 DIAGNOSIS — L578 Other skin changes due to chronic exposure to nonionizing radiation: Secondary | ICD-10-CM | POA: Diagnosis not present

## 2021-11-09 ENCOUNTER — Inpatient Hospital Stay: Payer: Medicare Other

## 2021-11-09 VITALS — BP 150/55 | HR 74 | Temp 98.4°F | Resp 20 | Ht 63.0 in | Wt 169.2 lb

## 2021-11-09 DIAGNOSIS — D509 Iron deficiency anemia, unspecified: Secondary | ICD-10-CM

## 2021-11-09 DIAGNOSIS — D500 Iron deficiency anemia secondary to blood loss (chronic): Secondary | ICD-10-CM

## 2021-11-09 MED ORDER — SODIUM CHLORIDE 0.9 % IV SOLN
510.0000 mg | Freq: Once | INTRAVENOUS | Status: AC
  Administered 2021-11-09: 510 mg via INTRAVENOUS
  Filled 2021-11-09: qty 510

## 2021-11-09 MED ORDER — SODIUM CHLORIDE 0.9 % IV SOLN: Freq: Once | INTRAVENOUS | Status: AC

## 2021-11-09 NOTE — Patient Instructions (Signed)
Iron Deficiency Anemia, Adult ?Iron deficiency anemia is a condition in which the concentration of red blood cells or hemoglobin in the blood is below normal because of too little iron. Hemoglobin is a substance in red blood cells that carries oxygen to the body's tissues. When the concentration of red blood cells or hemoglobin is too low, not enough oxygen reaches these tissues. ?Iron deficiency anemia is usually long-lasting, and it develops over time. It may or may not cause symptoms. It is a common type of anemia. ?What are the causes? ?This condition may be caused by: ?Not enough iron in the diet. ?Abnormal absorption in the gut. ?Increased need for iron because of pregnancy or heavy menstrual periods, for females. ?Cancers of the gastrointestinal system, such as colon cancer. ?Blood loss caused by bleeding in the intestine. This may be from a gastrointestinal condition like Crohn's disease. ?Frequent blood draws, such as from blood donation. ?What increases the risk? ?The following factors may make you more likely to develop this condition: ?Being pregnant. ?Being a teenage girl going through a growth spurt. ?What are the signs or symptoms? ?Symptoms of this condition may include: ?Pale skin, lips, and nail beds. ?Weakness, dizziness, and getting tired easily. ?Headache. ?Shortness of breath when moving or exercising. ?Cold hands and feet. ?Fast or irregular heartbeat. ?Irritability or rapid breathing. These are more common in severe anemia. ?Mild anemia may not cause any symptoms. ?How is this diagnosed? ?This condition is diagnosed based on: ?Your medical history. ?A physical exam. ?Blood tests. ?You may have additional tests to find the underlying cause of your anemia, such as: ?Testing for blood in the stool (fecal occult blood test). ?A procedure to see inside your colon and rectum (colonoscopy). ?A procedure to see inside your esophagus and stomach (endoscopy). ?A test in which cells are removed from  bone marrow (bone marrow aspiration) or fluid is removed from the bone marrow to be examined. This is rarely needed. ?How is this treated? ?This condition is treated by correcting the cause of your iron deficiency. Treatment may involve: ?Adding iron-rich foods to your diet. ?Taking iron supplements. If you are pregnant or breastfeeding, you may need to take extra iron because your normal diet usually does not provide the amount of iron that you need. ?Increasing vitamin C intake. Vitamin C helps your body absorb iron. Your health care provider may recommend that you take iron supplements along with a glass of orange juice or a vitamin C supplement. ?Medicines to make heavy menstrual flow lighter. ?Surgery. ?You may need repeat blood tests to determine whether treatment is working. If the treatment does not seem to be working, you may need more tests. ?Follow these instructions at home: ?Medicines ?Take over-the-counter and prescription medicines only as told by your health care provider. This includes iron supplements and vitamins. ?For the best iron absorption, you should take iron supplements when your stomach is empty. If you cannot tolerate them on an empty stomach, you may need to take them with food. ?Do not drink milk or take antacids at the same time as your iron supplements. Milk and antacids may interfere with iron absorption. ?Iron supplements may turn stool (feces) a darker color and it may appear black. ?If you cannot tolerate taking iron supplements by mouth, talk with your health care provider about taking them through an IV or through an injection into a muscle. ?Eating and drinking ? ?Talk with your health care provider before changing your diet. He or she may recommend  that you eat foods that contain a lot of iron, such as: ?Liver. ?Low-fat (lean) beef. ?Breads and cereals that have iron added to them (are fortified). ?Eggs. ?Dried fruit. ?Dark green, leafy vegetables. ?To help your body use the  iron from iron-rich foods, eat those foods at the same time as fresh fruits and vegetables that are high in vitamin C. Foods that are high in vitamin C include: ?Oranges. ?Peppers. ?Tomatoes. ?Mangoes. ?Drink enough fluid to keep your urine pale yellow. ?Managing constipation ?If you are taking an iron supplement, it may cause constipation. To prevent or treat constipation, you may need to: ?Take over-the-counter or prescription medicines. ?Eat foods that are high in fiber, such as beans, whole grains, and fresh fruits and vegetables. ?Limit foods that are high in fat and processed sugars, such as fried or sweet foods. ?General instructions ?Return to your normal activities as told by your health care provider. Ask your health care provider what activities are safe for you. ?Practice good hygiene. Anemia can make you more prone to illness and infection. ?Keep all follow-up visits as told by your health care provider. This is important. ?Contact a health care provider if you: ?Feel nauseous or you vomit. ?Feel weak. ?Have unexplained sweating. ?Develop symptoms of constipation, such as: ?Having fewer than three bowel movements a week. ?Straining to have a bowel movement. ?Having stools that are hard, dry, or larger than normal. ?Feeling full or bloated. ?Pain in the lower abdomen. ?Not feeling relief after having a bowel movement. ?Get help right away if you: ?Faint. If this happens, do not drive yourself to the hospital. ?Have chest pain. ?Have shortness of breath that: ?Is severe. ?Gets worse with physical activity. ?Have an irregular or rapid heartbeat. ?Become light-headed when getting up from a sitting or lying down position. ?These symptoms may represent a serious problem that is an emergency. Do not wait to see if the symptoms will go away. Get medical help right away. Call your local emergency services (911 in the [place] Do not drive yourself to the hospital. ?Summary ?Iron deficiency anemia is a condition in  which the concentration of red blood cells or hemoglobin in the blood is below normal because of too little iron. ?This condition is treated by correcting the cause of your iron deficiency. ?Take over-the-counter and prescription medicines only as told by your health care provider. This includes iron supplements and vitamins. ?To help your body use the iron from iron-rich foods, eat those foods at the same time as fresh fruits and vegetables that are high in vitamin C. ?Get help right away if you have shortness of breath that gets worse with physical activity. ?This information is not intended to replace advice given to you by your health care provider. Make sure you discuss any questions you have with your health care provider. ?Document Revised: 03/19/2019 Document Reviewed: 03/19/2019 ?[employer] Patient Education ? [name]. ?Iron-Rich Diet ? ?Iron is a mineral that helps your body produce hemoglobin. Hemoglobin is a protein in red blood cells that carries oxygen to your body's tissues. Eating too little iron may cause you to feel weak and tired, and it can increase your risk of infection. Iron is naturally found in many foods, and many foods have iron added to them (are iron-fortified). ?You may need to follow an iron-rich diet if you do not have enough iron in your body due to certain medical conditions. The amount of iron that you need each day depends on your age,  your sex, and any medical conditions you have. Follow instructions from your health care provider or a dietitian about how much iron you should eat each day. ?What are tips for following this plan? ?Reading food labels ?Check food labels to see how many milligrams (mg) of iron are in each serving. ?Cooking ?Cook foods in pots and pans that are made from iron. ?Take these steps to make it easier for your body to absorb iron from certain foods: ?Soak beans overnight before cooking. ?Soak whole grains overnight and drain them before  using. ?Ferment flours before baking, such as by using yeast in bread dough. ?Meal planning ?When you eat foods that contain iron, you should eat them with foods that are high in vitamin C. These include oranges, p

## 2021-11-22 ENCOUNTER — Other Ambulatory Visit: Payer: Self-pay

## 2021-11-22 ENCOUNTER — Encounter: Payer: Self-pay | Admitting: Hematology and Oncology

## 2021-11-22 ENCOUNTER — Inpatient Hospital Stay: Payer: Medicare Other | Attending: Oncology | Admitting: Hematology and Oncology

## 2021-11-22 ENCOUNTER — Inpatient Hospital Stay: Payer: Medicare Other

## 2021-11-22 DIAGNOSIS — D500 Iron deficiency anemia secondary to blood loss (chronic): Secondary | ICD-10-CM | POA: Insufficient documentation

## 2021-11-22 DIAGNOSIS — D649 Anemia, unspecified: Secondary | ICD-10-CM | POA: Diagnosis not present

## 2021-11-22 DIAGNOSIS — Z79899 Other long term (current) drug therapy: Secondary | ICD-10-CM | POA: Diagnosis not present

## 2021-11-22 DIAGNOSIS — R040 Epistaxis: Secondary | ICD-10-CM | POA: Diagnosis not present

## 2021-11-22 DIAGNOSIS — K7682 Hepatic encephalopathy: Secondary | ICD-10-CM | POA: Diagnosis not present

## 2021-11-22 DIAGNOSIS — R188 Other ascites: Secondary | ICD-10-CM | POA: Diagnosis not present

## 2021-11-22 LAB — IRON AND TIBC
Iron: 120 ug/dL (ref 28–170)
Saturation Ratios: 32 % — ABNORMAL HIGH (ref 10.4–31.8)
TIBC: 370 ug/dL (ref 250–450)
UIBC: 250 ug/dL

## 2021-11-22 LAB — COMPREHENSIVE METABOLIC PANEL
Albumin: 3.9 (ref 3.5–5.0)
Albumin: 3.9 (ref 3.5–5.0)
Calcium: 9.6 (ref 8.7–10.7)
Calcium: 9.6 (ref 8.7–10.7)

## 2021-11-22 LAB — BASIC METABOLIC PANEL
BUN: 13 (ref 4–21)
BUN: 13 (ref 4–21)
CO2: 25 — AB (ref 13–22)
CO2: 25 — AB (ref 13–22)
Chloride: 109 — AB (ref 99–108)
Chloride: 109 — AB (ref 99–108)
Creatinine: 0.7 (ref 0.5–1.1)
Creatinine: 0.7 (ref 0.5–1.1)
Glucose: 156
Glucose: 156
Potassium: 3.8 mEq/L (ref 3.5–5.1)
Potassium: 3.8 mEq/L (ref 3.5–5.1)
Sodium: 143 (ref 137–147)
Sodium: 143 (ref 137–147)

## 2021-11-22 LAB — HEPATIC FUNCTION PANEL
ALT: 22 U/L (ref 7–35)
ALT: 22 U/L (ref 7–35)
AST: 34 (ref 13–35)
AST: 34 (ref 13–35)
Alkaline Phosphatase: 65 (ref 25–125)
Alkaline Phosphatase: 65 (ref 25–125)
Bilirubin, Total: 1.3
Bilirubin, Total: 1.3

## 2021-11-22 LAB — CBC AND DIFFERENTIAL
HCT: 36 (ref 36–46)
HCT: 36 (ref 36–46)
Hemoglobin: 11.7 — AB (ref 12.0–16.0)
Hemoglobin: 11.7 — AB (ref 12.0–16.0)
Neutrophils Absolute: 2.8
Neutrophils Absolute: 2.8
Platelets: 108 10*3/uL — AB (ref 150–400)
Platelets: 108 10*3/uL — AB (ref 150–400)
WBC: 4.3
WBC: 4.3

## 2021-11-22 LAB — FERRITIN: Ferritin: 181 ng/mL (ref 11–307)

## 2021-11-22 LAB — CBC
RBC: 3.76 — AB (ref 3.87–5.11)
RBC: 3.76 — AB (ref 3.87–5.11)

## 2021-11-22 NOTE — Assessment & Plan Note (Signed)
Hepatic encephalopathy. Ammonia is normal today at 9.0. ?

## 2021-11-22 NOTE — Progress Notes (Signed)
Patient Care Team: Nicoletta Dress, MD as PCP - General (Internal Medicine) Berniece Salines, DO as PCP - Cardiology (Cardiology) Derwood Kaplan, MD as Consulting Physician (Oncology) Maylon Cos, MD as Referring Physician (Gastroenterology)  Clinic Day:  11/22/2021  Referring physician: Nicoletta Dress, MD  ASSESSMENT & PLAN:   Assessment & Plan: Iron deficiency anemia due to chronic blood loss Iron deficiency anemia, felt to be secondary to chronic GI blood loss from AVM's, as well as intermittent epistaxis. She has required regular IV iron replacement and transfusions.  She saw Dr. Gershon Cull at [facility] and he did cauterize her AVM's.  She finally stabilized and stopped bleeding. She has not required transfusions or IV iron since the summer of 2022.  However she now had a severe drop of her hemoglobin from 11.5 to 9.9. She received IV iron and has responded well. There was concern that the 5FU treatment she was doing on her face may have had a systemic effect. Today, hemoglobin is back up to 11.7. She has completed treatment with topical 5FU. We will plan to see her back in 6 weeks for reassessment.  Ascites Ascites, improved, for which she is on spironolactone and furosemide. She knows to continue spironolactone 50 mg BID.  Encephalopathy, hepatic (HCC) Hepatic encephalopathy. Ammonia is normal today at 9.0.   The patient understands the plans discussed today and is in agreement with them.  She knows to contact our office if she develops concerns prior to her next appointment.     Melodye Ped, NP  [facility] [address] [place] [address] Dept: [phone] Dept Fax: [phone]   Orders Placed This Encounter  Procedures   CBC and differential    This external order was created through the Results Console.   CBC    This external order was created through the Results Console.   Basic  metabolic panel    This external order was created through the Results Console.   Comprehensive metabolic panel    This external order was created through the Results Console.   Hepatic function panel    This external order was created through the Results Console.   CBC and differential    This external order was created through the Results Console.   CBC    This external order was created through the Results Console.   Basic metabolic panel    This external order was created through the Results Console.   Comprehensive metabolic panel    This external order was created through the Results Console.   Hepatic function panel    This external order was created through the Results Console.      CHIEF COMPLAINT:  CC: A 71 year old female with history of iron deficiency anemia here for 6 week evaluation  Current Treatment:  Surveillance  INTERVAL HISTORY:  Tamorah is here today for repeat clinical assessment. She denies fevers or chills. She denies pain. Her appetite is good. Her weight has been stable.  I have reviewed the past medical history, past surgical history, social history and family history with the patient and they are unchanged from previous note.  ALLERGIES:  has No Known Allergies.  MEDICATIONS:  Current Outpatient Medications  Medication Sig Dispense Refill   albuterol (VENTOLIN HFA) 108 (90 Base) MCG/ACT inhaler Inhale into the lungs.     colesevelam (WELCHOL) 625 MG tablet Take 1,875 mg by mouth 2 (two) times daily.  ezetimibe (ZETIA) 10 MG tablet Take 10 mg by mouth daily.     furosemide (LASIX) 40 MG tablet Take 1 tablet by mouth daily.     insulin aspart (NOVOLOG) 100 UNIT/ML FlexPen Inject 30 Units into the skin 2 (two) times daily.     lactulose (CHRONULAC) 10 GM/15ML solution Take 20 g by mouth 1 day or 1 dose.  5   LEVEMIR FLEXTOUCH 100 UNIT/ML Pen Inject 40 Units into the skin 2 (two) times daily.  12   metFORMIN (GLUCOPHAGE-XR) 500 MG 24 hr tablet Take  500 mg by mouth 2 (two) times daily with a meal.  5   ondansetron (ZOFRAN) 4 MG tablet Take 1 tablet (4 mg total) by mouth every 4 (four) hours as needed for nausea. 90 tablet 3   pregabalin (LYRICA) 75 MG capsule Take 75 mg by mouth at bedtime.  1   promethazine-codeine (PHENERGAN WITH CODEINE) 6.25-10 MG/5ML syrup Take 5 mLs by mouth every 6 (six) hours as needed.     rOPINIRole (REQUIP) 2 MG tablet Take 1 tablet by mouth at bedtime.  3   spironolactone (ALDACTONE) 50 MG tablet Take 1 tablet (50 mg total) by mouth daily. 90 tablet 1   valACYclovir (VALTREX) 1000 MG tablet Take 1,000 mg by mouth 2 (two) times daily.     Vitamin D, Ergocalciferol, (DRISDOL) 1.25 MG (50000 UT) CAPS capsule Take 1 capsule by mouth once a week.     No current facility-administered medications for this visit.    HISTORY OF PRESENT ILLNESS:   Oncology History  Iron deficiency anemia      REVIEW OF SYSTEMS:   Constitutional: Denies fevers, chills or abnormal weight loss Eyes: Denies blurriness of vision Ears, nose, mouth, throat, and face: Denies mucositis or sore throat Respiratory: Denies cough, dyspnea or wheezes Cardiovascular: Denies palpitation, chest discomfort or lower extremity swelling Gastrointestinal:  Denies nausea, heartburn or change in bowel habits Skin: Denies abnormal skin rashes Lymphatics: Denies new lymphadenopathy or easy bruising Neurological:Denies numbness, tingling or new weaknesses Behavioral/Psych: Mood is stable, no new changes  All other systems were reviewed with the patient and are negative.   VITALS:  Blood pressure (!) 153/69, pulse 75, temperature 98 F (36.7 C), temperature source Oral, resp. rate 20, height '5\' 3"'$  (1.6 m), weight 165 lb 11.2 oz (75.2 kg), SpO2 99 %.  Wt Readings from Last 3 Encounters:  11/22/21 165 lb 11.2 oz (75.2 kg)  11/09/21 169 lb 4 oz (76.8 kg)  11/02/21 168 lb 12 oz (76.5 kg)    Body mass index is 29.35 kg/m.  Performance status  (ECOG): 1 - Symptomatic but completely ambulatory  PHYSICAL EXAM:   GENERAL:alert, no distress and comfortable SKIN: skin color, texture, turgor are normal, no rashes or significant lesions EYES: normal, Conjunctiva are pink and non-injected, sclera clear OROPHARYNX:no exudate, no erythema and lips, buccal mucosa, and tongue normal  NECK: supple, thyroid normal size, non-tender, without nodularity LYMPH:  no palpable lymphadenopathy in the cervical, axillary or inguinal LUNGS: clear to auscultation and percussion with normal breathing effort HEART: regular rate & rhythm and no murmurs and no lower extremity edema ABDOMEN:abdomen soft, non-tender and normal bowel sounds Musculoskeletal:no cyanosis of digits and no clubbing  NEURO: alert & oriented x 3 with fluent speech, no focal motor/sensory deficits  LABORATORY DATA:  I have reviewed the data as listed    Component Value Date/Time   NA 143 11/22/2021 0000   NA 143 11/22/2021 0000  K 3.8 11/22/2021 0000   K 3.8 11/22/2021 0000   CL 109 (A) 11/22/2021 0000   CL 109 (A) 11/22/2021 0000   CO2 25 (A) 11/22/2021 0000   CO2 25 (A) 11/22/2021 0000   GLUCOSE 143 (H) 04/18/2011 0607   BUN 13 11/22/2021 0000   BUN 13 11/22/2021 0000   CREATININE 0.7 11/22/2021 0000   CREATININE 0.7 11/22/2021 0000   CREATININE <0.47 (L) 04/18/2011 0607   CALCIUM 9.6 11/22/2021 0000   CALCIUM 9.6 11/22/2021 0000   PROT 7.0 04/14/2011 1758   ALBUMIN 3.9 11/22/2021 0000   ALBUMIN 3.9 11/22/2021 0000   AST 34 11/22/2021 0000   AST 34 11/22/2021 0000   ALT 22 11/22/2021 0000   ALT 22 11/22/2021 0000   ALKPHOS 65 11/22/2021 0000   ALKPHOS 65 11/22/2021 0000   BILITOT 1.0 04/14/2011 1758   GFRNONAA 79 03/12/2021 0000   GFRAA NOT CALCULATED 04/18/2011 0607    No results found for: SPEP, UPEP  Lab Results  Component Value Date   WBC 4.3 11/22/2021   WBC 4.3 11/22/2021   NEUTROABS 2.80 11/22/2021   NEUTROABS 2.80 11/22/2021   HGB 11.7 (A)  11/22/2021   HGB 11.7 (A) 11/22/2021   HCT 36 11/22/2021   HCT 36 11/22/2021   MCV 98 11/26/2020   PLT 108 (A) 11/22/2021   PLT 108 (A) 11/22/2021      Chemistry      Component Value Date/Time   NA 143 11/22/2021 0000   NA 143 11/22/2021 0000   K 3.8 11/22/2021 0000   K 3.8 11/22/2021 0000   CL 109 (A) 11/22/2021 0000   CL 109 (A) 11/22/2021 0000   CO2 25 (A) 11/22/2021 0000   CO2 25 (A) 11/22/2021 0000   BUN 13 11/22/2021 0000   BUN 13 11/22/2021 0000   CREATININE 0.7 11/22/2021 0000   CREATININE 0.7 11/22/2021 0000   CREATININE <0.47 (L) 04/18/2011 0607   GLU 156 11/22/2021 0000   GLU 156 11/22/2021 0000      Component Value Date/Time   CALCIUM 9.6 11/22/2021 0000   CALCIUM 9.6 11/22/2021 0000   ALKPHOS 65 11/22/2021 0000   ALKPHOS 65 11/22/2021 0000   AST 34 11/22/2021 0000   AST 34 11/22/2021 0000   ALT 22 11/22/2021 0000   ALT 22 11/22/2021 0000   BILITOT 1.0 04/14/2011 1758       RADIOGRAPHIC STUDIES: I have personally reviewed the radiological images as listed and agreed with the findings in the report. No results found.

## 2021-11-22 NOTE — Assessment & Plan Note (Signed)
Ascites, improved, for which she is on spironolactone and furosemide. She knows to continue spironolactone 50 mg BID. ?

## 2021-11-22 NOTE — Assessment & Plan Note (Signed)
Iron deficiency anemia, felt to be secondary to chronic GI blood loss from AVM's, as well as intermittent epistaxis. She has required regular IV iron replacement and transfusions.  She saw Dr. Gershon Cull at [facility] and he did cauterize her AVM's. ?She finally stabilized and stopped bleeding. She has not required transfusions or IV iron since the summer of 2022.  However she now had a severe drop of her hemoglobin from 11.5 to 9.9. She received IV iron and has responded well. There was concern that the 5FU treatment she was doing on her face may have had a systemic effect. Today, hemoglobin is back up to 11.7. She has completed treatment with topical 5FU. We will plan to see her back in 6 weeks for reassessment. ?

## 2021-11-23 ENCOUNTER — Other Ambulatory Visit: Payer: Self-pay | Admitting: Hematology and Oncology

## 2021-12-26 ENCOUNTER — Other Ambulatory Visit: Payer: Self-pay | Admitting: Oncology

## 2022-01-02 ENCOUNTER — Other Ambulatory Visit: Payer: Self-pay | Admitting: Oncology

## 2022-01-02 DIAGNOSIS — K7460 Unspecified cirrhosis of liver: Secondary | ICD-10-CM

## 2022-01-03 ENCOUNTER — Telehealth: Payer: Self-pay | Admitting: Oncology

## 2022-01-03 ENCOUNTER — Encounter: Payer: Self-pay | Admitting: Oncology

## 2022-01-03 ENCOUNTER — Other Ambulatory Visit: Payer: Self-pay

## 2022-01-03 ENCOUNTER — Inpatient Hospital Stay: Payer: Medicare Other | Attending: Oncology | Admitting: Oncology

## 2022-01-03 ENCOUNTER — Other Ambulatory Visit: Payer: Self-pay | Admitting: Oncology

## 2022-01-03 ENCOUNTER — Inpatient Hospital Stay: Payer: Medicare Other

## 2022-01-03 VITALS — BP 146/64 | HR 73 | Temp 97.7°F | Resp 18 | Ht 63.0 in | Wt 168.1 lb

## 2022-01-03 DIAGNOSIS — K7460 Unspecified cirrhosis of liver: Secondary | ICD-10-CM

## 2022-01-03 DIAGNOSIS — D649 Anemia, unspecified: Secondary | ICD-10-CM | POA: Diagnosis not present

## 2022-01-03 DIAGNOSIS — D689 Coagulation defect, unspecified: Secondary | ICD-10-CM

## 2022-01-03 DIAGNOSIS — D696 Thrombocytopenia, unspecified: Secondary | ICD-10-CM | POA: Diagnosis not present

## 2022-01-03 DIAGNOSIS — K7581 Nonalcoholic steatohepatitis (NASH): Secondary | ICD-10-CM | POA: Diagnosis not present

## 2022-01-03 DIAGNOSIS — D500 Iron deficiency anemia secondary to blood loss (chronic): Secondary | ICD-10-CM

## 2022-01-03 DIAGNOSIS — R188 Other ascites: Secondary | ICD-10-CM | POA: Diagnosis not present

## 2022-01-03 DIAGNOSIS — Z79899 Other long term (current) drug therapy: Secondary | ICD-10-CM | POA: Diagnosis not present

## 2022-01-03 DIAGNOSIS — K7682 Hepatic encephalopathy: Secondary | ICD-10-CM | POA: Diagnosis not present

## 2022-01-03 DIAGNOSIS — K922 Gastrointestinal hemorrhage, unspecified: Secondary | ICD-10-CM | POA: Insufficient documentation

## 2022-01-03 LAB — HEPATIC FUNCTION PANEL
ALT: 26 U/L (ref 7–35)
AST: 38 — AB (ref 13–35)
Alkaline Phosphatase: 81 (ref 25–125)
Bilirubin, Total: 0.8

## 2022-01-03 LAB — CBC: RBC: 3.77 — AB (ref 3.87–5.11)

## 2022-01-03 LAB — CBC AND DIFFERENTIAL
HCT: 37 (ref 36–46)
Hemoglobin: 11.8 — AB (ref 12.0–16.0)
Neutrophils Absolute: 3.7
Platelets: 109 10*3/uL — AB (ref 150–400)
WBC: 4.8

## 2022-01-03 LAB — PROTIME-INR
INR: 1.2 (ref 0.8–1.2)
Prothrombin Time: 15.3 seconds — ABNORMAL HIGH (ref 11.4–15.2)

## 2022-01-03 LAB — COMPREHENSIVE METABOLIC PANEL
Albumin: 4.1 (ref 3.5–5.0)
Calcium: 9 (ref 8.7–10.7)

## 2022-01-03 LAB — BASIC METABOLIC PANEL
BUN: 15 (ref 4–21)
CO2: 24 — AB (ref 13–22)
Chloride: 106 (ref 99–108)
Creatinine: 0.7 (ref 0.5–1.1)
Glucose: 215
Potassium: 4.5 mEq/L (ref 3.5–5.1)
Sodium: 139 (ref 137–147)

## 2022-01-03 LAB — AMMONIA: Ammonia: <9

## 2022-01-03 NOTE — Telephone Encounter (Signed)
Per 01/03/22 los next appt scheduled and confirmed with patient

## 2022-01-04 LAB — AFP TUMOR MARKER: AFP, Serum, Tumor Marker: 5.3 ng/mL (ref 0.0–9.2)

## 2022-01-05 NOTE — Progress Notes (Signed)
Patient Care Team: Nicoletta Dress, MD as PCP - General (Internal Medicine) Berniece Salines, DO as PCP - Cardiology (Cardiology) Derwood Kaplan, MD as Consulting Physician (Oncology) Maylon Cos, MD as Referring Physician (Gastroenterology)  Clinic Day: 01/03/22  Referring physician: Nicoletta Dress, MD  ASSESSMENT & PLAN:   Assessment & Plan: 1. Iron deficiency anemia, felt to be secondary to chronic GI blood loss from AVM's, as well as intermittent epistaxis. She has required regular IV iron replacement and transfusions.  She saw Dr. Gershon Cull at [facility] and he did cauterize her AVM's.  She finally stabilized and stopped bleeding. She has not required transfusions since the summer of 2022.  However her hemoglobin dropped to 9.9 in March and she was found to be iron deficient.  She was given IV iron in April 2023   2. Non-alcoholic liver cirrhosis, with intermittent mild thrombocytopenia.  Her anemia is also likely partly related to the cirrhosis.   3. Ascites, improved, for which she is on spironolactone and furosemide.   4. Hepatic encephalopathy.  She had a slight increase in her serum ammonia due to partial noncompliance. She now continues recommended dosing of lactulose, and her ammonia remains normal.    5. Multiple AVM's of the small bowel seen on capsule endoscopy in [facility], treated at [facility] in February 2021.   She saw Dr. Gershon Cull in June and two small AVMs were treated with argon plasma coagulation.  Her hemoglobin has been stable and she has not required further transfusions.   6.  Thrombocytopenia, mild, and stable.  We will continue to monitor.   She is doing well and her blood counts have stabilized after receiving IV iron in April.  Dr. Delena Bali will be seeing her at the end of June and so I can see her back in 2 months with CBC and comprehensive metabolic profile. The patient understands the plans discussed today and is in agreement  with them.  She knows to contact our office if she develops concerns prior to her next appointment.     Derwood Kaplan, MD  [facility] [address] [place] [address] Dept: [phone] Dept Fax: [phone]   Orders Placed This Encounter  Procedures   CBC and differential    This external order was created through the Results Console.   CBC    This external order was created through the Results Console.   Basic metabolic panel    This external order was created through the Results Console.   Comprehensive metabolic panel    This external order was created through the Results Console.   Hepatic function panel    This external order was created through the Results Console.   Ammonia    This order was created through External Result Entry      CHIEF COMPLAINT:  CC: A 71 year old female with history of iron deficiency anemia here for 6 week evaluation  Current Treatment:  Surveillance   HISTORY OF PRESENT ILLNESS:  [name] is a 71 y.o. female  who we began seeing in December 2019 for iron deficiency anemia. In November 2018, colonoscopy revealed tubular adenomas.  Upper endoscopy in January 2018 did not reveal any specific findings.  She was clearly iron deficient.  She has multiple comorbidities, including diabetes, liver cirrhosis, COPD, hyperlipidemia, and degenerative disc disease.  Testing for any other deficiency or monoclonal spike was negative. She had  already been on oral supplement for nearly 1 year with lack of response, so was given IV iron in the form of Feraheme on several occasions. Repeat colonoscopy and EGD were done in August 2020 by Dr. Noberto Retort.  Biopsy was negative.  When we saw her later in September, the hemoglobin had dropped down to 7.5.  She was transfused with 2 units of packed red blood cells.  Repeat iron studies once again revealed iron deficiency, so was given IV  Feraheme.  CT abdomen and pelvis was negative.  She felt so much better after the transfusion that she jumped out of bed and fractured her right foot in a fall.  She was given IV Feraheme again in November.  Capsule endoscopy in November 2020 in [facility] revealed multiple AVM's of the small bowel.  She had cauterization of an AVM of the small bowel at [facility] in February 2021. In March 2021, her hemoglobin dropped back down 7.6.  She therefore received IV Feraheme again in early April.  Abdominal ultrasound in May revealed cirrhosis of the liver with nodular surface, increased echogenicity and heterogeneity.  No focal liver parenchymal lesion was observed.  She has had intermittent mild thrombocytopenia, presumably from her liver cirrhosis. She received IV Feraheme in June 2021, and received 2 units of packed red blood cells at that time. She was admitted to [facility] later in June with generalized weakness, altered mental status, and UTI.  While out of town, she developed severe epistaxis that required packing and 1 unit of packed red blood cells.  CT imaging revealed small amount of blood in the right maxillary and sphenoid sinuses with tube placed in the right paranasal sinus.     She developed a severe epistaxis requiring hospital admission in late July 2021 and had a nasal bullet in place.  Her hemoglobin dropped down to 6.9, she was transfused, and given a dose of IV Feraheme while in the hospital.  She had an episode of hypotension and fever and was felt to be septic, so was transferred to the ICU.  She was placed on broad-spectrum IV antibiotics, and cultures remained negative.  Echocardiogram showed a suspicious mass in the right atrium, which may be attached to the interatrial septum.  The cardiologist reviewed the echocardiogram and felt that this was likely an artifact and not a mass, vegetation or thrombus. Her hemoglobin was 8.7 at the time of discharge, and she  was seen in August with weakness  of her hands,shakiness, dropping objects, fatigue, dyspnea with exertion and moderate abdominal swelling.  She had asterixis, and ammonia level was elevated at 62.  She was therefore placed on lactulose 30 mL twice a day. She was seen a week later with some improvement, but her serum ammonia was still 54, so the lactulose was increased to TID.  She also had hyperkalemia, so her spironolactone 25 mg was held.  She was transfused with 2 units of PRBCs again at the end of September when her hemoglobin dropped down to 7.3.  Iron level was 13.0 with a TIBC of 415 for a saturation of 3.1%. She was scheduled for 2 more doses of IV Feraheme.  She was admitted in October  with a fever of 105 and had E coli sepsis. Lactulose was placed on hold due to severe diarrhea, adding to her dehydration. Her serum ammonia came down to 9, so we did not resume lactulose.  She did have a new splenic infarction as of this admission, which was  causing a lot of left abdominal pain.  Her spironolactone was increased to 50 mg to take 1 to 2 daily.  At her visit in November, she was placed back on lactulose daily.   When she was seen in early February 2022, her CBC reveals a hemoglobin of 7.6 with normal ANC and platelets.  She was therefore transfused 2 units of PRBCs. Iron studies revealed recurrent iron deficiency.. Ammonia level was elevated at 35,  so her lactulose was increased to 30 cc twice daily.  She continued to have blood loss with black stools and one episode of moderate epistaxis,  She saw the gastroenterologist at [facility] on May 27th, Dr. Birdie Hopes, and had an EGD with plasma coagulation to cauterize two AVM's.  He has now recommended observation and will see her back if her hemoglobin drops again, and we document iron deficiency.  She last received IV iron in July/August 2022. She was seen last month, November 2022, for worsening symptoms and was found to have increased ascites. I recommended increase of spironolactone to 50  mg BID. Her hemoglobin was stable and serum ammonia was normal.    INTERVAL HISTORY:  Uniqua is here today for repeat clinical assessment.  She has not required further transfusions in over a year.  However her hemoglobin did drop to 9.9 in March and she was given IV iron in April with good response.  Her hemoglobin is now stable at 11.8 and her platelet count remains stable at 109,000.  Serum ammonia remains normal.  CMP is unremarkable other than a nonfasting blood sugar of 215.  She is doing well except occasional soreness of the abdomen.  She denies fevers or chills. She denies pain. Her appetite is good.  Her weight has increased 3 pounds.  I have reviewed the past medical history, past surgical history, social history and family history with the patient and they are unchanged from previous note.  ALLERGIES:  has No Known Allergies.  MEDICATIONS:  Current Outpatient Medications  Medication Sig Dispense Refill   albuterol (VENTOLIN HFA) 108 (90 Base) MCG/ACT inhaler Inhale into the lungs.     colesevelam (WELCHOL) 625 MG tablet Take 1,875 mg by mouth 2 (two) times daily.     ezetimibe (ZETIA) 10 MG tablet Take 10 mg by mouth daily.     furosemide (LASIX) 40 MG tablet Take 1 tablet by mouth daily.     insulin aspart (NOVOLOG) 100 UNIT/ML FlexPen Inject 30 Units into the skin 2 (two) times daily.     lactulose (CHRONULAC) 10 GM/15ML solution 30 ML THREE TIMES A DAY 2838 mL 5   LEVEMIR FLEXTOUCH 100 UNIT/ML Pen Inject 40 Units into the skin 2 (two) times daily.  12   metFORMIN (GLUCOPHAGE-XR) 500 MG 24 hr tablet Take 500 mg by mouth 2 (two) times daily with a meal.  5   ondansetron (ZOFRAN) 4 MG tablet Take 1 tablet (4 mg total) by mouth every 4 (four) hours as needed for nausea. 90 tablet 3   pregabalin (LYRICA) 75 MG capsule Take 75 mg by mouth at bedtime.  1   promethazine-codeine (PHENERGAN WITH CODEINE) 6.25-10 MG/5ML syrup Take 5 mLs by mouth every 6 (six) hours as needed.      rOPINIRole (REQUIP) 2 MG tablet Take 1 tablet by mouth at bedtime.  3   spironolactone (ALDACTONE) 50 MG tablet Take 1 tablet (50 mg total) by mouth daily. 90 tablet 1   valACYclovir (VALTREX) 1000 MG tablet Take 1,000 mg  by mouth 2 (two) times daily.     Vitamin D, Ergocalciferol, (DRISDOL) 1.25 MG (50000 UT) CAPS capsule Take 1 capsule by mouth once a week.     No current facility-administered medications for this visit.    HISTORY OF PRESENT ILLNESS:   Oncology History  Iron deficiency anemia      REVIEW OF SYSTEMS:   Constitutional: Denies fevers, chills or abnormal weight loss Eyes: Denies blurriness of vision Ears, nose, mouth, throat, and face: Denies mucositis or sore throat Respiratory: Denies cough, dyspnea or wheezes Cardiovascular: Denies palpitation, chest discomfort or lower extremity swelling Gastrointestinal:  Denies nausea, heartburn or change in bowel habits Skin: Denies abnormal skin rashes Lymphatics: Denies new lymphadenopathy or easy bruising Neurological:Denies numbness, tingling or new weaknesses Behavioral/Psych: Mood is stable, no new changes  All other systems were reviewed with the patient and are negative.   VITALS:  Blood pressure (!) 146/64, pulse 73, temperature 97.7 F (36.5 C), temperature source Oral, resp. rate 18, height '5\' 3"'$  (1.6 m), weight 168 lb 1.6 oz (76.2 kg), SpO2 98 %.  Wt Readings from Last 3 Encounters:  01/03/22 168 lb 1.6 oz (76.2 kg)  11/22/21 165 lb 11.2 oz (75.2 kg)  11/09/21 169 lb 4 oz (76.8 kg)    Body mass index is 29.78 kg/m.  Performance status (ECOG): 1 - Symptomatic but completely ambulatory  PHYSICAL EXAM:   GENERAL:alert, no distress and comfortable SKIN: skin color, texture, turgor are normal, no rashes or significant lesions EYES: normal, Conjunctiva are pink and non-injected, sclera clear OROPHARYNX:no exudate, no erythema and lips, buccal mucosa, and tongue normal  NECK: supple, thyroid normal size,  non-tender, without nodularity LYMPH:  no palpable lymphadenopathy in the cervical, axillary or inguinal LUNGS: clear to auscultation and percussion with normal breathing effort HEART: regular rate & rhythm and no murmurs and no lower extremity edema ABDOMEN:abdomen soft, non-tender and normal bowel sounds, with mild to moderate ascites Musculoskeletal:no cyanosis of digits and no clubbing  NEURO: alert & oriented x 3 with fluent speech, no focal motor/sensory deficits  LABORATORY DATA:  I have reviewed the data as listed    Component Value Date/Time   NA 139 01/03/2022 0000   K 4.5 01/03/2022 0000   CL 106 01/03/2022 0000   CO2 24 (A) 01/03/2022 0000   GLUCOSE 143 (H) 04/18/2011 0607   BUN 15 01/03/2022 0000   CREATININE 0.7 01/03/2022 0000   CREATININE <0.47 (L) 04/18/2011 0607   CALCIUM 9.0 01/03/2022 0000   PROT 7.0 04/14/2011 1758   ALBUMIN 4.1 01/03/2022 0000   AST 38 (A) 01/03/2022 0000   ALT 26 01/03/2022 0000   ALKPHOS 81 01/03/2022 0000   BILITOT 1.0 04/14/2011 1758   GFRNONAA 79 03/12/2021 0000   GFRAA NOT CALCULATED 04/18/2011 0607    No results found for: "SPEP", "UPEP"  Lab Results  Component Value Date   WBC 4.8 01/03/2022   NEUTROABS 3.70 01/03/2022   HGB 11.8 (A) 01/03/2022   HCT 37 01/03/2022   MCV 98 11/26/2020   PLT 109 (A) 01/03/2022      Chemistry      Component Value Date/Time   NA 139 01/03/2022 0000   K 4.5 01/03/2022 0000   CL 106 01/03/2022 0000   CO2 24 (A) 01/03/2022 0000   BUN 15 01/03/2022 0000   CREATININE 0.7 01/03/2022 0000   CREATININE <0.47 (L) 04/18/2011 0607   GLU 215 01/03/2022 0000      Component Value Date/Time  CALCIUM 9.0 01/03/2022 0000   ALKPHOS 81 01/03/2022 0000   AST 38 (A) 01/03/2022 0000   ALT 26 01/03/2022 0000   BILITOT 1.0 04/14/2011 1758       RADIOGRAPHIC STUDIES: I have personally reviewed the radiological images as listed and agreed with the findings in the report. No results found.

## 2022-01-18 ENCOUNTER — Other Ambulatory Visit: Payer: Self-pay | Admitting: Hematology and Oncology

## 2022-01-18 MED ORDER — ONDANSETRON HCL 4 MG PO TABS: 4.0000 mg | ORAL_TABLET | ORAL | 3 refills | Status: DC | PRN

## 2022-01-29 ENCOUNTER — Encounter: Payer: Self-pay | Admitting: Oncology

## 2022-01-29 DIAGNOSIS — D689 Coagulation defect, unspecified: Secondary | ICD-10-CM | POA: Insufficient documentation

## 2022-02-09 DIAGNOSIS — G729 Myopathy, unspecified: Secondary | ICD-10-CM | POA: Diagnosis not present

## 2022-02-09 DIAGNOSIS — D509 Iron deficiency anemia, unspecified: Secondary | ICD-10-CM | POA: Diagnosis not present

## 2022-02-09 DIAGNOSIS — Z6827 Body mass index (BMI) 27.0-27.9, adult: Secondary | ICD-10-CM | POA: Diagnosis not present

## 2022-02-09 DIAGNOSIS — E1129 Type 2 diabetes mellitus with other diabetic kidney complication: Secondary | ICD-10-CM | POA: Diagnosis not present

## 2022-02-09 DIAGNOSIS — E559 Vitamin D deficiency, unspecified: Secondary | ICD-10-CM | POA: Diagnosis not present

## 2022-02-09 DIAGNOSIS — K7460 Unspecified cirrhosis of liver: Secondary | ICD-10-CM | POA: Diagnosis not present

## 2022-02-09 DIAGNOSIS — E785 Hyperlipidemia, unspecified: Secondary | ICD-10-CM | POA: Diagnosis not present

## 2022-02-09 DIAGNOSIS — I10 Essential (primary) hypertension: Secondary | ICD-10-CM | POA: Diagnosis not present

## 2022-02-09 DIAGNOSIS — K7581 Nonalcoholic steatohepatitis (NASH): Secondary | ICD-10-CM | POA: Diagnosis not present

## 2022-02-09 DIAGNOSIS — R809 Proteinuria, unspecified: Secondary | ICD-10-CM | POA: Diagnosis not present

## 2022-02-09 DIAGNOSIS — M810 Age-related osteoporosis without current pathological fracture: Secondary | ICD-10-CM | POA: Diagnosis not present

## 2022-02-09 DIAGNOSIS — E039 Hypothyroidism, unspecified: Secondary | ICD-10-CM | POA: Diagnosis not present

## 2022-02-17 DIAGNOSIS — M546 Pain in thoracic spine: Secondary | ICD-10-CM | POA: Diagnosis not present

## 2022-02-17 DIAGNOSIS — M542 Cervicalgia: Secondary | ICD-10-CM | POA: Diagnosis not present

## 2022-02-18 DIAGNOSIS — E785 Hyperlipidemia, unspecified: Secondary | ICD-10-CM | POA: Diagnosis not present

## 2022-02-18 DIAGNOSIS — Z1331 Encounter for screening for depression: Secondary | ICD-10-CM | POA: Diagnosis not present

## 2022-02-18 DIAGNOSIS — Z0000 Encounter for general adult medical examination without abnormal findings: Secondary | ICD-10-CM | POA: Diagnosis not present

## 2022-02-18 DIAGNOSIS — Z9181 History of falling: Secondary | ICD-10-CM | POA: Diagnosis not present

## 2022-02-18 DIAGNOSIS — Z139 Encounter for screening, unspecified: Secondary | ICD-10-CM | POA: Diagnosis not present

## 2022-02-22 ENCOUNTER — Telehealth: Payer: Self-pay | Admitting: *Deleted

## 2022-02-22 NOTE — Patient Outreach (Signed)
  Care Coordination   Initial Visit Note   02/22/2022 Name: Reeda Soohoo MRN: [id number] DOB: [birth year]  Verlia Kaney Dailynn Nancarrow is a 71 y.o. year old female who sees Nicoletta Dress, MD for primary care. I spoke with  Marlane Mingle by phone today  What matters to the patients health and wellness today?  Declines   Goals Addressed   None     SDOH assessments and interventions completed:   No   Care Coordination Interventions Activated:  No Care Coordination Interventions:  No, not indicated  Follow up plan: No further intervention required.  Encounter Outcome:  Pt. Refused  Eduard Clos MSW, LCSW Licensed Clinical Social Worker    [phone]

## 2022-03-03 ENCOUNTER — Other Ambulatory Visit: Payer: Self-pay | Admitting: Oncology

## 2022-03-03 DIAGNOSIS — K7682 Hepatic encephalopathy: Secondary | ICD-10-CM

## 2022-03-03 NOTE — Progress Notes (Signed)
Patient Care Team: Nicoletta Dress, MD as PCP - General (Internal Medicine) Berniece Salines, DO as PCP - Cardiology (Cardiology) Derwood Kaplan, MD as Consulting Physician (Oncology) Maylon Cos, MD as Referring Physician (Gastroenterology)  Clinic Day: 03/04/22  Referring physician: Nicoletta Dress, MD  ASSESSMENT & PLAN:   Assessment & Plan: 1. Iron deficiency anemia, felt to be secondary to chronic GI blood loss from AVM's, as well as intermittent epistaxis. She has required regular IV iron replacement and transfusions.  She saw Dr. Gershon Cull at [facility] and he did cauterize her AVM's.  She finally stabilized and stopped bleeding. She has not required transfusions since the summer of 2022.  However her hemoglobin dropped to 9.9 in March and she was found to be iron deficient.  She was given IV iron in April 2023   2. Non-alcoholic liver cirrhosis, with intermittent mild thrombocytopenia.  Her anemia is also likely partly related to the cirrhosis.   3. Ascites, improved, for which she is on spironolactone and furosemide.   4. Hepatic encephalopathy.  She had a slight increase in her serum ammonia due to partial noncompliance. She now continues recommended dosing of lactulose, and her ammonia remains normal.    5. Multiple AVM's of the small bowel seen on capsule endoscopy in [facility], treated at [facility] in February 2021.   She saw Dr. Gershon Cull in June and two small AVMs were treated with argon plasma coagulation.  Her hemoglobin has been stable and she has not required further transfusions.   6.  Thrombocytopenia, mild, and stable.  We will continue to monitor.   She is complaining of bilateral upper abdominal pain and has not had imaging of her liver in some time, so I will schedule CT of abdomen to follow up on her liver cirrhosis and rule out any liver lesions. Her blood counts have stabilized after receiving IV iron in April.  Dr. Delena Bali will be  seeing her in July and so I can see her back in 2 months with CBC, comprehensive metabolic profile, PT and AFP. The patient understands the plans discussed today and is in agreement with them.  She knows to contact our office if she develops concerns prior to her next appointment.     Derwood Kaplan, MD  [facility] [address] [place] [address] Dept: [phone] Dept Fax: [phone]   Orders Placed This Encounter  Procedures   CBC and differential    This external order was created through the Results Console.   CBC    This external order was created through the Results Console.   Ammonia    This order was created through External Result Entry   CMP ([facility] scanned report) STAT    Standing Status:   Future    Standing Expiration Date:   03/05/2023   CBC w Diff ([facility] scanned report) STAT    Standing Status:   Future    Standing Expiration Date:   03/05/2023   Protime-INR    Standing Status:   Future    Standing Expiration Date:   03/05/2023   AFP tumor marker    Standing Status:   Future    Standing Expiration Date:   7/32/2025   Basic metabolic panel    This external order was created through the Results Console.   Comprehensive metabolic panel    This external order was created through the Results Console.  Hepatic function panel    This external order was created through the Results Console.      CHIEF COMPLAINT:  CC: A 71 year old female with history of iron deficiency anemia here for 6 week evaluation  Current Treatment:  Surveillance   HISTORY OF PRESENT ILLNESS:  Tailyn Hantz is a 71 y.o. female  who we began seeing in December 2019 for iron deficiency anemia. In November 2018, colonoscopy revealed tubular adenomas.  Upper endoscopy in January 2018 did not reveal any specific findings.  She was clearly iron deficient.  She has multiple comorbidities, including  diabetes, liver cirrhosis, COPD, hyperlipidemia, and degenerative disc disease.  Testing for any other deficiency or monoclonal spike was negative. She had already been on oral supplement for nearly 1 year with lack of response, so was given IV iron in the form of Feraheme on several occasions. Repeat colonoscopy and EGD were done in August 2020 by Dr. Noberto Retort.  Biopsy was negative.  When we saw her later in September, the hemoglobin had dropped down to 7.5.  She was transfused with 2 units of packed red blood cells.  Repeat iron studies once again revealed iron deficiency, so was given IV Feraheme.  CT abdomen and pelvis was negative.  She felt so much better after the transfusion that she jumped out of bed and fractured her right foot in a fall.  She was given IV Feraheme again in November.  Capsule endoscopy in November 2020 in [facility] revealed multiple AVM's of the small bowel.  She had cauterization of an AVM of the small bowel at [facility] in February 2021. In March 2021, her hemoglobin dropped back down 7.6.  She therefore received IV Feraheme again in early April.  Abdominal ultrasound in May revealed cirrhosis of the liver with nodular surface, increased echogenicity and heterogeneity.  No focal liver parenchymal lesion was observed.  She has had intermittent mild thrombocytopenia, presumably from her liver cirrhosis. She received IV Feraheme in June 2021, and received 2 units of packed red blood cells at that time. She was admitted to [facility] later in June with generalized weakness, altered mental status, and UTI.  While out of town, she developed severe epistaxis that required packing and 1 unit of packed red blood cells.  CT imaging revealed small amount of blood in the right maxillary and sphenoid sinuses with tube placed in the right paranasal sinus.     She developed a severe epistaxis requiring hospital admission in late July 2021 and had a nasal bullet in place.  Her hemoglobin dropped down  to 6.9, she was transfused, and given a dose of IV Feraheme while in the hospital.  She had an episode of hypotension and fever and was felt to be septic, so was transferred to the ICU.  She was placed on broad-spectrum IV antibiotics, and cultures remained negative.  Echocardiogram showed a suspicious mass in the right atrium, which may be attached to the interatrial septum.  The cardiologist reviewed the echocardiogram and felt that this was likely an artifact and not a mass, vegetation or thrombus. Her hemoglobin was 8.7 at the time of discharge, and she  was seen in August with weakness of her hands,shakiness, dropping objects, fatigue, dyspnea with exertion and moderate abdominal swelling.  She had asterixis, and ammonia level was elevated at 62.  She was therefore placed on lactulose 30 mL twice a day. She was seen a week later with some improvement, but her serum ammonia was  still 54, so the lactulose was increased to TID.  She also had hyperkalemia, so her spironolactone 25 mg was held.  She was transfused with 2 units of PRBCs again at the end of September when her hemoglobin dropped down to 7.3.  Iron level was 13.0 with a TIBC of 415 for a saturation of 3.1%. She was scheduled for 2 more doses of IV Feraheme.  She was admitted in October  with a fever of 105 and had E coli sepsis. Lactulose was placed on hold due to severe diarrhea, adding to her dehydration. Her serum ammonia came down to 9, so we did not resume lactulose.  She did have a new splenic infarction as of this admission, which was causing a lot of left abdominal pain.  Her spironolactone was increased to 50 mg to take 1 to 2 daily.  At her visit in November, she was placed back on lactulose daily.   When she was seen in early February 2022, her CBC reveals a hemoglobin of 7.6 with normal ANC and platelets.  She was therefore transfused 2 units of PRBCs. Iron studies revealed recurrent iron deficiency.. Ammonia level was elevated at 35,  so  her lactulose was increased to 30 cc twice daily.  She continued to have blood loss with black stools and one episode of moderate epistaxis,  She saw the gastroenterologist at [facility] on May 27th, Dr. Birdie Hopes, and had an EGD with plasma coagulation to cauterize two AVM's.  He has now recommended observation and will see her back if her hemoglobin drops again, and we document iron deficiency.  She last received IV iron in July/August 2022. She was seen last month, November 2022, for worsening symptoms and was found to have increased ascites. I recommended increase of spironolactone to 50 mg BID. Her hemoglobin was stable and serum ammonia was normal.    INTERVAL HISTORY:  Kenedie is here today for repeat clinical assessment.  She has not required further transfusions in over a year.  However her hemoglobin did drop to 9.9 in March and she was given IV iron in April with good response.  Her hemoglobin is now stable at 11.8 and her platelet count remains stable at 124,000.  Serum ammonia remains normal.  CMP is unremarkable other than a nonfasting blood sugar of 193.  She is doing well except occasional soreness of the upper abdomen bilaterally. Her significant other has been hospitalized and so it has been stressful and she is not getting rest. She denies fevers or chills. She denies pain. Her appetite is good.  Her weight has increased 1 pound.  I have reviewed the past medical history, past surgical history, social history and family history with the patient and they are unchanged from previous note.  ALLERGIES:  has No Known Allergies.  MEDICATIONS:  Current Outpatient Medications  Medication Sig Dispense Refill   albuterol (VENTOLIN HFA) 108 (90 Base) MCG/ACT inhaler Inhale into the lungs.     colesevelam (WELCHOL) 625 MG tablet Take 1,875 mg by mouth 2 (two) times daily.     ezetimibe (ZETIA) 10 MG tablet Take 10 mg by mouth daily.     furosemide (LASIX) 40 MG tablet Take 1 tablet by mouth daily.      insulin aspart (NOVOLOG) 100 UNIT/ML FlexPen Inject 30 Units into the skin 2 (two) times daily.     lactulose (CHRONULAC) 10 GM/15ML solution 30 ML THREE TIMES A DAY 2838 mL 5   LEVEMIR FLEXTOUCH 100 UNIT/ML Pen  Inject 40 Units into the skin 2 (two) times daily.  12   levothyroxine (SYNTHROID) 50 MCG tablet Take 50 mcg by mouth daily.     metFORMIN (GLUCOPHAGE-XR) 500 MG 24 hr tablet Take 500 mg by mouth 2 (two) times daily with a meal.  5   ondansetron (ZOFRAN) 4 MG tablet Take 1 tablet (4 mg total) by mouth every 4 (four) hours as needed for nausea. 90 tablet 3   pregabalin (LYRICA) 75 MG capsule Take 75 mg by mouth at bedtime.  1   promethazine-codeine (PHENERGAN WITH CODEINE) 6.25-10 MG/5ML syrup Take 5 mLs by mouth every 6 (six) hours as needed.     rOPINIRole (REQUIP) 2 MG tablet Take 1 tablet by mouth at bedtime.  3   spironolactone (ALDACTONE) 50 MG tablet Take 1 tablet (50 mg total) by mouth daily. 90 tablet 1   valACYclovir (VALTREX) 1000 MG tablet Take 1,000 mg by mouth 2 (two) times daily.     Vitamin D, Ergocalciferol, (DRISDOL) 1.25 MG (50000 UT) CAPS capsule Take 1 capsule by mouth once a week.     No current facility-administered medications for this visit.    HISTORY OF PRESENT ILLNESS:   Oncology History  Iron deficiency anemia      REVIEW OF SYSTEMS:   Constitutional: Denies fevers, chills or abnormal weight loss Eyes: Denies blurriness of vision Ears, nose, mouth, throat, and face: Denies mucositis or sore throat Respiratory: Denies cough, dyspnea or wheezes Cardiovascular: Denies palpitation, chest discomfort or lower extremity swelling Gastrointestinal:  Denies nausea, heartburn or change in bowel habits Skin: Denies abnormal skin rashes Lymphatics: Denies new lymphadenopathy or easy bruising Neurological:Denies numbness, tingling or new weaknesses Behavioral/Psych: Mood is stable, no new changes  All other systems were reviewed with the patient and are  negative.   VITALS:  Blood pressure (!) 160/69, pulse 79, temperature 97.8 F (36.6 C), temperature source Oral, resp. rate 18, height 5' 3"  (1.6 m), weight 168 lb 14.4 oz (76.6 kg), SpO2 96 %.  Wt Readings from Last 3 Encounters:  03/04/22 168 lb 14.4 oz (76.6 kg)  01/03/22 168 lb 1.6 oz (76.2 kg)  11/22/21 165 lb 11.2 oz (75.2 kg)    Body mass index is 29.92 kg/m.  Performance status (ECOG): 1 - Symptomatic but completely ambulatory  PHYSICAL EXAM:   GENERAL:alert, no distress and comfortable SKIN: skin color, texture, turgor are normal, no rashes or significant lesions EYES: normal, Conjunctiva are pink and non-injected, sclera clear OROPHARYNX:no exudate, no erythema and lips, buccal mucosa, and tongue normal  NECK: supple, thyroid normal size, non-tender, without nodularity LYMPH:  no palpable lymphadenopathy in the cervical, axillary or inguinal LUNGS: clear to auscultation and percussion with normal breathing effort HEART: regular rate & rhythm and no murmurs and no lower extremity edema ABDOMEN:abdomen soft, non-tender and normal bowel sounds, with mild to moderate ascites Musculoskeletal:no cyanosis of digits and no clubbing  NEURO: alert & oriented x 3 with fluent speech, no focal motor/sensory deficits  LABORATORY DATA:  I have reviewed the data as listed    Component Value Date/Time   NA 137 03/04/2022 0000   K 4.1 03/04/2022 0000   CL 107 03/04/2022 0000   CO2 25 (A) 03/04/2022 0000   GLUCOSE 143 (H) 04/18/2011 0607   BUN 16 03/04/2022 0000   CREATININE 0.8 03/04/2022 0000   CREATININE <0.47 (L) 04/18/2011 0607   CALCIUM 9.3 03/04/2022 0000   PROT 7.0 04/14/2011 1758   ALBUMIN 3.8 03/04/2022 0000  AST 30 03/04/2022 0000   ALT 22 03/04/2022 0000   ALKPHOS 63 03/04/2022 0000   BILITOT 1.0 04/14/2011 1758   GFRNONAA 79 03/12/2021 0000   GFRAA NOT CALCULATED 04/18/2011 0607    No results found for: "SPEP", "UPEP"  Lab Results  Component Value Date    WBC 5.8 03/04/2022   NEUTROABS 3.60 03/04/2022   HGB 11.8 (A) 03/04/2022   HCT 35 (A) 03/04/2022   MCV 98 11/26/2020   PLT 124 (A) 03/04/2022      Chemistry      Component Value Date/Time   NA 137 03/04/2022 0000   K 4.1 03/04/2022 0000   CL 107 03/04/2022 0000   CO2 25 (A) 03/04/2022 0000   BUN 16 03/04/2022 0000   CREATININE 0.8 03/04/2022 0000   CREATININE <0.47 (L) 04/18/2011 0607   GLU 193 03/04/2022 0000      Component Value Date/Time   CALCIUM 9.3 03/04/2022 0000   ALKPHOS 63 03/04/2022 0000   AST 30 03/04/2022 0000   ALT 22 03/04/2022 0000   BILITOT 1.0 04/14/2011 1758       RADIOGRAPHIC STUDIES: No results found.

## 2022-03-04 ENCOUNTER — Other Ambulatory Visit: Payer: Self-pay | Admitting: Oncology

## 2022-03-04 ENCOUNTER — Inpatient Hospital Stay: Payer: Medicare Other | Attending: Oncology | Admitting: Oncology

## 2022-03-04 ENCOUNTER — Encounter: Payer: Self-pay | Admitting: Oncology

## 2022-03-04 ENCOUNTER — Inpatient Hospital Stay: Payer: Medicare Other

## 2022-03-04 VITALS — BP 160/69 | HR 79 | Temp 97.8°F | Resp 18 | Ht 63.0 in | Wt 168.9 lb

## 2022-03-04 DIAGNOSIS — D649 Anemia, unspecified: Secondary | ICD-10-CM | POA: Diagnosis not present

## 2022-03-04 DIAGNOSIS — D500 Iron deficiency anemia secondary to blood loss (chronic): Secondary | ICD-10-CM

## 2022-03-04 DIAGNOSIS — K7581 Nonalcoholic steatohepatitis (NASH): Secondary | ICD-10-CM

## 2022-03-04 DIAGNOSIS — K7460 Unspecified cirrhosis of liver: Secondary | ICD-10-CM

## 2022-03-04 DIAGNOSIS — K7682 Hepatic encephalopathy: Secondary | ICD-10-CM

## 2022-03-04 DIAGNOSIS — D696 Thrombocytopenia, unspecified: Secondary | ICD-10-CM

## 2022-03-04 DIAGNOSIS — K7469 Other cirrhosis of liver: Secondary | ICD-10-CM

## 2022-03-04 LAB — COMPREHENSIVE METABOLIC PANEL
Albumin: 3.8 (ref 3.5–5.0)
Calcium: 9.3 (ref 8.7–10.7)

## 2022-03-04 LAB — BASIC METABOLIC PANEL
BUN: 16 (ref 4–21)
CO2: 25 — AB (ref 13–22)
Chloride: 107 (ref 99–108)
Creatinine: 0.8 (ref 0.5–1.1)
Glucose: 193
Potassium: 4.1 mEq/L (ref 3.5–5.1)
Sodium: 137 (ref 137–147)

## 2022-03-04 LAB — HEPATIC FUNCTION PANEL
ALT: 22 U/L (ref 7–35)
AST: 30 (ref 13–35)
Alkaline Phosphatase: 63 (ref 25–125)
Bilirubin, Total: 0.9

## 2022-03-04 LAB — CBC AND DIFFERENTIAL
HCT: 35 — AB (ref 36–46)
Hemoglobin: 11.8 — AB (ref 12.0–16.0)
Neutrophils Absolute: 3.6
Platelets: 124 10*3/uL — AB (ref 150–400)
WBC: 5.8

## 2022-03-04 LAB — AMMONIA: Ammonia: 15

## 2022-03-04 LAB — CBC: RBC: 3.46 — AB (ref 3.87–5.11)

## 2022-03-08 DIAGNOSIS — L03011 Cellulitis of right finger: Secondary | ICD-10-CM | POA: Diagnosis not present

## 2022-03-11 DIAGNOSIS — I700 Atherosclerosis of aorta: Secondary | ICD-10-CM | POA: Diagnosis not present

## 2022-03-11 DIAGNOSIS — K314 Gastric diverticulum: Secondary | ICD-10-CM | POA: Diagnosis not present

## 2022-03-11 DIAGNOSIS — K7460 Unspecified cirrhosis of liver: Secondary | ICD-10-CM | POA: Diagnosis not present

## 2022-03-11 DIAGNOSIS — K7581 Nonalcoholic steatohepatitis (NASH): Secondary | ICD-10-CM | POA: Diagnosis not present

## 2022-03-11 DIAGNOSIS — I868 Varicose veins of other specified sites: Secondary | ICD-10-CM | POA: Diagnosis not present

## 2022-03-11 DIAGNOSIS — I2510 Atherosclerotic heart disease of native coronary artery without angina pectoris: Secondary | ICD-10-CM | POA: Diagnosis not present

## 2022-03-14 ENCOUNTER — Telehealth: Payer: Self-pay

## 2022-03-14 ENCOUNTER — Encounter: Payer: Self-pay | Admitting: Oncology

## 2022-03-14 NOTE — Telephone Encounter (Signed)
Patient notified of CT abdomen results. Per Dr. Hinton Rao "Tell patient scan is clear-liver cirrhosis as we already know, no problems."

## 2022-03-29 ENCOUNTER — Encounter: Payer: Self-pay | Admitting: Oncology

## 2022-04-01 DIAGNOSIS — M545 Low back pain, unspecified: Secondary | ICD-10-CM | POA: Diagnosis not present

## 2022-04-01 DIAGNOSIS — M47816 Spondylosis without myelopathy or radiculopathy, lumbar region: Secondary | ICD-10-CM | POA: Diagnosis not present

## 2022-04-01 DIAGNOSIS — M533 Sacrococcygeal disorders, not elsewhere classified: Secondary | ICD-10-CM | POA: Diagnosis not present

## 2022-04-01 DIAGNOSIS — M47814 Spondylosis without myelopathy or radiculopathy, thoracic region: Secondary | ICD-10-CM | POA: Diagnosis not present

## 2022-04-01 DIAGNOSIS — M542 Cervicalgia: Secondary | ICD-10-CM | POA: Diagnosis not present

## 2022-04-01 DIAGNOSIS — M546 Pain in thoracic spine: Secondary | ICD-10-CM | POA: Diagnosis not present

## 2022-04-11 DIAGNOSIS — D509 Iron deficiency anemia, unspecified: Secondary | ICD-10-CM | POA: Diagnosis not present

## 2022-04-19 NOTE — Progress Notes (Signed)
Patient Care Team: Nicoletta Dress, MD as PCP - General (Internal Medicine) Berniece Salines, DO as PCP - Cardiology (Cardiology) Derwood Kaplan, MD as Consulting Physician (Oncology) Maylon Cos, MD as Referring Physician (Gastroenterology)  Clinic Day: 04/21/2022  Referring physician: Nicoletta Dress, MD  ASSESSMENT & PLAN:   Assessment & Plan: 1. Iron deficiency anemia, felt to be secondary to chronic GI blood loss from AVM's, as well as intermittent epistaxis. She has required regular IV iron replacement and transfusions.  She saw Dr. Gershon Cull at [facility] and he did cauterize her AVM's.  She finally stabilized and stopped bleeding. She has not required transfusions since the summer of 2022.  However her hemoglobin dropped to 9.9 in March and she was found to be iron deficient.  She was given IV iron in April 2023, and has not required any since then.   2. Non-alcoholic liver cirrhosis, with intermittent mild thrombocytopenia.  Her anemia is also likely partly related to the cirrhosis.   3. Ascites, improved and stable, for which she is on spironolactone and furosemide.   4. Hepatic encephalopathy.  I'm concerned she may have an increase in her serum ammonia due to partial noncompliance so I will check a serum ammonia.    5. Multiple AVM's of the small bowel seen on capsule endoscopy in [place], treated at [facility] in February 2021. I don't think she is bleeding any longer.  She saw Dr. Gershon Cull in June and two small AVMs were treated with argon plasma coagulation.  Her hemoglobin has been stable and she has not required further transfusions.   6.  Thrombocytopenia, mild, and stable.  We will continue to monitor.  Her blood counts have stabilized after receiving IV iron in April. She sees Dr.Shultz sometime in November, she also will have her colonoscopy in October. I can see her back in 3-4 months with CBC, comprehensive metabolic profile, and serum  ammonia. I drew an AFP and it is pending. I will also check her urine for infection as this could be the cause of her symptoms as well. She says she is sleeping all the time. The patient understands the plans discussed today and is in agreement with them.  She knows to contact our office if she develops concerns prior to her next appointment.    Derwood Kaplan, MD  [place] [address] [place] [address] Dept: [phone] Dept Fax: [phone]   Orders Placed This Encounter  Procedures   Basic metabolic panel    This external order was created through the Results Console.   Comprehensive metabolic panel    This external order was created through the Results Console.   CBC and differential    This external order was created through the Results Console.   CBC    This external order was created through the Results Console.   Hepatic function panel    This external order was created through the Results Console.      CHIEF COMPLAINT:  CC: A 71 year old female with history of iron deficiency anemia here for 6 week evaluation  Current Treatment:  Surveillance   HISTORY OF PRESENT ILLNESS:  [name] is a 71 y.o. female  who we began seeing in December 2019 for iron deficiency anemia. In November 2018, colonoscopy revealed tubular adenomas.  Upper endoscopy in January 2018 did not reveal any specific findings.  She was clearly iron  deficient.  She has multiple comorbidities, including diabetes, liver cirrhosis, COPD, hyperlipidemia, and degenerative disc disease.  Testing for any other deficiency or monoclonal spike was negative. She had already been on oral supplement for nearly 1 year with lack of response, so was given IV iron in the form of Feraheme on several occasions. Repeat colonoscopy and EGD were done in August 2020 by Dr. Noberto Retort.  Biopsy was negative.  When we saw her later in September,  the hemoglobin had dropped down to 7.5.  She was transfused with 2 units of packed red blood cells.  Repeat iron studies once again revealed iron deficiency, so was given IV Feraheme.  CT abdomen and pelvis was negative.  She felt so much better after the transfusion that she jumped out of bed and fractured her right foot in a fall.  She was given IV Feraheme again in November.  Capsule endoscopy in November 2020 in [facility] revealed multiple AVM's of the small bowel.  She had cauterization of an AVM of the small bowel at [facility] in February 2021. In March 2021, her hemoglobin dropped back down 7.6.  She therefore received IV Feraheme again in early April.  Abdominal ultrasound in May revealed cirrhosis of the liver with nodular surface, increased echogenicity and heterogeneity.  No focal liver parenchymal lesion was observed.  She has had intermittent mild thrombocytopenia, presumably from her liver cirrhosis. She received IV Feraheme in June 2021, and received 2 units of packed red blood cells at that time. She was admitted to [facility] later in June with generalized weakness, altered mental status, and UTI.  While out of town, she developed severe epistaxis that required packing and 1 unit of packed red blood cells.  CT imaging revealed small amount of blood in the right maxillary and sphenoid sinuses with tube placed in the right paranasal sinus.     She developed a severe epistaxis requiring hospital admission in late July 2021 and had a nasal bullet in place.  Her hemoglobin dropped down to 6.9, she was transfused, and given a dose of IV Feraheme while in the hospital.  She had an episode of hypotension and fever and was felt to be septic, so was transferred to the ICU.  She was placed on broad-spectrum IV antibiotics, and cultures remained negative.  Echocardiogram showed a suspicious mass in the right atrium, which may be attached to the interatrial septum.  The cardiologist reviewed the  echocardiogram and felt that this was likely an artifact and not a mass, vegetation or thrombus. Her hemoglobin was 8.7 at the time of discharge, and she  was seen in August with weakness of her hands,shakiness, dropping objects, fatigue, dyspnea with exertion and moderate abdominal swelling.  She had asterixis, and ammonia level was elevated at 62.  She was therefore placed on lactulose 30 mL twice a day. She was seen a week later with some improvement, but her serum ammonia was still 54, so the lactulose was increased to TID.  She also had hyperkalemia, so her spironolactone 25 mg was held.  She was transfused with 2 units of PRBCs again at the end of September when her hemoglobin dropped down to 7.3.  Iron level was 13.0 with a TIBC of 415 for a saturation of 3.1%. She was scheduled for 2 more doses of IV Feraheme.  She was admitted in October  with a fever of 105 and had E coli sepsis. Lactulose was placed on hold due to severe diarrhea, adding to  her dehydration. Her serum ammonia came down to 9, so we did not resume lactulose.  She did have a new splenic infarction as of this admission, which was causing a lot of left abdominal pain.  Her spironolactone was increased to 50 mg to take 1 to 2 daily.  At her visit in November, she was placed back on lactulose daily.   When she was seen in early February 2022, her CBC reveals a hemoglobin of 7.6 with normal ANC and platelets.  She was therefore transfused 2 units of PRBCs. Iron studies revealed recurrent iron deficiency.. Ammonia level was elevated at 35,  so her lactulose was increased to 30 cc twice daily.  She continued to have blood loss with black stools and one episode of moderate epistaxis,  She saw the gastroenterologist at [facility] on May 27th, Dr. Birdie Hopes, and had an EGD with plasma coagulation to cauterize two AVM's.  He has now recommended observation and will see her back if her hemoglobin drops again, and we document iron deficiency.  She last  received IV iron in July/August 2022. She was seen last month, November 2022, for worsening symptoms and was found to have increased ascites. I recommended increase of spironolactone to 50 mg BID. Her hemoglobin was stable and serum ammonia was normal.    INTERVAL HISTORY:  Yalissa is here today for repeat clinical assessment.  She states she has been falling asleep and problems in her abdomen described as crawling sensation and growth. She had IV iron around 6 months ago. She has been taking the lactulose but did not take it the last 2 nights. Her hemoglobin is at 12.9, the best it has been in years. Her glucose is 108 otherwise CMP is normal. I will draw serum ammonia to make sure she does not have hepatic encephalopathy as the cause of her symptoms. She also describes foul smelling urine and so we will check urin analysis and urine culture. She will have colonoscopy in October. She denies fevers or chills. She denies pain. Her appetite is good.  Her weight is down 1 pound.   I have reviewed the past medical history, past surgical history, social history and family history with the patient and they are unchanged from previous note.  ALLERGIES:  has No Known Allergies.  MEDICATIONS:  Current Outpatient Medications  Medication Sig Dispense Refill   albuterol (VENTOLIN HFA) 108 (90 Base) MCG/ACT inhaler Inhale into the lungs.     colesevelam (WELCHOL) 625 MG tablet Take 1,875 mg by mouth 2 (two) times daily.     ezetimibe (ZETIA) 10 MG tablet Take 10 mg by mouth daily.     furosemide (LASIX) 40 MG tablet Take 1 tablet by mouth daily.     insulin aspart (NOVOLOG) 100 UNIT/ML FlexPen Inject 30 Units into the skin 2 (two) times daily.     lactulose (CHRONULAC) 10 GM/15ML solution 30 ML THREE TIMES A DAY 2838 mL 5   LEVEMIR FLEXTOUCH 100 UNIT/ML Pen Inject 40 Units into the skin 2 (two) times daily.  12   levothyroxine (SYNTHROID) 50 MCG tablet Take 50 mcg by mouth daily.     metFORMIN (GLUCOPHAGE-XR)  500 MG 24 hr tablet Take 500 mg by mouth 2 (two) times daily with a meal.  5   ondansetron (ZOFRAN) 4 MG tablet Take 1 tablet (4 mg total) by mouth every 4 (four) hours as needed for nausea. 90 tablet 3   pregabalin (LYRICA) 75 MG capsule Take 75 mg by  mouth at bedtime.  1   promethazine-codeine (PHENERGAN WITH CODEINE) 6.25-10 MG/5ML syrup Take 5 mLs by mouth every 6 (six) hours as needed.     rOPINIRole (REQUIP) 2 MG tablet Take 1 tablet by mouth at bedtime.  3   spironolactone (ALDACTONE) 50 MG tablet Take 1 tablet (50 mg total) by mouth daily. 90 tablet 1   valACYclovir (VALTREX) 1000 MG tablet Take 1,000 mg by mouth 2 (two) times daily.     Vitamin D, Ergocalciferol, (DRISDOL) 1.25 MG (50000 UT) CAPS capsule Take 1 capsule by mouth once a week.     No current facility-administered medications for this visit.    HISTORY OF PRESENT ILLNESS:   Oncology History  Iron deficiency anemia      REVIEW OF SYSTEMS:   Constitutional: Denies fevers, chills or abnormal weight loss Eyes: Denies blurriness of vision Ears, nose, mouth, throat, and face: Denies mucositis or sore throat Respiratory: Denies cough, dyspnea or wheezes Cardiovascular: Denies palpitation, chest discomfort or lower extremity swelling Gastrointestinal:  Denies nausea, heartburn or change in bowel habits Skin: Denies abnormal skin rashes Lymphatics: Denies new lymphadenopathy or easy bruising Neurological:Denies numbness, tingling or new weaknesses Behavioral/Psych: Mood is stable, no new changes  All other systems were reviewed with the patient and are negative.   VITALS:  Blood pressure (!) 155/78, pulse 82, temperature 98.2 F (36.8 C), temperature source Oral, resp. rate 18, height 5' 3"  (1.6 m), weight 167 lb 11.2 oz (76.1 kg), SpO2 99 %.  Wt Readings from Last 3 Encounters:  04/21/22 167 lb 11.2 oz (76.1 kg)  03/04/22 168 lb 14.4 oz (76.6 kg)  01/03/22 168 lb 1.6 oz (76.2 kg)    Body mass index is 29.71  kg/m.  Performance status (ECOG): 1 - Symptomatic but completely ambulatory  PHYSICAL EXAM:   GENERAL:alert, no distress and comfortable SKIN: skin color, texture, turgor are normal, no rashes or significant lesions EYES: normal, Conjunctiva are pink and non-injected, sclera clear OROPHARYNX:no exudate, no erythema and lips, buccal mucosa, and tongue normal  NECK: supple, thyroid normal size, non-tender, without nodularity LYMPH:  no palpable lymphadenopathy in the cervical, axillary or inguinal LUNGS: clear to auscultation and percussion with normal breathing effort HEART: regular rate & rhythm and no murmurs and no lower extremity edema ABDOMEN:abdomen soft, non-tender and normal bowel sounds, with mild to moderate ascites Musculoskeletal:no cyanosis of digits and no clubbing  NEURO: alert & oriented x 3 with fluent speech, no focal motor/sensory deficits    LABORATORY DATA:  I have reviewed the data as listed    Component Value Date/Time   NA 140 04/21/2022 0000   K 3.4 (A) 04/21/2022 0000   CL 105 04/21/2022 0000   CO2 26 (A) 04/21/2022 0000   GLUCOSE 143 (H) 04/18/2011 0607   BUN 18 04/21/2022 0000   CREATININE 0.9 04/21/2022 0000   CREATININE <0.47 (L) 04/18/2011 0607   CALCIUM 10.1 04/21/2022 0000   PROT 7.0 04/14/2011 1758   ALBUMIN 4.3 04/21/2022 0000   AST 33 04/21/2022 0000   ALT 23 04/21/2022 0000   ALKPHOS 85 04/21/2022 0000   BILITOT 1.0 04/14/2011 1758   GFRNONAA 79 03/12/2021 0000   GFRAA NOT CALCULATED 04/18/2011 0607    No results found for: "SPEP", "UPEP"  Lab Results  Component Value Date   WBC 5.7 04/21/2022   NEUTROABS 3.48 04/21/2022   HGB 12.9 04/21/2022   HCT 38 04/21/2022   MCV 98 11/26/2020   PLT 129 (A) 04/21/2022  Chemistry      Component Value Date/Time   NA 140 04/21/2022 0000   K 3.4 (A) 04/21/2022 0000   CL 105 04/21/2022 0000   CO2 26 (A) 04/21/2022 0000   BUN 18 04/21/2022 0000   CREATININE 0.9 04/21/2022 0000    CREATININE <0.47 (L) 04/18/2011 0607   GLU 108 04/21/2022 0000      Component Value Date/Time   CALCIUM 10.1 04/21/2022 0000   ALKPHOS 85 04/21/2022 0000   AST 33 04/21/2022 0000   ALT 23 04/21/2022 0000   BILITOT 1.0 04/14/2011 1758       RADIOGRAPHIC STUDIES: No results found. EXAM:03/11/22 CT ABDOMEN WITH CONTRAST IMPRESSION: Cirrhosis. No suspicious liver lesion or contrast enhancement. Abdominal varices, including recanalization of the umbilical vein and caput carices as well as splenorenal varices in the left upper quadrant. Coronary artery disease.   I,Gabriella Ballesteros,acting as a scribe for Derwood Kaplan, MD.,have documented all relevant documentation on the behalf of Derwood Kaplan, MD,as directed by  Derwood Kaplan, MD while in the presence of Derwood Kaplan, MD.

## 2022-04-21 ENCOUNTER — Encounter: Payer: Self-pay | Admitting: Oncology

## 2022-04-21 ENCOUNTER — Inpatient Hospital Stay: Payer: Medicare Other | Attending: Oncology | Admitting: Oncology

## 2022-04-21 ENCOUNTER — Inpatient Hospital Stay: Payer: Medicare Other

## 2022-04-21 ENCOUNTER — Other Ambulatory Visit: Payer: Self-pay | Admitting: Oncology

## 2022-04-21 ENCOUNTER — Telehealth: Payer: Self-pay | Admitting: Oncology

## 2022-04-21 VITALS — BP 155/78 | HR 82 | Temp 98.2°F | Resp 18 | Ht 63.0 in | Wt 167.7 lb

## 2022-04-21 DIAGNOSIS — D696 Thrombocytopenia, unspecified: Secondary | ICD-10-CM | POA: Insufficient documentation

## 2022-04-21 DIAGNOSIS — K7682 Hepatic encephalopathy: Secondary | ICD-10-CM | POA: Diagnosis not present

## 2022-04-21 DIAGNOSIS — R1010 Upper abdominal pain, unspecified: Secondary | ICD-10-CM | POA: Insufficient documentation

## 2022-04-21 DIAGNOSIS — K7460 Unspecified cirrhosis of liver: Secondary | ICD-10-CM

## 2022-04-21 DIAGNOSIS — R8290 Unspecified abnormal findings in urine: Secondary | ICD-10-CM

## 2022-04-21 DIAGNOSIS — D649 Anemia, unspecified: Secondary | ICD-10-CM | POA: Diagnosis not present

## 2022-04-21 DIAGNOSIS — K7581 Nonalcoholic steatohepatitis (NASH): Secondary | ICD-10-CM | POA: Diagnosis not present

## 2022-04-21 DIAGNOSIS — R188 Other ascites: Secondary | ICD-10-CM | POA: Diagnosis not present

## 2022-04-21 DIAGNOSIS — K5520 Angiodysplasia of colon without hemorrhage: Secondary | ICD-10-CM | POA: Insufficient documentation

## 2022-04-21 DIAGNOSIS — D500 Iron deficiency anemia secondary to blood loss (chronic): Secondary | ICD-10-CM | POA: Diagnosis not present

## 2022-04-21 LAB — CBC AND DIFFERENTIAL
HCT: 38 (ref 36–46)
Hemoglobin: 12.9 (ref 12.0–16.0)
Neutrophils Absolute: 3.48
Platelets: 129 10*3/uL — AB (ref 150–400)
WBC: 5.7

## 2022-04-21 LAB — BASIC METABOLIC PANEL
BUN: 18 (ref 4–21)
CO2: 26 — AB (ref 13–22)
Chloride: 105 (ref 99–108)
Creatinine: 0.9 (ref <=1.1)
Glucose: 108
Potassium: 3.4 mEq/L — AB (ref 3.5–5.1)
Sodium: 140 (ref 137–147)

## 2022-04-21 LAB — HEPATIC FUNCTION PANEL
ALT: 23 U/L (ref 7–35)
AST: 33 (ref 13–35)
Alkaline Phosphatase: 85 (ref 25–125)

## 2022-04-21 LAB — URINALYSIS, COMPLETE (UACMP) WITH MICROSCOPIC
Bilirubin Urine: NEGATIVE
Glucose, UA: >=500 mg/dL — AB
Hgb urine dipstick: NEGATIVE
Ketones, ur: NEGATIVE mg/dL
Leukocytes,Ua: NEGATIVE
Nitrite: NEGATIVE
Protein, ur: NEGATIVE mg/dL
Specific Gravity, Urine: 1.02 (ref 1.005–1.030)
pH: 5 (ref 5.0–8.0)

## 2022-04-21 LAB — COMPREHENSIVE METABOLIC PANEL
Albumin: 4.3 (ref 3.5–5.0)
Calcium: 10.1 (ref 8.7–10.7)

## 2022-04-21 LAB — CBC: RBC: 3.81 — AB (ref 3.87–5.11)

## 2022-04-21 LAB — PROTIME-INR
INR: 1.2 (ref 0.8–1.2)
Prothrombin Time: 14.7 seconds (ref 11.4–15.2)

## 2022-04-21 NOTE — Telephone Encounter (Signed)
04/22/22 Next appt scheduled and confirmed with patient

## 2022-04-22 LAB — URINE CULTURE: Culture: <10000 — AB

## 2022-04-23 LAB — AFP TUMOR MARKER: AFP, Serum, Tumor Marker: 4.7 ng/mL (ref 0.0–9.2)

## 2022-05-05 DIAGNOSIS — Z794 Long term (current) use of insulin: Secondary | ICD-10-CM | POA: Diagnosis not present

## 2022-05-05 DIAGNOSIS — E119 Type 2 diabetes mellitus without complications: Secondary | ICD-10-CM | POA: Diagnosis not present

## 2022-05-05 DIAGNOSIS — D509 Iron deficiency anemia, unspecified: Secondary | ICD-10-CM | POA: Diagnosis not present

## 2022-05-05 DIAGNOSIS — Z79899 Other long term (current) drug therapy: Secondary | ICD-10-CM | POA: Diagnosis not present

## 2022-05-10 DIAGNOSIS — L821 Other seborrheic keratosis: Secondary | ICD-10-CM | POA: Diagnosis not present

## 2022-05-10 DIAGNOSIS — D2239 Melanocytic nevi of other parts of face: Secondary | ICD-10-CM | POA: Diagnosis not present

## 2022-05-10 DIAGNOSIS — D485 Neoplasm of uncertain behavior of skin: Secondary | ICD-10-CM | POA: Diagnosis not present

## 2022-05-10 DIAGNOSIS — L578 Other skin changes due to chronic exposure to nonionizing radiation: Secondary | ICD-10-CM | POA: Diagnosis not present

## 2022-05-10 DIAGNOSIS — L570 Actinic keratosis: Secondary | ICD-10-CM | POA: Diagnosis not present

## 2022-05-10 DIAGNOSIS — L820 Inflamed seborrheic keratosis: Secondary | ICD-10-CM | POA: Diagnosis not present

## 2022-05-16 DIAGNOSIS — E559 Vitamin D deficiency, unspecified: Secondary | ICD-10-CM | POA: Diagnosis not present

## 2022-05-16 DIAGNOSIS — Z23 Encounter for immunization: Secondary | ICD-10-CM | POA: Diagnosis not present

## 2022-05-16 DIAGNOSIS — E785 Hyperlipidemia, unspecified: Secondary | ICD-10-CM | POA: Diagnosis not present

## 2022-05-16 DIAGNOSIS — D509 Iron deficiency anemia, unspecified: Secondary | ICD-10-CM | POA: Diagnosis not present

## 2022-05-16 DIAGNOSIS — E1129 Type 2 diabetes mellitus with other diabetic kidney complication: Secondary | ICD-10-CM | POA: Diagnosis not present

## 2022-05-16 DIAGNOSIS — E039 Hypothyroidism, unspecified: Secondary | ICD-10-CM | POA: Diagnosis not present

## 2022-05-16 DIAGNOSIS — M810 Age-related osteoporosis without current pathological fracture: Secondary | ICD-10-CM | POA: Diagnosis not present

## 2022-05-16 DIAGNOSIS — K7581 Nonalcoholic steatohepatitis (NASH): Secondary | ICD-10-CM | POA: Diagnosis not present

## 2022-05-16 DIAGNOSIS — K7460 Unspecified cirrhosis of liver: Secondary | ICD-10-CM | POA: Diagnosis not present

## 2022-05-16 DIAGNOSIS — G729 Myopathy, unspecified: Secondary | ICD-10-CM | POA: Diagnosis not present

## 2022-05-16 DIAGNOSIS — I10 Essential (primary) hypertension: Secondary | ICD-10-CM | POA: Diagnosis not present

## 2022-05-16 DIAGNOSIS — R809 Proteinuria, unspecified: Secondary | ICD-10-CM | POA: Diagnosis not present

## 2022-05-18 ENCOUNTER — Encounter: Payer: Self-pay | Admitting: Oncology

## 2022-05-26 DIAGNOSIS — E785 Hyperlipidemia, unspecified: Secondary | ICD-10-CM | POA: Diagnosis not present

## 2022-05-26 DIAGNOSIS — E039 Hypothyroidism, unspecified: Secondary | ICD-10-CM | POA: Diagnosis not present

## 2022-05-26 DIAGNOSIS — I10 Essential (primary) hypertension: Secondary | ICD-10-CM | POA: Diagnosis not present

## 2022-05-26 DIAGNOSIS — E1129 Type 2 diabetes mellitus with other diabetic kidney complication: Secondary | ICD-10-CM | POA: Diagnosis not present

## 2022-05-26 DIAGNOSIS — K7581 Nonalcoholic steatohepatitis (NASH): Secondary | ICD-10-CM | POA: Diagnosis not present

## 2022-05-26 DIAGNOSIS — R809 Proteinuria, unspecified: Secondary | ICD-10-CM | POA: Diagnosis not present

## 2022-06-03 DIAGNOSIS — Z794 Long term (current) use of insulin: Secondary | ICD-10-CM | POA: Diagnosis not present

## 2022-06-03 DIAGNOSIS — Z7984 Long term (current) use of oral hypoglycemic drugs: Secondary | ICD-10-CM | POA: Diagnosis not present

## 2022-06-03 DIAGNOSIS — E1165 Type 2 diabetes mellitus with hyperglycemia: Secondary | ICD-10-CM | POA: Diagnosis not present

## 2022-06-03 DIAGNOSIS — E1129 Type 2 diabetes mellitus with other diabetic kidney complication: Secondary | ICD-10-CM | POA: Diagnosis not present

## 2022-06-03 DIAGNOSIS — R809 Proteinuria, unspecified: Secondary | ICD-10-CM | POA: Diagnosis not present

## 2022-06-06 DIAGNOSIS — M25562 Pain in left knee: Secondary | ICD-10-CM | POA: Diagnosis not present

## 2022-07-05 DIAGNOSIS — Z1231 Encounter for screening mammogram for malignant neoplasm of breast: Secondary | ICD-10-CM | POA: Diagnosis not present

## 2022-08-01 NOTE — Progress Notes (Signed)
Patient Care Team: Nicoletta Dress, MD as PCP - General (Internal Medicine) Berniece Salines, DO as PCP - Cardiology (Cardiology) Derwood Kaplan, MD as Consulting Physician (Oncology) Maylon Cos, MD as Referring Physician (Gastroenterology)  Clinic Day: 08/03/22   Referring physician: Nicoletta Dress, MD  ASSESSMENT & PLAN:   Assessment & Plan: 1. Iron deficiency anemia, felt to be secondary to chronic GI blood loss from AVM's, as well as intermittent epistaxis. She has required regular IV iron replacement and transfusions.  She saw Dr. Gershon Cull at [facility] and he did cauterize her AVM's.  She finally stabilized and stopped bleeding. She has not required transfusions since the summer of 2022.  However her hemoglobin dropped to 9.9 in March of 2023 and she was found to be iron deficient.  She was given IV iron in April 2023, and has not required any since then.   2. Non-alcoholic liver cirrhosis, with intermittent mild thrombocytopenia.  Her anemia is also likely partly related to the cirrhosis.   3. Ascites, improved and stable, for which she is on spironolactone and furosemide.   4. Hepatic encephalopathy.  Her serum ammonia is normal.  5. Multiple AVM's of the small bowel seen on capsule endoscopy in [facility], treated at [facility] in February 2021. I don't think she is bleeding any longer.  She saw Dr. Gershon Cull in June and two small AVMs were treated with argon plasma coagulation.  Her hemoglobin has been stable and she has not required further transfusions.   6.  Thrombocytopenia, resolved.  Plan I can see her back in 6 months with CBC and comprehensive metabolic profile. She says she is sleeping all the time, I will add a TSH to check her thyroid. The patient understands the plans discussed today and is in agreement with them.  She knows to contact our office if she develops concerns prior to her next appointment.  I provided 20 minutes of  face-to-face time during this this encounter and > 50% was spent counseling as documented under my assessment and plan.   Derwood Kaplan, MD  [place] [address] [place] [address] Dept: [phone] Dept Fax: [phone]   No orders of the defined types were placed in this encounter.     CHIEF COMPLAINT:  CC: A 72 year old female with history of iron deficiency anemia here for 6 week evaluation  Current Treatment:  Surveillance   HISTORY OF PRESENT ILLNESS:  [name] is a 72 y.o. female  who we began seeing in December 2019 for iron deficiency anemia. In November 2018, colonoscopy revealed tubular adenomas.  Upper endoscopy in January 2018 did not reveal any specific findings.  She was clearly iron deficient.  She has multiple comorbidities, including diabetes, liver cirrhosis, COPD, hyperlipidemia, and degenerative disc disease.  Testing for any other deficiency or monoclonal spike was negative. She had already been on oral supplement for nearly 1 year with lack of response, so was given IV iron in the form of Feraheme on several occasions. Repeat colonoscopy and EGD were done in August 2020 by Dr. Noberto Retort.  Biopsy was negative.  When we saw her later in September, the hemoglobin had dropped down to 7.5.  She was transfused with 2 units of packed red blood cells.  Repeat iron studies once again revealed iron deficiency, so was given IV Feraheme.  CT abdomen and pelvis was negative.  She felt so  much better after the transfusion that she jumped out of bed and fractured her right foot in a fall.  She was given IV Feraheme again in November.  Capsule endoscopy in November 2020 in [facility] revealed multiple AVM's of the small bowel.  She had cauterization of an AVM of the small bowel at [facility] in February 2021. In March 2021, her hemoglobin dropped back down 7.6.  She therefore received IV Feraheme  again in early April.  Abdominal ultrasound in May revealed cirrhosis of the liver with nodular surface, increased echogenicity and heterogeneity.  No focal liver parenchymal lesion was observed.  She has had intermittent mild thrombocytopenia, presumably from her liver cirrhosis. She received IV Feraheme in June 2021, and received 2 units of packed red blood cells at that time. She was admitted to [facility] later in June with generalized weakness, altered mental status, and UTI.  While out of town, she developed severe epistaxis that required packing and 1 unit of packed red blood cells.  CT imaging revealed small amount of blood in the right maxillary and sphenoid sinuses with tube placed in the right paranasal sinus.     She developed a severe epistaxis requiring hospital admission in late July 2021 and had a nasal bullet in place.  Her hemoglobin dropped down to 6.9, she was transfused, and given a dose of IV Feraheme while in the hospital.  She had an episode of hypotension and fever and was felt to be septic, so was transferred to the ICU.  She was placed on broad-spectrum IV antibiotics, and cultures remained negative.  Echocardiogram showed a suspicious mass in the right atrium, which may be attached to the interatrial septum.  The cardiologist reviewed the echocardiogram and felt that this was likely an artifact and not a mass, vegetation or thrombus. Her hemoglobin was 8.7 at the time of discharge, and she  was seen in August with weakness of her hands,shakiness, dropping objects, fatigue, dyspnea with exertion and moderate abdominal swelling.  She had asterixis, and ammonia level was elevated at 62.  She was therefore placed on lactulose 30 mL twice a day. She was seen a week later with some improvement, but her serum ammonia was still 54, so the lactulose was increased to TID.  She also had hyperkalemia, so her spironolactone 25 mg was held.  She was transfused with 2 units of PRBCs again at the end of  September when her hemoglobin dropped down to 7.3.  Iron level was 13.0 with a TIBC of 415 for a saturation of 3.1%. She was scheduled for 2 more doses of IV Feraheme.  She was admitted in October  with a fever of 105 and had E coli sepsis. Lactulose was placed on hold due to severe diarrhea, adding to her dehydration. Her serum ammonia came down to 9, so we did not resume lactulose.  She did have a new splenic infarction as of this admission, which was causing a lot of left abdominal pain.  Her spironolactone was increased to 50 mg to take 1 to 2 daily.  At her visit in November, she was placed back on lactulose daily.   When she was seen in early February 2022, her CBC reveals a hemoglobin of 7.6 with normal ANC and platelets.  She was therefore transfused 2 units of PRBCs. Iron studies revealed recurrent iron deficiency.. Ammonia level was elevated at 35,  so her lactulose was increased to 30 cc twice daily.  She continued to have blood  loss with black stools and one episode of moderate epistaxis,  She saw the gastroenterologist at [facility] on May 27th, Dr. Birdie Hopes, and had an EGD with plasma coagulation to cauterize two AVM's.  He has now recommended observation and will see her back if her hemoglobin drops again, and we document iron deficiency.  She last received IV iron in July/August 2022. She was seen last month, November 2022, for worsening symptoms and was found to have increased ascites. I recommended increase of spironolactone to 50 mg BID. Her hemoglobin was stable and serum ammonia was normal.    INTERVAL HISTORY:  Dondrea is here today for repeat clinical assessment of iron deficiency anemia. She states that she feels well but she is fatigued. She states she has been falling asleep during the day. She has not required IV iron in a long time. Her labs and chemistries are unremarkable. I will add TSH to her labs today. Her hemoglobin is at 13.7 and her non fasting blood sugar is 287. I am not  sure what is causing her fatigue. Her serum ammonia is normal today.  She denies signs of infection such as sore throat, sinus drainage, cough, or urinary symptoms.  She denies fevers or recurrent chills. She denies pain. She denies nausea, vomiting, chest pain, dyspnea or cough. Her weight has decreased 4 pounds over last 5 months .  I have reviewed the past medical history, past surgical history, social history and family history with the patient and they are unchanged from previous note.  ALLERGIES:  has No Known Allergies.  MEDICATIONS:  Current Outpatient Medications  Medication Sig Dispense Refill   FARXIGA 10 MG TABS tablet Take 10 mg by mouth every morning.     albuterol (VENTOLIN HFA) 108 (90 Base) MCG/ACT inhaler Inhale into the lungs.     colesevelam (WELCHOL) 625 MG tablet Take 1,875 mg by mouth 2 (two) times daily.     ezetimibe (ZETIA) 10 MG tablet Take 10 mg by mouth daily.     furosemide (LASIX) 40 MG tablet Take 1 tablet by mouth daily.     insulin aspart (NOVOLOG) 100 UNIT/ML FlexPen Inject 30 Units into the skin 2 (two) times daily.     lactulose (CHRONULAC) 10 GM/15ML solution 30 ML THREE TIMES A DAY 2838 mL 5   levothyroxine (SYNTHROID) 50 MCG tablet Take 50 mcg by mouth daily.     metFORMIN (GLUCOPHAGE-XR) 500 MG 24 hr tablet Take 500 mg by mouth 2 (two) times daily with a meal.  5   ondansetron (ZOFRAN) 4 MG tablet Take 1 tablet (4 mg total) by mouth every 4 (four) hours as needed for nausea. 90 tablet 3   OZEMPIC, 0.25 OR 0.5 MG/DOSE, 2 MG/3ML SOPN Inject into the skin.     pregabalin (LYRICA) 75 MG capsule Take 75 mg by mouth at bedtime.  1   promethazine-codeine (PHENERGAN WITH CODEINE) 6.25-10 MG/5ML syrup Take 5 mLs by mouth every 6 (six) hours as needed.     rOPINIRole (REQUIP) 2 MG tablet Take 1 tablet by mouth at bedtime.  3   spironolactone (ALDACTONE) 50 MG tablet Take 1 tablet (50 mg total) by mouth daily. 90 tablet 1   TOUJEO MAX SOLOSTAR 300 UNIT/ML  Solostar Pen 40 Units.     valACYclovir (VALTREX) 1000 MG tablet Take 1,000 mg by mouth 2 (two) times daily.     Vitamin D, Ergocalciferol, (DRISDOL) 1.25 MG (50000 UT) CAPS capsule Take 1 capsule by mouth once a  week.     No current facility-administered medications for this visit.    HISTORY OF PRESENT ILLNESS:   Oncology History  Iron deficiency anemia    REVIEW OF SYSTEMS:  Review of Systems  Constitutional:  Positive for fatigue. Negative for appetite change, chills, diaphoresis, fever and unexpected weight change.  HENT:  Negative.  Negative for hearing loss, lump/mass, mouth sores, nosebleeds, sore throat, tinnitus, trouble swallowing and voice change.   Eyes: Negative.  Negative for eye problems and icterus.  Respiratory: Negative.  Negative for chest tightness, cough, hemoptysis, shortness of breath and wheezing.   Cardiovascular: Negative.  Negative for chest pain, leg swelling and palpitations.  Gastrointestinal: Negative.  Negative for abdominal distention, abdominal pain, blood in stool, constipation, diarrhea, nausea, rectal pain and vomiting.  Endocrine: Negative.   Genitourinary: Negative.  Negative for bladder incontinence, difficulty urinating, dyspareunia, dysuria, frequency, hematuria, menstrual problem, nocturia, pelvic pain, vaginal bleeding and vaginal discharge.   Musculoskeletal:  Negative for arthralgias, back pain, flank pain, gait problem, myalgias, neck pain and neck stiffness.  Skin: Negative.  Negative for itching, rash and wound.  Neurological:  Negative for dizziness, extremity weakness, gait problem, headaches, light-headedness, numbness, seizures and speech difficulty.  Hematological: Negative.  Negative for adenopathy. Does not bruise/bleed easily.  Psychiatric/Behavioral: Negative.  Negative for confusion, decreased concentration, depression, sleep disturbance and suicidal ideas. The patient is not nervous/anxious.    VITALS:  Blood pressure (!)  151/64, pulse 83, temperature 98 F (36.7 C), temperature source Oral, resp. rate 18, height '5\' 3"'$  (1.6 m), weight 163 lb 8 oz (74.2 kg), SpO2 100 %.  Wt Readings from Last 3 Encounters:  08/03/22 163 lb 8 oz (74.2 kg)  04/21/22 167 lb 11.2 oz (76.1 kg)  03/04/22 168 lb 14.4 oz (76.6 kg)    Body mass index is 28.96 kg/m.  Performance status (ECOG): 1 - Symptomatic but completely ambulatory  PHYSICAL EXAM:  Physical Exam Vitals and nursing note reviewed.  Constitutional:      General: She is not in acute distress.    Appearance: Normal appearance. She is normal weight. She is not ill-appearing, toxic-appearing or diaphoretic.  HENT:     Head: Normocephalic and atraumatic.     Right Ear: Tympanic membrane, ear canal and external ear normal. There is no impacted cerumen.     Left Ear: Tympanic membrane, ear canal and external ear normal. There is no impacted cerumen.     Nose: Nose normal. No congestion or rhinorrhea.     Mouth/Throat:     Mouth: Mucous membranes are moist.     Pharynx: Oropharynx is clear. No oropharyngeal exudate or posterior oropharyngeal erythema.  Eyes:     General: No scleral icterus.       Right eye: No discharge.        Left eye: No discharge.     Extraocular Movements: Extraocular movements intact.     Conjunctiva/sclera: Conjunctivae normal.     Pupils: Pupils are equal, round, and reactive to light.  Neck:     Vascular: No carotid bruit.  Cardiovascular:     Rate and Rhythm: Normal rate and regular rhythm.     Pulses: Normal pulses.     Heart sounds: Normal heart sounds. No murmur heard.    No friction rub. No gallop.  Pulmonary:     Effort: Pulmonary effort is normal. No respiratory distress.     Breath sounds: Normal breath sounds. No stridor. No wheezing, rhonchi or rales.  Chest:  Chest wall: No tenderness.  Abdominal:     General: Bowel sounds are normal. There is distension (slight and mild ascites).     Palpations: Abdomen is soft.  There is no hepatomegaly, splenomegaly or mass.     Tenderness: There is no abdominal tenderness. There is no right CVA tenderness, left CVA tenderness, guarding or rebound.     Hernia: No hernia is present.  Musculoskeletal:        General: No swelling, tenderness, deformity or signs of injury. Normal range of motion.     Cervical back: Normal range of motion and neck supple. No rigidity or tenderness.     Right lower leg: No edema.     Left lower leg: No edema.  Lymphadenopathy:     Cervical: No cervical adenopathy.     Right cervical: No superficial, deep or posterior cervical adenopathy.    Left cervical: No superficial, deep or posterior cervical adenopathy.     Upper Body:     Right upper body: No supraclavicular, axillary or pectoral adenopathy.     Left upper body: No supraclavicular, axillary or pectoral adenopathy.  Skin:    General: Skin is warm and dry.     Coloration: Skin is not jaundiced or pale.     Findings: No bruising, erythema, lesion or rash.  Neurological:     General: No focal deficit present.     Mental Status: She is alert and oriented to person, place, and time. Mental status is at baseline.     Cranial Nerves: No cranial nerve deficit.     Sensory: No sensory deficit.     Motor: No weakness.     Coordination: Coordination normal.     Gait: Gait normal.     Deep Tendon Reflexes: Reflexes normal.  Psychiatric:        Mood and Affect: Mood normal.        Behavior: Behavior normal.        Thought Content: Thought content normal.        Judgment: Judgment normal.     LABORATORY DATA:  I have reviewed the data as listed    Component Value Date/Time   NA 140 08/03/2022 0000   K 3.7 08/03/2022 0000   CL 105 08/03/2022 0000   CO2 26 (A) 08/03/2022 0000   GLUCOSE 143 (H) 04/18/2011 0607   BUN 14 08/03/2022 0000   CREATININE 0.8 08/03/2022 0000   CREATININE <0.47 (L) 04/18/2011 0607   CALCIUM 9.8 08/03/2022 0000   PROT 7.0 04/14/2011 1758   ALBUMIN  4.1 08/03/2022 0000   AST 32 08/03/2022 0000   ALT 23 08/03/2022 0000   ALKPHOS 57 08/03/2022 0000   BILITOT 1.0 04/14/2011 1758   GFRNONAA 79 03/12/2021 0000   GFRAA NOT CALCULATED 04/18/2011 0607    No results found for: "SPEP", "UPEP"  Lab Results  Component Value Date   WBC 6.5 08/03/2022   NEUTROABS 4.36 08/03/2022   HGB 13.7 08/03/2022   HCT 40 08/03/2022   MCV 98 11/26/2020   PLT 135 (A) 08/03/2022      Chemistry      Component Value Date/Time   NA 140 08/03/2022 0000   K 3.7 08/03/2022 0000   CL 105 08/03/2022 0000   CO2 26 (A) 08/03/2022 0000   BUN 14 08/03/2022 0000   CREATININE 0.8 08/03/2022 0000   CREATININE <0.47 (L) 04/18/2011 0607   GLU 287 08/03/2022 0000      Component Value Date/Time  CALCIUM 9.8 08/03/2022 0000   ALKPHOS 57 08/03/2022 0000   AST 32 08/03/2022 0000   ALT 23 08/03/2022 0000   BILITOT 1.0 04/14/2011 1758       RADIOGRAPHIC STUDIES: No results found. EXAM:03/11/22 CT ABDOMEN WITH CONTRAST IMPRESSION: Cirrhosis. No suspicious liver lesion or contrast enhancement. Abdominal varices, including recanalization of the umbilical vein and caput carices as well as splenorenal varices in the left upper quadrant. Coronary artery disease.    I,Jasmine M Lassiter,acting as a scribe for Derwood Kaplan, MD.,have documented all relevant documentation on the behalf of Derwood Kaplan, MD,as directed by  Derwood Kaplan, MD while in the presence of Derwood Kaplan, MD.

## 2022-08-03 ENCOUNTER — Other Ambulatory Visit: Payer: Self-pay | Admitting: Oncology

## 2022-08-03 ENCOUNTER — Inpatient Hospital Stay: Payer: Medicare Other

## 2022-08-03 ENCOUNTER — Encounter: Payer: Self-pay | Admitting: Oncology

## 2022-08-03 ENCOUNTER — Inpatient Hospital Stay: Payer: Medicare Other | Attending: Oncology | Admitting: Oncology

## 2022-08-03 VITALS — BP 151/64 | HR 83 | Temp 98.0°F | Resp 18 | Ht 63.0 in | Wt 163.5 lb

## 2022-08-03 DIAGNOSIS — K7460 Unspecified cirrhosis of liver: Secondary | ICD-10-CM

## 2022-08-03 DIAGNOSIS — D509 Iron deficiency anemia, unspecified: Secondary | ICD-10-CM | POA: Diagnosis not present

## 2022-08-03 DIAGNOSIS — R5382 Chronic fatigue, unspecified: Secondary | ICD-10-CM

## 2022-08-03 DIAGNOSIS — D500 Iron deficiency anemia secondary to blood loss (chronic): Secondary | ICD-10-CM

## 2022-08-03 DIAGNOSIS — Z79899 Other long term (current) drug therapy: Secondary | ICD-10-CM | POA: Insufficient documentation

## 2022-08-03 DIAGNOSIS — D649 Anemia, unspecified: Secondary | ICD-10-CM | POA: Diagnosis not present

## 2022-08-03 DIAGNOSIS — D696 Thrombocytopenia, unspecified: Secondary | ICD-10-CM

## 2022-08-03 LAB — CBC AND DIFFERENTIAL
HCT: 40 (ref 36–46)
Hemoglobin: 13.7 (ref 12.0–16.0)
Neutrophils Absolute: 4.36
Platelets: 135 10*3/uL — AB (ref 150–400)
WBC: 6.5

## 2022-08-03 LAB — HEPATIC FUNCTION PANEL
ALT: 23 U/L (ref 7–35)
AST: 32 (ref 13–35)
Alkaline Phosphatase: 57 (ref 25–125)
Bilirubin, Total: 1.1

## 2022-08-03 LAB — BASIC METABOLIC PANEL
BUN: 14 (ref 4–21)
CO2: 26 — AB (ref 13–22)
Chloride: 105 (ref 99–108)
Creatinine: 0.8 (ref 0.5–1.1)
Glucose: 287
Potassium: 3.7 mEq/L (ref 3.5–5.1)
Sodium: 140 (ref 137–147)

## 2022-08-03 LAB — TSH: TSH: 2.517 u[IU]/mL (ref 0.350–4.500)

## 2022-08-03 LAB — AMMONIA: Ammonia: <9

## 2022-08-03 LAB — COMPREHENSIVE METABOLIC PANEL
Albumin: 4.1 (ref 3.5–5.0)
Calcium: 9.8 (ref 8.7–10.7)

## 2022-08-03 LAB — CBC: RBC: 4.13 (ref 3.87–5.11)

## 2022-08-22 DIAGNOSIS — U071 COVID-19: Secondary | ICD-10-CM | POA: Diagnosis not present

## 2022-08-25 ENCOUNTER — Encounter: Payer: Self-pay | Admitting: Oncology

## 2022-09-06 DIAGNOSIS — M810 Age-related osteoporosis without current pathological fracture: Secondary | ICD-10-CM | POA: Diagnosis not present

## 2022-09-06 DIAGNOSIS — K7460 Unspecified cirrhosis of liver: Secondary | ICD-10-CM | POA: Diagnosis not present

## 2022-09-06 DIAGNOSIS — E039 Hypothyroidism, unspecified: Secondary | ICD-10-CM | POA: Diagnosis not present

## 2022-09-06 DIAGNOSIS — G729 Myopathy, unspecified: Secondary | ICD-10-CM | POA: Diagnosis not present

## 2022-09-06 DIAGNOSIS — E1129 Type 2 diabetes mellitus with other diabetic kidney complication: Secondary | ICD-10-CM | POA: Diagnosis not present

## 2022-09-06 DIAGNOSIS — E785 Hyperlipidemia, unspecified: Secondary | ICD-10-CM | POA: Diagnosis not present

## 2022-09-06 DIAGNOSIS — E559 Vitamin D deficiency, unspecified: Secondary | ICD-10-CM | POA: Diagnosis not present

## 2022-09-06 DIAGNOSIS — K7581 Nonalcoholic steatohepatitis (NASH): Secondary | ICD-10-CM | POA: Diagnosis not present

## 2022-09-06 DIAGNOSIS — I10 Essential (primary) hypertension: Secondary | ICD-10-CM | POA: Diagnosis not present

## 2022-09-06 DIAGNOSIS — R809 Proteinuria, unspecified: Secondary | ICD-10-CM | POA: Diagnosis not present

## 2022-09-06 DIAGNOSIS — D509 Iron deficiency anemia, unspecified: Secondary | ICD-10-CM | POA: Diagnosis not present

## 2022-09-06 DIAGNOSIS — A6000 Herpesviral infection of urogenital system, unspecified: Secondary | ICD-10-CM | POA: Diagnosis not present

## 2022-09-08 ENCOUNTER — Encounter: Payer: Self-pay | Admitting: Oncology

## 2022-09-08 DIAGNOSIS — H2513 Age-related nuclear cataract, bilateral: Secondary | ICD-10-CM | POA: Diagnosis not present

## 2022-09-09 DIAGNOSIS — Z7985 Long-term (current) use of injectable non-insulin antidiabetic drugs: Secondary | ICD-10-CM | POA: Diagnosis not present

## 2022-09-09 DIAGNOSIS — Z794 Long term (current) use of insulin: Secondary | ICD-10-CM | POA: Diagnosis not present

## 2022-09-09 DIAGNOSIS — E1165 Type 2 diabetes mellitus with hyperglycemia: Secondary | ICD-10-CM | POA: Diagnosis not present

## 2022-09-09 DIAGNOSIS — E1129 Type 2 diabetes mellitus with other diabetic kidney complication: Secondary | ICD-10-CM | POA: Diagnosis not present

## 2022-09-09 DIAGNOSIS — Z7984 Long term (current) use of oral hypoglycemic drugs: Secondary | ICD-10-CM | POA: Diagnosis not present

## 2022-09-09 DIAGNOSIS — R809 Proteinuria, unspecified: Secondary | ICD-10-CM | POA: Diagnosis not present

## 2022-09-28 DIAGNOSIS — L570 Actinic keratosis: Secondary | ICD-10-CM | POA: Diagnosis not present

## 2022-10-04 ENCOUNTER — Ambulatory Visit (INDEPENDENT_AMBULATORY_CARE_PROVIDER_SITE_OTHER): Payer: Medicare Other | Admitting: Diagnostic Neuroimaging

## 2022-10-04 ENCOUNTER — Encounter: Payer: Self-pay | Admitting: Neurology

## 2022-10-04 ENCOUNTER — Ambulatory Visit (HOSPITAL_BASED_OUTPATIENT_CLINIC_OR_DEPARTMENT_OTHER)
Admission: RE | Admit: 2022-10-04 | Discharge: 2022-10-04 | Disposition: A | Discharge status: Home / Self Care | Payer: Medicare Other | Source: Ambulatory Visit | Attending: Diagnostic Neuroimaging | Admitting: Diagnostic Neuroimaging

## 2022-10-04 ENCOUNTER — Other Ambulatory Visit: Payer: Medicare Other

## 2022-10-04 ENCOUNTER — Other Ambulatory Visit: Payer: Self-pay | Admitting: Neurology

## 2022-10-04 ENCOUNTER — Telehealth: Payer: Self-pay | Admitting: Diagnostic Neuroimaging

## 2022-10-04 ENCOUNTER — Encounter: Payer: Self-pay | Admitting: Diagnostic Neuroimaging

## 2022-10-04 VITALS — BP 152/72 | HR 80 | Ht 63.0 in | Wt 161.8 lb

## 2022-10-04 DIAGNOSIS — M25471 Effusion, right ankle: Secondary | ICD-10-CM

## 2022-10-04 DIAGNOSIS — M79604 Pain in right leg: Secondary | ICD-10-CM | POA: Diagnosis not present

## 2022-10-04 DIAGNOSIS — M5431 Sciatica, right side: Secondary | ICD-10-CM | POA: Diagnosis not present

## 2022-10-04 DIAGNOSIS — R600 Localized edema: Secondary | ICD-10-CM | POA: Diagnosis not present

## 2022-10-04 NOTE — Telephone Encounter (Signed)
Referral for physical therapy fax to [facility]. Phone: [phone]: [phone]

## 2022-10-04 NOTE — Patient Instructions (Signed)
 -   right leg u/t (rule out DVT); will order stat to get done today  - continue supportive care; refer to PT evaluation; consider gabapentin if pain is severe

## 2022-10-04 NOTE — Progress Notes (Signed)
[facility]  PATIENT: [name] DOB: 1951/04/17  REFERRING CLINICIAN: Nicoletta Dress, MD HISTORY FROM: patient  REASON FOR VISIT: new consult   HISTORICAL  CHIEF COMPLAINT:  Chief Complaint  Patient presents with   New Patient (Initial Visit)    Patient in room #6 and alone. Patient here today to discuss her neuropathy of bilateral lower extremities.    HISTORY OF PRESENT ILLNESS:   72 year old female here for evaluation of right leg numbness and pain.  Symptoms started in January 2024.  She noticed some pain and swelling on her right medial ankle and calf region.  This is tender to touch.  Symptoms slightly improving in the last 1 week.  Symptoms have been aggravated with standing pressure and walking.  She has history of bilateral "sciatica" symptoms with pain radiating from her lower back into her buttocks, hips and thighs.  Symptoms worse on the right than left side.  Had MRI in 2006 for back pain and right leg pain status post surgery demonstrating right L5-S1 hemilaminectomy discectomy.  Also has diabetes with hemoglobin A1c greater than 10 in 2023.  Recent A1c is 8.   REVIEW OF SYSTEMS: Full 14 system review of systems performed and negative with exception of: as per hPI.  ALLERGIES: No Known Allergies  HOME MEDICATIONS: Outpatient Medications Prior to Visit  Medication Sig Dispense Refill   albuterol (VENTOLIN HFA) 108 (90 Base) MCG/ACT inhaler Inhale into the lungs.     escitalopram (LEXAPRO) 10 MG tablet Take 10 mg by mouth as needed.     furosemide (LASIX) 40 MG tablet Take 1 tablet by mouth daily.     insulin aspart (NOVOLOG) 100 UNIT/ML FlexPen Inject 30 Units into the skin 2 (two) times daily.     lactulose (CHRONULAC) 10 GM/15ML solution 30 ML THREE TIMES A DAY 2838 mL 5   metFORMIN (GLUCOPHAGE-XR) 500 MG 24 hr tablet Take 500 mg by mouth 2 (two) times daily with a meal.  5   ondansetron (ZOFRAN) 4 MG tablet Take 1 tablet (4  mg total) by mouth every 4 (four) hours as needed for nausea. 90 tablet 3   OZEMPIC, 0.25 OR 0.5 MG/DOSE, 2 MG/3ML SOPN Inject into the skin.     pregabalin (LYRICA) 75 MG capsule Take 75 mg by mouth at bedtime.  1   rOPINIRole (REQUIP) 2 MG tablet Take 1 tablet by mouth at bedtime.  3   spironolactone (ALDACTONE) 50 MG tablet Take 1 tablet (50 mg total) by mouth daily. 90 tablet 1   TOUJEO MAX SOLOSTAR 300 UNIT/ML Solostar Pen 40 Units.     valACYclovir (VALTREX) 1000 MG tablet Take 1,000 mg by mouth 2 (two) times daily.     Vitamin D, Ergocalciferol, (DRISDOL) 1.25 MG (50000 UT) CAPS capsule Take 1 capsule by mouth once a week.     colesevelam (WELCHOL) 625 MG tablet Take 1,875 mg by mouth 2 (two) times daily. (Patient not taking: Reported on 10/04/2022)     ezetimibe (ZETIA) 10 MG tablet Take 10 mg by mouth daily.     FARXIGA 10 MG TABS tablet Take 10 mg by mouth every morning.     levothyroxine (SYNTHROID) 50 MCG tablet Take 50 mcg by mouth daily.     promethazine-codeine (PHENERGAN WITH CODEINE) 6.25-10 MG/5ML syrup Take 5 mLs by mouth every 6 (six) hours as needed.     No facility-administered medications prior to visit.    PAST MEDICAL HISTORY: Past Medical History:  Diagnosis Date   Anemia 07/02/2018   Aortic stenosis, mild 09/03/2018   Cirrhosis of liver without ascites ([place]) 07/03/2019   Diabetes mellitus due to underlying condition with unspecified complications ([place]) [id number]   Diabetes mellitus without complication ([place])    Essential hypertension 07/02/2018   Hematochezia 06/20/2016   History of colonic polyps 02/18/2019   Hyperlipidemia    Hypertension    Iron deficiency anemia 06/20/2016   Iron deficiency anemia, unspecified    RUQ pain 06/20/2016   Tightness in chest 09/03/2018    PAST SURGICAL HISTORY: Past Surgical History:  Procedure Laterality Date   ABDOMINAL HYSTERECTOMY     BACK SURGERY      FAMILY HISTORY: Family History  Problem Relation Age of Onset    Heart disease Mother    Alzheimer's disease Mother    Cancer Father    Lymphoma Father    Parkinson's disease Paternal Grandmother    Kidney cancer Paternal Uncle     SOCIAL HISTORY: Social History   Socioeconomic History   Marital status: Divorced    Spouse name: Not on file   Number of children: Not on file   Years of education: Not on file   Highest education level: Not on file  Occupational History   Not on file  Tobacco Use   Smoking status: Former   Smokeless tobacco: Never   Tobacco comments:    Social in 41 and 1  Vaping Use   Vaping Use: Never used  Substance and Sexual Activity   Alcohol use: Never   Drug use: Never   Sexual activity: Not on file  Other Topics Concern   Not on file  Social History Narrative   Not on file   Social Determinants of Health   Financial Resource Strain: Not on file  Food Insecurity: Not on file  Transportation Needs: Not on file  Physical Activity: Not on file  Stress: Not on file  Social Connections: Not on file  Intimate Partner Violence: Not on file     PHYSICAL EXAM  GENERAL EXAM/CONSTITUTIONAL: Vitals:  Vitals:   10/04/22 1018  BP: (!) 152/72  Pulse: 80  Weight: 161 lb 12.8 oz (73.4 kg)  Height: '5\' 3"'$  (1.6 m)   Body mass index is 28.66 kg/m. Wt Readings from Last 3 Encounters:  10/04/22 161 lb 12.8 oz (73.4 kg)  08/03/22 163 lb 8 oz (74.2 kg)  04/21/22 167 lb 11.2 oz (76.1 kg)   Patient is in no distress; well developed, nourished and groomed; neck is supple  CARDIOVASCULAR: Examination of carotid arteries is normal; no carotid bruits Regular rate and rhythm, no murmurs Examination of peripheral vascular system by observation and palpation is normal  EYES: Ophthalmoscopic exam of optic discs and posterior segments is normal; no papilledema or hemorrhages No results found.  MUSCULOSKELETAL: Gait, strength, tone, movements noted in Neurologic exam below  NEUROLOGIC: MENTAL STATUS:      No data  to display         awake, alert, oriented to person, place and time recent and remote memory intact normal attention and concentration language fluent, comprehension intact, naming intact fund of knowledge appropriate  CRANIAL NERVE:  2nd - no papilledema on fundoscopic exam 2nd, 3rd, 4th, 6th - pupils equal and reactive to light, visual fields full to confrontation, extraocular muscles intact, no nystagmus 5th - facial sensation symmetric 7th - facial strength symmetric 8th - hearing intact 9th - palate elevates symmetrically, uvula midline 11th - shoulder shrug symmetric  12th - tongue protrusion midline  MOTOR:  normal bulk and tone, full strength in the BUE, BLE; EXCEPT RIGHT HIP FLEX 3-4 (LIMITED BY PAIN); RIGHT KNEE EXT 4+, KNEE FLEX 4+, FOOT DF 4+  SENSORY:  normal and symmetric to light touch, pinprick, temperature, vibration; EXCEPT DECR PP IN RIGHT LEG BELOW KNEE  COORDINATION:  finger-nose-finger, fine finger movements normal  REFLEXES:  deep tendon reflexes TRACE and symmetric  GAIT/STATION:  narrow based gait     DIAGNOSTIC DATA (LABS, IMAGING, TESTING) - I reviewed patient records, labs, notes, testing and imaging myself where available.  Lab Results  Component Value Date   WBC 6.5 08/03/2022   HGB 13.7 08/03/2022   HCT 40 08/03/2022   MCV 98 11/26/2020   PLT 135 (A) 08/03/2022      Component Value Date/Time   NA 140 08/03/2022 0000   K 3.7 08/03/2022 0000   CL 105 08/03/2022 0000   CO2 26 (A) 08/03/2022 0000   GLUCOSE 143 (H) 04/18/2011 0607   BUN 14 08/03/2022 0000   CREATININE 0.8 08/03/2022 0000   CREATININE <0.47 (L) 04/18/2011 0607   CALCIUM 9.8 08/03/2022 0000   PROT 7.0 04/14/2011 1758   ALBUMIN 4.1 08/03/2022 0000   AST 32 08/03/2022 0000   ALT 23 08/03/2022 0000   ALKPHOS 57 08/03/2022 0000   BILITOT 1.0 04/14/2011 1758   GFRNONAA 79 03/12/2021 0000   GFRAA NOT CALCULATED 04/18/2011 0607   Lab Results  Component Value Date    CHOL 134 03/12/2021   HDL 59 03/12/2021   LDLCALC 60 03/12/2021   TRIG 74 03/12/2021   Lab Results  Component Value Date   HGBA1C 7.2 03/12/2021   No results found for: "VITAMINB12" Lab Results  Component Value Date   TSH 2.517 08/03/2022    04/25/05 MRI lumbar spine 1.  Satisfactory appearance status-post right L5-S1 hemilaminectomy and discectomy without residual compression of the S-1 nerve root, foraminal narrowing, or postoperative complication.  2.  Small central protrusion L4-5 without neural encroachment.  3.  Small left paracentral protrusion L5-S1 which may mildly displace the left S-1 nerve root.     ASSESSMENT AND PLAN  72 y.o. year old female here with:  Dx:  1. Right leg pain   2. Right ankle swelling   3. Right sciatic nerve pain     PLAN:  RIGHT LEG PAIN, NUMBNESS (likely right lumbar radiculopathy; need to rule out DVT; other possibilities include musculoskeletal strain / sprain, diabetic peripheral neuropathy, diabetic lumbar plexopathy) - right leg u/t (rule out DVT); will order stat to get done today - continue supportive care; refer to PT evaluation; consider gabapentin if pain is severe  Orders Placed This Encounter  Procedures   US Venous Img Lower Unilateral Right (DVT)   Ambulatory referral to Physical Therapy   Return for pending test results, pending if symptoms worsen or fail to improve.  I reviewed images, labs, notes, records myself. I summarized findings and reviewed with patient, for this high risk condition (acute right leg pain; rule out DVT) requiring high complexity decision making.    Penni Bombard, MD [id number], [id number] AM Certified in Neurology, Neurophysiology and Neuroimaging  [facility] [address], [place], [place] [address] [phone]

## 2022-10-12 DIAGNOSIS — M25471 Effusion, right ankle: Secondary | ICD-10-CM | POA: Diagnosis not present

## 2022-10-12 DIAGNOSIS — M79604 Pain in right leg: Secondary | ICD-10-CM | POA: Diagnosis not present

## 2022-10-12 DIAGNOSIS — M5431 Sciatica, right side: Secondary | ICD-10-CM | POA: Diagnosis not present

## 2022-10-12 DIAGNOSIS — M6281 Muscle weakness (generalized): Secondary | ICD-10-CM | POA: Diagnosis not present

## 2022-10-14 DIAGNOSIS — M5431 Sciatica, right side: Secondary | ICD-10-CM | POA: Diagnosis not present

## 2022-10-14 DIAGNOSIS — M6281 Muscle weakness (generalized): Secondary | ICD-10-CM | POA: Diagnosis not present

## 2022-10-14 DIAGNOSIS — M79604 Pain in right leg: Secondary | ICD-10-CM | POA: Diagnosis not present

## 2022-10-14 DIAGNOSIS — M25471 Effusion, right ankle: Secondary | ICD-10-CM | POA: Diagnosis not present

## 2022-10-18 DIAGNOSIS — M25471 Effusion, right ankle: Secondary | ICD-10-CM | POA: Diagnosis not present

## 2022-10-18 DIAGNOSIS — M6281 Muscle weakness (generalized): Secondary | ICD-10-CM | POA: Diagnosis not present

## 2022-10-18 DIAGNOSIS — M5431 Sciatica, right side: Secondary | ICD-10-CM | POA: Diagnosis not present

## 2022-10-18 DIAGNOSIS — M79604 Pain in right leg: Secondary | ICD-10-CM | POA: Diagnosis not present

## 2022-10-20 DIAGNOSIS — M79604 Pain in right leg: Secondary | ICD-10-CM | POA: Diagnosis not present

## 2022-10-20 DIAGNOSIS — M5431 Sciatica, right side: Secondary | ICD-10-CM | POA: Diagnosis not present

## 2022-10-20 DIAGNOSIS — M6281 Muscle weakness (generalized): Secondary | ICD-10-CM | POA: Diagnosis not present

## 2022-10-20 DIAGNOSIS — M25471 Effusion, right ankle: Secondary | ICD-10-CM | POA: Diagnosis not present

## 2022-10-25 DIAGNOSIS — M5431 Sciatica, right side: Secondary | ICD-10-CM | POA: Diagnosis not present

## 2022-10-25 DIAGNOSIS — M6281 Muscle weakness (generalized): Secondary | ICD-10-CM | POA: Diagnosis not present

## 2022-10-25 DIAGNOSIS — M79604 Pain in right leg: Secondary | ICD-10-CM | POA: Diagnosis not present

## 2022-10-25 DIAGNOSIS — M25471 Effusion, right ankle: Secondary | ICD-10-CM | POA: Diagnosis not present

## 2022-10-27 DIAGNOSIS — M6281 Muscle weakness (generalized): Secondary | ICD-10-CM | POA: Diagnosis not present

## 2022-10-27 DIAGNOSIS — M5431 Sciatica, right side: Secondary | ICD-10-CM | POA: Diagnosis not present

## 2022-10-27 DIAGNOSIS — M25471 Effusion, right ankle: Secondary | ICD-10-CM | POA: Diagnosis not present

## 2022-10-27 DIAGNOSIS — M79604 Pain in right leg: Secondary | ICD-10-CM | POA: Diagnosis not present

## 2022-10-31 ENCOUNTER — Ambulatory Visit: Payer: Medicare Other | Admitting: Diagnostic Neuroimaging

## 2022-11-01 DIAGNOSIS — M25471 Effusion, right ankle: Secondary | ICD-10-CM | POA: Diagnosis not present

## 2022-11-01 DIAGNOSIS — M6281 Muscle weakness (generalized): Secondary | ICD-10-CM | POA: Diagnosis not present

## 2022-11-01 DIAGNOSIS — M5431 Sciatica, right side: Secondary | ICD-10-CM | POA: Diagnosis not present

## 2022-11-01 DIAGNOSIS — M79604 Pain in right leg: Secondary | ICD-10-CM | POA: Diagnosis not present

## 2022-11-03 DIAGNOSIS — M25471 Effusion, right ankle: Secondary | ICD-10-CM | POA: Diagnosis not present

## 2022-11-03 DIAGNOSIS — M79604 Pain in right leg: Secondary | ICD-10-CM | POA: Diagnosis not present

## 2022-11-03 DIAGNOSIS — M6281 Muscle weakness (generalized): Secondary | ICD-10-CM | POA: Diagnosis not present

## 2022-11-03 DIAGNOSIS — M5431 Sciatica, right side: Secondary | ICD-10-CM | POA: Diagnosis not present

## 2022-11-08 DIAGNOSIS — M6281 Muscle weakness (generalized): Secondary | ICD-10-CM | POA: Diagnosis not present

## 2022-11-08 DIAGNOSIS — M25471 Effusion, right ankle: Secondary | ICD-10-CM | POA: Diagnosis not present

## 2022-11-08 DIAGNOSIS — M5431 Sciatica, right side: Secondary | ICD-10-CM | POA: Diagnosis not present

## 2022-11-08 DIAGNOSIS — M79604 Pain in right leg: Secondary | ICD-10-CM | POA: Diagnosis not present

## 2022-11-10 DIAGNOSIS — M25471 Effusion, right ankle: Secondary | ICD-10-CM | POA: Diagnosis not present

## 2022-11-10 DIAGNOSIS — M5431 Sciatica, right side: Secondary | ICD-10-CM | POA: Diagnosis not present

## 2022-11-10 DIAGNOSIS — M79604 Pain in right leg: Secondary | ICD-10-CM | POA: Diagnosis not present

## 2022-11-10 DIAGNOSIS — M6281 Muscle weakness (generalized): Secondary | ICD-10-CM | POA: Diagnosis not present

## 2022-11-15 DIAGNOSIS — M25471 Effusion, right ankle: Secondary | ICD-10-CM | POA: Diagnosis not present

## 2022-11-15 DIAGNOSIS — M79604 Pain in right leg: Secondary | ICD-10-CM | POA: Diagnosis not present

## 2022-11-15 DIAGNOSIS — M5431 Sciatica, right side: Secondary | ICD-10-CM | POA: Diagnosis not present

## 2022-11-15 DIAGNOSIS — M6281 Muscle weakness (generalized): Secondary | ICD-10-CM | POA: Diagnosis not present

## 2022-11-17 DIAGNOSIS — M25471 Effusion, right ankle: Secondary | ICD-10-CM | POA: Diagnosis not present

## 2022-11-17 DIAGNOSIS — M5431 Sciatica, right side: Secondary | ICD-10-CM | POA: Diagnosis not present

## 2022-11-17 DIAGNOSIS — M6281 Muscle weakness (generalized): Secondary | ICD-10-CM | POA: Diagnosis not present

## 2022-11-17 DIAGNOSIS — M79604 Pain in right leg: Secondary | ICD-10-CM | POA: Diagnosis not present

## 2022-11-22 DIAGNOSIS — M5431 Sciatica, right side: Secondary | ICD-10-CM | POA: Diagnosis not present

## 2022-11-22 DIAGNOSIS — M25471 Effusion, right ankle: Secondary | ICD-10-CM | POA: Diagnosis not present

## 2022-11-22 DIAGNOSIS — M6281 Muscle weakness (generalized): Secondary | ICD-10-CM | POA: Diagnosis not present

## 2022-11-22 DIAGNOSIS — M79604 Pain in right leg: Secondary | ICD-10-CM | POA: Diagnosis not present

## 2022-11-24 DIAGNOSIS — M5431 Sciatica, right side: Secondary | ICD-10-CM | POA: Diagnosis not present

## 2022-11-24 DIAGNOSIS — M79604 Pain in right leg: Secondary | ICD-10-CM | POA: Diagnosis not present

## 2022-11-24 DIAGNOSIS — M25471 Effusion, right ankle: Secondary | ICD-10-CM | POA: Diagnosis not present

## 2022-11-24 DIAGNOSIS — M6281 Muscle weakness (generalized): Secondary | ICD-10-CM | POA: Diagnosis not present

## 2022-11-30 DIAGNOSIS — M5431 Sciatica, right side: Secondary | ICD-10-CM | POA: Diagnosis not present

## 2022-11-30 DIAGNOSIS — M79604 Pain in right leg: Secondary | ICD-10-CM | POA: Diagnosis not present

## 2022-11-30 DIAGNOSIS — M6281 Muscle weakness (generalized): Secondary | ICD-10-CM | POA: Diagnosis not present

## 2022-11-30 DIAGNOSIS — M25471 Effusion, right ankle: Secondary | ICD-10-CM | POA: Diagnosis not present

## 2022-12-06 DIAGNOSIS — M79604 Pain in right leg: Secondary | ICD-10-CM | POA: Diagnosis not present

## 2022-12-06 DIAGNOSIS — M6281 Muscle weakness (generalized): Secondary | ICD-10-CM | POA: Diagnosis not present

## 2022-12-06 DIAGNOSIS — M25471 Effusion, right ankle: Secondary | ICD-10-CM | POA: Diagnosis not present

## 2022-12-06 DIAGNOSIS — M5431 Sciatica, right side: Secondary | ICD-10-CM | POA: Diagnosis not present

## 2022-12-08 DIAGNOSIS — M5431 Sciatica, right side: Secondary | ICD-10-CM | POA: Diagnosis not present

## 2022-12-08 DIAGNOSIS — M79604 Pain in right leg: Secondary | ICD-10-CM | POA: Diagnosis not present

## 2022-12-08 DIAGNOSIS — M6281 Muscle weakness (generalized): Secondary | ICD-10-CM | POA: Diagnosis not present

## 2022-12-08 DIAGNOSIS — M25471 Effusion, right ankle: Secondary | ICD-10-CM | POA: Diagnosis not present

## 2022-12-09 DIAGNOSIS — K7460 Unspecified cirrhosis of liver: Secondary | ICD-10-CM | POA: Diagnosis not present

## 2022-12-09 DIAGNOSIS — E559 Vitamin D deficiency, unspecified: Secondary | ICD-10-CM | POA: Diagnosis not present

## 2022-12-09 DIAGNOSIS — I10 Essential (primary) hypertension: Secondary | ICD-10-CM | POA: Diagnosis not present

## 2022-12-09 DIAGNOSIS — D509 Iron deficiency anemia, unspecified: Secondary | ICD-10-CM | POA: Diagnosis not present

## 2022-12-09 DIAGNOSIS — Z6829 Body mass index (BMI) 29.0-29.9, adult: Secondary | ICD-10-CM | POA: Diagnosis not present

## 2022-12-09 DIAGNOSIS — E1129 Type 2 diabetes mellitus with other diabetic kidney complication: Secondary | ICD-10-CM | POA: Diagnosis not present

## 2022-12-09 DIAGNOSIS — E785 Hyperlipidemia, unspecified: Secondary | ICD-10-CM | POA: Diagnosis not present

## 2022-12-09 DIAGNOSIS — E039 Hypothyroidism, unspecified: Secondary | ICD-10-CM | POA: Diagnosis not present

## 2022-12-09 DIAGNOSIS — K7581 Nonalcoholic steatohepatitis (NASH): Secondary | ICD-10-CM | POA: Diagnosis not present

## 2022-12-09 DIAGNOSIS — R809 Proteinuria, unspecified: Secondary | ICD-10-CM | POA: Diagnosis not present

## 2022-12-09 DIAGNOSIS — M810 Age-related osteoporosis without current pathological fracture: Secondary | ICD-10-CM | POA: Diagnosis not present

## 2022-12-12 DIAGNOSIS — M79604 Pain in right leg: Secondary | ICD-10-CM | POA: Diagnosis not present

## 2022-12-12 DIAGNOSIS — M5431 Sciatica, right side: Secondary | ICD-10-CM | POA: Diagnosis not present

## 2022-12-12 DIAGNOSIS — M6281 Muscle weakness (generalized): Secondary | ICD-10-CM | POA: Diagnosis not present

## 2022-12-12 DIAGNOSIS — M25471 Effusion, right ankle: Secondary | ICD-10-CM | POA: Diagnosis not present

## 2022-12-15 DIAGNOSIS — M5431 Sciatica, right side: Secondary | ICD-10-CM | POA: Diagnosis not present

## 2022-12-15 DIAGNOSIS — M25471 Effusion, right ankle: Secondary | ICD-10-CM | POA: Diagnosis not present

## 2022-12-15 DIAGNOSIS — M79604 Pain in right leg: Secondary | ICD-10-CM | POA: Diagnosis not present

## 2022-12-15 DIAGNOSIS — M6281 Muscle weakness (generalized): Secondary | ICD-10-CM | POA: Diagnosis not present

## 2022-12-23 DIAGNOSIS — E785 Hyperlipidemia, unspecified: Secondary | ICD-10-CM | POA: Diagnosis not present

## 2022-12-23 DIAGNOSIS — E1129 Type 2 diabetes mellitus with other diabetic kidney complication: Secondary | ICD-10-CM | POA: Diagnosis not present

## 2022-12-30 ENCOUNTER — Observation Stay (HOSPITAL_COMMUNITY): Payer: Medicare Other

## 2022-12-30 ENCOUNTER — Other Ambulatory Visit: Payer: Self-pay

## 2022-12-30 ENCOUNTER — Emergency Department (HOSPITAL_COMMUNITY): Payer: Medicare Other

## 2022-12-30 ENCOUNTER — Observation Stay (HOSPITAL_COMMUNITY)
Admission: EM | Admit: 2022-12-30 | Discharge: 2023-01-03 | Disposition: A | Discharge status: Home / Self Care | Payer: Medicare Other | Attending: Internal Medicine | Admitting: Internal Medicine

## 2022-12-30 ENCOUNTER — Encounter (HOSPITAL_COMMUNITY): Payer: Self-pay

## 2022-12-30 DIAGNOSIS — I161 Hypertensive emergency: Secondary | ICD-10-CM | POA: Diagnosis not present

## 2022-12-30 DIAGNOSIS — R739 Hyperglycemia, unspecified: Secondary | ICD-10-CM | POA: Diagnosis not present

## 2022-12-30 DIAGNOSIS — I10 Essential (primary) hypertension: Secondary | ICD-10-CM | POA: Diagnosis not present

## 2022-12-30 DIAGNOSIS — R2681 Unsteadiness on feet: Secondary | ICD-10-CM | POA: Diagnosis not present

## 2022-12-30 DIAGNOSIS — K7290 Hepatic failure, unspecified without coma: Secondary | ICD-10-CM | POA: Diagnosis not present

## 2022-12-30 DIAGNOSIS — R7401 Elevation of levels of liver transaminase levels: Secondary | ICD-10-CM | POA: Insufficient documentation

## 2022-12-30 DIAGNOSIS — R000 Tachycardia, unspecified: Secondary | ICD-10-CM | POA: Diagnosis not present

## 2022-12-30 DIAGNOSIS — N179 Acute kidney failure, unspecified: Secondary | ICD-10-CM | POA: Diagnosis not present

## 2022-12-30 DIAGNOSIS — D696 Thrombocytopenia, unspecified: Secondary | ICD-10-CM | POA: Diagnosis not present

## 2022-12-30 DIAGNOSIS — E119 Type 2 diabetes mellitus without complications: Secondary | ICD-10-CM | POA: Insufficient documentation

## 2022-12-30 DIAGNOSIS — I5032 Chronic diastolic (congestive) heart failure: Secondary | ICD-10-CM | POA: Diagnosis present

## 2022-12-30 DIAGNOSIS — R188 Other ascites: Secondary | ICD-10-CM | POA: Diagnosis not present

## 2022-12-30 DIAGNOSIS — K7460 Unspecified cirrhosis of liver: Secondary | ICD-10-CM | POA: Diagnosis present

## 2022-12-30 DIAGNOSIS — K7682 Hepatic encephalopathy: Principal | ICD-10-CM | POA: Diagnosis present

## 2022-12-30 DIAGNOSIS — Z794 Long term (current) use of insulin: Secondary | ICD-10-CM | POA: Insufficient documentation

## 2022-12-30 DIAGNOSIS — I959 Hypotension, unspecified: Secondary | ICD-10-CM | POA: Diagnosis not present

## 2022-12-30 DIAGNOSIS — I110 Hypertensive heart disease with heart failure: Secondary | ICD-10-CM | POA: Diagnosis not present

## 2022-12-30 DIAGNOSIS — R4182 Altered mental status, unspecified: Secondary | ICD-10-CM | POA: Diagnosis not present

## 2022-12-30 DIAGNOSIS — R531 Weakness: Secondary | ICD-10-CM | POA: Diagnosis not present

## 2022-12-30 DIAGNOSIS — K7469 Other cirrhosis of liver: Secondary | ICD-10-CM

## 2022-12-30 DIAGNOSIS — R4781 Slurred speech: Secondary | ICD-10-CM | POA: Diagnosis not present

## 2022-12-30 DIAGNOSIS — Z79899 Other long term (current) drug therapy: Secondary | ICD-10-CM | POA: Diagnosis not present

## 2022-12-30 DIAGNOSIS — E1165 Type 2 diabetes mellitus with hyperglycemia: Secondary | ICD-10-CM

## 2022-12-30 DIAGNOSIS — R410 Disorientation, unspecified: Secondary | ICD-10-CM | POA: Diagnosis not present

## 2022-12-30 LAB — I-STAT VENOUS BLOOD GAS, ED
Acid-base deficit: 1 mmol/L (ref 0.0–2.0)
Bicarbonate: 20.2 mmol/L (ref 20.0–28.0)
Calcium, Ion: 1.04 mmol/L — ABNORMAL LOW (ref 1.15–1.40)
HCT: 33 % — ABNORMAL LOW (ref 36.0–46.0)
Hemoglobin: 11.2 g/dL — ABNORMAL LOW (ref 12.0–15.0)
O2 Saturation: 98 %
Potassium: 4.2 mmol/L (ref 3.5–5.1)
Sodium: 140 mmol/L (ref 135–145)
TCO2: 21 mmol/L — ABNORMAL LOW (ref 22–32)
pCO2, Ven: 22.6 mmHg — ABNORMAL LOW (ref 44–60)
pH, Ven: 7.559 — ABNORMAL HIGH (ref 7.25–7.43)
pO2, Ven: 90 mmHg — ABNORMAL HIGH (ref 32–45)

## 2022-12-30 LAB — COMPREHENSIVE METABOLIC PANEL
ALT: 29 U/L (ref 0–44)
AST: 38 U/L (ref 15–41)
Albumin: 3.9 g/dL (ref 3.5–5.0)
Alkaline Phosphatase: 58 U/L (ref 38–126)
Anion gap: 15 (ref 5–15)
BUN: 18 mg/dL (ref 8–23)
CO2: 18 mmol/L — ABNORMAL LOW (ref 22–32)
Calcium: 9.6 mg/dL (ref 8.9–10.3)
Chloride: 105 mmol/L (ref 98–111)
Creatinine, Ser: 1.34 mg/dL — ABNORMAL HIGH (ref 0.44–1.00)
GFR, Estimated: 42 mL/min — ABNORMAL LOW (ref >60)
Glucose, Bld: 294 mg/dL — ABNORMAL HIGH (ref 70–99)
Potassium: 4.3 mmol/L (ref 3.5–5.1)
Sodium: 138 mmol/L (ref 135–145)
Total Bilirubin: 1.8 mg/dL — ABNORMAL HIGH (ref 0.3–1.2)
Total Protein: 7 g/dL (ref 6.5–8.1)

## 2022-12-30 LAB — CBC WITH DIFFERENTIAL/PLATELET
Abs Immature Granulocytes: 0.02 10*3/uL (ref 0.00–0.07)
Basophils Absolute: 0 10*3/uL (ref 0.0–0.1)
Basophils Relative: 1 %
Eosinophils Absolute: 0 10*3/uL (ref 0.0–0.5)
Eosinophils Relative: 1 %
HCT: 40.4 % (ref 36.0–46.0)
Hemoglobin: 13.5 g/dL (ref 12.0–15.0)
Immature Granulocytes: 0 %
Lymphocytes Relative: 15 %
Lymphs Abs: 0.9 10*3/uL (ref 0.7–4.0)
MCH: 32.7 pg (ref 26.0–34.0)
MCHC: 33.4 g/dL (ref 30.0–36.0)
MCV: 97.8 fL (ref 80.0–100.0)
Monocytes Absolute: 0.4 10*3/uL (ref 0.1–1.0)
Monocytes Relative: 6 %
Neutro Abs: 4.7 10*3/uL (ref 1.7–7.7)
Neutrophils Relative %: 77 %
Platelets: 134 10*3/uL — ABNORMAL LOW (ref 150–400)
RBC: 4.13 MIL/uL (ref 3.87–5.11)
RDW: 13.8 % (ref 11.5–15.5)
WBC: 6.1 10*3/uL (ref 4.0–10.5)
nRBC: 0 % (ref 0.0–0.2)

## 2022-12-30 LAB — CBC
HCT: 35.4 % — ABNORMAL LOW (ref 36.0–46.0)
Hemoglobin: 11.6 g/dL — ABNORMAL LOW (ref 12.0–15.0)
MCH: 31.6 pg (ref 26.0–34.0)
MCHC: 32.8 g/dL (ref 30.0–36.0)
MCV: 96.5 fL (ref 80.0–100.0)
Platelets: 119 10*3/uL — ABNORMAL LOW (ref 150–400)
RBC: 3.67 MIL/uL — ABNORMAL LOW (ref 3.87–5.11)
RDW: 13.8 % (ref 11.5–15.5)
WBC: 5.6 10*3/uL (ref 4.0–10.5)
nRBC: 0 % (ref 0.0–0.2)

## 2022-12-30 LAB — LACTIC ACID, PLASMA
Lactic Acid, Venous: 4.1 mmol/L (ref 0.5–1.9)
Lactic Acid, Venous: 1.9 mmol/L (ref 0.5–1.9)

## 2022-12-30 LAB — PROTIME-INR
INR: 1.3 — ABNORMAL HIGH (ref 0.8–1.2)
Prothrombin Time: 16.5 seconds — ABNORMAL HIGH (ref 11.4–15.2)

## 2022-12-30 LAB — ETHANOL: Alcohol, Ethyl (B): <10 mg/dL (ref <10)

## 2022-12-30 LAB — HEMOGLOBIN A1C
Hgb A1c MFr Bld: 7.9 % — ABNORMAL HIGH (ref 4.8–5.6)
Mean Plasma Glucose: 180.03 mg/dL

## 2022-12-30 LAB — MAGNESIUM: Magnesium: 2.5 mg/dL — ABNORMAL HIGH (ref 1.7–2.4)

## 2022-12-30 LAB — CREATININE, SERUM
Creatinine, Ser: 1.16 mg/dL — ABNORMAL HIGH (ref 0.44–1.00)
GFR, Estimated: 50 mL/min — ABNORMAL LOW (ref >60)

## 2022-12-30 LAB — AMMONIA: Ammonia: 77 umol/L — ABNORMAL HIGH (ref 9–35)

## 2022-12-30 LAB — CBG MONITORING, ED
Glucose-Capillary: 273 mg/dL — ABNORMAL HIGH (ref 70–99)
Glucose-Capillary: 174 mg/dL — ABNORMAL HIGH (ref 70–99)

## 2022-12-30 MED ORDER — LACTULOSE 10 GM/15ML PO SOLN
30.0000 g | Freq: Three times a day (TID) | ORAL | Status: DC
  Administered 2022-12-31 (×2): 30 g via ORAL
  Filled 2022-12-30: qty 45
  Filled 2022-12-30: qty 60

## 2022-12-30 MED ORDER — FUROSEMIDE 40 MG PO TABS
40.0000 mg | ORAL_TABLET | Freq: Every day | ORAL | Status: DC
  Administered 2022-12-30: 40 mg via ORAL
  Filled 2022-12-30: qty 2

## 2022-12-30 MED ORDER — LACTULOSE 10 GM/15ML PO SOLN
20.0000 g | Freq: Once | ORAL | Status: AC
  Administered 2022-12-30: 20 g via ORAL
  Filled 2022-12-30: qty 30

## 2022-12-30 MED ORDER — ENOXAPARIN SODIUM 40 MG/0.4ML IJ SOSY
40.0000 mg | PREFILLED_SYRINGE | INTRAMUSCULAR | Status: DC
  Administered 2022-12-30 – 2023-01-02 (×4): 40 mg via SUBCUTANEOUS
  Filled 2022-12-30 (×4): qty 0.4

## 2022-12-30 MED ORDER — LACTATED RINGERS IV BOLUS
1000.0000 mL | Freq: Once | INTRAVENOUS | Status: AC
  Administered 2022-12-30: 1000 mL via INTRAVENOUS

## 2022-12-30 MED ORDER — INSULIN GLARGINE-YFGN 100 UNIT/ML ~~LOC~~ SOLN
26.0000 [IU] | Freq: Every day | SUBCUTANEOUS | Status: DC
  Administered 2022-12-31 – 2023-01-02 (×3): 26 [IU] via SUBCUTANEOUS
  Filled 2022-12-30 (×3): qty 0.26

## 2022-12-30 MED ORDER — SPIRONOLACTONE 25 MG PO TABS
50.0000 mg | ORAL_TABLET | Freq: Two times a day (BID) | ORAL | Status: DC
  Administered 2022-12-31: 50 mg via ORAL
  Filled 2022-12-30: qty 4

## 2022-12-30 MED ORDER — INSULIN GLARGINE-YFGN 100 UNIT/ML ~~LOC~~ SOLN: 26.0000 [IU] | Freq: Every day | SUBCUTANEOUS | Status: DC

## 2022-12-30 MED ORDER — INSULIN ASPART 100 UNIT/ML IJ SOLN
0.0000 [IU] | Freq: Three times a day (TID) | INTRAMUSCULAR | Status: DC
  Administered 2022-12-31 – 2023-01-01 (×5): 5 [IU] via SUBCUTANEOUS
  Administered 2023-01-01: 2 [IU] via SUBCUTANEOUS
  Administered 2023-01-02 (×3): 5 [IU] via SUBCUTANEOUS
  Administered 2023-01-03 (×2): 3 [IU] via SUBCUTANEOUS

## 2022-12-30 NOTE — ED Triage Notes (Signed)
Pt BIB [facility] form PCP for Generalized weakness and AMS from baseline.  Can normally drive herself but today a friend took her to doctor and she was very confused about directions. LKW last night.  Pt has some tremors that are not normal  170/75 97% RA HR 105 CBG 345

## 2022-12-30 NOTE — ED Provider Notes (Signed)
[name]   CSN: [id number] Arrival date & time: 12/30/22  1508     History {Add pertinent medical, surgical, social history, OB history to HPI:1} Chief Complaint  Patient presents with   AMS    [name] is a 72 y.o. female with [name] cirrhosis history of hepatic encephalopathy, HTN, diabetes, IDA, HLD, chronic diastolic heart failure, who presents accompanied by her friend [name] who she lives with and her niece. They report LKN 2130 last night and patient woke up this morning somnolent and confused.  She has not been acting normally and has been walking while holding onto walls.  She has not had any falls or trauma. Patient does not complain of any headache, visual changes, trouble swallowing, trouble speaking, however her niece notes that her speech seems slurred.  [name] drove her to an endocrine appointment today but patient could not tell him to get there.  Is very unusual for her.  Patient reports that she does not remember what happened today and states that she "just went to sleep."  She is A&O x 3 but not to situation.  With EMS 170/75 97% RA HR 105 CBG 345    HPI     Home Medications Prior to Admission medications   Medication Sig Start Date End Date Taking? Authorizing Provider  albuterol (VENTOLIN HFA) 108 (90 Base) MCG/ACT inhaler Inhale into the lungs. 11/24/20   [provider]  escitalopram (LEXAPRO) 10 MG tablet Take 10 mg by mouth as needed.    [provider]  furosemide (LASIX) 40 MG tablet Take 1 tablet by mouth daily.    [provider]  insulin aspart (NOVOLOG) 100 UNIT/ML FlexPen Inject 30 Units into the skin 2 (two) times daily.    [provider]  lactulose (CHRONULAC) 10 GM/15ML solution 30 ML THREE TIMES A DAY 12/29/21   [name], MD  metFORMIN (GLUCOPHAGE-XR) 500 MG 24 hr tablet Take 500 mg by mouth 2 (two) times daily with a meal. 04/14/18    [provider]  ondansetron (ZOFRAN) 4 MG tablet Take 1 tablet (4 mg total) by mouth every 4 (four) hours as needed for nausea. 01/18/22   [name], NP  OZEMPIC, 0.25 OR 0.5 MG/DOSE, 2 MG/3ML SOPN Inject into the skin.    [provider]  pregabalin (LYRICA) 75 MG capsule Take 75 mg by mouth at bedtime. 03/28/18   [provider]  rOPINIRole (REQUIP) 2 MG tablet Take 1 tablet by mouth at bedtime. 06/01/18   [provider]  spironolactone (ALDACTONE) 50 MG tablet Take 1 tablet (50 mg total) by mouth daily. 01/04/21   Mosher, [name], PA-C  TOUJEO MAX SOLOSTAR 300 UNIT/ML Solostar Pen 40 Units.    [provider]  valACYclovir (VALTREX) 1000 MG tablet Take 1,000 mg by mouth 2 (two) times daily. 05/01/20   [provider]  Vitamin D, Ergocalciferol, (DRISDOL) 1.25 MG (50000 UT) CAPS capsule Take 1 capsule by mouth once a week.    [provider]      Allergies    Patient has no known allergies.    Review of Systems   Review of Systems Review of systems {pos/neg:18640::"Negative","Positive"} for ***.  A 10 point review of systems was performed and is negative unless otherwise reported in HPI.  Physical Exam Updated Vital Signs BP (!) 176/72   Pulse 87   Temp 98.6 F (37 C) (Oral)  Resp 20   SpO2 99%  Physical Exam General: Normal appearing {Desc; female/female:11659}, lying in bed.  HEENT: PERRLA, Sclera anicteric, MMM, trachea midline.  Cardiology: RRR, no murmurs/rubs/gallops. BL radial and DP pulses equal bilaterally.  Resp: Normal respiratory rate and effort. CTAB, no wheezes, rhonchi, crackles.  Abd: Soft, non-tender, non-distended. No rebound tenderness or guarding.  GU: Deferred. MSK: No peripheral edema or signs of trauma. Extremities without deformity or TTP. No cyanosis or clubbing. Skin: warm, dry. No rashes or lesions. Back: No CVA tenderness Neuro: A&Ox4, CNs II-XII grossly intact. MAEs. Sensation grossly  intact.  Psych: Normal mood and affect.   ED Results / Procedures / Treatments   Labs (all labs ordered are listed, but only abnormal results are displayed) Labs Reviewed  CBC WITH DIFFERENTIAL/PLATELET - Abnormal; Notable for the following components:      Result Value   Platelets 134 (*)    All other components within normal limits  COMPREHENSIVE METABOLIC PANEL - Abnormal; Notable for the following components:   CO2 18 (*)    Glucose, Bld 294 (*)    Creatinine, Ser 1.34 (*)    Total Bilirubin 1.8 (*)    GFR, Estimated 42 (*)    All other components within normal limits  LACTIC ACID, PLASMA - Abnormal; Notable for the following components:   Lactic Acid, Venous 4.1 (*)    All other components within normal limits  AMMONIA - Abnormal; Notable for the following components:   Ammonia 77 (*)    All other components within normal limits  MAGNESIUM - Abnormal; Notable for the following components:   Magnesium 2.5 (*)    All other components within normal limits  CBG MONITORING, ED - Abnormal; Notable for the following components:   Glucose-Capillary 273 (*)    All other components within normal limits  I-STAT VENOUS BLOOD GAS, ED - Abnormal; Notable for the following components:   pH, Ven 7.559 (*)    pCO2, Ven 22.6 (*)    pO2, Ven 90 (*)    TCO2 21 (*)    Calcium, Ion 1.04 (*)    HCT 33.0 (*)    Hemoglobin 11.2 (*)    All other components within normal limits  ETHANOL  LACTIC ACID, PLASMA  URINALYSIS, ROUTINE W REFLEX MICROSCOPIC  RAPID URINE DRUG SCREEN, HOSP PERFORMED    EKG EKG Interpretation  Date/Time:  Friday December 30 2022 15:17:56 EDT Ventricular Rate:  92 PR Interval:  173 QRS Duration: 88 QT Interval:  384 QTC Calculation: 475 R Axis:   19 Text Interpretation: Sinus rhythm Probable anterior infarct, old Confirmed by Vivi Barrack [phone]) on 12/30/2022 5:03:53 PM  Radiology No results found.  Procedures Procedures  {Document cardiac monitor, telemetry  assessment procedure when appropriate:1}  Medications Ordered in ED Medications  lactated ringers bolus 1,000 mL (has no administration in time range)  lactulose (CHRONULAC) 10 GM/15ML solution 20 g (has no administration in time range)    ED Course/ Medical Decision Making/ A&P                          Medical Decision Making Amount and/or Complexity of Data Reviewed Labs: ordered. Decision-making details documented in ED Course.  Risk Prescription drug management. Decision regarding hospitalization.    This patient presents to the ED for concern of ***, this involves an extensive number of treatment options, and is a complaint that carries with it a high risk of complications and morbidity.  I considered the following differential and admission for this acute, potentially life threatening condition.   MDM:    ***  Clinical Course as of 12/30/22 1827  Fri Dec 30, 2022  1703 Glucose-Capillary(!): 273 [HN]  1746 Alcohol, Ethyl (B): <10 [HN]  1746 Creatinine(!): 1.34 Up from 0.8 four months ago [HN]  1746 Glucose(!): 294 [HN]  1746 Magnesium(!): 2.5 [HN]  1746 Glucose-Capillary(!): 273 [HN]  1746 WBC: 6.1 [HN]  1746 Hemoglobin: 13.5 [HN]  1746 Platelets(!): 134 [HN]  1821 pH, Ven(!): 7.559 [HN]  1822 pCO2, Ven(!): 22.6 [HN]  1822 Lactic Acid, Venous(!!): 4.1 Will give fluids [HN]  1822 Ammonia(!): 77 Likely hepatic encephalopathy, will give lactulose [HN]  1824 Alcohol, Ethyl (B): <10 [HN]  1824 Consulted for admission [HN]    Clinical Course User Index [HN] Loetta Rough, MD    Labs: I Ordered, and personally interpreted labs.  The pertinent results include:  ***  Imaging Studies ordered: I ordered imaging studies including *** I independently visualized and interpreted imaging. I agree with the radiologist interpretation  Additional history obtained from ***.  External records from outside source obtained and reviewed including ***  Cardiac  Monitoring: The patient was maintained on a cardiac monitor.  I personally viewed and interpreted the cardiac monitored which showed an underlying rhythm of: ***  Reevaluation: After the interventions noted above, I reevaluated the patient and found that they have :{resolved/improved/worsened:23923::"improved"}  Social Determinants of Health: ***  Disposition:  ***  Co morbidities that complicate the patient evaluation  Past Medical History:  Diagnosis Date   Anemia 07/02/2018   Aortic stenosis, mild 09/03/2018   Cirrhosis of liver without ascites ([facility]) 07/03/2019   Diabetes mellitus due to underlying condition with unspecified complications ([facility]) 07/02/2018   Diabetes mellitus without complication ([facility])    Essential hypertension 07/02/2018   Hematochezia 06/20/2016   History of colonic polyps 02/18/2019   Hyperlipidemia    Hypertension    Iron deficiency anemia 06/20/2016   Iron deficiency anemia, unspecified    RUQ pain 06/20/2016   Tightness in chest 09/03/2018     Medicines Meds ordered this encounter  Medications   lactated ringers bolus 1,000 mL   lactulose (CHRONULAC) 10 GM/15ML solution 20 g    I have reviewed the patients home medicines and have made adjustments as needed  Problem List / ED Course: Problem List Items Addressed This Visit   None Visit Diagnoses     Hepatic encephalopathy ([facility]CC)    -  Primary            {Document critical care time when appropriate:1} {Document review of labs and clinical decision tools ie heart score, Chads2Vasc2 etc:1}  {Document your independent review of radiology images, and any outside records:1} {Document your discussion with family members, caretakers, and with consultants:1} {Document social determinants of health affecting pt's care:1} {Document your decision making why or why not admission, treatments were needed:1}  This Curry was created using dictation software, which may contain spelling or grammatical  errors.

## 2022-12-30 NOTE — H&P (Signed)
Date: 12/30/2022         Patient Name:  [name] MRN: [id number]  DOB: Sep 15, 1950 Age / Sex: 72 y.o., female   PCP: Paulina Fusi, MD         Medical Service: Internal Medicine Teaching Service         Attending Physician: Dr. Mercie Eon, MD    First Contact: Dr. Sherrilee Gilles Pager: [phone]  Second Contact: Dr. Ned Card Pager: [phone]       After Hours (After 5p/  First Contact Pager: [phone]  weekends / holidays): Second Contact Pager: [phone]   Chief Concern: Confusion, difficulty walking  History of Present Illness: This is a 72 year old female with a history of nonalcoholic cirrhosis, diabetes, chronic diastolic heart failure.  She is accompanied by her niece and a close friend.  For the last 2 to 3 days she has not felt quite herself, described as not thinking straight, unsteadiness on her feet, some difficulty sleeping.  Today when she woke up she felt especially unsteady on her feet.  Her friend notes that she was using walls to steady herself, which is very unusual for this person.  She had a routine appointment with her endocrinologist today and got confused about how to get there, which is also very unusual for her.  At that appointment, she was noted to be pretty severely hypertensive and evaluation in the hospital was recommended.  She was transported from her doctor's office to [facility] ED by ambulance.  Review of Systems  Constitutional:  Negative for chills, diaphoresis and fever.  Respiratory:  Negative for cough and shortness of breath.   Cardiovascular:  Positive for orthopnea. Negative for chest pain, palpitations and leg swelling.  Gastrointestinal:  Positive for abdominal pain (Right side) and vomiting (1 episode 3 days ago). Negative for blood in stool and diarrhea.  Genitourinary:  Negative for dysuria.  Skin:  Negative for rash.  Neurological:  Positive for tremors. Negative for dizziness and headaches.   In the ED she was afebrile and  hemodynamically stable.  CT head was unremarkable.  She was treated empirically with lactulose for hepatic encephalopathy and I was consulted for admission.  Allergies: No Known Allergies  Past Medical History: Complex medical history, notable for nonalcoholic cirrhosis, portal hypertensive gastropathy, chronic diastolic heart failure, diabetes on chronic insulin.  Medications: Albuterol Furosemide 40 mg daily Glargine 40 units daily ?  Aspart 30 units twice daily Metformin 500 mg twice daily Ozempic 1 mg weekly Ropinirole 2 mg nightly Spironolactone 50 mg twice daily Valacyclovir 1000 mg daily Vitamin D 50,000 units weekly  Reports decreased adherence to lactulose over the last few days.  Niece states that she did not take any of her prescribed medicines this morning.  Surgical History: No recent surgeries or medical procedures, no history of paracentesis. EGD in 2022 showed portal hypertensive gastropathy and AVMs in jejunum  Family History:  Reviewed, no pertinent updates.  Social History:  Lives at home.  Enjoys spending time with her friend [name].  Enjoys good functional status.  Does not drink, smoke, or use drugs.  Physical Exam: Blood pressure (!) 158/65, pulse 89, temperature 98.6 F (37 C), temperature source Oral, resp. rate 18, weight 73.4 kg, SpO2 97 %.  Somewhat restless appearing elderly female lying in hospital stretcher Oral mucous membranes are dry Heart rate is normal, rhythm is regular, radial pulses are strong, systolic murmur appreciated, no appreciable JVP Breathing is regular and unlabored on room air,  scant bibasilar crackles Skin is warm and dry without apparent rashes on exposed areas Abdomen is soft, there is some shifting dullness, some mild tenderness over the right upper and lower quadrants, no peritoneal signs Alert and oriented, no focal cranial nerve deficits, strength is equal in bilateral upper and lower extremities, hand flapping tremor  noted, no ankle clonus, repetitive speech  EKG:  Sinus rhythm, no acute repolarization abnormalities  Labs: 138 Potassium 4.3 Bicarb 18 Glucose 294 BUN/creatinine 18/1.34 Magnesium 2.5  Ammonia 77 Alk phos 58 AST/ALT 38/29 T. bili 1.8  Venous lactate 4.1  WBC 6.1 Hemoglobin 13.5 Platelets 134  Images and other studies: CT head without acute intracranial abnormality  Assessment & Plan:  [name] is a 72 y.o. with nonalcoholic cirrhosis who presents with steadily worsening confusion and gait abnormality with nonfocal neurologic exam, suggestive of hepatic encephalopathy.  Principal Problem:   Hepatic encephalopathy (HCC) Active Problems:   Chronic diastolic heart failure (HCC)   Cirrhosis, nonalcoholic (HCC)   Thrombocytopenia (HCC)   Diabetes mellitus with hyperglycemia, with long-term current use of insulin (HCC)   AKI (acute kidney injury) (HCC)  Hepatic encephalopathy Given tempo and no obvious signs of CNS or infectious etiology, hepatic encephalopathy is leading item on differential.  Notably, she has been nonadherent to lactulose over the last few days.  Will restart this, titrating to effect overnight.  Neurologic exam is nonfocal and head CT is normal, can defer further imaging at this time.  Low suspicion for infection, she is without an obvious source.  I thought about SBP given exam findings suggestive of ascites but her relative lack of pain makes this less likely.  Deferring antibiotics.  Will evaluate with ultrasound, if she has a tappable pocket it may be worthwhile to do paracentesis. - Lactulose titrated to effect - Follow-up abdominal ultrasound  Nonalcoholic cirrhosis Complications include portal hypertensive gastropathy.  EGD in 2022 was without varices.  Evaluating for ascites.  No history of paracentesis.  Mild thrombocytopenia.  Continuing home MRA. - Spironolactone 50 mg twice daily  Chronic diastolic heart failure Systolic heart  murmur Appears to be well compensated currently.  She does have a systolic murmur and apparently some trace to mild mitral regurgitation on her echo from 2021.  At any rate I do not think this is contributing to her current clinical syndrome. - Furosemide 40 mg daily  Diabetes on chronic insulin, complicated by hypoglycemia Follows with endocrinology.  On pretty large doses of insulin at home.  Hyperglycemic on admission.  Will start two thirds of basal dose and SSI. - Glargine 26 units daily - Moderate SSI  AKI Elevated venous lactate Creatinine up by >50%.  She looks a little bit dry on exam.  Venous lactate is elevated beyond what I would expect for how well-appearing she is on exam.  Suspect she is probably dehydrated, and she is status post a bolus ordered by ED physician.  Low suspicion for HRS at this point but will keep it on the differential. - UA pending  Diet: Heart healthy/carb modified IVF: Status post 1000 cc LR VTE: enoxaparin (LOVENOX) injection 40 mg Start: 12/30/22 2045 Code: Full Surrogate: Niece, Birdie Hopes, [phone]  Admit patient to Observation with expected length of stay less than 2 midnights.  Signed: Marrianne Mood MD 12/30/2022, 9:48 PM  Pager: [phone] After 5pm on weekdays and 1pm on weekends: [phone]

## 2022-12-30 NOTE — ED Notes (Signed)
Patient transported to CT 

## 2022-12-30 NOTE — Hospital Course (Signed)
[name] is a 72 yo female with NASH cirrhosis who presents with somnolence and confusion with tremor.   Concern for non compliance with lactulose. LKW last night. Holding onto walls, can't remember things.   06/08 Patient is AxOx3. She reports that she is feeling awful and she is tried. She did not sleep well last night. She was unsure of what kept her up but she said being here has made it hard for her to sleep. She has a friend named ralph who she lives wit her. She has not has any bowel movements. She reports that her last BM was 3 day ago. Came to the hospital because she could not walk. She said before coming in she did have high sugars. Patient has not been taking her lactulose.  She would like [place] to update [place].   06/10 She states that she is nauseated. She does note that she is feeling more bloated and has not had a bowel movement. She denies any confusion. She reports some soreness to her abdomen. She notes that normally she is able to get good bowel movements, but is not getting any here.

## 2022-12-30 NOTE — ED Notes (Signed)
PT niece received alert on cell phone that patients glucose level was 290

## 2022-12-31 ENCOUNTER — Encounter (HOSPITAL_COMMUNITY): Payer: Self-pay | Admitting: Internal Medicine

## 2022-12-31 DIAGNOSIS — R011 Cardiac murmur, unspecified: Secondary | ICD-10-CM | POA: Diagnosis not present

## 2022-12-31 DIAGNOSIS — I5032 Chronic diastolic (congestive) heart failure: Secondary | ICD-10-CM

## 2022-12-31 DIAGNOSIS — K7469 Other cirrhosis of liver: Secondary | ICD-10-CM

## 2022-12-31 DIAGNOSIS — K7581 Nonalcoholic steatohepatitis (NASH): Secondary | ICD-10-CM

## 2022-12-31 DIAGNOSIS — K7682 Hepatic encephalopathy: Secondary | ICD-10-CM

## 2022-12-31 LAB — GLUCOSE, CAPILLARY
Glucose-Capillary: 203 mg/dL — ABNORMAL HIGH (ref 70–99)
Glucose-Capillary: 207 mg/dL — ABNORMAL HIGH (ref 70–99)
Glucose-Capillary: 238 mg/dL — ABNORMAL HIGH (ref 70–99)
Glucose-Capillary: 178 mg/dL — ABNORMAL HIGH (ref 70–99)

## 2022-12-31 LAB — URINALYSIS, ROUTINE W REFLEX MICROSCOPIC
Bacteria, UA: NONE SEEN
Bilirubin Urine: NEGATIVE
Glucose, UA: >=500 mg/dL — AB
Hgb urine dipstick: NEGATIVE
Ketones, ur: NEGATIVE mg/dL
Leukocytes,Ua: NEGATIVE
Nitrite: NEGATIVE
Protein, ur: NEGATIVE mg/dL
Specific Gravity, Urine: 1.015 (ref 1.005–1.030)
pH: 6 (ref 5.0–8.0)

## 2022-12-31 LAB — RAPID URINE DRUG SCREEN, HOSP PERFORMED
Amphetamines: NOT DETECTED
Barbiturates: NOT DETECTED
Benzodiazepines: NOT DETECTED
Cocaine: NOT DETECTED
Opiates: NOT DETECTED
Tetrahydrocannabinol: NOT DETECTED

## 2022-12-31 LAB — COMPREHENSIVE METABOLIC PANEL WITH GFR
ALT: 26 U/L (ref 0–44)
AST: 32 U/L (ref 15–41)
Albumin: 3.5 g/dL (ref 3.5–5.0)
Alkaline Phosphatase: 48 U/L (ref 38–126)
Anion gap: 12 (ref 5–15)
BUN: 16 mg/dL (ref 8–23)
CO2: 21 mmol/L — ABNORMAL LOW (ref 22–32)
Calcium: 9.3 mg/dL (ref 8.9–10.3)
Chloride: 105 mmol/L (ref 98–111)
Creatinine, Ser: 1.23 mg/dL — ABNORMAL HIGH (ref 0.44–1.00)
GFR, Estimated: 46 mL/min — ABNORMAL LOW
Glucose, Bld: 213 mg/dL — ABNORMAL HIGH (ref 70–99)
Potassium: 3.3 mmol/L — ABNORMAL LOW (ref 3.5–5.1)
Sodium: 138 mmol/L (ref 135–145)
Total Bilirubin: 2.2 mg/dL — ABNORMAL HIGH (ref 0.3–1.2)
Total Protein: 6.4 g/dL — ABNORMAL LOW (ref 6.5–8.1)

## 2022-12-31 LAB — LACTIC ACID, PLASMA
Lactic Acid, Venous: 1.6 mmol/L (ref 0.5–1.9)
Lactic Acid, Venous: 1.7 mmol/L (ref 0.5–1.9)

## 2022-12-31 MED ORDER — ROPINIROLE HCL 1 MG PO TABS
2.0000 mg | ORAL_TABLET | Freq: Every day | ORAL | Status: DC
  Administered 2022-12-31 – 2023-01-02 (×3): 2 mg via ORAL
  Filled 2022-12-31 (×3): qty 2

## 2022-12-31 MED ORDER — ONDANSETRON HCL 4 MG/2ML IJ SOLN
4.0000 mg | Freq: Once | INTRAMUSCULAR | Status: AC
  Administered 2022-12-31: 4 mg via INTRAVENOUS
  Filled 2022-12-31: qty 2

## 2022-12-31 MED ORDER — ACETAMINOPHEN 325 MG PO TABS
650.0000 mg | ORAL_TABLET | Freq: Four times a day (QID) | ORAL | Status: DC | PRN
  Administered 2022-12-31: 650 mg via ORAL
  Filled 2022-12-31: qty 2

## 2022-12-31 MED ORDER — METOCLOPRAMIDE HCL 5 MG/ML IJ SOLN
10.0000 mg | Freq: Once | INTRAMUSCULAR | Status: AC
  Administered 2022-12-31: 10 mg via INTRAVENOUS
  Filled 2022-12-31: qty 2

## 2022-12-31 MED ORDER — ORAL CARE MOUTH RINSE: 15.0000 mL | OROMUCOSAL | Status: DC | PRN

## 2022-12-31 MED ORDER — LACTULOSE 10 GM/15ML PO SOLN
30.0000 g | ORAL | Status: DC
  Administered 2022-12-31: 30 g via ORAL
  Filled 2022-12-31: qty 45

## 2022-12-31 MED ORDER — LACTULOSE 10 GM/15ML PO SOLN
30.0000 g | Freq: Once | ORAL | Status: AC
  Administered 2022-12-31: 30 g via ORAL
  Filled 2022-12-31: qty 45

## 2022-12-31 MED ORDER — ACETAMINOPHEN 500 MG PO TABS: 500.0000 mg | ORAL_TABLET | Freq: Four times a day (QID) | ORAL | Status: DC | PRN

## 2022-12-31 MED ORDER — LACTULOSE 10 GM/15ML PO SOLN
30.0000 g | Freq: Three times a day (TID) | ORAL | Status: DC
  Administered 2022-12-31 – 2023-01-02 (×6): 30 g via ORAL
  Filled 2022-12-31 (×6): qty 45

## 2022-12-31 NOTE — ED Notes (Signed)
ED TO INPATIENT HANDOFF REPORT  ED Nurse Name and Phone #:  Saveah Bahar [phone]  S Name/Age/Gender [name] 72 y.o. female Room/Bed: TRACC/TRACC  Code Status   Code Status: Full Code  Home/SNF/Other Home Patient oriented to: self, place, time, and situation. With some bizarre responses at times.  Is this baseline? Yes   Triage Complete: Triage complete  Chief Complaint Hepatic encephalopathy (HCC) [K76.82]  Triage Note Pt BIB [facility] form PCP for Generalized weakness and AMS from baseline.  Can normally drive herself but today a friend took her to doctor and she was very confused about directions. LKW last night.  Pt has some tremors that are not normal  170/75 97% RA HR 105 CBG 345   Allergies No Known Allergies  Level of Care/Admitting Diagnosis ED Disposition     ED Disposition  Admit   Condition  --   Comment  Hospital Area: [facility] [[id number]]  Level of Care: Med-Surg [16]  May place patient in observation at [facility] or [facility] if equivalent level of care is available:: No  Covid Evaluation: Asymptomatic - no recent exposure (last 10 days) testing not required  Diagnosis: Hepatic encephalopathy (HCC) [572.2.ICD-9-CM]  Admitting Physician: Mercie Eon [[id number]]  Attending Physician: Mercie Eon [[id number]]          B Medical/Surgery History Past Medical History:  Diagnosis Date   Anemia 07/02/2018   Aortic stenosis, mild 09/03/2018   Cirrhosis of liver without ascites (HCC) 07/03/2019   Diabetes mellitus due to underlying condition with unspecified complications (HCC) 07/02/2018   Diabetes mellitus without complication (HCC)    Essential hypertension 07/02/2018   Hematochezia 06/20/2016   History of colonic polyps 02/18/2019   Hyperlipidemia    Hypertension    Iron deficiency anemia 06/20/2016   Iron deficiency anemia, unspecified    RUQ pain 06/20/2016   Tightness in chest 09/03/2018   Past Surgical History:  Procedure  Laterality Date   ABDOMINAL HYSTERECTOMY     BACK SURGERY       A IV Location/Drains/Wounds Patient Lines/Drains/Airways Status     Active Line/Drains/Airways     Name Placement date Placement time Site Days   Peripheral IV 12/30/22 20 G Anterior;Left Hand 12/30/22  1820  Hand  1            Intake/Output Last 24 hours No intake or output data in the 24 hours ending 12/31/22 0158  Labs/Imaging Results for orders placed or performed during the hospital encounter of 12/30/22 (from the past 48 hour(s))  POC CBG, ED     Status: Abnormal   Collection Time: 12/30/22  3:30 PM  Result Value Ref Range   Glucose-Capillary 273 (H) 70 - 99 mg/dL    Comment: Glucose reference range applies only to samples taken after fasting for at least 8 hours.  CBC with Differential     Status: Abnormal   Collection Time: 12/30/22  4:18 PM  Result Value Ref Range   WBC 6.1 4.0 - 10.5 K/uL   RBC 4.13 3.87 - 5.11 MIL/uL   Hemoglobin 13.5 12.0 - 15.0 g/dL   HCT 13.2 44.0 - 10.2 %   MCV 97.8 80.0 - 100.0 fL   MCH 32.7 26.0 - 34.0 pg   MCHC 33.4 30.0 - 36.0 g/dL   RDW 72.5 36.6 - 44.0 %   Platelets 134 (L) 150 - 400 K/uL   nRBC 0.0 0.0 - 0.2 %   Neutrophils Relative % 77 %  Neutro Abs 4.7 1.7 - 7.7 K/uL   Lymphocytes Relative 15 %   Lymphs Abs 0.9 0.7 - 4.0 K/uL   Monocytes Relative 6 %   Monocytes Absolute 0.4 0.1 - 1.0 K/uL   Eosinophils Relative 1 %   Eosinophils Absolute 0.0 0.0 - 0.5 K/uL   Basophils Relative 1 %   Basophils Absolute 0.0 0.0 - 0.1 K/uL   Immature Granulocytes 0 %   Abs Immature Granulocytes 0.02 0.00 - 0.07 K/uL    Comment: Performed at [facility] Lab, [address] [place], [place] [address]  Comprehensive metabolic panel     Status: Abnormal   Collection Time: 12/30/22  4:18 PM  Result Value Ref Range   Sodium 138 135 - 145 mmol/L   Potassium 4.3 3.5 - 5.1 mmol/L   Chloride 105 98 - 111 mmol/L   CO2 18 (L) 22 - 32 mmol/L   Glucose, Bld 294 (H) 70 - 99  mg/dL    Comment: Glucose reference range applies only to samples taken after fasting for at least 8 hours.   BUN 18 8 - 23 mg/dL   Creatinine, Ser 6.04 (H) 0.44 - 1.00 mg/dL   Calcium 9.6 8.9 - 54.0 mg/dL   Total Protein 7.0 6.5 - 8.1 g/dL   Albumin 3.9 3.5 - 5.0 g/dL   AST 38 15 - 41 U/L   ALT 29 0 - 44 U/L   Alkaline Phosphatase 58 38 - 126 U/L   Total Bilirubin 1.8 (H) 0.3 - 1.2 mg/dL   GFR, Estimated 42 (L) >60 mL/min    Comment: (NOTE) Calculated using the CKD-EPI Creatinine Equation (2021)    Anion gap 15 5 - 15    Comment: Performed at [facility], [address], [place], [place] [address]  Lactic acid, plasma     Status: Abnormal   Collection Time: 12/30/22  4:18 PM  Result Value Ref Range   Lactic Acid, Venous 4.1 (HH) 0.5 - 1.9 mmol/L    Comment: CRITICAL RESULT CALLED TO, READ BACK BY AND VERIFIED WITH Len Blalock, RN @ 1753 12/30/22 BY [facility] Performed at [facility], [address] [place], [place] [address]   Ammonia     Status: Abnormal   Collection Time: 12/30/22  4:18 PM  Result Value Ref Range   Ammonia 77 (H) 9 - 35 umol/L    Comment: HEMOLYSIS AT THIS LEVEL MAY AFFECT RESULT Performed at [facility], [address] [place], [place] [address]   Magnesium     Status: Abnormal   Collection Time: 12/30/22  4:18 PM  Result Value Ref Range   Magnesium 2.5 (H) 1.7 - 2.4 mg/dL    Comment: Performed at [facility], [name] [place], [place] [address]  Ethanol     Status: None   Collection Time: 12/30/22  4:30 PM  Result Value Ref Range   Alcohol, Ethyl (B) <10 <10 mg/dL    Comment: (NOTE) Lowest detectable limit for serum alcohol is 10 mg/dL.  For medical purposes only. Performed at [facility], [address], [place], [place] [address]   I-Stat venous blood gas, [facility] ED, [facility], [facility])     Status: Abnormal   Collection Time: 12/30/22  5:58 PM  Result Value Ref Range   pH, Ven 7.559 (H) 7.25 - 7.43   pCO2, Ven 22.6  (L) 44 - 60 mmHg   pO2, Ven 90 (H) 32 -  45 mmHg   Bicarbonate 20.2 20.0 - 28.0 mmol/L   TCO2 21 (L) 22 - 32 mmol/L   O2 Saturation 98 %   Acid-base deficit 1.0 0.0 - 2.0 mmol/L   Sodium 140 135 - 145 mmol/L   Potassium 4.2 3.5 - 5.1 mmol/L   Calcium, Ion 1.04 (L) 1.15 - 1.40 mmol/L   HCT 33.0 (L) 36.0 - 46.0 %   Hemoglobin 11.2 (L) 12.0 - 15.0 g/dL   Sample type VENOUS   CBG monitoring, ED     Status: Abnormal   Collection Time: 12/30/22  9:40 PM  Result Value Ref Range   Glucose-Capillary 174 (H) 70 - 99 mg/dL    Comment: Glucose reference range applies only to samples taken after fasting for at least 8 hours.  Lactic acid, plasma     Status: None   Collection Time: 12/30/22  9:41 PM  Result Value Ref Range   Lactic Acid, Venous 1.9 0.5 - 1.9 mmol/L    Comment: Performed at [facility] Lab, [address] [place], [place] [address]  CBC     Status: Abnormal   Collection Time: 12/30/22  9:41 PM  Result Value Ref Range   WBC 5.6 4.0 - 10.5 K/uL   RBC 3.67 (L) 3.87 - 5.11 MIL/uL   Hemoglobin 11.6 (L) 12.0 - 15.0 g/dL   HCT 28.4 (L) 13.2 - 44.0 %   MCV 96.5 80.0 - 100.0 fL   MCH 31.6 26.0 - 34.0 pg   MCHC 32.8 30.0 - 36.0 g/dL   RDW 10.2 72.5 - 36.6 %   Platelets 119 (L) 150 - 400 K/uL   nRBC 0.0 0.0 - 0.2 %    Comment: Performed at [facility], [address], [place], [place] [address]  Creatinine, serum     Status: Abnormal   Collection Time: 12/30/22  9:41 PM  Result Value Ref Range   Creatinine, Ser 1.16 (H) 0.44 - 1.00 mg/dL   GFR, Estimated 50 (L) >60 mL/min    Comment: (NOTE) Calculated using the CKD-EPI Creatinine Equation (2021) Performed at [facility], [address], [place], [place] [address]   Protime-INR     Status: Abnormal   Collection Time: 12/30/22  9:41 PM  Result Value Ref Range   Prothrombin Time 16.5 (H) 11.4 - 15.2 seconds   INR 1.3 (H) 0.8 - 1.2    Comment: (NOTE) INR goal varies based on device and disease  states. Performed at [facility], [address], [place], [place] [address]   Hemoglobin A1c     Status: Abnormal   Collection Time: 12/30/22  9:41 PM  Result Value Ref Range   Hgb A1c MFr Bld 7.9 (H) 4.8 - 5.6 %    Comment: (NOTE) Pre diabetes:          5.7%-6.4%  Diabetes:              >6.4%  Glycemic control for   <7.0% adults with diabetes    Mean Plasma Glucose 180.03 mg/dL    Comment: Performed at [facility], [address], [place], [place] [address]  Lactic acid, plasma     Status: None   Collection Time: 12/30/22 11:30 PM  Result Value Ref Range   Lactic Acid, Venous 1.6 0.5 - 1.9 mmol/L    Comment: Performed at [facility], [address] [place], [place] [address]  Urinalysis, Routine w reflex microscopic -Urine, Clean  Catch     Status: Abnormal   Collection Time: 12/31/22 12:19 AM  Result Value Ref Range   Color, Urine YELLOW YELLOW   APPearance CLEAR CLEAR   Specific Gravity, Urine 1.015 1.005 - 1.030   pH 6.0 5.0 - 8.0   Glucose, UA >=500 (A) NEGATIVE mg/dL   Hgb urine dipstick NEGATIVE NEGATIVE   Bilirubin Urine NEGATIVE NEGATIVE   Ketones, ur NEGATIVE NEGATIVE mg/dL   Protein, ur NEGATIVE NEGATIVE mg/dL   Nitrite NEGATIVE NEGATIVE   Leukocytes,Ua NEGATIVE NEGATIVE   RBC / HPF 0-5 0 - 5 RBC/hpf   WBC, UA 0-5 0 - 5 WBC/hpf   Bacteria, UA NONE SEEN NONE SEEN   Squamous Epithelial / HPF 0-5 0 - 5 /HPF    Comment: Performed at [facility], [address] [place], [place] [address]  Rapid urine drug screen (hospital performed)     Status: None   Collection Time: 12/31/22 12:19 AM  Result Value Ref Range   Opiates NONE DETECTED NONE DETECTED   Cocaine NONE DETECTED NONE DETECTED   Benzodiazepines NONE DETECTED NONE DETECTED   Amphetamines NONE DETECTED NONE DETECTED   Tetrahydrocannabinol NONE DETECTED NONE DETECTED   Barbiturates NONE DETECTED NONE DETECTED    Comment: (NOTE) DRUG SCREEN FOR MEDICAL PURPOSES ONLY.  IF  CONFIRMATION IS NEEDED FOR ANY PURPOSE, NOTIFY LAB WITHIN 5 DAYS.  LOWEST DETECTABLE LIMITS FOR URINE DRUG SCREEN Drug Class                     Cutoff (ng/mL) Amphetamine and metabolites    1000 Barbiturate and metabolites    200 Benzodiazepine                 200 Opiates and metabolites        300 Cocaine and metabolites        300 THC                            50 Performed at [facility], [address] [place], [place] [address]    US Abdomen Limited  Result Date: 12/30/2022 CLINICAL DATA:  Cirrhosis.  Ascites. EXAM: ULTRASOUND ABDOMEN LIMITED RIGHT UPPER QUADRANT COMPARISON:  CT 03/06/2022 FINDINGS: Gallbladder: Physiologically distended. No gallstones or wall thickening visualized. No sonographic Murphy sign noted by sonographer. Common bile duct: Diameter: 2 mm, normal. Liver: Heterogeneous parenchyma with nodular contours. No discrete hepatic lesion. Borderline increased parenchymal echogenicity. Portal vein is patent on color Doppler imaging with normal direction of blood flow towards the liver. Other: No significant abdominal ascites. IMPRESSION: 1. Hepatic cirrhosis.  No evidence of focal lesion. 2. No significant ascites. Electronically Signed   By: Narda Rutherford M.D.   On: 12/30/2022 22:40   CT Head Wo Contrast  Result Date: 12/30/2022 CLINICAL DATA:  Mental status change, unknown cause EXAM: CT HEAD WITHOUT CONTRAST TECHNIQUE: Contiguous axial images were obtained from the base of the skull through the vertex without intravenous contrast. RADIATION DOSE REDUCTION: This exam was performed according to the departmental dose-optimization program which includes automated exposure control, adjustment of the mA and/or kV according to patient size and/or use of iterative reconstruction technique. COMPARISON:  CT head 02/14/2020. FINDINGS: Brain: No evidence of acute infarction, hemorrhage, hydrocephalus, extra-axial collection or mass lesion/mass effect. Vascular: No  hyperdense vessel or unexpected calcification. Skull: No acute fracture. Sinuses/Orbits: Clear sinuses.  No acute orbital findings. Other: No mastoid effusions.  IMPRESSION: No evidence of acute intracranial abnormality. Electronically Signed   By: Feliberto Harts M.D.   On: 12/30/2022 19:23    Pending Labs Unresulted Labs (From admission, onward)     Start     Ordered   01/06/23 0500  Creatinine, serum  (enoxaparin (LOVENOX)    CrCl >/= 30 ml/min)  Weekly,   R     Comments: while on enoxaparin therapy    12/30/22 2038   12/30/22 2205  Lactic acid, plasma  Now then every 2 hours,   R      12/30/22 2205            Vitals/Pain Today's Vitals   12/30/22 1730 12/30/22 1745 12/30/22 1830 12/30/22 2351  BP: (!) 150/67 (!) 176/72 (!) 158/65 (!) 153/64  Pulse: 92 87 89 96  Resp: (!) 23 20 18 17   Temp:    98.5 F (36.9 C)  TempSrc:    Oral  SpO2: 97% 99% 97% 99%  Weight:   73.4 kg   PainSc:        Isolation Precautions No active isolations  Medications Medications  enoxaparin (LOVENOX) injection 40 mg (40 mg Subcutaneous Given 12/30/22 2106)  spironolactone (ALDACTONE) tablet 50 mg (50 mg Oral Given 12/31/22 0021)  lactulose (CHRONULAC) 10 GM/15ML solution 30 g (30 g Oral Given 12/31/22 0010)  furosemide (LASIX) tablet 40 mg (40 mg Oral Given 12/30/22 2100)  insulin aspart (novoLOG) injection 0-15 Units (has no administration in time range)  insulin glargine-yfgn (SEMGLEE) injection 26 Units (has no administration in time range)  lactated ringers bolus 1,000 mL (1,000 mLs Intravenous New Bag/Given 12/30/22 1829)  lactulose (CHRONULAC) 10 GM/15ML solution 20 g (20 g Oral Given 12/30/22 2104)    Mobility walks with device     Focused Assessments Neuro Assessment Handoff:  Swallow screen pass? Yes          Neuro Assessment:   Neuro Checks:      Has TPA been given? No If patient is a Neuro Trauma and patient is going to OR before floor call report to 4N Charge nurse:  [phone] or [phone]   R Recommendations: See Admitting Provider Note  Report given to:   Additional Notes:  VSS, A&Ox4. No bm since arrival to ed.

## 2022-12-31 NOTE — Progress Notes (Signed)
   HD#1 SUBJECTIVE:  [name] is a 72 y.o. with a pertinent PMH of NASH cirrhosis, diabetes, and chronic diastolic heart failure, who presented with confusion and unsteadiness and admitted for hepatic encephalopathy.   Overnight Events: no acute events overnight  Interim History: The patient was evaluated at the bedside. She feels tired and overall is not feeling well, and also notes that she has not had a bowel movement yet this morning.   OBJECTIVE:  Vital Signs: Vitals:   12/30/22 2351 12/31/22 0255 12/31/22 0301 12/31/22 0513  BP: (!) 153/64 (!) 180/73  (!) 160/73  Pulse: 96 83  84  Resp: 17 19  14   Temp: 98.5 F (36.9 C) 98.5 F (36.9 C)  98.4 F (36.9 C)  TempSrc: Oral Oral  Oral  SpO2: 99% 98%  99%  Weight:   75.8 kg   Height:   5\' 3"  (1.6 m)    Supplemental O2: Room Air SpO2: 99 %  Filed Weights   12/30/22 1830 12/31/22 0301  Weight: 73.4 kg 75.8 kg     Intake/Output Summary (Last 24 hours) at 12/31/2022 0615 Last data filed at 12/31/2022 0330 Gross per 24 hour  Intake 1000 ml  Output 850 ml  Net 150 ml   Net IO Since Admission: 150 mL [12/31/22 0615]  Physical Exam: General: chronically ill-appearing elderly female laying in bed. No acute distress. CV: RRR. Systolic murmur appreciated. Pulmonary: Lungs clear, normal work of breathing. Abdominal: Soft, nontender, nondistended. No shifting dullness.  Skin: Warm and dry.  Neuro: A&Ox3. +Asterixis    ASSESSMENT/PLAN:  Assessment: Principal Problem:   Hepatic encephalopathy (HCC) Active Problems:   Chronic diastolic heart failure (HCC)   Cirrhosis, nonalcoholic (HCC)   Thrombocytopenia (HCC)   Diabetes mellitus with hyperglycemia, with long-term current use of insulin (HCC)   AKI (acute kidney injury) (HCC)   Plan: Hepatic encephalopathy NASH cirrhosis Patient has been non-adherent with lactulose, making hepatic encephalopathy leading suspicion for the patient's confusion  and overall presentation. No s/s of infection and CT head was unremarkable. Abdominal ultrasound was obtained without any significant ascites, just remarkable for hepatic cirrhosis. Increased lactulose to q2h this morning, however, she had a bowel movement and we have now decreased it. - Lactulose 30 g TID; titrate to ~3 BM/day - Hold spironolactone 100 mg and lasix 40 mg daily given AKI (hopefully can resume soon) - PT/OT consulted  Chronic diastolic heart failure Systolic murmur Well compensated. Last echo in 2021 with EF 60-65%. Holding diuretics as above.  AKI Cr 1.2, up from 0.8 a few months ago.  - BMP in the AM - Hold diuretics - Avoid nephrtoxins   Best Practice: Diet: Cardiac diet IVF: Fluids: none VTE: enoxaparin (LOVENOX) injection 40 mg Start: 12/30/22 2045 Code: Full AB: none Therapy Recs: Pending Family Contact: Niece, Birdie Hopes, and friend, [name] at bedside. DISPO: Anticipated discharge pending  medical stability .  Signature: Elza Rafter, D.O.  Internal Medicine Resident, PGY-2 [facility]  Pager: [phone] 6:15 AM, 12/31/2022   Please contact the on call pager after 5 pm and on weekends at [phone].

## 2023-01-01 DIAGNOSIS — K7581 Nonalcoholic steatohepatitis (NASH): Secondary | ICD-10-CM | POA: Diagnosis not present

## 2023-01-01 DIAGNOSIS — I5032 Chronic diastolic (congestive) heart failure: Secondary | ICD-10-CM | POA: Diagnosis not present

## 2023-01-01 DIAGNOSIS — K7682 Hepatic encephalopathy: Secondary | ICD-10-CM | POA: Diagnosis not present

## 2023-01-01 LAB — BASIC METABOLIC PANEL
Anion gap: 12 (ref 5–15)
BUN: 17 mg/dL (ref 8–23)
CO2: 19 mmol/L — ABNORMAL LOW (ref 22–32)
Calcium: 9.2 mg/dL (ref 8.9–10.3)
Chloride: 107 mmol/L (ref 98–111)
Creatinine, Ser: 1.16 mg/dL — ABNORMAL HIGH (ref 0.44–1.00)
GFR, Estimated: 50 mL/min — ABNORMAL LOW (ref >60)
Glucose, Bld: 196 mg/dL — ABNORMAL HIGH (ref 70–99)
Potassium: 3.3 mmol/L — ABNORMAL LOW (ref 3.5–5.1)
Sodium: 138 mmol/L (ref 135–145)

## 2023-01-01 LAB — CBC
HCT: 36.7 % (ref 36.0–46.0)
Hemoglobin: 12.1 g/dL (ref 12.0–15.0)
MCH: 31.8 pg (ref 26.0–34.0)
MCHC: 33 g/dL (ref 30.0–36.0)
MCV: 96.3 fL (ref 80.0–100.0)
Platelets: 118 10*3/uL — ABNORMAL LOW (ref 150–400)
RBC: 3.81 MIL/uL — ABNORMAL LOW (ref 3.87–5.11)
RDW: 13.5 % (ref 11.5–15.5)
WBC: 5.4 10*3/uL (ref 4.0–10.5)
nRBC: 0 % (ref 0.0–0.2)

## 2023-01-01 LAB — GLUCOSE, CAPILLARY
Glucose-Capillary: 148 mg/dL — ABNORMAL HIGH (ref 70–99)
Glucose-Capillary: 217 mg/dL — ABNORMAL HIGH (ref 70–99)
Glucose-Capillary: 221 mg/dL — ABNORMAL HIGH (ref 70–99)
Glucose-Capillary: 237 mg/dL — ABNORMAL HIGH (ref 70–99)

## 2023-01-01 MED ORDER — SPIRONOLACTONE 25 MG PO TABS
50.0000 mg | ORAL_TABLET | Freq: Two times a day (BID) | ORAL | Status: DC
  Administered 2023-01-01 – 2023-01-03 (×5): 50 mg via ORAL
  Filled 2023-01-01 (×5): qty 2

## 2023-01-01 MED ORDER — POTASSIUM CHLORIDE 20 MEQ PO PACK
20.0000 meq | PACK | Freq: Once | ORAL | Status: AC
  Administered 2023-01-01: 20 meq via ORAL
  Filled 2023-01-01: qty 1

## 2023-01-01 MED ORDER — FUROSEMIDE 40 MG PO TABS
40.0000 mg | ORAL_TABLET | Freq: Every day | ORAL | Status: DC
  Administered 2023-01-01 – 2023-01-03 (×3): 40 mg via ORAL
  Filled 2023-01-01 (×3): qty 1

## 2023-01-01 NOTE — Progress Notes (Signed)
IMTS Daily Note  Subjective: [name] feels much better this morning. She doesn't feel 100% mentally back to her baseline, but she feels better. No chest pain, shortness of breath, or abdominal pain. Feels that her abdomen is more full today.   Objective:  Vital signs in last 24 hours: Vitals:   12/31/22 1959 12/31/22 2104 01/01/23 0418 01/01/23 0910  BP: 125/68 (!) 149/68 (!) 128/43 138/62  Pulse: 87 81 78 86  Resp: 18 20 18 18   Temp: 98.6 F (37 C) 98.2 F (36.8 C) 98.4 F (36.9 C) 98.9 F (37.2 C)  TempSrc: Oral Oral  Oral  SpO2: 97% 99% 98% 100%  Weight:      Height:       GEN: well appearing woman sitting in chair, NAD CV: rrr, no m/r/g PULM: lungs clear bilaterally, no wheezes or crackles ABD: soft, NT/ND, no shifting dullness, +bs EXT: warm, no edema NEURO: A&Ox3, not to situation. No asterixis.   Assessment/Plan:  Principal Problem:   Hepatic encephalopathy (HCC) Active Problems:   Chronic diastolic heart failure (HCC)   Cirrhosis, nonalcoholic (HCC)   Thrombocytopenia (HCC)   Diabetes mellitus with hyperglycemia, with long-term current use of insulin (HCC)   AKI (acute kidney injury) (HCC)   Other cirrhosis of liver (HCC)  71yo woman with NASH cirrhosis c/b HE who presented with acute confusion & unsteadiness admitted for hepatic encephalopathy, improving.  # Hepatic encephalopathy - improving # NASH cirrhosis  Patient had 3 bowel movements yesterday and feels like she needs to have another one soon. I think the trigger was not taking lactulose, I don't think she has an acute infection - Lactulose 30g TID, goal 3 bm's daily  - Restart home Lasix 40mg  daily & Spironolactone 50mg  BID today with BMP check tomorrow - PT/OT consulted and feel that patient is physically at her baseline  # HFpEF - Restarting home diuretics today, as above    Dispo: Anticipated discharge in approximately 1 day(s). - I think she can go tomorrow if she's having regular bowel  movements and GFR is stable on home diuretics  Mercie Eon, MD 01/01/2023, 1:01 PM

## 2023-01-01 NOTE — Evaluation (Signed)
Physical Therapy Evaluation Patient Details Name: [name] MRN: [id number] DOB: March 25, 1951 Today's Date: 01/01/2023  History of Present Illness  Pt is 72 yo female admitted with confusion and unsteadiness associated with hepatic encephalopathy on 12/30/22.  Pt with hx including but not limited to NASH cirrhosis, DM, and CHF  Clinical Impression  Pt admitted with above diagnosis.  She reports feeling much better.  She demonstrated safe transfers and ambulated 500' without assistance.  Pt reports feeling slightly weaker than normal but eager to return home.  Pt expected to progress well on her own and no skilled acute PT needs.        Recommendations for follow up therapy are one component of a multi-disciplinary discharge planning process, led by the attending physician.  Recommendations may be updated based on patient status, additional functional criteria and insurance authorization.  Follow Up Recommendations       Assistance Recommended at Discharge PRN  Patient can return home with the following       Equipment Recommendations None recommended by PT  Recommendations for Other Services       Functional Status Assessment Patient has not had a recent decline in their functional status     Precautions / Restrictions Precautions Precautions: Fall Restrictions Weight Bearing Restrictions: No      Mobility  Bed Mobility               General bed mobility comments: in chair    Transfers Overall transfer level: Needs assistance Equipment used: None Transfers: Sit to/from Stand Sit to Stand: Supervision           General transfer comment: Performed safely    Ambulation/Gait Ambulation/Gait assistance: Min guard, Supervision Gait Distance (Feet): 500 Feet Assistive device: None Gait Pattern/deviations: WFL(Within Functional Limits) Gait velocity: normal     General Gait Details: Started min guard prorgressed to supervision; no LOB; did drift once  but recovered independently  Stairs            Wheelchair Mobility    Modified Rankin (Stroke Patients Only)       Balance Overall balance assessment: Needs assistance   Sitting balance-Leahy Scale: Normal     Standing balance support: No upper extremity supported Standing balance-Leahy Scale: Good               High level balance activites: Backward walking, Direction changes, Turns, Head turns High Level Balance Comments: Able to do above without LOB             Pertinent Vitals/Pain Pain Assessment Pain Assessment: No/denies pain    Home Living Family/patient expects to be discharged to:: Private residence Living Arrangements: Other (Comment) Belinda Fisher) Available Help at Discharge: Family;Available 24 hours/day Type of Home: House Home Access: Stairs to enter   [employer] of Steps: threshold Alternate Level Stairs-Number of Steps: Has a stair lift Home Layout: Two level;Able to live on main level with bedroom/bathroom Home Equipment: Shower seat Additional Comments: 4 walking canes that were her sister in law    Prior Function Prior Level of Function : Independent/Modified Independent;Driving             Mobility Comments: could ambulate in community without ad ADLs Comments: did iadls and adls     Hand Dominance        Extremity/Trunk Assessment   Upper Extremity Assessment Upper Extremity Assessment: Generalized weakness    Lower Extremity Assessment Lower Extremity Assessment: Overall WFL for tasks assessed  Cervical / Trunk Assessment Cervical / Trunk Assessment: Normal  Communication   Communication: No difficulties  Cognition Arousal/Alertness: Awake/alert Behavior During Therapy: WFL for tasks assessed/performed Overall Cognitive Status: Within Functional Limits for tasks assessed                                 General Comments: Pt reports feeling back to normal - realized she was  confused at admission.  She answered questions appropriately and was aware on medical situation        General Comments General comments (skin integrity, edema, etc.): Donned socks independently    Exercises     Assessment/Plan    PT Assessment Patient does not need any further PT services  PT Problem List         PT Treatment Interventions      PT Goals (Current goals can be found in the Care Plan section)  Acute Rehab PT Goals Patient Stated Goal: return home PT Goal Formulation: All assessment and education complete, DC therapy    Frequency       Co-evaluation               AM-PAC PT "6 Clicks" Mobility  Outcome Measure Help needed turning from your back to your side while in a flat bed without using bedrails?: None Help needed moving from lying on your back to sitting on the side of a flat bed without using bedrails?: None Help needed moving to and from a bed to a chair (including a wheelchair)?: A Little Help needed standing up from a chair using your arms (e.g., wheelchair or bedside chair)?: A Little Help needed to walk in hospital room?: A Little Help needed climbing 3-5 steps with a railing? : A Little 6 Click Score: 20    End of Session Equipment Utilized During Treatment: Gait belt Activity Tolerance: Patient tolerated treatment well Patient left: with call bell/phone within reach;in chair Nurse Communication: Mobility status PT Visit Diagnosis: Other abnormalities of gait and mobility (R26.89)    Time: 2130-8657 PT Time Calculation (min) (ACUTE ONLY): 15 min   Charges:   PT Evaluation $PT Eval Low Complexity: 1 Low          Sheretha Shadd, PT Acute Rehab [facility] [phone]   Rayetta Humphrey 01/01/2023, 11:43 AM

## 2023-01-01 NOTE — Progress Notes (Signed)
OT Cancellation Note - OT Screen  Patient Details Name: [name] MRN: [id number] DOB: 08/09/1950   Cancelled Treatment:    Reason Eval/Treat Not Completed: OT screened, no needs identified, will sign off (Per RN and PT report, pt is completeing ADLs Independent, functional mobility without assistance, and is near baseline. OT spoke with pt who reports no concerns and no equipment needs. Pt has no acute skilled OT needs at this time. OT is signing off.)  Marcina Kinnison "Orson Eva., OTR/L, MA Acute Rehab [phone]   Lendon Colonel 01/01/2023, 12:20 PM

## 2023-01-02 ENCOUNTER — Observation Stay (HOSPITAL_COMMUNITY): Payer: Medicare Other

## 2023-01-02 DIAGNOSIS — R011 Cardiac murmur, unspecified: Secondary | ICD-10-CM | POA: Diagnosis not present

## 2023-01-02 DIAGNOSIS — N179 Acute kidney failure, unspecified: Secondary | ICD-10-CM

## 2023-01-02 DIAGNOSIS — R109 Unspecified abdominal pain: Secondary | ICD-10-CM | POA: Diagnosis not present

## 2023-01-02 DIAGNOSIS — I5032 Chronic diastolic (congestive) heart failure: Secondary | ICD-10-CM | POA: Diagnosis not present

## 2023-01-02 DIAGNOSIS — K7682 Hepatic encephalopathy: Secondary | ICD-10-CM | POA: Diagnosis not present

## 2023-01-02 DIAGNOSIS — K7581 Nonalcoholic steatohepatitis (NASH): Secondary | ICD-10-CM | POA: Diagnosis not present

## 2023-01-02 DIAGNOSIS — Z794 Long term (current) use of insulin: Secondary | ICD-10-CM | POA: Diagnosis not present

## 2023-01-02 DIAGNOSIS — E119 Type 2 diabetes mellitus without complications: Secondary | ICD-10-CM | POA: Diagnosis not present

## 2023-01-02 LAB — GLUCOSE, CAPILLARY
Glucose-Capillary: 203 mg/dL — ABNORMAL HIGH (ref 70–99)
Glucose-Capillary: 213 mg/dL — ABNORMAL HIGH (ref 70–99)
Glucose-Capillary: 235 mg/dL — ABNORMAL HIGH (ref 70–99)
Glucose-Capillary: 227 mg/dL — ABNORMAL HIGH (ref 70–99)

## 2023-01-02 LAB — BASIC METABOLIC PANEL
Anion gap: 11 (ref 5–15)
BUN: 18 mg/dL (ref 8–23)
CO2: 22 mmol/L (ref 22–32)
Calcium: 9.3 mg/dL (ref 8.9–10.3)
Chloride: 103 mmol/L (ref 98–111)
Creatinine, Ser: 1.26 mg/dL — ABNORMAL HIGH (ref 0.44–1.00)
GFR, Estimated: 45 mL/min — ABNORMAL LOW (ref >60)
Glucose, Bld: 210 mg/dL — ABNORMAL HIGH (ref 70–99)
Potassium: 4.1 mmol/L (ref 3.5–5.1)
Sodium: 136 mmol/L (ref 135–145)

## 2023-01-02 MED ORDER — LACTULOSE 10 GM/15ML PO SOLN
30.0000 g | Freq: Four times a day (QID) | ORAL | Status: DC
  Administered 2023-01-02 – 2023-01-03 (×4): 30 g via ORAL
  Filled 2023-01-02 (×4): qty 45

## 2023-01-02 MED ORDER — ONDANSETRON HCL 4 MG PO TABS
4.0000 mg | ORAL_TABLET | Freq: Three times a day (TID) | ORAL | Status: DC | PRN
  Administered 2023-01-02 – 2023-01-03 (×3): 4 mg via ORAL
  Filled 2023-01-02 (×3): qty 1

## 2023-01-02 MED ORDER — INSULIN GLARGINE-YFGN 100 UNIT/ML ~~LOC~~ SOLN
40.0000 [IU] | Freq: Every day | SUBCUTANEOUS | Status: DC
  Administered 2023-01-03: 40 [IU] via SUBCUTANEOUS
  Filled 2023-01-02: qty 0.4

## 2023-01-02 NOTE — Inpatient Diabetes Management (Signed)
Inpatient Diabetes Program Recommendations  AACE/ADA: New Consensus Statement on Inpatient Glycemic Control (2015)  Target Ranges:  Prepandial:   less than 140 mg/dL      Peak postprandial:   less than 180 mg/dL (1-2 hours)      Critically ill patients:  140 - 180 mg/dL   Lab Results  Component Value Date   GLUCAP 203 (H) 01/02/2023   HGBA1C 7.9 (H) 12/30/2022    Review of Glycemic Control  Diabetes history: DM2 Outpatient Diabetes medications: Toujeo 40 QD, Novolog 30 BID, metformin 500 mg BI, Ozempic 1 mg weekly Current orders for Inpatient glycemic control: Semglee 26 units QD, Novolog 0-15 TID with meals  Inpatient Diabetes Program Recommendations:    Consider adding Novolog 4 units TID with meals if eating > 50%.  Continue to follow.  Thank you. Ailene Ards, RD, LDN, CDCES Inpatient Diabetes Coordinator [phone]

## 2023-01-02 NOTE — Progress Notes (Signed)
HD#0 Subjective:   Summary: [name] is a 72 y.o. female with PMH of NASH cirrhosis, diabetes, chronic diastolic HF who presents with unsteadiness and admitted for hepatic encephalopathy.  Overnight Events: None  Patient states she feels nauseous which is not new.  States that she feels more bloated and has not had bowel movement since yesterday morning. Passing some gas. Denies any confusion.  Mild soreness of her abdomen from being bloated.  Discussed the plan with patient and answered all questions.  Objective:  Vital signs in last 24 hours: Vitals:   01/01/23 1632 01/01/23 2109 01/02/23 0418 01/02/23 0455  BP: (!) 134/59 (!) 140/60 (!) 146/68   Pulse: 90 87 88   Resp: 18 18 18    Temp:  98.6 F (37 C) 98 F (36.7 C)   TempSrc:  Oral Oral   SpO2: 100% 99% 97%   Weight:    73.4 kg  Height:       Supplemental O2: Room Air SpO2: 97 %   Physical Exam:  Constitutional: alert, sitting up in chair comfortably, in no acute distress Cardiovascular: regular rate and rhythm, systolic murmur appreciated, no LE edema Pulmonary/Chest: normal work of breathing on room air, lungs clear to auscultation bilaterally Abdominal: hypoactive bowel sounds, soft, mild diffuse tenderness, distended, no guarding or rebound MSK: normal bulk and tone Neurological: alert & oriented x 3 Skin: warm and dry Psych: pleasant mood  Filed Weights   12/30/22 1830 12/31/22 0301 01/02/23 0455  Weight: 73.4 kg 75.8 kg 73.4 kg     Intake/Output Summary (Last 24 hours) at 01/02/2023 0742 Last data filed at 01/02/2023 0600 Gross per 24 hour  Intake 1560 ml  Output 2300 ml  Net -740 ml   Net IO Since Admission: 180 mL [01/02/23 0742]  Pertinent Labs:    Latest Ref Rng & Units 01/01/2023   12:44 AM 12/30/2022    9:41 PM 12/30/2022    5:58 PM  CBC  WBC 4.0 - 10.5 K/uL 5.4  5.6    Hemoglobin 12.0 - 15.0 g/dL 78.2  95.6  21.3   Hematocrit 36.0 - 46.0 % 36.7  35.4  33.0   Platelets 150 - 400  K/uL 118  119         Latest Ref Rng & Units 01/02/2023    4:31 AM 01/01/2023   12:44 AM 12/31/2022    7:11 AM  CMP  Glucose 70 - 99 mg/dL 086  578  469   BUN 8 - 23 mg/dL 18  17  16    Creatinine 0.44 - 1.00 mg/dL 6.29  5.28  4.13   Sodium 135 - 145 mmol/L 136  138  138   Potassium 3.5 - 5.1 mmol/L 4.1  3.3  3.3   Chloride 98 - 111 mmol/L 103  107  105   CO2 22 - 32 mmol/L 22  19  21    Calcium 8.9 - 10.3 mg/dL 9.3  9.2  9.3   Total Protein 6.5 - 8.1 g/dL   6.4   Total Bilirubin 0.3 - 1.2 mg/dL   2.2   Alkaline Phos 38 - 126 U/L   48   AST 15 - 41 U/L   32   ALT 0 - 44 U/L   26     Imaging: No results found.  Assessment/Plan:   Principal Problem:   Hepatic encephalopathy (HCC) Active Problems:   Chronic diastolic heart failure (HCC)   Cirrhosis, nonalcoholic (HCC)  Thrombocytopenia (HCC)   Diabetes mellitus with hyperglycemia, with long-term current use of insulin (HCC)   AKI (acute kidney injury) (HCC)   Other cirrhosis of liver (HCC)   [name] is a 72 y.o. with a pertinent PMH of NASH cirrhosis, diabetes, chronic diastolic HF who presents with unsteadiness and admitted for hepatic encephalopathy.  #Hepatic encephalopathy 2/2 medication non-adherence #NASH cirrhosis Mentation has improved. Was having 2-3 BM with restarting lactulose. Has not had BM since yesterday morning. Passing little gas and reports feeling more bloated. US abdomen showed cirrhosis but no significant ascites. Will increase lactulose today and obtain KUB to r/o bowel obstruction.  -pending KUB to evaluate for bowel obstruction -Continue spironolactone 50 mg BID and Lasix 40 mg daily -Increase lactulose 30 g to QID, goal of 2-3 BM daily  #AKI Presented with AKI with creatinine 1.34 on arrival.  Appears baseline in January was 0.8-0.9. UA unremarkable besides glucose. Creatinine had improved to 1.16, had restarted Lasix and spironolactone due to increased bloating  yesterday and creatinine increased slightly to 1.26 today. Will monitor renal function.  -trend BMP  #Chronic diastolic heart failure #Systolic heart murmur Last echo in 2021 with EF 60-65%. She was restarted on home Lasix yesterday.   -continue home Lasix 40 mg daily  #T2DM on chronic insulin  PTA on Toujeo 40 units daily.  She was started on glargine 26 units here initially with SSI-M.  Fasting glucose today was 203.  Will increase long-acting to home dosing. -increase glargine to 40 units daily -continue SSI-M   Diet: Normal IVF: None,None VTE: Enoxaparin Code: Full PT/OT recs: None  Dispo: Anticipated discharge to Home pending having regular BM  Rana Snare, DO Internal Medicine Resident PGY-1 Pager: [phone] Please contact the on call pager after 5 pm and on weekends at [phone].

## 2023-01-02 NOTE — Care Management Obs Status (Signed)
MEDICARE OBSERVATION STATUS NOTIFICATION   Patient Details  Name: Samanthan Dugo MRN: [id number] Date of Birth: 02-01-1951   Medicare Observation Status Notification Given:  Yes    Tom-Johnson, Hershal Coria, RN 01/02/2023, 10:03 AM

## 2023-01-02 NOTE — TOC CM/SW Note (Signed)
Transition of Care [facility]) - Inpatient Brief Assessment   Patient Details  Name: [name] MRN: [id number] Date of Birth: 15-Jul-1951  Transition of [facility]) CM/SW Contact:    Tom-Johnson, Hershal Coria, RN Phone Number: 01/02/2023, 5:11 PM   Clinical Narrative:  Admitted with Hepatic Encephalopathy, with improved mentation.  No PT f/u noted. No TOC needs or recommendations noted. CM will continue to follow as patient progresses with care towards discharge.        Transition of Care Asessment: Insurance and Status: Insurance coverage has been reviewed Patient has primary care physician: Yes Home environment has been reviewed: Yes Prior level of function:: Independent Prior/Current Home Services: No current home services   Readmission risk has been reviewed: Yes Transition of care needs: no transition of care needs at this time

## 2023-01-03 ENCOUNTER — Encounter: Payer: Self-pay | Admitting: Oncology

## 2023-01-03 ENCOUNTER — Telehealth (HOSPITAL_COMMUNITY): Payer: Self-pay | Admitting: Pharmacy Technician

## 2023-01-03 ENCOUNTER — Other Ambulatory Visit (HOSPITAL_COMMUNITY): Payer: Self-pay

## 2023-01-03 DIAGNOSIS — R011 Cardiac murmur, unspecified: Secondary | ICD-10-CM | POA: Diagnosis not present

## 2023-01-03 DIAGNOSIS — K7682 Hepatic encephalopathy: Secondary | ICD-10-CM | POA: Diagnosis not present

## 2023-01-03 DIAGNOSIS — E119 Type 2 diabetes mellitus without complications: Secondary | ICD-10-CM | POA: Diagnosis not present

## 2023-01-03 DIAGNOSIS — K7581 Nonalcoholic steatohepatitis (NASH): Secondary | ICD-10-CM | POA: Diagnosis not present

## 2023-01-03 DIAGNOSIS — Z794 Long term (current) use of insulin: Secondary | ICD-10-CM | POA: Diagnosis not present

## 2023-01-03 DIAGNOSIS — N179 Acute kidney failure, unspecified: Secondary | ICD-10-CM | POA: Diagnosis not present

## 2023-01-03 DIAGNOSIS — I5032 Chronic diastolic (congestive) heart failure: Secondary | ICD-10-CM | POA: Diagnosis not present

## 2023-01-03 LAB — BASIC METABOLIC PANEL
Anion gap: 11 (ref 5–15)
BUN: 13 mg/dL (ref 8–23)
CO2: 21 mmol/L — ABNORMAL LOW (ref 22–32)
Calcium: 9.6 mg/dL (ref 8.9–10.3)
Chloride: 104 mmol/L (ref 98–111)
Creatinine, Ser: 1.08 mg/dL — ABNORMAL HIGH (ref 0.44–1.00)
GFR, Estimated: 54 mL/min — ABNORMAL LOW (ref >60)
Glucose, Bld: 239 mg/dL — ABNORMAL HIGH (ref 70–99)
Potassium: 3.7 mmol/L (ref 3.5–5.1)
Sodium: 136 mmol/L (ref 135–145)

## 2023-01-03 LAB — GLUCOSE, CAPILLARY
Glucose-Capillary: 178 mg/dL — ABNORMAL HIGH (ref 70–99)
Glucose-Capillary: 192 mg/dL — ABNORMAL HIGH (ref 70–99)

## 2023-01-03 MED ORDER — LACTULOSE ENEMA
300.0000 mL | Freq: Once | ORAL | Status: AC
  Administered 2023-01-03: 300 mL via RECTAL
  Filled 2023-01-03: qty 300

## 2023-01-03 MED ORDER — LACTULOSE 10 GM/15ML PO SOLN
30.0000 g | Freq: Four times a day (QID) | ORAL | 0 refills | Status: AC
  Filled 2023-01-03: qty 2370, 13d supply, fill #0

## 2023-01-03 NOTE — Progress Notes (Signed)
HD#0 Subjective:   Summary: [name] is a 72 y.o. female with PMH of NASH cirrhosis, diabetes, chronic diastolic HF who presents with unsteadiness and admitted for hepatic encephalopathy.  Overnight Events: None  She reports that she is nauseated. Has not had bowel movement since 6/9. Still passing some gas but remains bloated. She is reporting that she is taking the lactulose as prescribed. Discussed the plan with patient and answered all questions.  Objective:  Vital signs in last 24 hours: Vitals:   01/02/23 1525 01/02/23 2157 01/03/23 0500 01/03/23 0608  BP: 124/64 (!) 121/51  136/63  Pulse: 82 (!) 103  98  Resp: 18 18  17   Temp: 98.3 F (36.8 C) 98 F (36.7 C)  98.2 F (36.8 C)  TempSrc: Oral   Oral  SpO2: 98% 99%  98%  Weight:   77.1 kg   Height:       Supplemental O2: Room Air SpO2: 98 %   Physical Exam:  Constitutional: alert, lying in bed comfortably, in no acute distress Cardiovascular: regular rate and rhythm, systolic murmur appreciated Pulmonary/Chest: normal work of breathing on room air, lungs clear to auscultation bilaterally Abdominal: bowel sounds present, soft, mild diffuse tenderness and distended, no guarding or rebound MSK: normal bulk and tone Neurological: alert & oriented x 3, no asterixis   Skin: warm and dry Psych: pleasant mood  Filed Weights   12/31/22 0301 01/02/23 0455 01/03/23 0500  Weight: 75.8 kg 73.4 kg 77.1 kg     Intake/Output Summary (Last 24 hours) at 01/03/2023 0613 Last data filed at 01/02/2023 2201 Gross per 24 hour  Intake 240 ml  Output 0 ml  Net 240 ml    Net IO Since Admission: 420 mL [01/03/23 0613]  Pertinent Labs:    Latest Ref Rng & Units 01/01/2023   12:44 AM 12/30/2022    9:41 PM 12/30/2022    5:58 PM  CBC  WBC 4.0 - 10.5 K/uL 5.4  5.6    Hemoglobin 12.0 - 15.0 g/dL 16.1  09.6  04.5   Hematocrit 36.0 - 46.0 % 36.7  35.4  33.0   Platelets 150 - 400 K/uL 118  119         Latest Ref Rng &  Units 01/03/2023    4:22 AM 01/02/2023    4:31 AM 01/01/2023   12:44 AM  CMP  Glucose 70 - 99 mg/dL 409  811  914   BUN 8 - 23 mg/dL 13  18  17    Creatinine 0.44 - 1.00 mg/dL 7.82  9.56  2.13   Sodium 135 - 145 mmol/L 136  136  138   Potassium 3.5 - 5.1 mmol/L 3.7  4.1  3.3   Chloride 98 - 111 mmol/L 104  103  107   CO2 22 - 32 mmol/L 21  22  19    Calcium 8.9 - 10.3 mg/dL 9.6  9.3  9.2     Imaging: DG Abd 1 View  Result Date: 01/02/2023 CLINICAL DATA:  Abdominal pain, distention EXAM: ABDOMEN - 1 VIEW COMPARISON:  CT abdomen/pelvis 03/11/2022 FINDINGS: There is a nonobstructive bowel gas pattern. There is no gross organomegaly or abnormal soft tissue calcification. There is no definite free intraperitoneal air, within the confines of supine technique. The imaged lung bases are clear. There is no acute osseous abnormality. IMPRESSION: No acute finding in the abdomen. Electronically Signed   By: Lesia Hausen M.D.   On: 01/02/2023 16:26  Assessment/Plan:   Principal Problem:   Hepatic encephalopathy (HCC) Active Problems:   Chronic diastolic heart failure (HCC)   Cirrhosis, nonalcoholic (HCC)   Thrombocytopenia (HCC)   Diabetes mellitus with hyperglycemia, with long-term current use of insulin (HCC)   AKI (acute kidney injury) (HCC)   Other cirrhosis of liver (HCC)   [name] is a 72 y.o. with a pertinent PMH of NASH cirrhosis, diabetes, chronic diastolic HF who presents with unsteadiness and admitted for hepatic encephalopathy.  #Hepatic encephalopathy 2/2 medication non-adherence, resolved #NASH cirrhosis Mentation has improved. Was having 2-3 BM with restarting lactulose. Has not had BM since morning of 6/9. Passing little gas and reports feeling more bloated. US abdomen showed cirrhosis but no significant ascites. No obstruction on KUB yesterday. Present bowel sounds on exam today. Increased lactulose to QID yesterday but still no BM. Will add on  lactulose enema and discussed with pharmacy regarding rifaximin.  -Continue spironolactone 50 mg BID and Lasix 40 mg daily -Continue lactulose 30 g QID, goal of 2-3 BM daily -Lactulose enema today -Pending prior auth for rifaximin   #AKI, improving Resolving with creatinine today 1.08. Baseline in January appears to be 0.8-0.9. -trend renal function  #Chronic diastolic heart failure #Systolic heart murmur Last echo in 2021 with EF 60-65%.  -continue home Lasix 40 mg daily  #T2DM on chronic insulin  PTA on Toujeo 40 units daily. Increased long acting to home dosing yesterday, continue CBG checks. Fasting glucose today 178.   -glargine 40 units daily -continue SSI-M   Diet: Normal IVF: None,None VTE: Enoxaparin Code: Full PT/OT recs: None  Dispo: Anticipated discharge to Home pending having regular BMs on lactulose  Rana Snare, DO Internal Medicine Resident PGY-1 Pager: [phone] Please contact the on call pager after 5 pm and on weekends at [phone].

## 2023-01-03 NOTE — Discharge Instructions (Signed)
You were hospitalized for confusion likely due to your liver disease. We restarted your lactulose and your mentation improved back to baseline. We have prescribed you lactulose 30 g (45 mL) 4 times a day. The goal is to have 3 bowel movements a day. Your hemoglobin was stable and your kidney function is close to your normal. Please make sure to follow-up with your primary care doctor and specialists given recent hospitalization. Thank you for allowing [place] to be part of your care.   Please note these changes made to your medications:  *Please START taking:  --Lactulose 30 g (45 mL) 4 times a day, goal of 3 bowel movements a day  Please make sure to take your lactulose at home and follow-up with your doctors/providers outside of the hospital.

## 2023-01-03 NOTE — Discharge Summary (Signed)
Name: [name] MRN: [id number] DOB: 1950-11-08 72 y.o. PCP: Destiny Fusi, MD  Date of Admission: 12/30/2022  3:08 PM Date of Discharge: 01/03/23 Attending Physician: [name]  Discharge Diagnosis: Principal Problem:   Hepatic encephalopathy (HCC) Active Problems:   Chronic diastolic heart failure (HCC)   Cirrhosis, nonalcoholic (HCC)   Thrombocytopenia (HCC)   Diabetes mellitus with hyperglycemia, with long-term current use of insulin (HCC)   AKI (acute kidney injury) (HCC)   Other cirrhosis of liver (HCC)    Discharge Medications: Allergies as of 01/03/2023   No Known Allergies      Medication List     TAKE these medications    albuterol 108 (90 Base) MCG/ACT inhaler Commonly known as: VENTOLIN HFA Inhale 2 puffs into the lungs as needed for wheezing or shortness of breath.   furosemide 40 MG tablet Commonly known as: LASIX Take 40 mg by mouth daily.   insulin aspart 100 UNIT/ML FlexPen Commonly known as: NOVOLOG Inject 30 Units into the skin 2 (two) times daily.   lactulose 10 GM/15ML solution Commonly known as: CHRONULAC Take 45 mLs (30 g total) by mouth 4 (four) times daily. What changed: See the new instructions.   metFORMIN 500 MG 24 hr tablet Commonly known as: GLUCOPHAGE-XR Take 500 mg by mouth 2 (two) times daily with a meal.   ondansetron 4 MG tablet Commonly known as: ZOFRAN Take 1 tablet (4 mg total) by mouth every 4 (four) hours as needed for nausea.   Ozempic (1 MG/DOSE) 4 MG/3ML Sopn Generic drug: Semaglutide (1 MG/DOSE) Inject 1 mg into the skin once a week. Mondays   rOPINIRole 2 MG tablet Commonly known as: REQUIP Take 2 mg by mouth at bedtime.   spironolactone 50 MG tablet Commonly known as: ALDACTONE Take 50 mg by mouth 2 (two) times daily.   Toujeo Max SoloStar 300 UNIT/ML Solostar Pen Generic drug: insulin glargine (2 Unit Dial) Inject 40 Units into the skin daily.   valACYclovir 1000 MG  tablet Commonly known as: VALTREX Take 1,000 mg by mouth daily.   Vitamin D (Ergocalciferol) 1.25 MG (50000 UNIT) Caps capsule Commonly known as: DRISDOL Take 50,000 Units by mouth once a week.        Disposition and follow-up:   [name] was discharged from [facility] in Good condition.  At the hospital follow up visit please address:  1.  Follow-up: Hepatic encephalopathy/cirrhosis:  --Ensure patient taking lactulose with goal of 3 BM daily --Continue with Lasix and spironolactone  --We checked cost for rifaximin- copay of $800    AKI:  --Improved at discharge. Consider repeat BMP at f/u.  2.  Labs / imaging needed at time of follow-up: BMP  3.  Pending labs/ test needing follow-up: none  4.  Medication Changes  -Lactulose 30 g 4 times a day   Follow-up Appointments:  Follow-up Information     Destiny Fusi, MD. Schedule an appointment as soon as possible for a visit in 1 week(s).   Specialty: Internal Medicine Contact information: [address] [place] [address] [phone]                 Hospital Course by problem list: [name] is a 72 y.o. with a pertinent PMH of NASH cirrhosis, diabetes, chronic diastolic HF who presents with unsteadiness and admitted for hepatic encephalopathy.  #Hepatic encephalopathy 2/2 medication non-adherence #NASH cirrhosis Presented with confusion and  unsteadiness. Notably she has been nonadherent to lactulose over last few days PTA. Head CT normal. She was afebrile with normal WBC. No peritoneal signs. US abdomen showed cirrhosis without significant ascites. Restarted lactulose and titrated up to achieve 3 BM daily. Her mentation improved back to baseline. KUB done due to lack of BM and increased bloating but no findings of obstruction. She received lactulose enema with relief. Patient had BM at day of discharge and will leave on lactulose 30g 4 times a  day. Her home Lasix and spironolactone were continued during hospital course.    #AKI Presented with creatinine 1.34 on arrival. Appears baseline in January was 0.8-0.9. UA unremarkable besides glucose. Received IV fluids and encouraged po intake. AKI improved with creatinine at 1.08 on day of discharge.    #Chronic diastolic heart failure #Systolic heart murmur Last echo in 2021 with EF 60-65% with noted trace to mild mitral regurgitation. Does not appear volume overloaded. Her home Lasix was resumed during hospital course.    #T2DM on chronic insulin  Follows with endocrinology in outpatient setting. Resumed her long acting insulin dose of 40 units daily along with SSI. CBG improved with resuming home dose of long acting insulin.     Discharge Subjective: This morning she was nauseated and remains bloated since no BM. Still passing gas and taking the lactulose. Discussed no obstruction on KUB. She received lactulose enema this afternoon. Upon reassessment, had BM with improvement/resolution of her bloating and nausea. Discussed plan for discharge with patient and answered all questions.   Discharge Exam:   BP 127/68 (BP Location: Right Arm)   Pulse 89   Temp 98.3 F (36.8 C) (Oral)   Resp 18   Ht 5\' 3"  (1.6 m)   Wt 77.1 kg   SpO2 98%   BMI 30.11 kg/m  Constitutional: alert, sitting up in chair comfortably, in no acute distress Cardiovascular: regular rate and rhythm, systolic murmur appreciated Pulmonary/Chest: normal work of breathing on room air, lungs clear to auscultation bilaterally Abdominal: bowel sounds present, soft, improvement of mild diffuse tenderness and distention, no guarding or rebound MSK: normal bulk and tone Neurological: alert & oriented x 3, no asterixis   Skin: warm and dry Psych: pleasant mood  Pertinent Labs, Studies, and Procedures:     Latest Ref Rng & Units 01/01/2023   12:44 AM 12/30/2022    9:41 PM 12/30/2022    5:58 PM  CBC  WBC 4.0 - 10.5 K/uL  5.4  5.6    Hemoglobin 12.0 - 15.0 g/dL 16.1  09.6  04.5   Hematocrit 36.0 - 46.0 % 36.7  35.4  33.0   Platelets 150 - 400 K/uL 118  119         Latest Ref Rng & Units 01/03/2023    4:22 AM 01/02/2023    4:31 AM 01/01/2023   12:44 AM  CMP  Glucose 70 - 99 mg/dL 409  811  914   BUN 8 - 23 mg/dL 13  18  17    Creatinine 0.44 - 1.00 mg/dL 7.82  9.56  2.13   Sodium 135 - 145 mmol/L 136  136  138   Potassium 3.5 - 5.1 mmol/L 3.7  4.1  3.3   Chloride 98 - 111 mmol/L 104  103  107   CO2 22 - 32 mmol/L 21  22  19    Calcium 8.9 - 10.3 mg/dL 9.6  9.3  9.2     US Abdomen Limited  Result Date:  12/30/2022 CLINICAL DATA:  Cirrhosis.  Ascites. EXAM: ULTRASOUND ABDOMEN LIMITED RIGHT UPPER QUADRANT COMPARISON:  CT 03/06/2022 FINDINGS: Gallbladder: Physiologically distended. No gallstones or wall thickening visualized. No sonographic Murphy sign noted by sonographer. Common bile duct: Diameter: 2 mm, normal. Liver: Heterogeneous parenchyma with nodular contours. No discrete hepatic lesion. Borderline increased parenchymal echogenicity. Portal vein is patent on color Doppler imaging with normal direction of blood flow towards the liver. Other: No significant abdominal ascites. IMPRESSION: 1. Hepatic cirrhosis.  No evidence of focal lesion. 2. No significant ascites. Electronically Signed   By: Narda Rutherford M.D.   On: 12/30/2022 22:40   CT Head Wo Contrast  Result Date: 12/30/2022 CLINICAL DATA:  Mental status change, unknown cause EXAM: CT HEAD WITHOUT CONTRAST TECHNIQUE: Contiguous axial images were obtained from the base of the skull through the vertex without intravenous contrast. RADIATION DOSE REDUCTION: This exam was performed according to the departmental dose-optimization program which includes automated exposure control, adjustment of the mA and/or kV according to patient size and/or use of iterative reconstruction technique. COMPARISON:  CT head 02/14/2020. FINDINGS: Brain: No evidence of acute  infarction, hemorrhage, hydrocephalus, extra-axial collection or mass lesion/mass effect. Vascular: No hyperdense vessel or unexpected calcification. Skull: No acute fracture. Sinuses/Orbits: Clear sinuses.  No acute orbital findings. Other: No mastoid effusions. IMPRESSION: No evidence of acute intracranial abnormality. Electronically Signed   By: Feliberto Harts M.D.   On: 12/30/2022 19:23     Discharge Instructions: Discharge Instructions     Call MD for:  difficulty breathing, headache or visual disturbances   Complete by: As directed    Call MD for:  extreme fatigue   Complete by: As directed    Call MD for:  hives   Complete by: As directed    Call MD for:  persistant dizziness or light-headedness   Complete by: As directed    Call MD for:  persistant nausea and vomiting   Complete by: As directed    Call MD for:  redness, tenderness, or signs of infection (pain, swelling, redness, odor or green/yellow discharge around incision site)   Complete by: As directed    Call MD for:  severe uncontrolled pain   Complete by: As directed    Call MD for:  temperature >100.4   Complete by: As directed    Diet - low sodium heart healthy   Complete by: As directed    Increase activity slowly   Complete by: As directed        Signed: Rana Snare, DO 01/03/2023, 3:20 PM   Pager: [phone]

## 2023-01-03 NOTE — Telephone Encounter (Signed)
Patient Advocate Encounter   Received notification that prior authorization for Xifaxan 550MG  tablets is required.   PA submitted on 01/03/2023 Key [id number] Insurance [employer] Medicare Part D Electronic Prior Authorization Form Status is pending       Roland Earl, CPhT Pharmacy Patient Advocate Specialist [facility] Patient Advocate Team Direct Number: [phone]  Fax: [phone]

## 2023-01-03 NOTE — Telephone Encounter (Signed)
Patient Advocate Encounter  Prior Authorization for Xifaxan 550MG  tablets  has been approved.    PA# [id number] Insurance [employer] Medicare Part D Electronic Prior Authorization Form  Effective dates: 12/31/2022 through 07/25/2023  Patients co-pay is $821.08 due to being in the Coverage Gap (donut hole).     Roland Earl, CPhT Pharmacy Patient Advocate Specialist [facility] Patient Advocate Team Direct Number: [phone]  Fax: [phone]

## 2023-01-03 NOTE — TOC Transition Note (Signed)
Transition of Care [facility]) - CM/SW Discharge Note   Patient Details  Name: [name] MRN: [id number] Date of Birth: Nov 14, 1950  Transition of Care [facility]) CM/SW Contact:  Tom-Johnson, Hershal Coria, RN Phone Number: 01/03/2023, 4:18 PM   Clinical Narrative:     Patient is scheduled for discharge today.  Outpatient f/u, hospital f/u and discharge instructions on AVS. Prescriptions sent to [facility] pharmacy and meds will be delivered to patient at bedside prior discharge. No TOC needs or recommendations noted. Belinda Fisher to transport at discharge.  No further TOC needs noted.      Final next level of care: Home/Self Care Barriers to Discharge: Barriers Resolved   Patient Goals and CMS Choice CMS Medicare.gov Compare Post Acute Care list provided to:: Patient Choice offered to / list presented to : NA  Discharge Placement                  Patient to be transferred to facility by: Friend Name of family member notified: [name]    Discharge Plan and Services Additional resources added to the After Visit Summary for                  DME Arranged: N/A DME Agency: NA       HH Arranged: NA HH Agency: NA        Social Determinants of Health (SDOH) Interventions SDOH Screenings   Food Insecurity: No Food Insecurity (12/31/2022)  Housing: Low Risk  (12/31/2022)  Transportation Needs: No Transportation Needs (12/31/2022)  Utilities: Not At Risk (12/31/2022)  Tobacco Use: Medium Risk (12/31/2022)     Readmission Risk Interventions     No data to display

## 2023-01-03 NOTE — Progress Notes (Signed)
DC I nstructions discussed w/ pt and spouse regarding diet, F/U appointments, home meds, and when to call MD.  Pt verbalized understand.  Dc'd via wc w/ PCT to go home with spouse.

## 2023-01-03 NOTE — TOC Benefit Eligibility Note (Signed)
Patient Advocate Encounter  Insurance verification completed.    The patient is currently admitted and upon discharge could be taking Xifaxan 550 mg tablets.  Requires Prior Authorization  The patient is insured through [employer] Medicare Part D   This test claim was processed through [facility]- copay amounts may vary at other pharmacies due to pharmacy/plan contracts, or as the patient moves through the different stages of their insurance plan.  Koray Soter, CPHT Pharmacy Patient Advocate Specialist [facility] Patient Advocate Team Direct Number: [phone]  Fax: [phone]       

## 2023-01-07 DIAGNOSIS — R140 Abdominal distension (gaseous): Secondary | ICD-10-CM | POA: Diagnosis not present

## 2023-01-07 DIAGNOSIS — E7800 Pure hypercholesterolemia, unspecified: Secondary | ICD-10-CM | POA: Diagnosis not present

## 2023-01-07 DIAGNOSIS — G9341 Metabolic encephalopathy: Secondary | ICD-10-CM | POA: Diagnosis not present

## 2023-01-07 DIAGNOSIS — D696 Thrombocytopenia, unspecified: Secondary | ICD-10-CM | POA: Diagnosis not present

## 2023-01-07 DIAGNOSIS — Z7984 Long term (current) use of oral hypoglycemic drugs: Secondary | ICD-10-CM | POA: Diagnosis not present

## 2023-01-07 DIAGNOSIS — Z79899 Other long term (current) drug therapy: Secondary | ICD-10-CM | POA: Diagnosis not present

## 2023-01-07 DIAGNOSIS — E860 Dehydration: Secondary | ICD-10-CM | POA: Diagnosis not present

## 2023-01-07 DIAGNOSIS — A419 Sepsis, unspecified organism: Secondary | ICD-10-CM | POA: Diagnosis not present

## 2023-01-07 DIAGNOSIS — R531 Weakness: Secondary | ICD-10-CM | POA: Diagnosis not present

## 2023-01-07 DIAGNOSIS — R410 Disorientation, unspecified: Secondary | ICD-10-CM | POA: Diagnosis not present

## 2023-01-07 DIAGNOSIS — Z87442 Personal history of urinary calculi: Secondary | ICD-10-CM | POA: Diagnosis not present

## 2023-01-07 DIAGNOSIS — D509 Iron deficiency anemia, unspecified: Secondary | ICD-10-CM | POA: Diagnosis not present

## 2023-01-07 DIAGNOSIS — I10 Essential (primary) hypertension: Secondary | ICD-10-CM | POA: Diagnosis not present

## 2023-01-07 DIAGNOSIS — E1165 Type 2 diabetes mellitus with hyperglycemia: Secondary | ICD-10-CM | POA: Diagnosis not present

## 2023-01-07 DIAGNOSIS — R9431 Abnormal electrocardiogram [ECG] [EKG]: Secondary | ICD-10-CM | POA: Diagnosis not present

## 2023-01-07 DIAGNOSIS — R739 Hyperglycemia, unspecified: Secondary | ICD-10-CM | POA: Diagnosis not present

## 2023-01-07 DIAGNOSIS — Z1152 Encounter for screening for COVID-19: Secondary | ICD-10-CM | POA: Diagnosis not present

## 2023-01-07 DIAGNOSIS — Z8719 Personal history of other diseases of the digestive system: Secondary | ICD-10-CM | POA: Diagnosis not present

## 2023-01-07 DIAGNOSIS — Z91148 Patient's other noncompliance with medication regimen for other reason: Secondary | ICD-10-CM | POA: Diagnosis not present

## 2023-01-07 DIAGNOSIS — I498 Other specified cardiac arrhythmias: Secondary | ICD-10-CM | POA: Diagnosis not present

## 2023-01-07 DIAGNOSIS — F419 Anxiety disorder, unspecified: Secondary | ICD-10-CM | POA: Diagnosis not present

## 2023-01-07 DIAGNOSIS — Z87440 Personal history of urinary (tract) infections: Secondary | ICD-10-CM | POA: Diagnosis not present

## 2023-01-07 DIAGNOSIS — E8729 Other acidosis: Secondary | ICD-10-CM | POA: Diagnosis not present

## 2023-01-07 DIAGNOSIS — K7581 Nonalcoholic steatohepatitis (NASH): Secondary | ICD-10-CM | POA: Diagnosis not present

## 2023-01-07 DIAGNOSIS — F32A Depression, unspecified: Secondary | ICD-10-CM | POA: Diagnosis not present

## 2023-01-07 DIAGNOSIS — J449 Chronic obstructive pulmonary disease, unspecified: Secondary | ICD-10-CM | POA: Diagnosis not present

## 2023-01-07 DIAGNOSIS — Z794 Long term (current) use of insulin: Secondary | ICD-10-CM | POA: Diagnosis not present

## 2023-01-07 DIAGNOSIS — R4182 Altered mental status, unspecified: Secondary | ICD-10-CM | POA: Diagnosis not present

## 2023-01-13 ENCOUNTER — Encounter: Payer: Self-pay | Admitting: Oncology

## 2023-01-13 ENCOUNTER — Observation Stay (HOSPITAL_COMMUNITY): Payer: Medicare Other

## 2023-01-13 ENCOUNTER — Other Ambulatory Visit: Payer: Self-pay

## 2023-01-13 ENCOUNTER — Observation Stay (HOSPITAL_COMMUNITY)
Admission: EM | Admit: 2023-01-13 | Discharge: 2023-01-14 | Disposition: A | Discharge status: Home / Self Care | Payer: Medicare Other | Attending: Internal Medicine | Admitting: Internal Medicine

## 2023-01-13 ENCOUNTER — Encounter (HOSPITAL_COMMUNITY): Payer: Self-pay

## 2023-01-13 DIAGNOSIS — I5032 Chronic diastolic (congestive) heart failure: Secondary | ICD-10-CM | POA: Insufficient documentation

## 2023-01-13 DIAGNOSIS — I110 Hypertensive heart disease with heart failure: Secondary | ICD-10-CM | POA: Insufficient documentation

## 2023-01-13 DIAGNOSIS — E119 Type 2 diabetes mellitus without complications: Secondary | ICD-10-CM | POA: Diagnosis not present

## 2023-01-13 DIAGNOSIS — Z7984 Long term (current) use of oral hypoglycemic drugs: Secondary | ICD-10-CM | POA: Diagnosis not present

## 2023-01-13 DIAGNOSIS — K760 Fatty (change of) liver, not elsewhere classified: Secondary | ICD-10-CM | POA: Diagnosis not present

## 2023-01-13 DIAGNOSIS — K7290 Hepatic failure, unspecified without coma: Secondary | ICD-10-CM | POA: Diagnosis not present

## 2023-01-13 DIAGNOSIS — R2689 Other abnormalities of gait and mobility: Secondary | ICD-10-CM | POA: Insufficient documentation

## 2023-01-13 DIAGNOSIS — K7682 Hepatic encephalopathy: Principal | ICD-10-CM | POA: Insufficient documentation

## 2023-01-13 DIAGNOSIS — R4182 Altered mental status, unspecified: Secondary | ICD-10-CM | POA: Diagnosis present

## 2023-01-13 DIAGNOSIS — E860 Dehydration: Secondary | ICD-10-CM | POA: Diagnosis not present

## 2023-01-13 DIAGNOSIS — K7581 Nonalcoholic steatohepatitis (NASH): Secondary | ICD-10-CM | POA: Diagnosis not present

## 2023-01-13 DIAGNOSIS — K7460 Unspecified cirrhosis of liver: Secondary | ICD-10-CM | POA: Diagnosis not present

## 2023-01-13 DIAGNOSIS — Z79899 Other long term (current) drug therapy: Secondary | ICD-10-CM | POA: Diagnosis not present

## 2023-01-13 DIAGNOSIS — Z794 Long term (current) use of insulin: Secondary | ICD-10-CM | POA: Diagnosis not present

## 2023-01-13 DIAGNOSIS — G9341 Metabolic encephalopathy: Secondary | ICD-10-CM | POA: Diagnosis not present

## 2023-01-13 DIAGNOSIS — E1129 Type 2 diabetes mellitus with other diabetic kidney complication: Secondary | ICD-10-CM | POA: Diagnosis not present

## 2023-01-13 DIAGNOSIS — R809 Proteinuria, unspecified: Secondary | ICD-10-CM | POA: Diagnosis not present

## 2023-01-13 DIAGNOSIS — I10 Essential (primary) hypertension: Secondary | ICD-10-CM | POA: Diagnosis not present

## 2023-01-13 LAB — COMPREHENSIVE METABOLIC PANEL
ALT: 32 U/L (ref 0–44)
AST: 36 U/L (ref 15–41)
Albumin: 3.6 g/dL (ref 3.5–5.0)
Alkaline Phosphatase: 59 U/L (ref 38–126)
Anion gap: 11 (ref 5–15)
BUN: 15 mg/dL (ref 8–23)
CO2: 22 mmol/L (ref 22–32)
Calcium: 10.4 mg/dL — ABNORMAL HIGH (ref 8.9–10.3)
Chloride: 107 mmol/L (ref 98–111)
Creatinine, Ser: 1.09 mg/dL — ABNORMAL HIGH (ref 0.44–1.00)
GFR, Estimated: 54 mL/min — ABNORMAL LOW (ref >60)
Glucose, Bld: 140 mg/dL — ABNORMAL HIGH (ref 70–99)
Potassium: 3.6 mmol/L (ref 3.5–5.1)
Sodium: 140 mmol/L (ref 135–145)
Total Bilirubin: 1.5 mg/dL — ABNORMAL HIGH (ref 0.3–1.2)
Total Protein: 6.5 g/dL (ref 6.5–8.1)

## 2023-01-13 LAB — CBC
HCT: 34.3 % — ABNORMAL LOW (ref 36.0–46.0)
Hemoglobin: 11.9 g/dL — ABNORMAL LOW (ref 12.0–15.0)
MCH: 32.6 pg (ref 26.0–34.0)
MCHC: 34.7 g/dL (ref 30.0–36.0)
MCV: 94 fL (ref 80.0–100.0)
Platelets: 117 10*3/uL — ABNORMAL LOW (ref 150–400)
RBC: 3.65 MIL/uL — ABNORMAL LOW (ref 3.87–5.11)
RDW: 13.6 % (ref 11.5–15.5)
WBC: 6.8 10*3/uL (ref 4.0–10.5)
nRBC: 0 % (ref 0.0–0.2)

## 2023-01-13 LAB — AMMONIA: Ammonia: 145 umol/L — ABNORMAL HIGH (ref 9–35)

## 2023-01-13 LAB — LIPASE, BLOOD: Lipase: 36 U/L (ref 11–51)

## 2023-01-13 LAB — GLUCOSE, CAPILLARY: Glucose-Capillary: 243 mg/dL — ABNORMAL HIGH (ref 70–99)

## 2023-01-13 LAB — CBG MONITORING, ED: Glucose-Capillary: 145 mg/dL — ABNORMAL HIGH (ref 70–99)

## 2023-01-13 MED ORDER — SODIUM CHLORIDE 0.9 % IV BOLUS (SEPSIS)
500.0000 mL | Freq: Once | INTRAVENOUS | Status: AC
  Administered 2023-01-13: 500 mL via INTRAVENOUS

## 2023-01-13 MED ORDER — INSULIN ASPART 100 UNIT/ML IJ SOLN
0.0000 [IU] | Freq: Every day | INTRAMUSCULAR | Status: DC
  Administered 2023-01-13: 2 [IU] via SUBCUTANEOUS

## 2023-01-13 MED ORDER — SPIRONOLACTONE 25 MG PO TABS
50.0000 mg | ORAL_TABLET | Freq: Two times a day (BID) | ORAL | Status: DC
  Administered 2023-01-13 – 2023-01-14 (×2): 50 mg via ORAL
  Filled 2023-01-13 (×2): qty 2

## 2023-01-13 MED ORDER — LACTULOSE 10 GM/15ML PO SOLN
30.0000 g | Freq: Three times a day (TID) | ORAL | Status: DC
  Administered 2023-01-14: 30 g via ORAL
  Filled 2023-01-13: qty 45

## 2023-01-13 MED ORDER — LACTULOSE 10 GM/15ML PO SOLN
30.0000 g | Freq: Once | ORAL | Status: AC
  Administered 2023-01-13: 30 g via ORAL
  Filled 2023-01-13: qty 60

## 2023-01-13 MED ORDER — SODIUM CHLORIDE 0.9 % IV SOLN
1000.0000 mL | INTRAVENOUS | Status: DC
  Administered 2023-01-13 – 2023-01-14 (×3): 1000 mL via INTRAVENOUS

## 2023-01-13 MED ORDER — LACTULOSE ENEMA
300.0000 mL | Freq: Once | ORAL | Status: DC
  Filled 2023-01-13: qty 300

## 2023-01-13 MED ORDER — INSULIN ASPART 100 UNIT/ML IJ SOLN
0.0000 [IU] | Freq: Three times a day (TID) | INTRAMUSCULAR | Status: DC
  Administered 2023-01-14: 8 [IU] via SUBCUTANEOUS
  Administered 2023-01-14: 3 [IU] via SUBCUTANEOUS

## 2023-01-13 MED ORDER — ENOXAPARIN SODIUM 40 MG/0.4ML IJ SOSY
40.0000 mg | PREFILLED_SYRINGE | INTRAMUSCULAR | Status: DC
  Administered 2023-01-13: 40 mg via SUBCUTANEOUS
  Filled 2023-01-13: qty 0.4

## 2023-01-13 MED ORDER — FUROSEMIDE 40 MG PO TABS
40.0000 mg | ORAL_TABLET | Freq: Every day | ORAL | Status: DC
  Administered 2023-01-14: 40 mg via ORAL
  Filled 2023-01-13: qty 1

## 2023-01-13 NOTE — ED Triage Notes (Signed)
Pt arrived to ED via POV. Pt lethargic. Pt has cirrhosis of the liver and last time she was seen her ammonia was elevated. PCP told pt's nephew to bring pt to ED.

## 2023-01-13 NOTE — ED Provider Notes (Signed)
[place] EMERGENCY DEPARTMENT AT [facility] Provider Note   CSN: [id number] Arrival date & time: 01/13/23  1239     History  Chief Complaint  Patient presents with   Altered Mental Status    Hx of cirrhosis and hepatic encephalopathy     [name] is a 72 y.o. female.   Altered Mental Status    Patient has a history of hypertension hyperlipidemia, diabetes, hypertension, aortic stenosis, cirrhosis of the liver who presents to the ED today for fatigue.  According to the patient's significant other patient was fine last evening.  He went to go play golf today and she had a scheduled appointment with her doctor to follow-up on her recent hospital admission for hepatic encephalopathy.  At the doctor's office patient was noted to be very fatigued and lethargic.  She was sent to the hospital for further evaluation.  Patient denies any recent fevers or chills.  She states she has been taking her migraine medications regularly.  Home Medications Prior to Admission medications   Medication Sig Start Date End Date Taking? Authorizing Provider  albuterol (VENTOLIN HFA) 108 (90 Base) MCG/ACT inhaler Inhale 2 puffs into the lungs as needed for wheezing or shortness of breath. 11/24/20   [provider]  furosemide (LASIX) 40 MG tablet Take 40 mg by mouth daily.    [provider]  insulin aspart (NOVOLOG) 100 UNIT/ML FlexPen Inject 30 Units into the skin 2 (two) times daily.    [provider]  lactulose (CHRONULAC) 10 GM/15ML solution Take 45 mLs (30 g total) by mouth 4 (four) times daily. 01/03/23 02/02/23  Rana Snare, DO  metFORMIN (GLUCOPHAGE-XR) 500 MG 24 hr tablet Take 500 mg by mouth 2 (two) times daily with a meal. 04/14/18   [provider]  ondansetron (ZOFRAN) 4 MG tablet Take 1 tablet (4 mg total) by mouth every 4 (four) hours as needed for nausea. Patient not taking: Reported on 12/30/2022 01/18/22   [name], NP   OZEMPIC, 1 MG/DOSE, 4 MG/3ML SOPN Inject 1 mg into the skin once a week. Mondays    [provider]  rOPINIRole (REQUIP) 2 MG tablet Take 2 mg by mouth at bedtime. 06/01/18   [provider]  spironolactone (ALDACTONE) 50 MG tablet Take 50 mg by mouth 2 (two) times daily. 03/24/22   [provider]  TOUJEO MAX SOLOSTAR 300 UNIT/ML Solostar Pen Inject 40 Units into the skin daily.    [provider]  valACYclovir (VALTREX) 1000 MG tablet Take 1,000 mg by mouth daily. 05/01/20   [provider]  Vitamin D, Ergocalciferol, (DRISDOL) 1.25 MG (50000 UT) CAPS capsule Take 50,000 Units by mouth once a week.    [provider]      Allergies    Patient has no known allergies.    Review of Systems   Review of Systems  Physical Exam Updated Vital Signs BP (!) 154/65 (BP Location: Right Arm)   Pulse 90   Temp 98.6 F (37 C)   Resp 17   Ht 1.6 m (5\' 3" )   Wt 71.7 kg   SpO2 94%   BMI 27.99 kg/m  Physical Exam Vitals and nursing note reviewed.  Constitutional:      Appearance: She is well-developed. She is not diaphoretic.  HENT:     Head: Normocephalic and atraumatic.     Right Ear: External ear normal.     Left Ear: External ear normal.  Eyes:     General: No scleral icterus.       Right eye: No discharge.        Left eye: No discharge.     Conjunctiva/sclera: Conjunctivae normal.  Neck:     Trachea: No tracheal deviation.  Cardiovascular:     Rate and Rhythm: Normal rate and regular rhythm.  Pulmonary:     Effort: Pulmonary effort is normal. No respiratory distress.     Breath sounds: Normal breath sounds. No stridor. No wheezing or rales.  Abdominal:     General: Bowel sounds are normal. There is no distension.     Palpations: Abdomen is soft.     Tenderness: There is no abdominal tenderness. There is no guarding or rebound.  Musculoskeletal:        General: No tenderness or deformity.     Cervical back: Neck supple.   Skin:    General: Skin is warm and dry.     Findings: No rash.  Neurological:     General: No focal deficit present.     Mental Status: She is lethargic.     Cranial Nerves: No cranial nerve deficit, dysarthria or facial asymmetry.     Sensory: No sensory deficit.     Motor: No abnormal muscle tone or seizure activity.     Coordination: Coordination normal.  Psychiatric:        Mood and Affect: Mood normal.     ED Results / Procedures / Treatments   Labs (all labs ordered are listed, but only abnormal results are displayed) Labs Reviewed  COMPREHENSIVE METABOLIC PANEL - Abnormal; Notable for the following components:      Result Value   Glucose, Bld 140 (*)    Creatinine, Ser 1.09 (*)    Calcium 10.4 (*)    Total Bilirubin 1.5 (*)    GFR, Estimated 54 (*)    All other components within normal limits  CBC - Abnormal; Notable for the following components:   RBC 3.65 (*)    Hemoglobin 11.9 (*)    HCT 34.3 (*)    Platelets 117 (*)    All other components within normal limits  AMMONIA - Abnormal; Notable for the following components:   Ammonia 145 (*)    All other components within normal limits  CBG MONITORING, ED - Abnormal; Notable for the following components:   Glucose-Capillary 145 (*)    All other components within normal limits  LIPASE, BLOOD  URINALYSIS, ROUTINE W REFLEX MICROSCOPIC    EKG EKG Interpretation  Date/Time:  Friday January 13 2023 14:26:24 EDT Ventricular Rate:  91 PR Interval:  165 QRS Duration: 91 QT Interval:  433 QTC Calculation: 533 R Axis:   4 Text Interpretation: Sinus rhythm Consider anterior infarct Prolonged QT interval No significant change since last tracing Confirmed by Linwood Dibbles [phone]) on 01/13/2023 3:32:39 PM  Radiology No results found.  Procedures Procedures    Medications Ordered in ED Medications  sodium chloride 0.9 % bolus 500 mL (has no administration in time range)    Followed by  0.9 %  sodium chloride infusion  (has no administration in time range)  lactulose (CHRONULAC) 10 GM/15ML solution 30 g (has no administration in time range)    ED Course/ Medical Decision Making/ A&P Clinical Course as of 01/13/23 2324  Fri Jan 13, 2023  1528 CBC shows slight decrease in hemoglobin similar to baseline.  Patient's metabolic panel shows elevated calcium and bilirubin [JK]  1529 Ammonia(!) Ammonia  is elevated compared to previous values [JK]  1605 Case reviewed with Dr August Saucer regarding admission [JK]    Clinical Course User Index [JK] Linwood Dibbles, MD                             Medical Decision Making Problems Addressed: Hepatic encephalopathy [facility]): acute illness or injury that poses a threat to life or bodily functions  Amount and/or Complexity of Data Reviewed Labs: ordered. Decision-making details documented in ED Course.  Risk Prescription drug management. Decision regarding hospitalization.   Patient presents to the ED for evaluation of altered mental status, lethargy.  Patient has history of hepatic encephalopathy and was admitted to hospital earlier this month for the same symptoms.  On exam patient is alert and oriented however is listless.  No signs of any acute infection.  Patient does have elevated ammonia level of 145.  This is increased from her previous value.  This appears to be the cause of her current lethargy.  No clear precipitating factor at this time as patient states she has been compliant with her medications.  No signs of acute infection.  Will check urinalysis and continue to monitor.  I have ordered a dose of lactulose and I will consult medical service for admission        Final Clinical Impression(s) / ED Diagnoses Final diagnoses:  Hepatic encephalopathy [facility])    Rx / DC Orders ED Discharge Orders     None         Linwood Dibbles, MD 01/13/23 2324

## 2023-01-13 NOTE — ED Notes (Signed)
ED TO INPATIENT HANDOFF REPORT  ED Nurse Name and Phone #: [phone]  S Name/Age/Gender [name] 72 y.o. female Room/Bed: 029C/029C  Code Status   Code Status: Full Code  Home/SNF/Other Home Patient oriented to: self, place, and time Is this baseline? Yes   Triage Complete: Triage complete  Chief Complaint Hepatic encephalopathy (HCC) [K76.82]  Triage Note Pt arrived to ED via POV. Pt lethargic. Pt has cirrhosis of the liver and last time she was seen her ammonia was elevated. PCP told pt's nephew to bring pt to ED.   Allergies No Known Allergies  Level of Care/Admitting Diagnosis ED Disposition     ED Disposition  Admit   Condition  --   Comment  Hospital Area: [facility] [[id number]]  Level of Care: Med-Surg [16]  May place patient in observation at [facility] or [facility] if equivalent level of care is available:: No  Covid Evaluation: Asymptomatic - no recent exposure (last 10 days) testing not required  Diagnosis: Hepatic encephalopathy (HCC) [572.2.ICD-9-CM]  Admitting Physician: Dickie La [[id number]]  Attending Physician: Dickie La [[id number]]          B Medical/Surgery History Past Medical History:  Diagnosis Date   Anemia 07/02/2018   Aortic stenosis, mild 09/03/2018   Cirrhosis of liver without ascites (HCC) 07/03/2019   Diabetes mellitus due to underlying condition with unspecified complications (HCC) 07/02/2018   Diabetes mellitus without complication (HCC)    Essential hypertension 07/02/2018   Hematochezia 06/20/2016   History of colonic polyps 02/18/2019   Hyperlipidemia    Hypertension    Iron deficiency anemia 06/20/2016   Iron deficiency anemia, unspecified    RUQ pain 06/20/2016   Tightness in chest 09/03/2018   Past Surgical History:  Procedure Laterality Date   ABDOMINAL HYSTERECTOMY     BACK SURGERY       A IV Location/Drains/Wounds Patient Lines/Drains/Airways Status     Active Line/Drains/Airways      Name Placement date Placement time Site Days   Peripheral IV 01/13/23 20 G Anterior;Proximal;Right Forearm 01/13/23  1656  Forearm  less than 1            Intake/Output Last 24 hours No intake or output data in the 24 hours ending 01/13/23 1706  Labs/Imaging Results for orders placed or performed during the hospital encounter of 01/13/23 (from the past 48 hour(s))  CBG monitoring, ED     Status: Abnormal   Collection Time: 01/13/23 12:52 PM  Result Value Ref Range   Glucose-Capillary 145 (H) 70 - 99 mg/dL    Comment: Glucose reference range applies only to samples taken after fasting for at least 8 hours.  Lipase, blood     Status: None   Collection Time: 01/13/23  1:15 PM  Result Value Ref Range   Lipase 36 11 - 51 U/L    Comment: Performed at [facility], [address], [place], [place] [address]  Comprehensive metabolic panel     Status: Abnormal   Collection Time: 01/13/23  1:15 PM  Result Value Ref Range   Sodium 140 135 - 145 mmol/L   Potassium 3.6 3.5 - 5.1 mmol/L   Chloride 107 98 - 111 mmol/L   CO2 22 22 - 32 mmol/L   Glucose, Bld 140 (H) 70 - 99 mg/dL    Comment: Glucose reference range applies only to samples taken after fasting for at least 8 hours.   BUN 15 8 - 23 mg/dL  Creatinine, Ser 1.09 (H) 0.44 - 1.00 mg/dL   Calcium 78.2 (H) 8.9 - 10.3 mg/dL   Total Protein 6.5 6.5 - 8.1 g/dL   Albumin 3.6 3.5 - 5.0 g/dL   AST 36 15 - 41 U/L   ALT 32 0 - 44 U/L   Alkaline Phosphatase 59 38 - 126 U/L   Total Bilirubin 1.5 (H) 0.3 - 1.2 mg/dL   GFR, Estimated 54 (L) >60 mL/min    Comment: (NOTE) Calculated using the CKD-EPI Creatinine Equation (2021)    Anion gap 11 5 - 15    Comment: Performed at [facility], [address], [place], [place] [address]  CBC     Status: Abnormal   Collection Time: 01/13/23  1:15 PM  Result Value Ref Range   WBC 6.8 4.0 - 10.5 K/uL   RBC 3.65 (L) 3.87 - 5.11 MIL/uL   Hemoglobin 11.9 (L) 12.0 - 15.0 g/dL   HCT  30.8 (L) 65.7 - 46.0 %   MCV 94.0 80.0 - 100.0 fL   MCH 32.6 26.0 - 34.0 pg   MCHC 34.7 30.0 - 36.0 g/dL   RDW 84.6 96.2 - 95.2 %   Platelets 117 (L) 150 - 400 K/uL   nRBC 0.0 0.0 - 0.2 %    Comment: Performed at [facility], [address], [place], [place] [address]  Ammonia     Status: Abnormal   Collection Time: 01/13/23  1:15 PM  Result Value Ref Range   Ammonia 145 (H) 9 - 35 umol/L    Comment: Performed at [facility], [address] [place], [place] [address]   No results found.  Pending Labs Unresulted Labs (From admission, onward)     Start     Ordered   01/14/23 0500  Comprehensive metabolic panel  Tomorrow morning,   R        01/13/23 1657   01/14/23 0500  CBC  Tomorrow morning,   R        01/13/23 1657   01/13/23 1315  Urinalysis, Routine w reflex microscopic -Urine, Clean Catch  Once,   URGENT       Question:  Specimen Source  Answer:  Urine, Clean Catch   01/13/23 1314            Vitals/Pain Today's Vitals   01/13/23 1313 01/13/23 1445 01/13/23 1500 01/13/23 1645  BP:  (!) 149/61 (!) 154/65 (!) 161/71  Pulse:  92 90 93  Resp:  16 17 16   Temp:      SpO2:  98% 94% 98%  Weight: 71.7 kg     Height: 5\' 3"  (1.6 m)     PainSc: 0-No pain       Isolation Precautions No active isolations  Medications Medications  sodium chloride 0.9 % bolus 500 mL (has no administration in time range)    Followed by  0.9 %  sodium chloride infusion (1,000 mLs Intravenous New Bag/Given 01/13/23 1705)  lactulose (CHRONULAC) 10 GM/15ML solution 30 g (has no administration in time range)  enoxaparin (LOVENOX) injection 40 mg (has no administration in time range)    Mobility non-ambulatory     Focused Assessments Neuro Assessment Handoff:  Swallow screen pass? Yes          Neuro Assessment:   Neuro Checks:      Has TPA been given? No If patient is a Neuro Trauma and patient is going to OR before floor call report  to 4N Charge nurse: [phone]  or [phone]   R Recommendations: See Admitting Provider Note  Report given to:   Additional Notes:

## 2023-01-13 NOTE — H&P (Signed)
Date: 01/13/2023               Patient Name:  [name] MRN: [id number]  DOB: 1950-08-04 Age / Sex: 72 y.o., female   PCP: Paulina Fusi, MD         Medical Service: Internal Medicine Teaching Service         Attending Physician: Dr. Dickie La, MD      First Contact: Dr. Rana Snare, DO Pager [phone]    Second Contact: Dr. Champ Mungo, DO Pager [phone]         After Hours (After 5p/  First Contact Pager: [phone]  weekends / holidays): Second Contact Pager: [phone]   SUBJECTIVE   Chief Complaint: Generalized weakness  History of Present Illness: 72 y.o. female with PMH of non-alcoholic cirrhosis with hx prior hepatic encephalopathy, T2DM, HFpEF presents for weakness and fatigue.   She was recently discharged from [facility] on 611 for hepatic encephalopathy due to medication nonadherence.  Discharged on lactulose 30 g 4 times daily.  Her friend was present at bedside and provided additional information.  States patient was feeling overall okay yesterday and had dinner out with him.  This morning she was feeling weaker and more fatigued.  Saw her PCP and they recommended going to ER.  Does state yesterday she felt that her abdomen was more bloated.  Last bowel movement was yesterday and she has not taken any lactulose today. Had 2 episodes of emesis that she described as brown. That has not happened before. Denies any recent NSAID use. No nausea or vomiting at this time.   Denies any fever or chills. Denies cough, dysuria or diarrhea.  No sick contacts and close friend states he is feeling well.  ED Course: Presented to ED with BP elevated 167/69, afebrile and satting well on room air.  Labs significant for ammonia elevated 145.  No leukocytosis.  IMTS consulted for admission.  Past Medical History -Nonalcoholic cirrhosis -Chronic diastolic heart failure -HTN -HLD -T2DM -Aortic stenosis  Meds:  -Albuterol inhaler -Lasix 40 mg daily -NovoLog 30 units twice  daily -Lactulose 30 g 4 times daily -Metformin 500 twice daily -Ozempic 1 mg weekly -Ropinirole 2 mg daily  -Spironolactone 50 twice daily -Toujeo 40 units daily -Vitamin D No outpatient medications have been marked as taking for the 01/13/23 encounter [facility] Encounter).    Past Surgical History Past Surgical History:  Procedure Laterality Date   ABDOMINAL HYSTERECTOMY     BACK SURGERY     Social:  Lives With: Friend in [place] Support: Friend Level of Function: independent with ADLs PCP: Paulina Fusi, MD Substances: Denies current alcohol, tobacco or illicit drug use  Family History:  Family History  Problem Relation Age of Onset   Heart disease Mother    Alzheimer's disease Mother    Cancer Father    Lymphoma Father    Parkinson's disease Paternal Grandmother    Kidney cancer Paternal Uncle    Allergies: Allergies as of 01/13/2023   (No Known Allergies)    Review of Systems: A complete ROS was negative except as per HPI.   OBJECTIVE:   Physical Exam: Blood pressure (!) 154/65, pulse 90, temperature 98.6 F (37 C), resp. rate 17, height 5\' 3"  (1.6 m), weight 71.7 kg, SpO2 94 %.  Constitutional: drowsy but easily roused, at times restless lying in bed, in no acute distress HENT: normocephalic atraumatic Cardiovascular: regular rate and rhythm, systolic murmur noted  Pulmonary/Chest: normal work of  breathing on room air, lungs clear to auscultation bilaterally Abdominal: bowel sounds present, soft, distention, minimal diffuse tenderness without guarding or rebound Neurological: awake, 4/5 strength and equal in all extremities, repetitive speech at times, asterixis noted, no focal deficits  Skin: warm and dry  Labs: CBC    Component Value Date/Time   WBC 6.8 01/13/2023 1315   RBC 3.65 (L) 01/13/2023 1315   HGB 11.9 (L) 01/13/2023 1315   HCT 34.3 (L) 01/13/2023 1315   PLT 117 (L) 01/13/2023 1315   MCV 94.0 01/13/2023 1315   MCV 98 11/26/2020 0000    MCH 32.6 01/13/2023 1315   MCHC 34.7 01/13/2023 1315   RDW 13.6 01/13/2023 1315   LYMPHSABS 0.9 12/30/2022 1618   MONOABS 0.4 12/30/2022 1618   EOSABS 0.0 12/30/2022 1618   BASOSABS 0.0 12/30/2022 1618     CMP     Component Value Date/Time   NA 140 01/13/2023 1315   NA 140 08/03/2022 0000   K 3.6 01/13/2023 1315   CL 107 01/13/2023 1315   CO2 22 01/13/2023 1315   GLUCOSE 140 (H) 01/13/2023 1315   BUN 15 01/13/2023 1315   BUN 14 08/03/2022 0000   CREATININE 1.09 (H) 01/13/2023 1315   CALCIUM 10.4 (H) 01/13/2023 1315   PROT 6.5 01/13/2023 1315   ALBUMIN 3.6 01/13/2023 1315   AST 36 01/13/2023 1315   ALT 32 01/13/2023 1315   ALKPHOS 59 01/13/2023 1315   BILITOT 1.5 (H) 01/13/2023 1315   GFRNONAA 54 (L) 01/13/2023 1315   GFRAA NOT CALCULATED 04/18/2011 0607    Imaging: none  EKG: personally reviewed my interpretation is sinus rhythm, rate 91, normal axis, prolonged QT with QTc 533. Prior EKG did not note prolonged QT.  ASSESSMENT & PLAN:   Assessment & Plan by Problem: Principal Problem:   Hepatic encephalopathy (HCC)   [name] is a 72 y.o. person living with a history of PMH of non-alcoholic cirrhosis with hx prior hepatic encephalopathy, T2DM, HFpEF who presented with weakness and fatigue and admitted for hepatic encephalopathy on hospital day 0  #Hepatic encephalopathy #Nonalcoholic cirrhosis  Patient presents with similar presentation to prior hospitalization for hepatic encephalopathy. Ammonia elevated to 145.  Patient states adherence to lactulose at least 2 or 3 times a day but has not taken it today. Just received 30 g of lactulose in ED.  US abdomen from last admission did not show significant ascites.  Her generalized weakness and fatigue could be in setting of panic encephalopathy. No electrolyte derangements. Will check TSH and B12. Denies recent illness, respiratory symptoms, dysuria or urinary retention. She is afebrile with normal WBC. No  focal deficits noted or acute change per patient and friend. Denies any recent medication changes and unlikely other toxic agents.   -order US abdomen -Lactulose enema tonight, then start po lactulose 30 g   -Goal of 3 BM daily  -Continue spironolactone 50 mg BID -Continue Lasix 40 mg daily -Check TSH and B12 -PT and OT eval  #Emesis Reports 2 episodes of emesis that she states appeared brown.  No further nausea or vomiting.  Her hemoglobin appears stable at 11.9 which is close to baseline. EGD in 2022 without esophageal varices noted. Will monitor for now. -monitor for further emesis, if concern for coffee ground or hematemesis, repeat CBC and further workup  #HFpEF #Systolic murmur Previous echo showed normal EF but noted mild aortic stenosis along with mild mitral regurgitation.  Does not appear volume overloaded today.  #  T2DM on insulin A1c 7.9% earlier this month.  PTA on Toujeo 40 units daily and reported NovoLog 30 units twice daily. -CBG monitoring -SSI-M  Diet: Normal VTE: Enoxaparin IVF: None,None Code: Full  Prior to Admission Living Arrangement: Home, living with friend Anticipated Discharge Location:  TBD Barriers to Discharge: encephalopathy  Dispo: Admit patient to Observation with expected length of stay less than 2 midnights.  Signed: Rana Snare, DO Internal Medicine Resident PGY-1 01/13/2023, 5:13 PM  Please contact on call pager, weekdays after 5pm and weekends after 1pm, at [phone].

## 2023-01-14 DIAGNOSIS — K7682 Hepatic encephalopathy: Secondary | ICD-10-CM

## 2023-01-14 LAB — VITAMIN B12: Vitamin B-12: 583 pg/mL (ref 180–914)

## 2023-01-14 LAB — CBC
HCT: 31.1 % — ABNORMAL LOW (ref 36.0–46.0)
Hemoglobin: 10.4 g/dL — ABNORMAL LOW (ref 12.0–15.0)
MCH: 31.7 pg (ref 26.0–34.0)
MCHC: 33.4 g/dL (ref 30.0–36.0)
MCV: 94.8 fL (ref 80.0–100.0)
Platelets: 106 10*3/uL — ABNORMAL LOW (ref 150–400)
RBC: 3.28 MIL/uL — ABNORMAL LOW (ref 3.87–5.11)
RDW: 13.8 % (ref 11.5–15.5)
WBC: 5.3 10*3/uL (ref 4.0–10.5)
nRBC: 0 % (ref 0.0–0.2)

## 2023-01-14 LAB — COMPREHENSIVE METABOLIC PANEL
ALT: 26 U/L (ref 0–44)
AST: 30 U/L (ref 15–41)
Albumin: 2.9 g/dL — ABNORMAL LOW (ref 3.5–5.0)
Alkaline Phosphatase: 46 U/L (ref 38–126)
Anion gap: 8 (ref 5–15)
BUN: 15 mg/dL (ref 8–23)
CO2: 23 mmol/L (ref 22–32)
Calcium: 9.4 mg/dL (ref 8.9–10.3)
Chloride: 110 mmol/L (ref 98–111)
Creatinine, Ser: 1.25 mg/dL — ABNORMAL HIGH (ref 0.44–1.00)
GFR, Estimated: 46 mL/min — ABNORMAL LOW (ref >60)
Glucose, Bld: 187 mg/dL — ABNORMAL HIGH (ref 70–99)
Potassium: 3.3 mmol/L — ABNORMAL LOW (ref 3.5–5.1)
Sodium: 141 mmol/L (ref 135–145)
Total Bilirubin: 1.7 mg/dL — ABNORMAL HIGH (ref 0.3–1.2)
Total Protein: 5.4 g/dL — ABNORMAL LOW (ref 6.5–8.1)

## 2023-01-14 LAB — TSH: TSH: 1.542 u[IU]/mL (ref 0.350–4.500)

## 2023-01-14 LAB — GLUCOSE, CAPILLARY
Glucose-Capillary: 155 mg/dL — ABNORMAL HIGH (ref 70–99)
Glucose-Capillary: 254 mg/dL — ABNORMAL HIGH (ref 70–99)

## 2023-01-14 LAB — MAGNESIUM: Magnesium: 2.1 mg/dL (ref 1.7–2.4)

## 2023-01-14 MED ORDER — POTASSIUM CHLORIDE CRYS ER 20 MEQ PO TBCR
40.0000 meq | EXTENDED_RELEASE_TABLET | Freq: Once | ORAL | Status: AC
  Administered 2023-01-14: 40 meq via ORAL
  Filled 2023-01-14: qty 2

## 2023-01-14 NOTE — Evaluation (Signed)
Physical Therapy Evaluation Patient Details Name: [name] MRN: [id number] DOB: [birth year] Today's Date: 01/14/2023  History of Present Illness  Pt is a 72 y/o female admitted secondary to weakness. Pt found to have hepatic encephalopathy secondary to nonalcoholic cirrhosis. PMH including but not limited to NASH cirrhosis, DM and CHF.   Clinical Impression  Pt presented supine in bed with HOB elevated, awake and willing to participate in therapy session. Prior to admission, pt reported that she was independent with all functional mobility and ADLs. Pt enjoys walking her small dog and gardening. Pt lives in two level home with a ramped entrance. She stated that she has a stair lift but typically never needs to go upstairs. At the time of evaluation, pt overall moving well at a mod I to supervision level including hallway ambulation with no AD. Pt reporting that she feels weaker than her normal self, would consider OPPT. Pt would continue to benefit from skilled physical therapy services at this time while admitted and after d/c to address the below listed limitations in order to improve overall safety and independence with functional mobility.      Recommendations for follow up therapy are one component of a multi-disciplinary discharge planning process, led by the attending physician.  Recommendations may be updated based on patient status, additional functional criteria and insurance authorization.  Follow Up Recommendations       Assistance Recommended at Discharge PRN  Patient can return home with the following  Assistance with cooking/housework    Equipment Recommendations None recommended by PT  Recommendations for Other Services       Functional Status Assessment Patient has had a recent decline in their functional status and demonstrates the ability to make significant improvements in function in a reasonable and predictable amount of time.     Precautions /  Restrictions Precautions Precautions: Fall Restrictions Weight Bearing Restrictions: No      Mobility  Bed Mobility Overal bed mobility: Modified Independent                  Transfers Overall transfer level: Needs assistance Equipment used: None Transfers: Sit to/from Stand Sit to Stand: Supervision           General transfer comment: pt performed sit<>stand x1 from EOB and x1 from toilet    Ambulation/Gait Ambulation/Gait assistance: Supervision Gait Distance (Feet): 200 Feet Assistive device: None Gait Pattern/deviations: Step-through pattern, Decreased stride length Gait velocity: decreased     General Gait Details: pt with slow, cautious but steady gait without use of AD  Stairs            Wheelchair Mobility    Modified Rankin (Stroke Patients Only)       Balance Overall balance assessment: Needs assistance Sitting-balance support: Feet supported Sitting balance-Leahy Scale: Good     Standing balance support: During functional activity, No upper extremity supported Standing balance-Leahy Scale: Fair                               Pertinent Vitals/Pain Pain Assessment Pain Assessment: No/denies pain    Home Living Family/patient expects to be discharged to:: Private residence Living Arrangements: Spouse/significant other Available Help at Discharge: Family;Available 24 hours/day Type of Home: House Home Access: Ramped entrance     Alternate Level Stairs-Number of Steps: Has a stair lift Home Layout: Two level;Able to live on main level with bedroom/bathroom Home Equipment: Shower seat  Prior Function Prior Level of Function : Independent/Modified Independent;Driving             Mobility Comments: could ambulate in community without ad       Hand Dominance        Extremity/Trunk Assessment   Upper Extremity Assessment Upper Extremity Assessment: Overall WFL for tasks assessed    Lower Extremity  Assessment Lower Extremity Assessment: Overall WFL for tasks assessed    Cervical / Trunk Assessment Cervical / Trunk Assessment: Normal  Communication   Communication: No difficulties  Cognition Arousal/Alertness: Awake/alert Behavior During Therapy: WFL for tasks assessed/performed Overall Cognitive Status: Within Functional Limits for tasks assessed                                          General Comments      Exercises     Assessment/Plan    PT Assessment Patient needs continued PT services  PT Problem List Decreased balance;Decreased mobility;Decreased strength;Decreased activity tolerance       PT Treatment Interventions DME instruction;Gait training;Stair training;Functional mobility training;Therapeutic activities;Therapeutic exercise;Neuromuscular re-education;Balance training;Patient/family education    PT Goals (Current goals can be found in the Care Plan section)  Acute Rehab PT Goals Patient Stated Goal: increase her strength PT Goal Formulation: With patient Time For Goal Achievement: 01/28/23 Potential to Achieve Goals: Good    Frequency Min 3X/week     Co-evaluation               AM-PAC PT "6 Clicks" Mobility  Outcome Measure Help needed turning from your back to your side while in a flat bed without using bedrails?: None Help needed moving from lying on your back to sitting on the side of a flat bed without using bedrails?: None Help needed moving to and from a bed to a chair (including a wheelchair)?: A Little Help needed standing up from a chair using your arms (e.g., wheelchair or bedside chair)?: None Help needed to walk in hospital room?: A Little Help needed climbing 3-5 steps with a railing? : A Little 6 Click Score: 21    End of Session   Activity Tolerance: Patient tolerated treatment well Patient left: with nursing/sitter in room;Other (comment) (sitting on toilet in bathroom with RN present) Nurse  Communication: Mobility status PT Visit Diagnosis: Other abnormalities of gait and mobility (R26.89)    Time: 1610-9604 PT Time Calculation (min) (ACUTE ONLY): 16 min   Charges:   PT Evaluation $PT Eval Moderate Complexity: 1 Mod          Ginette Pitman, PT, DPT  Acute Rehabilitation Services Office [phone]   Alessandra Bevels Javonnie Illescas 01/14/2023, 11:36 AM

## 2023-01-14 NOTE — Discharge Summary (Signed)
Name: [name] MRN: [id number] DOB: 20-Jan-1951 72 y.o. PCP: Paulina Fusi, MD  Date of Admission: 01/13/2023 12:54 PM Date of Discharge:  01/14/2023 Attending Physician: Dr.  Sol Blazing  DISCHARGE DIAGNOSIS:  Primary Problem: Hepatic encephalopathy [facility])   Hospital Problems: Principal Problem:   Hepatic encephalopathy (HCC)    DISCHARGE MEDICATIONS:   Allergies as of 01/14/2023   No Known Allergies      Medication List     TAKE these medications    albuterol 108 (90 Base) MCG/ACT inhaler Commonly known as: VENTOLIN HFA Inhale 2 puffs into the lungs as needed for wheezing or shortness of breath.   Constulose 10 GM/15ML solution Generic drug: lactulose Take 45 mLs (30 g total) by mouth 4 (four) times daily. What changed:  how much to take when to take this   furosemide 40 MG tablet Commonly known as: LASIX Take 40 mg by mouth daily.   insulin aspart 100 UNIT/ML FlexPen Commonly known as: NOVOLOG Inject 30 Units into the skin 2 (two) times daily.   metFORMIN 500 MG 24 hr tablet Commonly known as: GLUCOPHAGE-XR Take 500 mg by mouth 2 (two) times daily with a meal.   ondansetron 4 MG tablet Commonly known as: ZOFRAN Take 1 tablet (4 mg total) by mouth every 4 (four) hours as needed for nausea. What changed:  when to take this reasons to take this   Ozempic (1 MG/DOSE) 4 MG/3ML Sopn Generic drug: Semaglutide (1 MG/DOSE) Inject 1 mg into the skin once a week.   rOPINIRole 2 MG tablet Commonly known as: REQUIP Take 2 mg by mouth at bedtime.   spironolactone 50 MG tablet Commonly known as: ALDACTONE Take 50 mg by mouth 2 (two) times daily.   Toujeo Max SoloStar 300 UNIT/ML Solostar Pen Generic drug: insulin glargine (2 Unit Dial) Inject 40 Units into the skin daily.   valACYclovir 1000 MG tablet Commonly known as: VALTREX Take 1,000-2,000 mg by mouth as needed (fever blisters).   Vitamin D (Ergocalciferol) 1.25 MG (50000 UNIT) Caps  capsule Commonly known as: DRISDOL Take 50,000 Units by mouth once a week.        DISPOSITION AND FOLLOW-UP:  [name] was discharged from [facility] in Stable condition. At the hospital follow up visit please address:  Hepatic encephalopathy Confirm how patient is dosing her lactulose daily and monitor for signs of recurrent encephalopathy. Titrate dose as needed for 2-3 BM daily.   Emesis Assess for recurrence of brown emesis and initiate further work up if indicated.  Follow-up Recommendations: Consults: None Labs: Basic Metabolic Profile and CBC Studies: None Medications: Lactulose 30 g (45 mL) four times a day   Follow-up Appointments:  Follow-up Information     Paulina Fusi, MD. Schedule an appointment as soon as possible for a visit in 1 week(s).   Specialty: Internal Medicine Contact information: [address] [place] [address] [phone]                 HOSPITAL COURSE:  Patient Summary: #Hepatic encephalopathy #Nonalcoholic cirrhosis  Patient presented to [facility] ED with confusion and lethargy, repetitive speech, and asterixis. Ammonia was 145. She was mildly hypertensive in the ED but otherwise HDS without evidence of acute infection. She was given a dose of lactulose 30 g with 3 BM. Abdominal US showed hepatic steatosis and hepatic cirrhosis without focal liver lesions. On exam on day of discharge she was awake and  fully oriented with resolution of asterixis. She explained that she was advised to change her lactulose dose after recent hospitalization at [facility] to 30 g TID. She was giving herself 2 tablespoons, three times daily which calculates to about 20 g TID. She was discharged from [facility] recently on 30 g 4 times daily, so it is felt that etiology of recurrent encephalopathy is that she was being under-treated as other causes of acute encephalopathy such as infection, thyroid dysfunction,  vitamin deficiencies, infection were ruled out. Home spironolactone and lasix were continued.   #Emesis Reported 2 episodes of 'brown' emesis prior to admission. No recurrence of this while hospitalized. Hgb stable during admission.    #HFpEF #Systolic murmur Previously documented systolic murmur. Prior echo showed normal EF and noted mild AS and mild MR. Does not appear volume overloaded. No acute interventions.   #T2DM on insulin Managed with SSI. Patient to resume home diabetes regimen at discharge.     DISCHARGE INSTRUCTIONS:   Discharge Instructions     Call MD for:  difficulty breathing, headache or visual disturbances   Complete by: As directed    Call MD for:  extreme fatigue   Complete by: As directed    Call MD for:  hives   Complete by: As directed    Call MD for:  persistant dizziness or light-headedness   Complete by: As directed    Call MD for:  persistant nausea and vomiting   Complete by: As directed    Call MD for:  redness, tenderness, or signs of infection (pain, swelling, redness, odor or green/yellow discharge around incision site)   Complete by: As directed    Call MD for:  severe uncontrolled pain   Complete by: As directed    Call MD for:  temperature >100.4   Complete by: As directed    Diet general   Complete by: As directed    Discharge instructions   Complete by: As directed    You were hospitalized for confusion and overall weakness. Likely due to your liver disease/cirrhosis. I am glad you are feeling a lot better this morning. We discussed resuming the Lactulose dose as prescribed which is 45 mL or 3 tablespoon for four (4) times a day. The goal is 3 loose bowel movements. Please make sure to call Dr. Tomasa Blase to schedule follow up visit next week. Please speak with Dr. Tomasa Blase about arranging outpatient physical therapy.  Thank you for allowing [place] to be part of your care.   --Schedule follow up visit with Dr. Tomasa Blase for next week --Lactulose 30  g (45 mL or 3 tablespoons) four (4) times a day (goal of 3 bowel movements a day)   Increase activity slowly   Complete by: As directed        SUBJECTIVE:  Evaluated at bedside on day of discharge. She says that she has been up since a little after 1 AM and is feeling great today. She explains that after recent stay at [facility], she cut back to 2 tablespoons of lactulose 3 times daily. Throughout the week she felt somewhat weak but went about her normal life, until Friday morning when she woke up very confused. She had at least 2 BM on Tuesday, but Wednesday and Thursday did not have more than 1.  She voices desire to get her regimen under control so that she does not have recurrence of encephalopathy. She says that she is ready to go home  Discharge Vitals:  BP (!) 153/65 (BP Location: Left Arm)   Pulse 76   Temp 98.4 F (36.9 C) (Oral)   Resp 16   Ht 5\' 3"  (1.6 m)   Wt 71.7 kg   SpO2 99%   BMI 27.99 kg/m   OBJECTIVE:  Physical Exam Constitutional:      General: She is not in acute distress.    Appearance: She is not ill-appearing.  HENT:     Head: Normocephalic and atraumatic.  Cardiovascular:     Rate and Rhythm: Normal rate and regular rhythm.     Comments: Systolic murmur heard. Pulmonary:     Effort: Pulmonary effort is normal.     Breath sounds: Normal breath sounds.  Abdominal:     General: Bowel sounds are normal.     Palpations: Abdomen is soft.     Tenderness: There is no abdominal tenderness. There is no guarding or rebound.  Skin:    General: Skin is warm and dry.  Neurological:     Mental Status: She is alert and oriented to person, place, and time. Mental status is at baseline.     Comments: No asterixis noted.   Psychiatric:        Mood and Affect: Mood normal.        Behavior: Behavior normal.      Pertinent Labs, Studies, and Procedures:     Latest Ref Rng & Units 01/14/2023    1:18 AM 01/13/2023    1:15 PM 01/01/2023   12:44 AM  CBC   WBC 4.0 - 10.5 K/uL 5.3  6.8  5.4   Hemoglobin 12.0 - 15.0 g/dL 98.1  19.1  47.8   Hematocrit 36.0 - 46.0 % 31.1  34.3  36.7   Platelets 150 - 400 K/uL 106  117  118        Latest Ref Rng & Units 01/14/2023    1:18 AM 01/13/2023    1:15 PM 01/03/2023    4:22 AM  CMP  Glucose 70 - 99 mg/dL 295  621  308   BUN 8 - 23 mg/dL 15  15  13    Creatinine 0.44 - 1.00 mg/dL 6.57  8.46  9.62   Sodium 135 - 145 mmol/L 141  140  136   Potassium 3.5 - 5.1 mmol/L 3.3  3.6  3.7   Chloride 98 - 111 mmol/L 110  107  104   CO2 22 - 32 mmol/L 23  22  21    Calcium 8.9 - 10.3 mg/dL 9.4  95.2  9.6   Total Protein 6.5 - 8.1 g/dL 5.4  6.5    Total Bilirubin 0.3 - 1.2 mg/dL 1.7  1.5    Alkaline Phos 38 - 126 U/L 46  59    AST 15 - 41 U/L 30  36    ALT 0 - 44 U/L 26  32      US Abdomen Limited  Result Date: 01/13/2023 CLINICAL DATA:  Cirrhosis. EXAM: ULTRASOUND ABDOMEN LIMITED RIGHT UPPER QUADRANT COMPARISON:  December 30, 2022 FINDINGS: Gallbladder: No gallstones or wall thickening visualized (2.1 mm). No sonographic Murphy sign noted by sonographer. Common bile duct: Diameter: 5.0 mm Liver: No focal lesion identified. The liver parenchyma is lobulated in echotexture and diffusely increased in echogenicity. Portal vein is patent on color Doppler imaging with normal direction of blood flow towards the liver. Other: None. IMPRESSION: 1. Hepatic steatosis and hepatic cirrhosis without focal liver lesions. Electronically Signed   By: Aram Candela  M.D.   On: 01/13/2023 19:57     Signed: Rana Snare, DO Internal Medicine Resident, PGY-1 [facility]  Pager: [phone]

## 2023-01-14 NOTE — Care Management (Signed)
Will fax OOP PT referral to agency of choice, patient also instructed to reach out to PCP Monday to have referral made to guarantee that they receive one.

## 2023-01-14 NOTE — Hospital Course (Addendum)
#  Hepatic encephalopathy #Nonalcoholic cirrhosis  Patient presented to [facility] ED with confusion and lethargy, repetitive speech, and asterixis. Ammonia was 145. She was mildly hypertensive in the ED but otherwise HDS without evidence of acute infection. She was given a dose of lactulose 30 g with 3 BM. Abdominal US showed hepatic steatosis and hepatic cirrhosis without focal liver lesions. On exam on day of discharge she was awake and fully oriented with resolution of asterixis. She explained that she was advised to change her lactulose dose after recent hospitalization at [facility] to 30 g TID. She was giving herself 2 tablespoons, three times daily which calculates to about 20 g TID. She was discharged from [facility] recently on 30 g 4 times daily, so it is felt that etiology of recurrent encephalopathy is that she was being under-treated as other causes of acute encephalopathy such as infection, thyroid dysfunction, vitamin deficiencies, infection were ruled out. Home spironolactone and lasix were continued.   #Emesis Reported 2 episodes of 'brown' emesis prior to admission. No recurrence of this while hospitalized. Hgb stable during admission.    #HFpEF #Systolic murmur Previously documented systolic murmur. Prior echo showed normal EF and noted mild AS and mild MR. Does not appear volume overloaded. No acute interventions.   #T2DM on insulin Managed with SSI. Patient to resume home diabetes regimen at discharge.

## 2023-01-14 NOTE — Progress Notes (Signed)
OT Cancellation Note  Patient Details Name: [name] MRN: [id number] DOB: [birth year]   Cancelled Treatment:    Reason Eval/Treat Not Completed: Patient at procedure or test/ unavailable (At HD will return as schedule allows.)  Tyler Deis, OTR/L [facility] Acute Rehabilitation Office: [phone]  Myrla Halsted 01/14/2023, 10:05 AM

## 2023-01-14 NOTE — Discharge Instructions (Addendum)
You were hospitalized for confusion and overall weakness. Likely due to your liver disease/cirrhosis. I am glad you are feeling a lot better this morning. We discussed resuming the Lactulose dose as prescribed which is 45 mL or 3 tablespoon for four (4) times a day. The goal is 3 loose bowel movements. Please make sure to call Dr. Tomasa Blase to schedule follow up visit next week. Please speak with Dr. Tomasa Blase about arranging outpatient physical therapy.  Thank you for allowing [place] to be part of your care.   --Schedule follow up visit with Dr. Tomasa Blase for next week --Lactulose 30 g (45 mL or 3 tablespoons) four (4) times a day (goal of 3 bowel movements a day)

## 2023-01-14 NOTE — Evaluation (Signed)
Occupational Therapy Evaluation Patient Details Name: [name] MRN: [id number] DOB: [birth year] Today's Date: 01/14/2023   History of Present Illness Pt is a 72 y/o female admitted secondary to weakness. Pt found to have hepatic encephalopathy secondary to nonalcoholic cirrhosis. PMH including but not limited to NASH cirrhosis, DM and CHF.   Clinical Impression   PTA, pt lived with her husband and was independent. Upon eval, pt mod I for BADL. Scoring a 0 on the short blessed test indicating nearly normal cognition. Pt also able to perform simple money management, as well as answer question about safety with medication management. Recommending discharge home with no OT follow up at this time. OT to sign off. Please re-consult if change in status.      Recommendations for follow up therapy are one component of a multi-disciplinary discharge planning process, led by the attending physician.  Recommendations may be updated based on patient status, additional functional criteria and insurance authorization.   Assistance Recommended at Discharge PRN  Patient can return home with the following Other (comment) (as needed)    Functional Status Assessment  Patient has had a recent decline in their functional status and demonstrates the ability to make significant improvements in function in a reasonable and predictable amount of time.  Equipment Recommendations  None recommended by OT    Recommendations for Other Services       Precautions / Restrictions Precautions Precautions: Fall Restrictions Weight Bearing Restrictions: No      Mobility Bed Mobility Overal bed mobility: Modified Independent                  Transfers Overall transfer level: Modified independent                        Balance Overall balance assessment: Mild deficits observed, not formally tested                                         ADL either performed or  assessed with clinical judgement   ADL Overall ADL's : Modified independent                                       General ADL Comments: able to perofrm all ADL mod I for incr time intermittently.     Vision Baseline Vision/History: 0 No visual deficits (reading glasses) Ability to See in Adequate Light: 0 Adequate Patient Visual Report: No change from baseline Vision Assessment?: No apparent visual deficits     Perception     Praxis      Pertinent Vitals/Pain Pain Assessment Pain Assessment: No/denies pain     Hand Dominance     Extremity/Trunk Assessment Upper Extremity Assessment Upper Extremity Assessment: Overall WFL for tasks assessed   Lower Extremity Assessment Lower Extremity Assessment: Overall WFL for tasks assessed   Cervical / Trunk Assessment Cervical / Trunk Assessment: Normal   Communication Communication Communication: No difficulties   Cognition Arousal/Alertness: Awake/alert Behavior During Therapy: WFL for tasks assessed/performed Overall Cognitive Status: Within Functional Limits for tasks assessed                                 General Comments: passsed Short blessed test  of cognition, able to perform money management, identify how she knows whether she has taken her medication for the day, very pleasant and conversational     General Comments       Exercises     Shoulder Instructions      Home Living Family/patient expects to be discharged to:: Private residence Living Arrangements: Spouse/significant other Available Help at Discharge: Family;Available 24 hours/day Type of Home: House Home Access: Ramped entrance     Home Layout: Two level;Able to live on main level with bedroom/bathroom Alternate Level Stairs-Number of Steps: Has a stair lift   Bathroom Shower/Tub: Producer, television/film/video: Handicapped height     Home Equipment: Shower seat          Prior Functioning/Environment  Prior Level of Function : Independent/Modified Independent;Driving             Mobility Comments: could ambulate in community without ad ADLs Comments: indep in ADL and IADL        OT Problem List: Decreased strength;Impaired balance (sitting and/or standing)      OT Treatment/Interventions:      OT Goals(Current goals can be found in the care plan section) Acute Rehab OT Goals Patient Stated Goal: have medications be correct and all respond well together OT Goal Formulation: With patient Time For Goal Achievement: 01/28/23 Potential to Achieve Goals: Good  OT Frequency:      Co-evaluation              AM-PAC OT "6 Clicks" Daily Activity     Outcome Measure Help from another person eating meals?: None Help from another person taking care of personal grooming?: None Help from another person toileting, which includes using toliet, bedpan, or urinal?: None Help from another person bathing (including washing, rinsing, drying)?: None Help from another person to put on and taking off regular upper body clothing?: None Help from another person to put on and taking off regular lower body clothing?: None 6 Click Score: 24   End of Session Equipment Utilized During Treatment: Gait belt Nurse Communication: Mobility status  Activity Tolerance: Patient tolerated treatment well Patient left: in chair;with call bell/phone within reach;with chair alarm set  OT Visit Diagnosis: Unsteadiness on feet (R26.81);Muscle weakness (generalized) (M62.81)                Time: 1610-9604 OT Time Calculation (min): 21 min Charges:  OT General Charges $OT Visit: 1 Visit OT Evaluation $OT Eval Low Complexity: 1 Low  Tyler Deis, OTR/L [facility] Acute Rehabilitation Office: [phone]   Myrla Halsted 01/14/2023, 2:14 PM

## 2023-01-16 ENCOUNTER — Telehealth: Payer: Self-pay

## 2023-01-16 NOTE — Telephone Encounter (Signed)
Pt states, "I would like Dr Gilman Buttner to look at my labs when she can. I have been in hospital 3 times in last 2 weeks. They have me taking 45mL lactulose four times a day. I'm not right. Dr Gilman Buttner can always get me straightened out".  Pt mentions she is more forgetful. I notified pt that you are out of clinic on Monday's and it would be tomorrow most likely before she gets return call. Pt verbalized understanding.

## 2023-01-19 NOTE — Progress Notes (Signed)
Patient Care Team: [name] Fusi, MD as PCP - General (Internal Medicine) [name] Ripple, DO as PCP - Cardiology (Cardiology) Destiny Beckwith, MD as Consulting Physician (Oncology) Destiny Floras, MD as Referring Physician (Gastroenterology)  Clinic Day: 01/20/23   Referring physician: Paulina Fusi, MD  ASSESSMENT & PLAN:  Assessment & Plan: 1. Iron deficiency anemia, felt to be secondary to chronic GI blood loss from AVM's, as well as intermittent epistaxis. She has required regular IV iron replacement and transfusions.  She saw Dr. Landis Curry at [facility] and he did cauterize her AVM's.  She finally stabilized and stopped bleeding. She has not required transfusions since the summer of 2022.  However her hemoglobin dropped to 9.9 in March of 2023 and she was found to be iron deficient.  She was given IV iron in April 2023, and has not required any since then.   2. Non-alcoholic liver cirrhosis, with intermittent mild thrombocytopenia.  Her anemia is also likely partly related to the cirrhosis.   3. Ascites, improved and stable, for which she is on spironolactone and furosemide.   4. Hepatic encephalopathy.  Her serum ammonia is normal. She has required 3 admission into the hospital with this diagnosis and her serum ammonia was 145. I have stressed to her that she needs to continue her current dose of Lactulose 45mg  three times daily in order to keep this under control.   5. Multiple AVM's of the small bowel seen on capsule endoscopy in [place], treated at [facility] in February 2021. I don't think she is bleeding any longer.  She saw Dr. Landis Curry in June and two small AVMs were treated with argon plasma coagulation.  Her hemoglobin has been stable and she has not required further transfusions.   6.  Thrombocytopenia, resolved.  Plan She has been admitted into the hospital 3 times in June. She was admitted into [facility] on June 7th for hepatic encephalopathy,  then on June 15th to [facility], and 21st for the same thing at [facility]. Her serum ammonia was 145 on 01/13/2023. Her serum ammonia today is down to 23 as of today. Her WBC is at 5.8, hemoglobin increased from 10.4 to 12.2 since discharge, and platelet count of 138,000 today. The day of discharge on 01/14/2023 her potassium was low at 3.3 and she was given oral doses in the hospital but it has increased back to normal at 3.8 since then. She was instructed to take Lactulose 45mg  4 times a day but decreased it to 3 times daily as she had an increase in diarrhea to the point where it was "stringy". I believe the current dose she is taking is a good dose and I urged her to continue taking it 3 times a day. We will closely monitor her serum ammonia. She is due for a follow-up for [name] Curry soon. I will cancel her appointment with me on July, 10th and I will see her back in 1 month with CBC, CMP, and serum ammonia. The patient understands the plans discussed today and is in agreement with them.  She knows to contact our office if she develops concerns prior to her next appointment.  I provided 22 minutes of face-to-face time during this this encounter and > 50% was spent counseling as documented under my assessment and plan.   [name] Curry [facility] [place] [place] AT [facility] [address] [place] [address] Dept: [phone] Dept Fax: [phone]  No orders of the defined types were placed in this encounter.     CHIEF COMPLAINT:  CC: A 72 year old female with history of iron deficiency anemia here for 6 week evaluation  Current Treatment:  Surveillance   HISTORY OF PRESENT ILLNESS:  [name] Curry is a 72 y.o. female  who we began seeing in December 2019 for iron deficiency anemia. In November 2018, colonoscopy revealed tubular adenomas.  Upper endoscopy in January 2018 did not reveal any specific findings.  She was clearly iron  deficient.  She has multiple comorbidities, including diabetes, liver cirrhosis, COPD, hyperlipidemia, and degenerative disc disease.  Testing for any other deficiency or monoclonal spike was negative. She had already been on oral supplement for nearly 1 year with lack of response, so was given IV iron in the form of Feraheme on several occasions. Repeat colonoscopy and EGD were done in August 2020 by Dr. Georgiana Curry.  Biopsy was negative.  When we saw her later in September, the hemoglobin had dropped down to 7.5.  She was transfused with 2 units of packed red blood cells.  Repeat iron studies once again revealed iron deficiency, so was given IV Feraheme.  CT abdomen and pelvis was negative.  She felt so much better after the transfusion that she jumped out of bed and fractured her right foot in a fall.  She was given IV Feraheme again in November.  Capsule endoscopy in November 2020 in [facility] revealed multiple AVM's of the small bowel.  She had cauterization of an AVM of the small bowel at [facility] in February 2021. In March 2021, her hemoglobin dropped back down 7.6.  She therefore received IV Feraheme again in early April.  Abdominal ultrasound in May revealed cirrhosis of the liver with nodular surface, increased echogenicity and heterogeneity.  No focal liver parenchymal lesion was observed.  She has had intermittent mild thrombocytopenia, presumably from her liver cirrhosis. She received IV Feraheme in June 2021, and received 2 units of packed red blood cells at that time. She was admitted to [facility] later in June with generalized weakness, altered mental status, and UTI.  While out of town, she developed severe epistaxis that required packing and 1 unit of packed red blood cells.  CT imaging revealed small amount of blood in the right maxillary and sphenoid sinuses with tube placed in the right paranasal sinus.     She developed a severe epistaxis requiring hospital admission in late July 2021 and had  a nasal bullet in place.  Her hemoglobin dropped down to 6.9, she was transfused, and given a dose of IV Feraheme while in the hospital.  She had an episode of hypotension and fever and was felt to be septic, so was transferred to the ICU.  She was placed on broad-spectrum IV antibiotics, and cultures remained negative.  Echocardiogram showed a suspicious mass in the right atrium, which may be attached to the interatrial septum.  The cardiologist reviewed the echocardiogram and felt that this was likely an artifact and not a mass, vegetation or thrombus. Her hemoglobin was 8.7 at the time of discharge, and she  was seen in August with weakness of her hands,shakiness, dropping objects, fatigue, dyspnea with exertion and moderate abdominal swelling.  She had asterixis, and ammonia level was elevated at 62.  She was therefore placed on lactulose 30 mL twice a day. She was seen a week later with some improvement, but her serum ammonia was still 54, so the lactulose  was increased to TID.  She also had hyperkalemia, so her spironolactone 25 mg was held.  She was transfused with 2 units of PRBCs again at the end of September when her hemoglobin dropped down to 7.3.  Iron level was 13.0 with a TIBC of 415 for a saturation of 3.1%. She was scheduled for 2 more doses of IV Feraheme.  She was admitted in October  with a fever of 105 and had E coli sepsis. Lactulose was placed on hold due to severe diarrhea, adding to her dehydration. Her serum ammonia came down to 9, so we did not resume lactulose.  She did have a new splenic infarction as of this admission, which was causing a lot of left abdominal pain.  Her spironolactone was increased to 50 mg to take 1 to 2 daily.  At her visit in November, she was placed back on lactulose daily.   When she was seen in early February 2022, her CBC reveals a hemoglobin of 7.6 with normal ANC and platelets.  She was therefore transfused 2 units of PRBCs. Iron studies revealed recurrent  iron deficiency.. Ammonia level was elevated at 35,  so her lactulose was increased to 30 cc twice daily.  She continued to have blood loss with black stools and one episode of moderate epistaxis,  She saw the gastroenterologist at [facility] on May 27th, Dr. Vonita Moss, and had an EGD with plasma coagulation to cauterize two AVM's.  He has now recommended observation and will see her back if her hemoglobin drops again, and we document iron deficiency.  She last received IV iron in July/August 2022. She was seen last month, November 2022, for worsening symptoms and was found to have increased ascites. I recommended increase of spironolactone to 50 mg BID. Her hemoglobin was stable and serum ammonia was normal.    INTERVAL HISTORY:  [name] Curry is here today for an added on appointment for hepatic encephalopathy and she was scheduled in 2 weeks for her iron deficiency anemia. Patient states that she doesn't feel the best and is severely fatigued. She has been admitted into the hospital 3 times in June. She was admitted into [facility] on June 7th for hepatic encephalopathy, then on June 15th to [facility], and 21st for the same thing. Her serum ammonia was 145 on 01/13/2023. Her serum ammonia today is down to 23 as of today. Her WBC is at 5.8, hemoglobin increased from 10.4 to 12.2 since discharge, and platelet count of 138,000 today. The day of discharge on 01/14/2023 her potassium was low at 3.3 and she was given oral doses in the hospital but it has increased back to normal at 3.8 since then. She was instructed to take Lactulose 45mg  4 times a day but decreased it to 3 times daily as she had an increase in diarrhea to the point where it was "stringy". I believe the current dose she is taking is a good dose and I urged her to continue taking it 3 times a day. We will closely monitor her serum ammonia. She is due for a follow-up for [name] Curry soon. I will cancel her appointment with me on July, 10th and I will see her  back in 1 month with CBC, CMP, and serum ammonia. She denies signs of infection such as sore throat, sinus drainage, cough, or urinary symptoms.  She denies fevers or recurrent chills. She denies pain. She denies vomiting, chest pain, dyspnea or cough. Her appetite is poor and her weight has  decreased 5 pounds over last 17 days .  I have reviewed the past medical history, past surgical history, social history and family history with the patient and they are unchanged from previous note.  ALLERGIES:  has No Known Allergies.  MEDICATIONS:  Current Outpatient Medications  Medication Sig Dispense Refill   albuterol (VENTOLIN HFA) 108 (90 Base) MCG/ACT inhaler Inhale 2 puffs into the lungs as needed for wheezing or shortness of breath.     furosemide (LASIX) 40 MG tablet Take 40 mg by mouth daily.     insulin aspart (NOVOLOG) 100 UNIT/ML FlexPen Inject 30 Units into the skin 2 (two) times daily.     lactulose (CHRONULAC) 10 GM/15ML solution Take 45 mLs (30 g total) by mouth 4 (four) times daily. (Patient taking differently: Take 30 mLs by mouth 3 (three) times daily.) 5400 mL 0   metFORMIN (GLUCOPHAGE-XR) 500 MG 24 hr tablet Take 500 mg by mouth 2 (two) times daily with a meal.  5   ondansetron (ZOFRAN) 4 MG tablet Take 1 tablet (4 mg total) by mouth every 4 (four) hours as needed for nausea. (Patient taking differently: Take 4 mg by mouth as needed for nausea or vomiting.) 90 tablet 3   OZEMPIC, 1 MG/DOSE, 4 MG/3ML SOPN Inject 1 mg into the skin once a week.     rOPINIRole (REQUIP) 2 MG tablet Take 2 mg by mouth at bedtime.  3   spironolactone (ALDACTONE) 50 MG tablet Take 50 mg by mouth 2 (two) times daily.     TOUJEO MAX SOLOSTAR 300 UNIT/ML Solostar Pen Inject 40 Units into the skin daily.     valACYclovir (VALTREX) 1000 MG tablet Take 1,000-2,000 mg by mouth as needed (fever blisters).     Vitamin D, Ergocalciferol, (DRISDOL) 1.25 MG (50000 UT) CAPS capsule Take 50,000 Units by mouth once a week.      No current facility-administered medications for this visit.    HISTORY OF PRESENT ILLNESS:   Oncology History  Iron deficiency anemia    REVIEW OF SYSTEMS:  Review of Systems  Constitutional:  Positive for appetite change and fatigue. Negative for chills, diaphoresis, fever and unexpected weight change.  HENT:  Negative.  Negative for hearing loss, lump/mass, mouth sores, nosebleeds, sore throat, tinnitus, trouble swallowing and voice change.   Eyes: Negative.  Negative for eye problems and icterus.  Respiratory: Negative.  Negative for chest tightness, cough, hemoptysis, shortness of breath and wheezing.   Cardiovascular: Negative.  Negative for chest pain, leg swelling and palpitations.  Gastrointestinal:  Positive for nausea. Negative for abdominal distention, abdominal pain, blood in stool, constipation, diarrhea, rectal pain and vomiting.  Endocrine: Negative.   Genitourinary: Negative.  Negative for bladder incontinence, difficulty urinating, dyspareunia, dysuria, frequency, hematuria, menstrual problem, nocturia, pelvic pain, vaginal bleeding and vaginal discharge.   Musculoskeletal:  Negative for arthralgias, back pain, flank pain, gait problem, myalgias, neck pain and neck stiffness.  Skin: Negative.  Negative for itching, rash and wound.  Neurological:  Negative for dizziness, extremity weakness, gait problem, headaches, light-headedness, numbness, seizures and speech difficulty.  Hematological: Negative.  Negative for adenopathy. Does not bruise/bleed easily.  Psychiatric/Behavioral:  Positive for confusion. Negative for decreased concentration, depression, sleep disturbance and suicidal ideas. The patient is not nervous/anxious.    VITALS:  There were no vitals taken for this visit.  Wt Readings from Last 3 Encounters:  01/13/23 158 lb (71.7 kg)  01/03/23 169 lb 15.6 oz (77.1 kg)  10/04/22 161 lb 12.8  oz (73.4 kg)    There is no height or weight on file to calculate  BMI.  Performance status (ECOG): 1 - Symptomatic but completely ambulatory  PHYSICAL EXAM:  Physical Exam Vitals and nursing note reviewed.  Constitutional:      General: She is not in acute distress.    Appearance: Normal appearance. She is normal weight. She is not ill-appearing, toxic-appearing or diaphoretic.  HENT:     Head: Normocephalic and atraumatic.     Right Ear: Tympanic membrane, ear canal and external ear normal. There is no impacted cerumen.     Left Ear: Tympanic membrane, ear canal and external ear normal. There is no impacted cerumen.     Nose: Nose normal. No congestion or rhinorrhea.     Mouth/Throat:     Mouth: Mucous membranes are moist.     Pharynx: Oropharynx is clear. No oropharyngeal exudate or posterior oropharyngeal erythema.  Eyes:     General: No scleral icterus.       Right eye: No discharge.        Left eye: No discharge.     Extraocular Movements: Extraocular movements intact.     Conjunctiva/sclera: Conjunctivae normal.     Pupils: Pupils are equal, round, and reactive to light.  Neck:     Vascular: No carotid bruit.  Cardiovascular:     Rate and Rhythm: Normal rate and regular rhythm.     Pulses: Normal pulses.     Heart sounds: Normal heart sounds. No murmur heard.    No friction rub. No gallop.  Pulmonary:     Effort: Pulmonary effort is normal. No respiratory distress.     Breath sounds: Normal breath sounds. No stridor. No wheezing, rhonchi or rales.  Chest:     Chest wall: No tenderness.  Abdominal:     General: Bowel sounds are normal. There is distension (slight and mild ascites).     Palpations: Abdomen is soft. There is no hepatomegaly, splenomegaly or mass.     Tenderness: There is no abdominal tenderness. There is no right CVA tenderness, left CVA tenderness, guarding or rebound.     Hernia: No hernia is present.     Comments: Moderate ascites.  I cannot feel the liver or the spleen.   Musculoskeletal:        General: No  swelling, tenderness, deformity or signs of injury. Normal range of motion.     Cervical back: Normal range of motion and neck supple. No rigidity or tenderness.     Right lower leg: No edema.     Left lower leg: No edema.  Lymphadenopathy:     Cervical: No cervical adenopathy.     Right cervical: No superficial, deep or posterior cervical adenopathy.    Left cervical: No superficial, deep or posterior cervical adenopathy.     Upper Body:     Right upper body: No supraclavicular, axillary or pectoral adenopathy.     Left upper body: No supraclavicular, axillary or pectoral adenopathy.  Skin:    General: Skin is warm and dry.     Coloration: Skin is not jaundiced or pale.     Findings: No bruising, erythema, lesion or rash.  Neurological:     General: No focal deficit present.     Mental Status: She is alert and oriented to person, place, and time. Mental status is at baseline.     Cranial Nerves: No cranial nerve deficit.     Sensory: No sensory deficit.  Motor: No weakness.     Coordination: Coordination normal.     Gait: Gait normal.     Deep Tendon Reflexes: Reflexes normal.  Psychiatric:        Mood and Affect: Mood normal.        Behavior: Behavior normal.        Thought Content: Thought content normal.        Judgment: Judgment normal.    LABORATORY DATA:  I have reviewed the data as listed    Component Value Date/Time   NA 141 01/14/2023 0118   NA 140 08/03/2022 0000   K 3.3 (L) 01/14/2023 0118   CL 110 01/14/2023 0118   CO2 23 01/14/2023 0118   GLUCOSE 187 (H) 01/14/2023 0118   BUN 15 01/14/2023 0118   BUN 14 08/03/2022 0000   CREATININE 1.25 (H) 01/14/2023 0118   CALCIUM 9.4 01/14/2023 0118   PROT 5.4 (L) 01/14/2023 0118   ALBUMIN 2.9 (L) 01/14/2023 0118   AST 30 01/14/2023 0118   ALT 26 01/14/2023 0118   ALKPHOS 46 01/14/2023 0118   BILITOT 1.7 (H) 01/14/2023 0118   GFRNONAA 46 (L) 01/14/2023 0118   GFRAA NOT CALCULATED 04/18/2011 0607    No results  found for: "SPEP", "UPEP"  Lab Results  Component Value Date   WBC 5.3 01/14/2023   NEUTROABS 4.7 12/30/2022   HGB 10.4 (L) 01/14/2023   HCT 31.1 (L) 01/14/2023   MCV 94.8 01/14/2023   PLT 106 (L) 01/14/2023   Component Ref Range & Units 01/14/23  Vitamin B-12 180 - 914 pg/mL 583   Component Ref Range & Units 01/14/23 2 wk ago  Magnesium 1.7 - 2.4 mg/dL 2.1 2.5 High  CM   Component Ref Range & Units 01/14/23 5 mo ago 1 yr ago  TSH 0.350 - 4.500 uIU/mL 1.542 2.517 CM 3.11 R            Component Ref Range & Units 6 d ago (01/13/23) 2 wk ago (12/30/22) 5 mo ago (08/03/22) 10 mo ago (03/04/22) 1 yr ago (01/03/22) 1 yr ago (06/02/21) 1 yr ago (03/23/21)  Ammonia 9 - 35 umol/L 145 High  77 High  CM <9.0 R 15.0 R <9.0 R <9.0 R <9.0 R     Component Ref Range & Units 5 d ago (01/14/23) 5 d ago (01/14/23) 6 d ago (01/13/23) 6 d ago (01/13/23) 2 wk ago (01/03/23) 2 wk ago (01/03/23) 2 wk ago (01/02/23)  Glucose-Capillary 70 - 99 mg/dL 161 High  096 High  CM 243 High  CM 145 High  CM 192 High  CM 178 High  CM 227 High  CM      Chemistry      Component Value Date/Time   NA 141 01/14/2023 0118   NA 140 08/03/2022 0000   K 3.3 (L) 01/14/2023 0118   CL 110 01/14/2023 0118   CO2 23 01/14/2023 0118   BUN 15 01/14/2023 0118   BUN 14 08/03/2022 0000   CREATININE 1.25 (H) 01/14/2023 0118   GLU 287 08/03/2022 0000      Component Value Date/Time   CALCIUM 9.4 01/14/2023 0118   ALKPHOS 46 01/14/2023 0118   AST 30 01/14/2023 0118   ALT 26 01/14/2023 0118   BILITOT 1.7 (H) 01/14/2023 0118       RADIOGRAPHIC STUDIES: US Abdomen Limited  Result Date: 01/13/2023 CLINICAL DATA:  Cirrhosis. EXAM: ULTRASOUND ABDOMEN LIMITED RIGHT UPPER QUADRANT COMPARISON:  December 30, 2022 FINDINGS: Gallbladder: No  gallstones or wall thickening visualized (2.1 mm). No sonographic Murphy sign noted by sonographer. Common bile duct: Diameter: 5.0 mm Liver: No focal lesion identified. The liver parenchyma is  lobulated in echotexture and diffusely increased in echogenicity. Portal vein is patent on color Doppler imaging with normal direction of blood flow towards the liver. Other: None. IMPRESSION: 1. Hepatic steatosis and hepatic cirrhosis without focal liver lesions. Electronically Signed   By: Aram Candela M.D.   On: 01/13/2023 19:57   DG Abd 1 View  Result Date: 01/02/2023 CLINICAL DATA:  Abdominal pain, distention EXAM: ABDOMEN - 1 VIEW COMPARISON:  CT abdomen/pelvis 03/11/2022 FINDINGS: There is a nonobstructive bowel gas pattern. There is no gross organomegaly or abnormal soft tissue calcification. There is no definite free intraperitoneal air, within the confines of supine technique. The imaged lung bases are clear. There is no acute osseous abnormality. IMPRESSION: No acute finding in the abdomen. Electronically Signed   By: Lesia Hausen M.D.   On: 01/02/2023 16:26   US Abdomen Limited  Result Date: 12/30/2022 CLINICAL DATA:  Cirrhosis.  Ascites. EXAM: ULTRASOUND ABDOMEN LIMITED RIGHT UPPER QUADRANT COMPARISON:  CT 03/06/2022 FINDINGS: Gallbladder: Physiologically distended. No gallstones or wall thickening visualized. No sonographic Murphy sign noted by sonographer. Common bile duct: Diameter: 2 mm, normal. Liver: Heterogeneous parenchyma with nodular contours. No discrete hepatic lesion. Borderline increased parenchymal echogenicity. Portal vein is patent on color Doppler imaging with normal direction of blood flow towards the liver. Other: No significant abdominal ascites. IMPRESSION: 1. Hepatic cirrhosis.  No evidence of focal lesion. 2. No significant ascites. Electronically Signed   By: Narda Rutherford M.D.   On: 12/30/2022 22:40   CT Head Wo Contrast  Result Date: 12/30/2022 CLINICAL DATA:  Mental status change, unknown cause EXAM: CT HEAD WITHOUT CONTRAST TECHNIQUE: Contiguous axial images were obtained from the base of the skull through the vertex without intravenous contrast. RADIATION  DOSE REDUCTION: This exam was performed according to the departmental dose-optimization program which includes automated exposure control, adjustment of the mA and/or kV according to patient size and/or use of iterative reconstruction technique. COMPARISON:  CT head 02/14/2020. FINDINGS: Brain: No evidence of acute infarction, hemorrhage, hydrocephalus, extra-axial collection or mass lesion/mass effect. Vascular: No hyperdense vessel or unexpected calcification. Skull: No acute fracture. Sinuses/Orbits: Clear sinuses.  No acute orbital findings. Other: No mastoid effusions. IMPRESSION: No evidence of acute intracranial abnormality. Electronically Signed   By: Feliberto Harts M.D.   On: 12/30/2022 19:23     I,Jasmine M Lassiter,acting as a scribe for Destiny Beckwith, MD.,have documented all relevant documentation on the behalf of Destiny Beckwith, MD,as directed by  Destiny Beckwith, MD while in the presence of Destiny Beckwith, MD.

## 2023-01-20 ENCOUNTER — Other Ambulatory Visit: Payer: Self-pay | Admitting: Oncology

## 2023-01-20 ENCOUNTER — Encounter: Payer: Self-pay | Admitting: Oncology

## 2023-01-20 ENCOUNTER — Inpatient Hospital Stay: Payer: Medicare Other | Attending: Oncology | Admitting: Oncology

## 2023-01-20 ENCOUNTER — Inpatient Hospital Stay: Payer: Medicare Other

## 2023-01-20 ENCOUNTER — Telehealth: Payer: Self-pay | Admitting: Oncology

## 2023-01-20 VITALS — BP 147/58 | HR 93 | Temp 98.0°F | Resp 18 | Ht 63.0 in | Wt 164.3 lb

## 2023-01-20 DIAGNOSIS — D500 Iron deficiency anemia secondary to blood loss (chronic): Secondary | ICD-10-CM

## 2023-01-20 DIAGNOSIS — K7682 Hepatic encephalopathy: Secondary | ICD-10-CM

## 2023-01-20 DIAGNOSIS — D649 Anemia, unspecified: Secondary | ICD-10-CM | POA: Diagnosis not present

## 2023-01-20 DIAGNOSIS — K7460 Unspecified cirrhosis of liver: Secondary | ICD-10-CM | POA: Diagnosis not present

## 2023-01-20 LAB — BASIC METABOLIC PANEL
BUN: 12 (ref 4–21)
CO2: 22 (ref 13–22)
Chloride: 104 (ref 99–108)
Creatinine: 1 (ref 0.5–1.1)
EGFR: 54
Glucose: 172
Potassium: 3.8 mEq/L (ref 3.5–5.1)
Sodium: 139 (ref 137–147)

## 2023-01-20 LAB — HEPATIC FUNCTION PANEL
ALT: 34 U/L (ref 7–35)
AST: 54 — AB (ref 13–35)
Alkaline Phosphatase: 58 (ref 25–125)
Bilirubin, Total: 1.7

## 2023-01-20 LAB — CBC AND DIFFERENTIAL
HCT: 36 (ref 36–46)
Hemoglobin: 12.2 (ref 12.0–16.0)
Neutrophils Absolute: 3.77
Platelets: 138 10*3/uL — AB (ref 150–400)
WBC: 5.8

## 2023-01-20 LAB — AMMONIA: Ammonia: 23

## 2023-01-20 LAB — COMPREHENSIVE METABOLIC PANEL
Albumin: 3.9 (ref 3.5–5.0)
Calcium: 9.5 (ref 8.7–10.7)

## 2023-01-20 LAB — CBC: RBC: 3.73 — AB (ref 3.87–5.11)

## 2023-01-20 NOTE — Telephone Encounter (Signed)
01/20/23 Spoke with patient and confirmed next appt on 02/22/23 arrive at 130am

## 2023-01-27 DIAGNOSIS — K7682 Hepatic encephalopathy: Secondary | ICD-10-CM | POA: Diagnosis not present

## 2023-01-31 NOTE — Progress Notes (Signed)
[name] was prescribed Xifaxan through Dr. Hoy Finlay office, medication is very expensive. Called and spoke with her about free medication program through [facility], filled out patient portion for her. She will stop by today to pick up paperwork to take to Dr. Tomasa Blase for him to complete his portion and fax in.

## 2023-02-01 ENCOUNTER — Ambulatory Visit: Payer: Medicare Other | Admitting: Oncology

## 2023-02-01 ENCOUNTER — Other Ambulatory Visit: Payer: Medicare Other

## 2023-02-03 ENCOUNTER — Encounter: Payer: Self-pay | Admitting: Oncology

## 2023-02-14 ENCOUNTER — Encounter: Payer: Self-pay | Admitting: Oncology

## 2023-02-17 ENCOUNTER — Other Ambulatory Visit: Payer: Self-pay | Admitting: Hematology and Oncology

## 2023-02-17 ENCOUNTER — Encounter: Payer: Self-pay | Admitting: Oncology

## 2023-02-17 ENCOUNTER — Inpatient Hospital Stay (INDEPENDENT_AMBULATORY_CARE_PROVIDER_SITE_OTHER): Payer: Medicare Other | Admitting: Oncology

## 2023-02-17 ENCOUNTER — Other Ambulatory Visit: Payer: Self-pay | Admitting: Oncology

## 2023-02-17 ENCOUNTER — Inpatient Hospital Stay: Payer: Medicare Other | Attending: Oncology

## 2023-02-17 VITALS — BP 144/61 | HR 83 | Temp 97.8°F | Resp 18 | Ht 63.0 in | Wt 165.1 lb

## 2023-02-17 DIAGNOSIS — E876 Hypokalemia: Secondary | ICD-10-CM

## 2023-02-17 DIAGNOSIS — K7682 Hepatic encephalopathy: Secondary | ICD-10-CM | POA: Diagnosis not present

## 2023-02-17 DIAGNOSIS — D500 Iron deficiency anemia secondary to blood loss (chronic): Secondary | ICD-10-CM

## 2023-02-17 DIAGNOSIS — K7460 Unspecified cirrhosis of liver: Secondary | ICD-10-CM

## 2023-02-17 DIAGNOSIS — R300 Dysuria: Secondary | ICD-10-CM

## 2023-02-17 DIAGNOSIS — D649 Anemia, unspecified: Secondary | ICD-10-CM | POA: Diagnosis not present

## 2023-02-17 LAB — BASIC METABOLIC PANEL
BUN: 16 (ref 4–21)
CO2: 26 — AB (ref 13–22)
Chloride: 104 (ref 99–108)
Creatinine: 1.2 — AB (ref 0.5–1.1)
Glucose: 125
Potassium: 3.2 mEq/L — AB (ref 3.5–5.1)
Sodium: 142 (ref 137–147)

## 2023-02-17 LAB — AMMONIA: Ammonia: 15

## 2023-02-17 LAB — CBC AND DIFFERENTIAL
HCT: 34 — AB (ref 36–46)
Hemoglobin: 11.6 — AB (ref 12.0–16.0)
Neutrophils Absolute: 2.99
Platelets: 123 10*3/uL — AB (ref 150–400)
WBC: 4.9

## 2023-02-17 LAB — HEPATIC FUNCTION PANEL
ALT: 33 U/L (ref 7–35)
AST: 48 — AB (ref 13–35)
Alkaline Phosphatase: 53 (ref 25–125)
Bilirubin, Total: 1.2

## 2023-02-17 LAB — COMPREHENSIVE METABOLIC PANEL
Albumin: 4 (ref 3.5–5.0)
Calcium: 10.6 (ref 8.7–10.7)

## 2023-02-17 LAB — CBC: RBC: 3.6 — AB (ref 3.87–5.11)

## 2023-02-17 MED ORDER — POTASSIUM CHLORIDE CRYS ER 20 MEQ PO TBCR
20.0000 meq | EXTENDED_RELEASE_TABLET | Freq: Two times a day (BID) | ORAL | 5 refills | Status: DC
Start: 2023-02-17 — End: 2023-02-17

## 2023-02-17 MED ORDER — POTASSIUM CHLORIDE CRYS ER 20 MEQ PO TBCR
20.0000 meq | EXTENDED_RELEASE_TABLET | Freq: Two times a day (BID) | ORAL | 5 refills | Status: AC
Start: 2023-02-17 — End: ?

## 2023-02-17 NOTE — Progress Notes (Signed)
Patient Care Team: Paulina Fusi, MD as PCP - General (Internal Medicine) Thomasene Ripple, DO as PCP - Cardiology (Cardiology) [name], MD as Consulting Physician (Oncology) Daine Floras, MD as Referring Physician (Gastroenterology)  Clinic Day: 02/17/2023  Referring physician: Paulina Fusi, MD  ASSESSMENT & PLAN:  Assessment & Plan: 1. Iron deficiency anemia, felt to be secondary to chronic GI blood loss from AVM's, as well as intermittent epistaxis. She has required regular IV iron replacement and transfusions.  She saw Dr. Landis Gandy at [facility] and he did cauterize her AVM's.  She finally stabilized and stopped bleeding. She has not required transfusions since the summer of 2022.  However her hemoglobin dropped to 9.9 in March of 2023 and she was found to be iron deficient.  She was given IV iron in April 2023, and has not required any since then.   2. Non-alcoholic liver cirrhosis, with intermittent mild thrombocytopenia.  Her anemia is also likely partly related to the cirrhosis.   3. Ascites, improved and stable, for which she is on spironolactone and furosemide.   4. Hepatic encephalopathy.  Her serum ammonia is normal. She has required 3 admissions into the hospital this summer with this diagnosis and her serum ammonia was 145. I have stressed to her that she needs to continue her current dose of Lactulose 45mg  three times daily in order to keep this under control.   5. Multiple AVM's of the small bowel seen on capsule endoscopy in [place], treated at [facility] in February 2021. I don't think she is bleeding any longer.  She saw Dr. Landis Gandy in June and two small AVMs were treated with argon plasma coagulation.  Her hemoglobin has been stable and she has not required further transfusions.   6.  Thrombocytopenia, mild.  7. Anemia, mild.  8. Hypokalemia. Her potassium is down to 3.2 despite taking Spironolactone 50mg  BID so I will prescribe  oral supplement cautiously.   Plan: She has been admitted into the hospital 3 times in June for hepatic encephalopathy. She is stabilizing now. She takes Xifaxan 550mg  twice a day and her stomach bloats as a side effect. I will order a urinalysis and urine culture due to urinary symptoms. She informed me that her sister is currently in the hospital and was now placed on Hospice. She has a WBC of 4.9, a low hemoglobin of 11.6, and a platelet count of 123,000 as of today, these are mildly lower than last time. Her CMP is normal other than a elevated calcium of 10.6, and SGOT of 48 with a creatinine of 1.2, and a low potassium of 3.2. Her ammonia level is normal at 15.0.  I prescribe a oral potassium supplement BID for one week and then switch to once daily. She will see Dr. Tomasa Blase in 3 months. I will see her back in 1.5 months with CBC, CMP, and serum ammonia. The patient understands the plans discussed today and is in agreement with them.  She knows to contact our office if she develops concerns prior to her next appointment.   I provided 22 minutes of face-to-face time during this this encounter and > 50% was spent counseling as documented under my assessment and plan.   [name], MD  [facility] AT [facility] [address] [place] [address] Dept: [phone] Dept Fax: [phone]   Orders Placed This Encounter  Procedures   Culture, Urine  Sent to [facility] on 02/17/23    Standing Status:   Future    Standing Expiration Date:   02/17/2024    CHIEF COMPLAINT:  CC: history of iron deficiency anemia, liver cirrhosis, hepatic encephalopathy  Current Treatment:  Surveillance   HISTORY OF PRESENT ILLNESS:  [name] is a 72 y.o. female  who we began seeing in December 2019 for iron deficiency anemia. In November 2018, colonoscopy revealed tubular adenomas.  Upper endoscopy in January 2018 did not reveal any  specific findings.  She was clearly iron deficient.  She has multiple comorbidities, including diabetes, liver cirrhosis, COPD, hyperlipidemia, and degenerative disc disease.  Testing for any other deficiency or monoclonal spike was negative. She had already been on oral supplement for nearly 1 year with lack of response, so was given IV iron in the form of Feraheme on several occasions. Repeat colonoscopy and EGD were done in August 2020 by Dr. Georgiana Shore.  Biopsy was negative.  When we saw her later in September, the hemoglobin had dropped down to 7.5.  She was transfused with 2 units of packed red blood cells.  Repeat iron studies once again revealed iron deficiency, so was given IV Feraheme.  CT abdomen and pelvis was negative.  She felt so much better after the transfusion that she jumped out of bed and fractured her right foot in a fall.  She was given IV Feraheme again in November.  Capsule endoscopy in November 2020 in [facility] revealed multiple AVM's of the small bowel.  She had cauterization of an AVM of the small bowel at [facility] in February 2021. In March 2021, her hemoglobin dropped back down 7.6.  She therefore received IV Feraheme again in early April.  Abdominal ultrasound in May revealed cirrhosis of the liver with nodular surface, increased echogenicity and heterogeneity.  No focal liver parenchymal lesion was observed.  She has had intermittent mild thrombocytopenia, presumably from her liver cirrhosis. She received IV Feraheme in June 2021, and received 2 units of packed red blood cells at that time. She was admitted to [facility] later in June with generalized weakness, altered mental status, and UTI.  While out of town, she developed severe epistaxis that required packing and 1 unit of packed red blood cells.  CT imaging revealed small amount of blood in the right maxillary and sphenoid sinuses with tube placed in the right paranasal sinus.     She developed a severe epistaxis requiring  hospital admission in late July 2021 and had a nasal bullet in place.  Her hemoglobin dropped down to 6.9, she was transfused, and given a dose of IV Feraheme while in the hospital.  She had an episode of hypotension and fever and was felt to be septic, so was transferred to the ICU.  She was placed on broad-spectrum IV antibiotics, and cultures remained negative.  Echocardiogram showed a suspicious mass in the right atrium, which may be attached to the interatrial septum.  The cardiologist reviewed the echocardiogram and felt that this was likely an artifact and not a mass, vegetation or thrombus. Her hemoglobin was 8.7 at the time of discharge, and she  was seen in August with weakness of her hands,shakiness, dropping objects, fatigue, dyspnea with exertion and moderate abdominal swelling.  She had asterixis, and ammonia level was elevated at 62.  She was therefore placed on lactulose 30 mL twice a day. She was seen a week later with some improvement, but her serum ammonia was still  54, so the lactulose was increased to TID.  She also had hyperkalemia, so her spironolactone 25 mg was held.  She was transfused with 2 units of PRBCs again at the end of September when her hemoglobin dropped down to 7.3.  Iron level was 13.0 with a TIBC of 415 for a saturation of 3.1%. She was scheduled for 2 more doses of IV Feraheme.  She was admitted in October  with a fever of 105 and had E coli sepsis. Lactulose was placed on hold due to severe diarrhea, adding to her dehydration. Her serum ammonia came down to 9, so we did not resume lactulose.  She did have a new splenic infarction as of this admission, which was causing a lot of left abdominal pain.  Her spironolactone was increased to 50 mg to take 1 to 2 daily.  At her visit in November, she was placed back on lactulose daily.   When she was seen in early February 2022, her CBC reveals a hemoglobin of 7.6 with normal ANC and platelets.  She was therefore transfused 2 units  of PRBCs. Iron studies revealed recurrent iron deficiency.. Ammonia level was elevated at 35,  so her lactulose was increased to 30 cc twice daily.  She continued to have blood loss with black stools and one episode of moderate epistaxis,  She saw the gastroenterologist at [facility] on May 27th, Dr. Vonita Moss, and had an EGD with plasma coagulation to cauterize two AVM's.  He has now recommended observation and will see her back if her hemoglobin drops again, and we document iron deficiency.  She last received IV iron in July/August 2022. She was seen last month, November 2022, for worsening symptoms and was found to have increased ascites. I recommended increase of spironolactone to 50 mg BID. Her hemoglobin was stable and serum ammonia was normal.    INTERVAL HISTORY:  [name] is here today for repeat clinical assessment for history of iron deficiency anemia. Patient states that she feels fatigued but better then usual and complains of diarrhea from lactulose on the 3rd and 4th day. She continues to take lactulose 4tbsp three times a day. She takes Xifaxan 550mg  twice a day and her stomach bloats as a side effect. I will order a urinalysis and urine culture due to urinary symptoms. She informed me that her sister is currently in the hospital and was now placed on Hospice. She has a WBC of 4.9, a low hemoglobin of 11.6, and a platelet count of 123,000 as of today these are mildly lower than last time. Her CMP is normal other than a elevated calcium of 10.6, and SGOT of 48 with a creatinine of 1.2, and a low potassium of 3.2. Her ammonia level is normal at 15.0.  I  will prescribe an oral potassium supplement BID for one week and then switch to once daily. She will see Dr. Tomasa Blase in 3 months. I will see her back in 1.5 months with CBC, CMP, and serum ammonia. She denies signs of infection such as sore throat, sinus drainage, cough, or urinary symptoms.  She denies fevers or recurrent chills. She denies pain.  She denies nausea, vomiting, chest pain, dyspnea or cough. Her appetite comes and goes and her weight has increased 7 pounds over last month .   I have reviewed the past medical history, past surgical history, social history and family history with the patient and they are unchanged from previous note.  ALLERGIES:  has No Known  Allergies.  MEDICATIONS:  Current Outpatient Medications  Medication Sig Dispense Refill   XIFAXAN 550 MG TABS tablet Take 550 mg by mouth 2 (two) times daily.     albuterol (VENTOLIN HFA) 108 (90 Base) MCG/ACT inhaler Inhale 2 puffs into the lungs as needed for wheezing or shortness of breath.     furosemide (LASIX) 40 MG tablet Take 40 mg by mouth daily.     insulin aspart (NOVOLOG) 100 UNIT/ML FlexPen Inject 30 Units into the skin 2 (two) times daily.     metFORMIN (GLUCOPHAGE-XR) 500 MG 24 hr tablet Take 500 mg by mouth 2 (two) times daily with a meal.  5   ondansetron (ZOFRAN) 4 MG tablet Take 1 tablet (4 mg total) by mouth every 4 (four) hours as needed for nausea. 60 tablet 3   OZEMPIC, 1 MG/DOSE, 4 MG/3ML SOPN Inject 1 mg into the skin once a week.     potassium chloride SA (KLOR-CON M) 20 MEQ tablet Take 1 tablet (20 mEq total) by mouth 2 (two) times daily. After 1 week, change to 1 pill by mouth once daily 40 tablet 5   pregabalin (LYRICA) 75 MG capsule Take 1 capsule (75 mg total) by mouth daily. At bedtime 30 capsule 5   rOPINIRole (REQUIP) 2 MG tablet Take 2 mg by mouth at bedtime.  3   spironolactone (ALDACTONE) 50 MG tablet Take 50 mg by mouth 2 (two) times daily.     TOUJEO MAX SOLOSTAR 300 UNIT/ML Solostar Pen Inject 40 Units into the skin daily.     valACYclovir (VALTREX) 1000 MG tablet Take 1,000-2,000 mg by mouth as needed (fever blisters).     Vitamin D, Ergocalciferol, (DRISDOL) 1.25 MG (50000 UT) CAPS capsule Take 50,000 Units by mouth once a week.     No current facility-administered medications for this visit.    HISTORY OF PRESENT ILLNESS:    Oncology History  Iron deficiency anemia    REVIEW OF SYSTEMS:  Review of Systems  Constitutional:  Positive for appetite change and fatigue. Negative for chills, diaphoresis, fever and unexpected weight change.  HENT:  Negative.  Negative for hearing loss, lump/mass, mouth sores, nosebleeds, sore throat, tinnitus, trouble swallowing and voice change.   Eyes: Negative.  Negative for eye problems and icterus.  Respiratory: Negative.  Negative for chest tightness, cough, hemoptysis, shortness of breath and wheezing.   Cardiovascular: Negative.  Negative for chest pain, leg swelling and palpitations.  Gastrointestinal:  Positive for diarrhea and nausea. Negative for abdominal distention, abdominal pain, blood in stool, constipation, rectal pain and vomiting.  Endocrine: Negative.   Genitourinary: Negative.  Negative for bladder incontinence, difficulty urinating, dyspareunia, dysuria, frequency, hematuria, menstrual problem, nocturia, pelvic pain, vaginal bleeding and vaginal discharge.   Musculoskeletal:  Negative for arthralgias, back pain, flank pain, gait problem, myalgias, neck pain and neck stiffness.  Skin: Negative.  Negative for itching, rash and wound.  Neurological:  Negative for dizziness, extremity weakness, gait problem, headaches, light-headedness, numbness, seizures and speech difficulty.  Hematological: Negative.  Negative for adenopathy. Does not bruise/bleed easily.  Psychiatric/Behavioral:  Positive for confusion. Negative for decreased concentration, depression, sleep disturbance and suicidal ideas. The patient is not nervous/anxious.    VITALS:  Blood pressure (!) 144/61, pulse 83, temperature 97.8 F (36.6 C), temperature source Oral, resp. rate 18, height 5\' 3"  (1.6 m), weight 165 lb 1.6 oz (74.9 kg), SpO2 100%.  Wt Readings from Last 3 Encounters:  02/17/23 165 lb 1.6 oz (74.9 kg)  01/20/23 164 lb 4.8 oz (74.5 kg)  01/13/23 158 lb (71.7 kg)    Body mass index is  29.25 kg/m.  Performance status (ECOG): 1 - Symptomatic but completely ambulatory  PHYSICAL EXAM:  Physical Exam Vitals and nursing note reviewed.  Constitutional:      General: She is not in acute distress.    Appearance: Normal appearance. She is normal weight. She is not ill-appearing, toxic-appearing or diaphoretic.  HENT:     Head: Normocephalic and atraumatic.     Right Ear: Tympanic membrane, ear canal and external ear normal. There is no impacted cerumen.     Left Ear: Tympanic membrane, ear canal and external ear normal. There is no impacted cerumen.     Nose: Nose normal. No congestion or rhinorrhea.     Mouth/Throat:     Mouth: Mucous membranes are moist.     Pharynx: Oropharynx is clear. No oropharyngeal exudate or posterior oropharyngeal erythema.  Eyes:     General: No scleral icterus.       Right eye: No discharge.        Left eye: No discharge.     Extraocular Movements: Extraocular movements intact.     Conjunctiva/sclera: Conjunctivae normal.     Pupils: Pupils are equal, round, and reactive to light.  Neck:     Vascular: No carotid bruit.  Cardiovascular:     Rate and Rhythm: Normal rate and regular rhythm.     Pulses: Normal pulses.     Heart sounds: Murmur heard.     Systolic murmur is present with a grade of 2/6.     No friction rub. No gallop.  Pulmonary:     Effort: Pulmonary effort is normal. No respiratory distress.     Breath sounds: Normal breath sounds. No stridor. No wheezing, rhonchi or rales.  Chest:     Chest wall: No tenderness.  Abdominal:     General: Bowel sounds are normal. There is distension (slight and mild ascites).     Palpations: Abdomen is soft. There is splenomegaly (mild). There is no hepatomegaly or mass.     Tenderness: There is no abdominal tenderness. There is no right CVA tenderness, left CVA tenderness, guarding or rebound.     Hernia: No hernia is present.     Comments: Mild ascites.  I cannot feel the liver or the  spleen.   Musculoskeletal:        General: No swelling, tenderness, deformity or signs of injury. Normal range of motion.     Cervical back: Normal range of motion and neck supple. No rigidity or tenderness.     Right lower leg: No edema.     Left lower leg: No edema.  Lymphadenopathy:     Cervical: No cervical adenopathy.     Right cervical: No superficial, deep or posterior cervical adenopathy.    Left cervical: No superficial, deep or posterior cervical adenopathy.     Upper Body:     Right upper body: No supraclavicular, axillary or pectoral adenopathy.     Left upper body: No supraclavicular, axillary or pectoral adenopathy.  Skin:    General: Skin is warm and dry.     Coloration: Skin is not jaundiced or pale.     Findings: No bruising, erythema, lesion or rash.  Neurological:     General: No focal deficit present.     Mental Status: She is alert and oriented to person, place, and time. Mental status is at baseline.  Cranial Nerves: No cranial nerve deficit.     Sensory: No sensory deficit.     Motor: No weakness.     Coordination: Coordination normal.     Gait: Gait normal.     Deep Tendon Reflexes: Reflexes normal.  Psychiatric:        Mood and Affect: Mood normal.        Behavior: Behavior normal.        Thought Content: Thought content normal.        Judgment: Judgment normal.     LABORATORY DATA:  I have reviewed the data as listed    Component Value Date/Time   NA 142 02/17/2023 0000   K 3.2 (A) 02/17/2023 0000   CL 104 02/17/2023 0000   CO2 26 (A) 02/17/2023 0000   GLUCOSE 187 (H) 01/14/2023 0118   BUN 16 02/17/2023 0000   CREATININE 1.2 (A) 02/17/2023 0000   CREATININE 1.25 (H) 01/14/2023 0118   CALCIUM 10.6 02/17/2023 0000   PROT 5.4 (L) 01/14/2023 0118   ALBUMIN 4.0 02/17/2023 0000   AST 48 (A) 02/17/2023 0000   ALT 33 02/17/2023 0000   ALKPHOS 53 02/17/2023 0000   BILITOT 1.7 (H) 01/14/2023 0118   GFRNONAA 46 (L) 01/14/2023 0118   GFRAA NOT  CALCULATED 04/18/2011 0607    No results found for: "SPEP", "UPEP"  Lab Results  Component Value Date   WBC 4.9 02/17/2023   NEUTROABS 2.99 02/17/2023   HGB 11.6 (A) 02/17/2023   HCT 34 (A) 02/17/2023   MCV 94.8 01/14/2023   PLT 123 (A) 02/17/2023   Component Ref Range & Units 01/14/23  Vitamin B-12 180 - 914 pg/mL 583   Component Ref Range & Units 01/14/23 2 wk ago  Magnesium 1.7 - 2.4 mg/dL 2.1 2.5 High  CM   Component Ref Range & Units 01/14/23 5 mo ago 1 yr ago  TSH 0.350 - 4.500 uIU/mL 1.542 2.517 CM 3.11 R            Component Ref Range & Units 6 d ago (01/13/23) 2 wk ago (12/30/22) 5 mo ago (08/03/22) 10 mo ago (03/04/22) 1 yr ago (01/03/22) 1 yr ago (06/02/21) 1 yr ago (03/23/21)  Ammonia 9 - 35 umol/L 145 High  77 High  CM <9.0 R 15.0 R <9.0 R <9.0 R <9.0 R     Component Ref Range & Units 5 d ago (01/14/23) 5 d ago (01/14/23) 6 d ago (01/13/23) 6 d ago (01/13/23) 2 wk ago (01/03/23) 2 wk ago (01/03/23) 2 wk ago (01/02/23)  Glucose-Capillary 70 - 99 mg/dL 161 High  096 High  CM 243 High  CM 145 High  CM 192 High  CM 178 High  CM 227 High  CM      Chemistry      Component Value Date/Time   NA 142 02/17/2023 0000   K 3.2 (A) 02/17/2023 0000   CL 104 02/17/2023 0000   CO2 26 (A) 02/17/2023 0000   BUN 16 02/17/2023 0000   CREATININE 1.2 (A) 02/17/2023 0000   CREATININE 1.25 (H) 01/14/2023 0118   GLU 125 02/17/2023 0000      Component Value Date/Time   CALCIUM 10.6 02/17/2023 0000   ALKPHOS 53 02/17/2023 0000   AST 48 (A) 02/17/2023 0000   ALT 33 02/17/2023 0000   BILITOT 1.7 (H) 01/14/2023 0118       RADIOGRAPHIC STUDIES: No results found.   I,Jasmine M Lassiter,acting as a Neurosurgeon for [employer]  Gilman Buttner, MD.,have documented all relevant documentation on the behalf of [name], MD,as directed by  [name], MD while in the presence of [name], MD.

## 2023-02-21 ENCOUNTER — Encounter: Payer: Self-pay | Admitting: Oncology

## 2023-02-22 ENCOUNTER — Ambulatory Visit: Payer: Medicare Other | Admitting: Oncology

## 2023-02-22 ENCOUNTER — Other Ambulatory Visit: Payer: Medicare Other

## 2023-02-23 ENCOUNTER — Other Ambulatory Visit: Payer: Self-pay | Admitting: Oncology

## 2023-02-23 ENCOUNTER — Encounter: Payer: Self-pay | Admitting: Oncology

## 2023-02-23 DIAGNOSIS — G629 Polyneuropathy, unspecified: Secondary | ICD-10-CM

## 2023-02-23 DIAGNOSIS — K7460 Unspecified cirrhosis of liver: Secondary | ICD-10-CM

## 2023-02-23 MED ORDER — ONDANSETRON HCL 4 MG PO TABS: 4.0000 mg | ORAL_TABLET | ORAL | 3 refills | Status: AC | PRN

## 2023-02-23 MED ORDER — PREGABALIN 75 MG PO CAPS
75.0000 mg | ORAL_CAPSULE | Freq: Every day | ORAL | 5 refills | Status: AC
Start: 2023-02-23 — End: ?

## 2023-03-02 ENCOUNTER — Encounter: Payer: Self-pay | Admitting: Oncology

## 2023-03-08 ENCOUNTER — Encounter: Payer: Self-pay | Admitting: Oncology

## 2023-03-16 DIAGNOSIS — R809 Proteinuria, unspecified: Secondary | ICD-10-CM | POA: Diagnosis not present

## 2023-03-16 DIAGNOSIS — M810 Age-related osteoporosis without current pathological fracture: Secondary | ICD-10-CM | POA: Diagnosis not present

## 2023-03-16 DIAGNOSIS — R509 Fever, unspecified: Secondary | ICD-10-CM | POA: Diagnosis not present

## 2023-03-16 DIAGNOSIS — K7581 Nonalcoholic steatohepatitis (NASH): Secondary | ICD-10-CM | POA: Diagnosis not present

## 2023-03-16 DIAGNOSIS — E1129 Type 2 diabetes mellitus with other diabetic kidney complication: Secondary | ICD-10-CM | POA: Diagnosis not present

## 2023-03-16 DIAGNOSIS — E785 Hyperlipidemia, unspecified: Secondary | ICD-10-CM | POA: Diagnosis not present

## 2023-03-16 DIAGNOSIS — E559 Vitamin D deficiency, unspecified: Secondary | ICD-10-CM | POA: Diagnosis not present

## 2023-03-16 DIAGNOSIS — I10 Essential (primary) hypertension: Secondary | ICD-10-CM | POA: Diagnosis not present

## 2023-03-16 DIAGNOSIS — Z1331 Encounter for screening for depression: Secondary | ICD-10-CM | POA: Diagnosis not present

## 2023-03-16 DIAGNOSIS — K7460 Unspecified cirrhosis of liver: Secondary | ICD-10-CM | POA: Diagnosis not present

## 2023-03-16 DIAGNOSIS — E039 Hypothyroidism, unspecified: Secondary | ICD-10-CM | POA: Diagnosis not present

## 2023-03-16 DIAGNOSIS — D509 Iron deficiency anemia, unspecified: Secondary | ICD-10-CM | POA: Diagnosis not present

## 2023-04-06 ENCOUNTER — Inpatient Hospital Stay: Payer: Medicare Other

## 2023-04-06 ENCOUNTER — Other Ambulatory Visit: Payer: Self-pay | Admitting: Oncology

## 2023-04-06 ENCOUNTER — Encounter: Payer: Self-pay | Admitting: Oncology

## 2023-04-06 ENCOUNTER — Inpatient Hospital Stay: Payer: Medicare Other | Attending: Oncology | Admitting: Oncology

## 2023-04-06 VITALS — BP 139/63 | HR 82 | Temp 98.5°F | Resp 18 | Ht 63.0 in | Wt 164.8 lb

## 2023-04-06 DIAGNOSIS — R188 Other ascites: Secondary | ICD-10-CM

## 2023-04-06 DIAGNOSIS — R040 Epistaxis: Secondary | ICD-10-CM | POA: Insufficient documentation

## 2023-04-06 DIAGNOSIS — K7460 Unspecified cirrhosis of liver: Secondary | ICD-10-CM

## 2023-04-06 DIAGNOSIS — R300 Dysuria: Secondary | ICD-10-CM | POA: Diagnosis not present

## 2023-04-06 DIAGNOSIS — Z79899 Other long term (current) drug therapy: Secondary | ICD-10-CM | POA: Insufficient documentation

## 2023-04-06 DIAGNOSIS — D689 Coagulation defect, unspecified: Secondary | ICD-10-CM | POA: Diagnosis not present

## 2023-04-06 DIAGNOSIS — K7682 Hepatic encephalopathy: Secondary | ICD-10-CM

## 2023-04-06 DIAGNOSIS — D509 Iron deficiency anemia, unspecified: Secondary | ICD-10-CM | POA: Insufficient documentation

## 2023-04-06 DIAGNOSIS — D649 Anemia, unspecified: Secondary | ICD-10-CM | POA: Diagnosis not present

## 2023-04-06 LAB — URINALYSIS, COMPLETE (UACMP) WITH MICROSCOPIC
Bilirubin Urine: NEGATIVE
Glucose, UA: 150 mg/dL — AB
Hgb urine dipstick: NEGATIVE
Ketones, ur: NEGATIVE mg/dL
Nitrite: NEGATIVE
Protein, ur: NEGATIVE mg/dL
Specific Gravity, Urine: 1.008 (ref 1.005–1.030)
pH: 5 (ref 5.0–8.0)

## 2023-04-06 LAB — CBC: RBC: 3.64 — AB (ref 3.87–5.11)

## 2023-04-06 LAB — HEPATIC FUNCTION PANEL
ALT: 23 U/L (ref 7–35)
AST: 38 — AB (ref 13–35)
Alkaline Phosphatase: 67 (ref 25–125)
Bilirubin, Total: 1.2

## 2023-04-06 LAB — BASIC METABOLIC PANEL
BUN: 13 (ref 4–21)
CO2: 24 — AB (ref 13–22)
Chloride: 105 (ref 99–108)
Creatinine: 1 (ref 0.5–1.1)
EGFR: 54
Glucose: 126
Potassium: 3.5 meq/L (ref 3.5–5.1)
Sodium: 138 (ref 137–147)

## 2023-04-06 LAB — COMPREHENSIVE METABOLIC PANEL
Albumin: 4.2 (ref 3.5–5.0)
Calcium: 9.7 (ref 8.7–10.7)

## 2023-04-06 LAB — CBC AND DIFFERENTIAL
HCT: 34 — AB (ref 36–46)
Hemoglobin: 11.4 — AB (ref 12.0–16.0)
Neutrophils Absolute: 2.86
Platelets: 136 10*3/uL — AB (ref 150–400)
WBC: 5.2

## 2023-04-06 MED ORDER — LACTULOSE 10 GM/15ML PO SOLN
20.0000 g | Freq: Three times a day (TID) | ORAL | 5 refills | Status: DC
Start: 2023-04-06 — End: 2023-05-05

## 2023-04-06 NOTE — Progress Notes (Signed)
Patient Care Team: Paulina Fusi, MD as PCP - General (Internal Medicine) Thomasene Ripple, DO as PCP - Cardiology (Cardiology) [name], MD as Consulting Physician (Oncology) Daine Floras, MD as Referring Physician (Gastroenterology)  Clinic Day: 04/06/23  Referring physician: Paulina Fusi, MD  ASSESSMENT & PLAN:  Assessment & Plan: 1. Iron deficiency anemia, felt to be secondary to chronic GI blood loss from AVM's, as well as intermittent epistaxis. She has required regular IV iron replacement and transfusions.  She saw Dr. Landis Gandy at [facility] and he did cauterize her AVM's.  She finally stabilized and stopped bleeding. She has not required transfusions since the summer of 2022.  However her hemoglobin dropped to 9.9 in March of 2023 and she was found to be iron deficient.  She was given IV iron in April 2023, and has not required any since then.   2. Non-alcoholic liver cirrhosis, with intermittent mild thrombocytopenia.  Her anemia is also likely partly related to the cirrhosis.   3. Ascites, improved and stable, for which she is on spironolactone and furosemide.   4. Hepatic encephalopathy.  Her serum ammonia is normal. She has required 3 admission into the hospital with this diagnosis and her serum ammonia was 145. I have stressed to her that she needs to lower her current dose of Lactulose 45mL to 30mL three times daily in order to keep this under control. The Xifaxan is helping.  5. Multiple AVM's of the small bowel seen on capsule endoscopy in [place], treated at [facility] in February 2021. I don't think she is bleeding any longer.  She saw Dr. Landis Gandy in June and two small AVMs were treated with argon plasma coagulation.  Her hemoglobin has been stable and she has not required further transfusions.   6.  Thrombocytopenia, resolved.  Plan: I will refill Lactulose 30mL 3 times daily. She continues Spironolactone twice daily and is taking  Xifaxan 550mg  twice daily. Her WBC is 5.2, hemoglobin is 11.4, and platelet count is 136,000 as of today. Her CMP is nearly normal other than a slightly low potassium of 3.5. I advised her to take oral potassium every day now. Her ammonia level is stable at 14.0. She has been taking the lactulose 45 ml BID instead of TID but having severe diarrhea so I agreed she could go to 30 ml BID.  I will send a copy of today's labs to Dr. Tomasa Blase. I will see her back in 4 months with CBC, CMP, and serum ammonia.  The patient understands the plans discussed today and is in agreement with them.  She knows to contact our office if she develops concerns prior to her next appointment.  I provided 17 minutes of face-to-face time during this this encounter and > 50% was spent counseling as documented under my assessment and plan.   [name], MD  [facility] [address] [place] [address] Dept: [phone] Dept Fax: [phone]   Orders Placed This Encounter  Procedures   Urine Culture   Urinalysis, Complete w Microscopic    CHIEF COMPLAINT:  CC: history of iron deficiency anemia  Current Treatment:  Surveillance   HISTORY OF PRESENT ILLNESS:  [name] is a 72 y.o. female  who we began seeing in December 2019 for iron deficiency anemia. In November 2018, colonoscopy revealed tubular adenomas.  Upper endoscopy in January 2018 did not reveal any specific findings.  She was clearly iron deficient.  She has multiple comorbidities, including diabetes, liver cirrhosis, COPD, hyperlipidemia, and degenerative disc disease.  Testing for any other deficiency or monoclonal spike was negative. She had already been on oral supplement for nearly 1 year with lack of response, so was given IV iron in the form of Feraheme on several occasions. Repeat colonoscopy and EGD were done in August 2020 by Dr. Georgiana Shore.  Biopsy was negative.   When we saw her later in September, the hemoglobin had dropped down to 7.5.  She was transfused with 2 units of packed red blood cells.  Repeat iron studies once again revealed iron deficiency, so was given IV Feraheme.  CT abdomen and pelvis was negative.  She felt so much better after the transfusion that she jumped out of bed and fractured her right foot in a fall.  She was given IV Feraheme again in November.  Capsule endoscopy in November 2020 in [facility] revealed multiple AVM's of the small bowel.  She had cauterization of an AVM of the small bowel at [facility] in February 2021. In March 2021, her hemoglobin dropped back down 7.6.  She therefore received IV Feraheme again in early April.  Abdominal ultrasound in May revealed cirrhosis of the liver with nodular surface, increased echogenicity and heterogeneity.  No focal liver parenchymal lesion was observed.  She has had intermittent mild thrombocytopenia, presumably from her liver cirrhosis. She received IV Feraheme in June 2021, and received 2 units of packed red blood cells at that time. She was admitted to [facility] later in June with generalized weakness, altered mental status, and UTI.  While out of town, she developed severe epistaxis that required packing and 1 unit of packed red blood cells.  CT imaging revealed small amount of blood in the right maxillary and sphenoid sinuses with tube placed in the right paranasal sinus.     She developed a severe epistaxis requiring hospital admission in late July 2021 and had a nasal bullet in place.  Her hemoglobin dropped down to 6.9, she was transfused, and given a dose of IV Feraheme while in the hospital.  She had an episode of hypotension and fever and was felt to be septic, so was transferred to the ICU.  She was placed on broad-spectrum IV antibiotics, and cultures remained negative.  Echocardiogram showed a suspicious mass in the right atrium, which may be attached to the interatrial septum.  The  cardiologist reviewed the echocardiogram and felt that this was likely an artifact and not a mass, vegetation or thrombus. Her hemoglobin was 8.7 at the time of discharge, and she  was seen in August with weakness of her hands,shakiness, dropping objects, fatigue, dyspnea with exertion and moderate abdominal swelling.  She had asterixis, and ammonia level was elevated at 62.  She was therefore placed on lactulose 30 mL twice a day. She was seen a week later with some improvement, but her serum ammonia was still 54, so the lactulose was increased to TID.  She also had hyperkalemia, so her spironolactone 25 mg was held.  She was transfused with 2 units of PRBCs again at the end of September when her hemoglobin dropped down to 7.3.  Iron level was 13.0 with a TIBC of 415 for a saturation of 3.1%. She was scheduled for 2 more doses of IV Feraheme.  She was admitted in October  with a fever of 105 and had E coli sepsis. Lactulose was placed on hold due to  severe diarrhea, adding to her dehydration. Her serum ammonia came down to 9, so we did not resume lactulose.  She did have a new splenic infarction as of this admission, which was causing a lot of left abdominal pain.  Her spironolactone was increased to 50 mg to take 1 to 2 daily.  At her visit in November, she was placed back on lactulose daily.   When she was seen in early February 2022, her CBC reveals a hemoglobin of 7.6 with normal ANC and platelets.  She was therefore transfused 2 units of PRBCs. Iron studies revealed recurrent iron deficiency.. Ammonia level was elevated at 35,  so her lactulose was increased to 30 cc twice daily.  She continued to have blood loss with black stools and one episode of moderate epistaxis,  She saw the gastroenterologist at [facility] on May 27th, Dr. Vonita Moss, and had an EGD with plasma coagulation to cauterize two AVM's.  He has now recommended observation and will see her back if her hemoglobin drops again, and we document  iron deficiency.  She last received IV iron in July/August 2022. She was seen last month, November 2022, for worsening symptoms and was found to have increased ascites. I recommended increase of spironolactone to 50 mg BID. Her hemoglobin was stable and serum ammonia was normal.    INTERVAL HISTORY:  Abbagail is here today for an added on appointment for hepatic encephalopathy and history of iron deficiency anemia. Patient states that she feels well and has no complaints of pain. She developed acute bronchitis after a recent cruise. She informed me that she has had 10 deaths this year of family and friends, and the most recent in August and she is understandably grieving. She cut back on Lactulose to twice a day as her symptoms have improved and her diarrhea was severe. I will refill Lactulose 30mL twice daily now that she is on the Xifaxan. She continues Spironolactone twice daily and is taking Xifaxan 550 mg twice daily. Her WBC is 5.2, hemoglobin is 11.4, and platelet count is 136,000 as of today. Her CMP is nearly normal other than a slightly low potassium of 3.5. I advised her to take oral potassium every day now. Her ammonia level is stable at 14.0. I will send a copy of today's labs to Dr. Tomasa Blase. I will see her back in 4 months with CBC, CMP, and serum ammonia. She denies signs of infection such as sore throat, sinus drainage, or urinary symptoms.  She denies fevers or recurrent chills. She denies pain. She denies nausea, vomiting, chest pain, dyspnea or cough. Her appetite is fair  and she gets hungrier at night and her weight has decreased 1 pounds over last 1.5 months .   I have reviewed the past medical history, past surgical history, social history and family history with the patient and they are unchanged from previous note.  ALLERGIES:  has No Known Allergies.  MEDICATIONS:  Current Outpatient Medications  Medication Sig Dispense Refill   albuterol (VENTOLIN HFA) 108 (90 Base) MCG/ACT  inhaler Inhale 2 puffs into the lungs as needed for wheezing or shortness of breath.     furosemide (LASIX) 40 MG tablet Take 40 mg by mouth daily.     insulin aspart (NOVOLOG) 100 UNIT/ML FlexPen Inject 30 Units into the skin 2 (two) times daily.     lactulose (CHRONULAC) 10 GM/15ML solution Take 30 mLs (20 g total) by mouth 3 (three) times daily. 236 mL 5  metFORMIN (GLUCOPHAGE-XR) 500 MG 24 hr tablet Take 500 mg by mouth 2 (two) times daily with a meal.  5   ondansetron (ZOFRAN) 4 MG tablet Take 1 tablet (4 mg total) by mouth every 4 (four) hours as needed for nausea. 60 tablet 3   OZEMPIC, 1 MG/DOSE, 4 MG/3ML SOPN Inject 1 mg into the skin once a week.     potassium chloride SA (KLOR-CON M) 20 MEQ tablet Take 1 tablet (20 mEq total) by mouth 2 (two) times daily. After 1 week, change to 1 pill by mouth once daily (Patient taking differently: Take 20 mEq by mouth daily. After 1 week, change to 1 pill by mouth once daily) 40 tablet 5   pregabalin (LYRICA) 75 MG capsule Take 1 capsule (75 mg total) by mouth daily. At bedtime 30 capsule 5   rOPINIRole (REQUIP) 2 MG tablet Take 2 mg by mouth at bedtime.  3   spironolactone (ALDACTONE) 50 MG tablet Take 50 mg by mouth 2 (two) times daily.     TOUJEO MAX SOLOSTAR 300 UNIT/ML Solostar Pen Inject 40 Units into the skin daily.     valACYclovir (VALTREX) 1000 MG tablet Take 1,000-2,000 mg by mouth as needed (fever blisters).     Vitamin D, Ergocalciferol, (DRISDOL) 1.25 MG (50000 UT) CAPS capsule Take 50,000 Units by mouth once a week.     XIFAXAN 550 MG TABS tablet Take 550 mg by mouth 2 (two) times daily.     No current facility-administered medications for this visit.    HISTORY OF PRESENT ILLNESS:   Oncology History  Iron deficiency anemia    REVIEW OF SYSTEMS:  Review of Systems  Constitutional:  Positive for fatigue. Negative for appetite change, chills, diaphoresis, fever and unexpected weight change.  HENT:  Negative.  Negative for  hearing loss, lump/mass, mouth sores, nosebleeds, sore throat, tinnitus, trouble swallowing and voice change.   Eyes: Negative.  Negative for eye problems and icterus.  Respiratory: Negative.  Negative for chest tightness, cough, hemoptysis, shortness of breath and wheezing.   Cardiovascular: Negative.  Negative for chest pain, leg swelling and palpitations.  Gastrointestinal:  Negative for abdominal distention, abdominal pain, blood in stool, constipation, diarrhea, nausea, rectal pain and vomiting.  Endocrine: Negative.   Genitourinary: Negative.  Negative for bladder incontinence, difficulty urinating, dyspareunia, dysuria, frequency, hematuria, menstrual problem, nocturia, pelvic pain, vaginal bleeding and vaginal discharge.   Musculoskeletal:  Negative for arthralgias, back pain, flank pain, gait problem, myalgias, neck pain and neck stiffness.  Skin: Negative.  Negative for itching, rash and wound.  Neurological:  Negative for dizziness, extremity weakness, gait problem, headaches, light-headedness, numbness, seizures and speech difficulty.  Hematological: Negative.  Negative for adenopathy. Does not bruise/bleed easily.  Psychiatric/Behavioral: Negative.  Negative for confusion, decreased concentration, depression, sleep disturbance and suicidal ideas. The patient is not nervous/anxious.    VITALS:  Blood pressure 139/63, pulse 82, temperature 98.5 F (36.9 C), temperature source Oral, resp. rate 18, height 5\' 3"  (1.6 m), weight 164 lb 12.8 oz (74.8 kg), SpO2 100%.  Wt Readings from Last 3 Encounters:  04/06/23 164 lb 12.8 oz (74.8 kg)  02/17/23 165 lb 1.6 oz (74.9 kg)  01/20/23 164 lb 4.8 oz (74.5 kg)    Body mass index is 29.19 kg/m.  Performance status (ECOG): 1 - Symptomatic but completely ambulatory  PHYSICAL EXAM:  Physical Exam Vitals and nursing note reviewed.  Constitutional:      General: She is not in acute distress.  Appearance: Normal appearance. She is normal  weight. She is not ill-appearing, toxic-appearing or diaphoretic.  HENT:     Head: Normocephalic and atraumatic.     Right Ear: Tympanic membrane, ear canal and external ear normal. There is no impacted cerumen.     Left Ear: Tympanic membrane, ear canal and external ear normal. There is no impacted cerumen.     Nose: Nose normal. No congestion or rhinorrhea.     Mouth/Throat:     Mouth: Mucous membranes are moist.     Pharynx: Oropharynx is clear. No oropharyngeal exudate or posterior oropharyngeal erythema.  Eyes:     General: No scleral icterus.       Right eye: No discharge.        Left eye: No discharge.     Extraocular Movements: Extraocular movements intact.     Conjunctiva/sclera: Conjunctivae normal.     Pupils: Pupils are equal, round, and reactive to light.  Neck:     Vascular: No carotid bruit.  Cardiovascular:     Rate and Rhythm: Normal rate and regular rhythm.     Pulses: Normal pulses.     Heart sounds: Normal heart sounds. No murmur heard.    No friction rub. No gallop.  Pulmonary:     Effort: Pulmonary effort is normal. No respiratory distress.     Breath sounds: Normal breath sounds. No stridor. No wheezing, rhonchi or rales.  Chest:     Chest wall: No tenderness.  Abdominal:     General: Bowel sounds are normal. There is distension.     Palpations: Abdomen is soft. There is no hepatomegaly, splenomegaly or mass.     Tenderness: There is no abdominal tenderness. There is no right CVA tenderness, left CVA tenderness, guarding or rebound.     Hernia: No hernia is present.     Comments: Mild to moderate ascites.   Musculoskeletal:        General: No swelling, tenderness, deformity or signs of injury. Normal range of motion.     Cervical back: Normal range of motion and neck supple. No rigidity or tenderness.     Right lower leg: No edema.     Left lower leg: No edema.  Lymphadenopathy:     Cervical: No cervical adenopathy.     Right cervical: No superficial,  deep or posterior cervical adenopathy.    Left cervical: No superficial, deep or posterior cervical adenopathy.     Upper Body:     Right upper body: No supraclavicular, axillary or pectoral adenopathy.     Left upper body: No supraclavicular, axillary or pectoral adenopathy.  Skin:    General: Skin is warm and dry.     Coloration: Skin is not jaundiced or pale.     Findings: No bruising, erythema, lesion or rash.  Neurological:     General: No focal deficit present.     Mental Status: She is alert and oriented to person, place, and time. Mental status is at baseline.     Cranial Nerves: No cranial nerve deficit.     Sensory: No sensory deficit.     Motor: No weakness.     Coordination: Coordination normal.     Gait: Gait normal.     Deep Tendon Reflexes: Reflexes normal.  Psychiatric:        Mood and Affect: Mood normal.        Behavior: Behavior normal.        Thought Content: Thought content normal.  Judgment: Judgment normal.     LABORATORY DATA:  I have reviewed the data as listed    Component Value Date/Time   NA 138 04/06/2023 0000   K 3.5 04/06/2023 0000   CL 105 04/06/2023 0000   CO2 24 (A) 04/06/2023 0000   GLUCOSE 187 (H) 01/14/2023 0118   BUN 13 04/06/2023 0000   CREATININE 1.0 04/06/2023 0000   CREATININE 1.25 (H) 01/14/2023 0118   CALCIUM 9.7 04/06/2023 0000   PROT 5.4 (L) 01/14/2023 0118   ALBUMIN 4.2 04/06/2023 0000   AST 38 (A) 04/06/2023 0000   ALT 23 04/06/2023 0000   ALKPHOS 67 04/06/2023 0000   BILITOT 1.7 (H) 01/14/2023 0118   GFRNONAA 46 (L) 01/14/2023 0118   GFRAA NOT CALCULATED 04/18/2011 0607    No results found for: "SPEP", "UPEP"  Lab Results  Component Value Date   WBC 5.2 04/06/2023   NEUTROABS 2.86 04/06/2023   HGB 11.4 (A) 04/06/2023   HCT 34 (A) 04/06/2023   MCV 94.8 01/14/2023   PLT 136 (A) 04/06/2023   Component Ref Range & Units 01/14/23  Vitamin B-12 180 - 914 pg/mL 583   Component Ref Range & Units 01/14/23 2  wk ago  Magnesium 1.7 - 2.4 mg/dL 2.1 2.5 High  CM   Component Ref Range & Units 01/14/23 5 mo ago 1 yr ago  TSH 0.350 - 4.500 uIU/mL 1.542 2.517 CM 3.11 R            Component Ref Range & Units 6 d ago (01/13/23) 2 wk ago (12/30/22) 5 mo ago (08/03/22) 10 mo ago (03/04/22) 1 yr ago (01/03/22) 1 yr ago (06/02/21) 1 yr ago (03/23/21)  Ammonia 9 - 35 umol/L 145 High  77 High  CM <9.0 R 15.0 R <9.0 R <9.0 R <9.0 R     Component Ref Range & Units 5 d ago (01/14/23) 5 d ago (01/14/23) 6 d ago (01/13/23) 6 d ago (01/13/23) 2 wk ago (01/03/23) 2 wk ago (01/03/23) 2 wk ago (01/02/23)  Glucose-Capillary 70 - 99 mg/dL 960 High  454 High  CM 243 High  CM 145 High  CM 192 High  CM 178 High  CM 227 High  CM      Chemistry      Component Value Date/Time   NA 138 04/06/2023 0000   K 3.5 04/06/2023 0000   CL 105 04/06/2023 0000   CO2 24 (A) 04/06/2023 0000   BUN 13 04/06/2023 0000   CREATININE 1.0 04/06/2023 0000   CREATININE 1.25 (H) 01/14/2023 0118   GLU 126 04/06/2023 0000      Component Value Date/Time   CALCIUM 9.7 04/06/2023 0000   ALKPHOS 67 04/06/2023 0000   AST 38 (A) 04/06/2023 0000   ALT 23 04/06/2023 0000   BILITOT 1.7 (H) 01/14/2023 0118       RADIOGRAPHIC STUDIES: No results found.   I,Jasmine M Lassiter,acting as a scribe for [name], MD.,have documented all relevant documentation on the behalf of [name], MD,as directed by  [name], MD while in the presence of [name], MD.

## 2023-04-07 ENCOUNTER — Telehealth: Payer: Self-pay | Admitting: Oncology

## 2023-04-07 LAB — URINE CULTURE: Culture: NO GROWTH

## 2023-04-07 LAB — AMMONIA: Ammonia: 14

## 2023-04-07 NOTE — Telephone Encounter (Signed)
Patient has been scheduled. Aware of appt date and time.     Scheduling Message Entered by Gery Pray H on 04/06/2023 at  3:02 PM Priority: Routine <No visit type provided>  Department: [facility]  Provider:  Scheduling Notes:  RT 4 months with labs

## 2023-04-10 DIAGNOSIS — Z9181 History of falling: Secondary | ICD-10-CM | POA: Diagnosis not present

## 2023-04-10 DIAGNOSIS — Z0000 Encounter for general adult medical examination without abnormal findings: Secondary | ICD-10-CM | POA: Diagnosis not present

## 2023-04-28 ENCOUNTER — Encounter: Payer: Self-pay | Admitting: Oncology

## 2023-05-05 ENCOUNTER — Other Ambulatory Visit: Payer: Self-pay | Admitting: Oncology

## 2023-05-05 DIAGNOSIS — K7682 Hepatic encephalopathy: Secondary | ICD-10-CM

## 2023-05-11 DIAGNOSIS — L853 Xerosis cutis: Secondary | ICD-10-CM | POA: Diagnosis not present

## 2023-05-11 DIAGNOSIS — D692 Other nonthrombocytopenic purpura: Secondary | ICD-10-CM | POA: Diagnosis not present

## 2023-05-11 DIAGNOSIS — L821 Other seborrheic keratosis: Secondary | ICD-10-CM | POA: Diagnosis not present

## 2023-05-11 DIAGNOSIS — L820 Inflamed seborrheic keratosis: Secondary | ICD-10-CM | POA: Diagnosis not present

## 2023-05-11 DIAGNOSIS — L570 Actinic keratosis: Secondary | ICD-10-CM | POA: Diagnosis not present

## 2023-05-11 DIAGNOSIS — M7130 Other bursal cyst, unspecified site: Secondary | ICD-10-CM | POA: Diagnosis not present

## 2023-05-29 ENCOUNTER — Other Ambulatory Visit: Payer: Self-pay | Admitting: Hematology and Oncology

## 2023-05-29 DIAGNOSIS — K7682 Hepatic encephalopathy: Secondary | ICD-10-CM

## 2023-08-04 ENCOUNTER — Other Ambulatory Visit: Payer: Medicare Other

## 2023-08-04 ENCOUNTER — Ambulatory Visit: Payer: Medicare Other | Admitting: Hematology and Oncology

## 2023-08-09 ENCOUNTER — Encounter: Payer: Self-pay | Admitting: Oncology

## 2023-08-09 ENCOUNTER — Telehealth: Payer: Self-pay | Admitting: Oncology

## 2023-08-09 ENCOUNTER — Inpatient Hospital Stay: Payer: Medicare Other | Attending: Oncology

## 2023-08-09 ENCOUNTER — Other Ambulatory Visit: Payer: Self-pay

## 2023-08-09 ENCOUNTER — Inpatient Hospital Stay (HOSPITAL_BASED_OUTPATIENT_CLINIC_OR_DEPARTMENT_OTHER): Payer: Medicare Other | Admitting: Oncology

## 2023-08-09 ENCOUNTER — Other Ambulatory Visit: Payer: Self-pay | Admitting: Oncology

## 2023-08-09 VITALS — BP 152/71 | HR 82 | Temp 97.7°F | Resp 16 | Ht 63.0 in | Wt 168.1 lb

## 2023-08-09 DIAGNOSIS — D509 Iron deficiency anemia, unspecified: Secondary | ICD-10-CM | POA: Insufficient documentation

## 2023-08-09 DIAGNOSIS — K7460 Unspecified cirrhosis of liver: Secondary | ICD-10-CM

## 2023-08-09 DIAGNOSIS — R7989 Other specified abnormal findings of blood chemistry: Secondary | ICD-10-CM | POA: Diagnosis not present

## 2023-08-09 DIAGNOSIS — K7682 Hepatic encephalopathy: Secondary | ICD-10-CM | POA: Insufficient documentation

## 2023-08-09 DIAGNOSIS — K7469 Other cirrhosis of liver: Secondary | ICD-10-CM

## 2023-08-09 DIAGNOSIS — R188 Other ascites: Secondary | ICD-10-CM | POA: Insufficient documentation

## 2023-08-09 DIAGNOSIS — E119 Type 2 diabetes mellitus without complications: Secondary | ICD-10-CM | POA: Insufficient documentation

## 2023-08-09 DIAGNOSIS — D696 Thrombocytopenia, unspecified: Secondary | ICD-10-CM | POA: Diagnosis not present

## 2023-08-09 LAB — CBC WITH DIFFERENTIAL (CANCER CENTER ONLY)
Abs Immature Granulocytes: 0.01 10*3/uL (ref 0.00–0.07)
Basophils Absolute: 0 10*3/uL (ref 0.0–0.1)
Basophils Relative: 1 %
Eosinophils Absolute: 0.3 10*3/uL (ref 0.0–0.5)
Eosinophils Relative: 7 %
HCT: 36.1 % (ref 36.0–46.0)
Hemoglobin: 12.4 g/dL (ref 12.0–15.0)
Immature Granulocytes: 0 %
Immature Platelet Fraction: 4.2 % (ref 1.2–8.6)
Lymphocytes Relative: 28 %
Lymphs Abs: 1.2 10*3/uL (ref 0.7–4.0)
MCH: 30.8 pg (ref 26.0–34.0)
MCHC: 34.3 g/dL (ref 30.0–36.0)
MCV: 89.8 fL (ref 80.0–100.0)
Monocytes Absolute: 0.5 10*3/uL (ref 0.1–1.0)
Monocytes Relative: 11 %
Neutro Abs: 2.3 10*3/uL (ref 1.7–7.7)
Neutrophils Relative %: 53 %
Platelet Count: 129 10*3/uL — ABNORMAL LOW (ref 150–400)
RBC: 4.02 MIL/uL (ref 3.87–5.11)
RDW: 14.3 % (ref 11.5–15.5)
WBC Count: 4.4 10*3/uL (ref 4.0–10.5)
nRBC: 0 % (ref 0.0–0.2)
nRBC: 0 /100{WBCs}

## 2023-08-09 LAB — CMP (CANCER CENTER ONLY)
ALT: 19 U/L (ref 0–44)
AST: 31 U/L (ref 15–41)
Albumin: 4.2 g/dL (ref 3.5–5.0)
Alkaline Phosphatase: 88 U/L (ref 38–126)
Anion gap: 11 (ref 5–15)
BUN: 13 mg/dL (ref 8–23)
CO2: 26 mmol/L (ref 22–32)
Calcium: 10 mg/dL (ref 8.9–10.3)
Chloride: 103 mmol/L (ref 98–111)
Creatinine: 1.05 mg/dL — ABNORMAL HIGH (ref 0.44–1.00)
GFR, Estimated: 55 mL/min — ABNORMAL LOW (ref >60)
Glucose, Bld: 108 mg/dL — ABNORMAL HIGH (ref 70–99)
Potassium: 3.2 mmol/L — ABNORMAL LOW (ref 3.5–5.1)
Sodium: 140 mmol/L (ref 135–145)
Total Bilirubin: 1.1 mg/dL (ref 0.0–1.2)
Total Protein: 7.1 g/dL (ref 6.5–8.1)

## 2023-08-09 LAB — AMMONIA: Ammonia: 23 umol/L (ref 9–35)

## 2023-08-09 NOTE — Progress Notes (Signed)
mmon 

## 2023-08-09 NOTE — Telephone Encounter (Signed)
 08/09/23 Spoke with patient and confirmed next appt,

## 2023-08-09 NOTE — Progress Notes (Addendum)
 Patient Care Team: Adrian Hopper, MD as PCP - General (Internal Medicine) Jerryl Morin, DO as PCP - Cardiology (Cardiology) Nolia Baumgartner, MD as Consulting Physician (Oncology) Carmelina Chinchilla, MD as Referring Physician (Gastroenterology)  Clinic Day: 08/09/23   Referring physician: Adrian Hopper, MD  ASSESSMENT & PLAN:  Assessment: 1. Iron deficiency anemia in the past. Now resolved since Dr. Sibyl Drafts at [facility] did cauterize her AVM's. She was given IV iron in April 2023, and has not required any since then.   2. Non-alcoholic liver cirrhosis, with intermittent mild thrombocytopenia.    3. Ascites, improved and stable, for which she is on spironolactone  and furosemide .   4. Hepatic encephalopathy.  Her serum ammonia is normal at 23 today. She has required 3 admission into the hospital with this diagnosis and her serum ammonia was 145. I have stressed to her that she needs to lower her current dose of Lactulose  45mL to 30mL three times daily in order to keep this under control. The Xifaxan is helping.  5. Multiple AVM's of the small bowel seen on capsule endoscopy in [place], treated at [facility] in February 2021. I don't think she is bleeding any longer.  She saw Dr. Sibyl Drafts in June and two small AVMs were treated with argon plasma coagulation.  Her hemoglobin has been stable and she has not required further transfusions.   6. Diabetes. Her blood sugar was good when we checked it but dropped during her appointment to 53. Snacks and drinks were provided and this is monitored this with the FreeStyle Libre 3.  7. Thrombocytopenia, mild.  Plan: I will order a ultrasound of the right upper quadrant for further evaluation of her pain. She has a WBC of 4.4, hemoglobin of 12.4, and a low platelet count of 129,000 down from 136,000. Her CMP is normal besides a low potassium of 3.2 and elevated creatinine of 1.05. Her serum ammonia is pending. She takes 1  potassium supplement a day and I instructed her to take this twice daily. I will leave it up to her PCP as to whether her diuretics need to be adjusted. Her glucose today is 108 and dropped to 53 during her appointment today. Snacks and drinks were provided and this is monitored this with the FreeStyle Libre 3. I will see her back in 3 months with CBC, CMP, serum ammonia, and AFP. The patient understands the plans discussed today and is in agreement with them.  She knows to contact our office if she develops concerns prior to her next appointment.  I provided 15 minutes of face-to-face time during this this encounter and > 50% was spent counseling as documented under my assessment and plan.   Nolia Baumgartner, MD  [facility] AT [facility] [address] [place] [address] Dept: [phone] Dept Fax: [phone]   No orders of the defined types were placed in this encounter.   CHIEF COMPLAINT:  CC: history of iron deficiency anemia  Current Treatment:  Surveillance   HISTORY OF PRESENT ILLNESS:  [name] is a 73 y.o. female  who we began seeing in December 2019 for iron deficiency anemia. In November 2018, colonoscopy revealed tubular adenomas.  Upper endoscopy in January 2018 did not reveal any specific findings.  She was clearly iron deficient.  She has multiple comorbidities, including diabetes, liver cirrhosis, COPD, hyperlipidemia, and degenerative disc disease.  Testing for any other deficiency or monoclonal  spike was negative. She had already been on oral supplement for nearly 1 year with lack of response, so was given IV iron in the form of Feraheme  on several occasions. Repeat colonoscopy and EGD were done in August 2020 by Dr. Jonita Neth.  Biopsy was negative.  When we saw her later in September, the hemoglobin had dropped down to 7.5.  She was transfused with 2 units of packed red blood cells.  Repeat iron studies once  again revealed iron deficiency, so was given IV Feraheme .  CT abdomen and pelvis was negative.  She felt so much better after the transfusion that she jumped out of bed and fractured her right foot in a fall.  She was given IV Feraheme  again in November.  Capsule endoscopy in November 2020 in [facility] revealed multiple AVM's of the small bowel.  She had cauterization of an AVM of the small bowel at [facility] in February 2021. In March 2021, her hemoglobin dropped back down 7.6.  She therefore received IV Feraheme  again in early April.  Abdominal ultrasound in May revealed cirrhosis of the liver with nodular surface, increased echogenicity and heterogeneity.  No focal liver parenchymal lesion was observed.  She has had intermittent mild thrombocytopenia, presumably from her liver cirrhosis. She received IV Feraheme  in June 2021, and received 2 units of packed red blood cells at that time. She was admitted to [facility] later in June with generalized weakness, altered mental status, and UTI.  While out of town, she developed severe epistaxis that required packing and 1 unit of packed red blood cells.  CT imaging revealed small amount of blood in the right maxillary and sphenoid sinuses with tube placed in the right paranasal sinus.     She developed a severe epistaxis requiring hospital admission in late July 2021 and had a nasal bullet in place.  Her hemoglobin dropped down to 6.9, she was transfused, and given a dose of IV Feraheme  while in the hospital.  She had an episode of hypotension and fever and was felt to be septic, so was transferred to the ICU.  She was placed on broad-spectrum IV antibiotics, and cultures remained negative.  Echocardiogram showed a suspicious mass in the right atrium, which may be attached to the interatrial septum.  The cardiologist reviewed the echocardiogram and felt that this was likely an artifact and not a mass, vegetation or thrombus. Her hemoglobin was 8.7 at the time of  discharge, and she  was seen in August with weakness of her hands,shakiness, dropping objects, fatigue, dyspnea with exertion and moderate abdominal swelling.  She had asterixis, and ammonia level was elevated at 62.  She was therefore placed on lactulose  30 mL twice a day. She was seen a week later with some improvement, but her serum ammonia was still 54, so the lactulose  was increased to TID.  She also had hyperkalemia, so her spironolactone  25 mg was held.  She was transfused with 2 units of PRBCs again at the end of September when her hemoglobin dropped down to 7.3.  Iron level was 13.0 with a TIBC of 415 for a saturation of 3.1%. She was scheduled for 2 more doses of IV Feraheme .  She was admitted in October  with a fever of 105 and had E coli sepsis. Lactulose  was placed on hold due to severe diarrhea, adding to her dehydration. Her serum ammonia came down to 9, so we did not resume lactulose .  She did have a new splenic infarction as  of this admission, which was causing a lot of left abdominal pain.  Her spironolactone  was increased to 50 mg to take 1 to 2 daily.  At her visit in November, she was placed back on lactulose  daily.   When she was seen in early February 2022, her CBC reveals a hemoglobin of 7.6 with normal ANC and platelets.  She was therefore transfused 2 units of PRBCs. Iron studies revealed recurrent iron deficiency.. Ammonia level was elevated at 35,  so her lactulose  was increased to 30 cc twice daily.  She continued to have blood loss with black stools and one episode of moderate epistaxis,  She saw the gastroenterologist at [facility] on May 27th, Dr. Magda Schneider, and had an EGD with plasma coagulation to cauterize two AVM's.  He has now recommended observation and will see her back if her hemoglobin drops again, and we document iron deficiency.  She last received IV iron in July/August 2022. She was seen last month, November 2022, for worsening symptoms and was found to have increased  ascites. I recommended increase of spironolactone  to 50 mg BID. Her hemoglobin was stable and serum ammonia was normal.    INTERVAL HISTORY:  Terry is here today for a routine appointment for non alcoholic liver cirrhosis and hepatic encephalopathy with history of iron deficiency anemia. She no longer requires iron supplement. Patient states that she feels ok but complains of sharp frequent pain in her right upper quadrant. I will order a ultrasound of the right upper quadrant for further evaluation. She has a WBC of 4.4, hemoglobin of 12.4, and a low platelet count of 129,000 down from 136,000. Her CMP is normal besides a low potassium of 3.2 and elevated creatinine of 1.05.   Her serum ammonia is normal at 23 today. She takes 1 potassium supplement a day and I instructed her to take this twice daily. I will leave it up to her PCP as to whether her diuretics need to be adjusted. Her glucose today was 108 and then dropped to 53 during her appointment today. Snacks and drinks were provided and this is monitored this with the FreeStyle Libre 3. I will see her back in 3 months with CBC, CMP, serum ammonia, and AFP. She denies signs of infection such as sore throat, sinus drainage, cough, or urinary symptoms.  She denies fevers or recurrent chills. She denies pain. She denies nausea, vomiting, chest pain, dyspnea or cough. Her weight has increased 4 pounds over last 4 months .   I have reviewed the past medical history, past surgical history, social history and family history with the patient and they are unchanged from previous note.  ALLERGIES:  has no known allergies.  MEDICATIONS:  Current Outpatient Medications  Medication Sig Dispense Refill   albuterol  (VENTOLIN  HFA) 108 (90 Base) MCG/ACT inhaler Inhale 2 puffs into the lungs as needed for wheezing or shortness of breath.     furosemide  (LASIX ) 40 MG tablet Take 40 mg by mouth daily.     insulin  aspart (NOVOLOG ) 100 UNIT/ML FlexPen Inject 30 Units  into the skin 2 (two) times daily.     lactulose  (CHRONULAC ) 10 GM/15ML solution TAKE 30 MLS (20 G TOTAL) BY MOUTH 3 (THREE) TIMES DAILY. 8514 mL 1   ondansetron  (ZOFRAN ) 4 MG tablet Take 1 tablet (4 mg total) by mouth every 4 (four) hours as needed for nausea. 60 tablet 3   OZEMPIC, 1 MG/DOSE, 4 MG/3ML SOPN Inject 1 mg into the skin once a  week.     potassium chloride  SA (KLOR-CON  M) 20 MEQ tablet Take 1 tablet (20 mEq total) by mouth 2 (two) times daily. After 1 week, change to 1 pill by mouth once daily (Patient taking differently: Take 20 mEq by mouth daily. After 1 week, change to 1 pill by mouth once daily) 40 tablet 5   pregabalin  (LYRICA ) 75 MG capsule Take 1 capsule (75 mg total) by mouth daily. At bedtime 30 capsule 5   rOPINIRole  (REQUIP ) 2 MG tablet Take 2 mg by mouth at bedtime.  3   spironolactone  (ALDACTONE ) 50 MG tablet Take 50 mg by mouth 2 (two) times daily.     TOUJEO  MAX SOLOSTAR 300 UNIT/ML Solostar Pen Inject 40 Units into the skin daily.     valACYclovir (VALTREX) 1000 MG tablet Take 1,000-2,000 mg by mouth as needed (fever blisters).     Vitamin D , Ergocalciferol , (DRISDOL) 1.25 MG (50000 UT) CAPS capsule Take 50,000 Units by mouth once a week.     XIFAXAN 550 MG TABS tablet Take 550 mg by mouth 2 (two) times daily.     No current facility-administered medications for this visit.    HISTORY OF PRESENT ILLNESS:   Oncology History  Iron deficiency anemia    REVIEW OF SYSTEMS:  Review of Systems  Constitutional:  Positive for fatigue. Negative for appetite change, chills, diaphoresis, fever and unexpected weight change.  HENT:  Negative.  Negative for hearing loss, lump/mass, mouth sores, nosebleeds, sore throat, tinnitus, trouble swallowing and voice change.   Eyes: Negative.  Negative for eye problems and icterus.  Respiratory: Negative.  Negative for chest tightness, cough, hemoptysis, shortness of breath and wheezing.   Cardiovascular: Negative.  Negative for  chest pain, leg swelling and palpitations.  Gastrointestinal:  Negative for abdominal distention, abdominal pain, blood in stool, constipation, diarrhea, nausea, rectal pain and vomiting.  Endocrine: Negative.   Genitourinary: Negative.  Negative for bladder incontinence, difficulty urinating, dyspareunia, dysuria, frequency, hematuria, menstrual problem, nocturia, pelvic pain, vaginal bleeding and vaginal discharge.   Musculoskeletal:  Negative for arthralgias, back pain, flank pain, gait problem, myalgias, neck pain and neck stiffness.       Right upper quadrant pain  Skin: Negative.  Negative for itching, rash and wound.  Neurological:  Negative for dizziness, extremity weakness, gait problem, headaches, light-headedness, numbness, seizures and speech difficulty.  Hematological: Negative.  Negative for adenopathy. Does not bruise/bleed easily.  Psychiatric/Behavioral: Negative.  Negative for confusion, decreased concentration, depression, sleep disturbance and suicidal ideas. The patient is not nervous/anxious.    VITALS:  Blood pressure (!) 152/71, pulse 82, temperature 97.7 F (36.5 C), temperature source Oral, resp. rate 16, height 5\' 3"  (1.6 m), weight 168 lb 1.6 oz (76.2 kg), SpO2 96%.  Wt Readings from Last 3 Encounters:  08/09/23 168 lb 1.6 oz (76.2 kg)  04/06/23 164 lb 12.8 oz (74.8 kg)  02/17/23 165 lb 1.6 oz (74.9 kg)    Body mass index is 29.78 kg/m.  Performance status (ECOG): 1 - Symptomatic but completely ambulatory  PHYSICAL EXAM:  Physical Exam Vitals and nursing note reviewed.  Constitutional:      General: She is not in acute distress.    Appearance: Normal appearance. She is normal weight. She is not ill-appearing, toxic-appearing or diaphoretic.  HENT:     Head: Normocephalic and atraumatic.     Right Ear: Tympanic membrane, ear canal and external ear normal. There is no impacted cerumen.     Left Ear: Tympanic  membrane, ear canal and external ear normal. There  is no impacted cerumen.     Nose: Nose normal. No congestion or rhinorrhea.     Mouth/Throat:     Mouth: Mucous membranes are moist.     Pharynx: Oropharynx is clear. No oropharyngeal exudate or posterior oropharyngeal erythema.  Eyes:     General: No scleral icterus.       Right eye: No discharge.        Left eye: No discharge.     Extraocular Movements: Extraocular movements intact.     Conjunctiva/sclera: Conjunctivae normal.     Pupils: Pupils are equal, round, and reactive to light.  Neck:     Vascular: No carotid bruit.  Cardiovascular:     Rate and Rhythm: Normal rate and regular rhythm.     Pulses: Normal pulses.     Heart sounds: Murmur heard.     Systolic murmur is present with a grade of 2/6.     No friction rub. No gallop.  Pulmonary:     Effort: Pulmonary effort is normal. No respiratory distress.     Breath sounds: Normal breath sounds. No stridor. No wheezing, rhonchi or rales.  Chest:     Chest wall: No tenderness.  Abdominal:     General: Bowel sounds are normal. There is no distension.     Palpations: Abdomen is soft. There is no hepatomegaly, splenomegaly or mass.     Tenderness: There is no abdominal tenderness. There is no right CVA tenderness, left CVA tenderness, guarding or rebound.     Hernia: No hernia is present.     Comments: I can feel the liver edge at the right costal margin. Spleen is mildly enlarged. No distension or ascites is appreciated   Musculoskeletal:        General: No swelling, tenderness, deformity or signs of injury. Normal range of motion.     Cervical back: Normal range of motion and neck supple. No rigidity or tenderness.     Right lower leg: No edema.     Left lower leg: No edema.  Lymphadenopathy:     Cervical: No cervical adenopathy.     Right cervical: No superficial, deep or posterior cervical adenopathy.    Left cervical: No superficial, deep or posterior cervical adenopathy.     Upper Body:     Right upper body: No  supraclavicular, axillary or pectoral adenopathy.     Left upper body: No supraclavicular, axillary or pectoral adenopathy.  Skin:    General: Skin is warm and dry.     Coloration: Skin is not jaundiced or pale.     Findings: No bruising, erythema, lesion or rash.  Neurological:     General: No focal deficit present.     Mental Status: She is alert and oriented to person, place, and time. Mental status is at baseline.     Cranial Nerves: No cranial nerve deficit.     Sensory: No sensory deficit.     Motor: No weakness.     Coordination: Coordination normal.     Gait: Gait normal.     Deep Tendon Reflexes: Reflexes normal.  Psychiatric:        Mood and Affect: Mood normal.        Behavior: Behavior normal.        Thought Content: Thought content normal.        Judgment: Judgment normal.     LABORATORY DATA:  I have reviewed the data as listed  Component Value Date/Time   NA 140 08/09/2023 1325   NA 138 04/06/2023 0000   K 3.2 (L) 08/09/2023 1325   CL 103 08/09/2023 1325   CO2 26 08/09/2023 1325   GLUCOSE 108 (H) 08/09/2023 1325   BUN 13 08/09/2023 1325   BUN 13 04/06/2023 0000   CREATININE 1.05 (H) 08/09/2023 1325   CALCIUM 10.0 08/09/2023 1325   PROT 7.1 08/09/2023 1325   ALBUMIN 4.2 08/09/2023 1325   AST 31 08/09/2023 1325   ALT 19 08/09/2023 1325   ALKPHOS 88 08/09/2023 1325   BILITOT 1.1 08/09/2023 1325   GFRNONAA 55 (L) 08/09/2023 1325   GFRAA NOT CALCULATED 04/18/2011 0607   No results found for: "SPEP", "UPEP"  Lab Results  Component Value Date   WBC 4.4 08/09/2023   NEUTROABS 2.3 08/09/2023   HGB 12.4 08/09/2023   HCT 36.1 08/09/2023   MCV 89.8 08/09/2023   PLT 129 (L) 08/09/2023     Chemistry      Component Value Date/Time   NA 140 08/09/2023 1325   NA 138 04/06/2023 0000   K 3.2 (L) 08/09/2023 1325   CL 103 08/09/2023 1325   CO2 26 08/09/2023 1325   BUN 13 08/09/2023 1325   BUN 13 04/06/2023 0000   CREATININE 1.05 (H) 08/09/2023 1325    GLU 126 04/06/2023 0000      Component Value Date/Time   CALCIUM 10.0 08/09/2023 1325   ALKPHOS 88 08/09/2023 1325   AST 31 08/09/2023 1325   ALT 19 08/09/2023 1325   BILITOT 1.1 08/09/2023 1325       RADIOGRAPHIC STUDIES: No results found.   I,Jasmine M Lassiter,acting as a scribe for Nolia Baumgartner, MD.,have documented all relevant documentation on the behalf of Nolia Baumgartner, MD,as directed by  Nolia Baumgartner, MD while in the presence of Nolia Baumgartner, MD.

## 2023-08-11 DIAGNOSIS — K7460 Unspecified cirrhosis of liver: Secondary | ICD-10-CM | POA: Diagnosis not present

## 2023-08-17 ENCOUNTER — Encounter: Payer: Self-pay | Admitting: Oncology

## 2023-08-23 DIAGNOSIS — E1129 Type 2 diabetes mellitus with other diabetic kidney complication: Secondary | ICD-10-CM | POA: Diagnosis not present

## 2023-08-23 DIAGNOSIS — R809 Proteinuria, unspecified: Secondary | ICD-10-CM | POA: Diagnosis not present

## 2023-08-23 DIAGNOSIS — K7581 Nonalcoholic steatohepatitis (NASH): Secondary | ICD-10-CM | POA: Diagnosis not present

## 2023-08-23 DIAGNOSIS — I10 Essential (primary) hypertension: Secondary | ICD-10-CM | POA: Diagnosis not present

## 2023-08-23 DIAGNOSIS — E559 Vitamin D deficiency, unspecified: Secondary | ICD-10-CM | POA: Diagnosis not present

## 2023-08-23 DIAGNOSIS — D509 Iron deficiency anemia, unspecified: Secondary | ICD-10-CM | POA: Diagnosis not present

## 2023-08-23 DIAGNOSIS — M8589 Other specified disorders of bone density and structure, multiple sites: Secondary | ICD-10-CM | POA: Diagnosis not present

## 2023-08-23 DIAGNOSIS — E039 Hypothyroidism, unspecified: Secondary | ICD-10-CM | POA: Diagnosis not present

## 2023-08-23 DIAGNOSIS — Z23 Encounter for immunization: Secondary | ICD-10-CM | POA: Diagnosis not present

## 2023-08-23 DIAGNOSIS — K7460 Unspecified cirrhosis of liver: Secondary | ICD-10-CM | POA: Diagnosis not present

## 2023-08-23 DIAGNOSIS — E785 Hyperlipidemia, unspecified: Secondary | ICD-10-CM | POA: Diagnosis not present

## 2023-08-23 DIAGNOSIS — M810 Age-related osteoporosis without current pathological fracture: Secondary | ICD-10-CM | POA: Diagnosis not present

## 2023-10-03 DIAGNOSIS — M25521 Pain in right elbow: Secondary | ICD-10-CM | POA: Diagnosis not present

## 2023-10-03 DIAGNOSIS — S42402A Unspecified fracture of lower end of left humerus, initial encounter for closed fracture: Secondary | ICD-10-CM | POA: Diagnosis not present

## 2023-10-03 DIAGNOSIS — M8580 Other specified disorders of bone density and structure, unspecified site: Secondary | ICD-10-CM | POA: Diagnosis not present

## 2023-10-03 DIAGNOSIS — M25421 Effusion, right elbow: Secondary | ICD-10-CM | POA: Diagnosis not present

## 2023-10-03 DIAGNOSIS — S52021A Displaced fracture of olecranon process without intraarticular extension of right ulna, initial encounter for closed fracture: Secondary | ICD-10-CM | POA: Diagnosis not present

## 2023-10-04 DIAGNOSIS — S52021A Displaced fracture of olecranon process without intraarticular extension of right ulna, initial encounter for closed fracture: Secondary | ICD-10-CM | POA: Diagnosis not present

## 2023-10-25 DIAGNOSIS — S52021A Displaced fracture of olecranon process without intraarticular extension of right ulna, initial encounter for closed fracture: Secondary | ICD-10-CM | POA: Diagnosis not present

## 2023-11-08 ENCOUNTER — Other Ambulatory Visit: Payer: Self-pay | Admitting: Oncology

## 2023-11-08 ENCOUNTER — Inpatient Hospital Stay: Payer: Medicare Other | Attending: Oncology | Admitting: Oncology

## 2023-11-08 ENCOUNTER — Telehealth: Payer: Self-pay | Admitting: Oncology

## 2023-11-08 ENCOUNTER — Encounter: Payer: Self-pay | Admitting: Oncology

## 2023-11-08 ENCOUNTER — Inpatient Hospital Stay: Payer: Medicare Other

## 2023-11-08 ENCOUNTER — Other Ambulatory Visit: Payer: Self-pay

## 2023-11-08 VITALS — BP 149/79 | HR 79 | Temp 97.5°F | Resp 18 | Ht 63.0 in | Wt 166.8 lb

## 2023-11-08 DIAGNOSIS — R188 Other ascites: Secondary | ICD-10-CM

## 2023-11-08 DIAGNOSIS — Z862 Personal history of diseases of the blood and blood-forming organs and certain disorders involving the immune mechanism: Secondary | ICD-10-CM | POA: Insufficient documentation

## 2023-11-08 DIAGNOSIS — K7469 Other cirrhosis of liver: Secondary | ICD-10-CM

## 2023-11-08 DIAGNOSIS — K7460 Unspecified cirrhosis of liver: Secondary | ICD-10-CM | POA: Diagnosis not present

## 2023-11-08 DIAGNOSIS — K7682 Hepatic encephalopathy: Secondary | ICD-10-CM | POA: Insufficient documentation

## 2023-11-08 DIAGNOSIS — D696 Thrombocytopenia, unspecified: Secondary | ICD-10-CM | POA: Insufficient documentation

## 2023-11-08 DIAGNOSIS — E876 Hypokalemia: Secondary | ICD-10-CM | POA: Insufficient documentation

## 2023-11-08 LAB — CBC WITH DIFFERENTIAL (CANCER CENTER ONLY)
Abs Immature Granulocytes: 0.02 10*3/uL (ref 0.00–0.07)
Basophils Absolute: 0 10*3/uL (ref 0.0–0.1)
Basophils Relative: 1 %
Eosinophils Absolute: 0.3 10*3/uL (ref 0.0–0.5)
Eosinophils Relative: 7 %
HCT: 38.2 % (ref 36.0–46.0)
Hemoglobin: 13 g/dL (ref 12.0–15.0)
Immature Granulocytes: 1 %
Immature Platelet Fraction: 4.6 % (ref 1.2–8.6)
Lymphocytes Relative: 27 %
Lymphs Abs: 1.2 10*3/uL (ref 0.7–4.0)
MCH: 31.8 pg (ref 26.0–34.0)
MCHC: 34 g/dL (ref 30.0–36.0)
MCV: 93.4 fL (ref 80.0–100.0)
Monocytes Absolute: 0.4 10*3/uL (ref 0.1–1.0)
Monocytes Relative: 9 %
Neutro Abs: 2.4 10*3/uL (ref 1.7–7.7)
Neutrophils Relative %: 55 %
Platelet Count: 121 10*3/uL — ABNORMAL LOW (ref 150–400)
RBC: 4.09 MIL/uL (ref 3.87–5.11)
RDW: 14.2 % (ref 11.5–15.5)
WBC Count: 4.3 10*3/uL (ref 4.0–10.5)
nRBC: 0 % (ref 0.0–0.2)
nRBC: 0 /100{WBCs}

## 2023-11-08 LAB — CMP (CANCER CENTER ONLY)
ALT: 15 U/L (ref 0–44)
AST: 27 U/L (ref 15–41)
Albumin: 4.1 g/dL (ref 3.5–5.0)
Alkaline Phosphatase: 79 U/L (ref 38–126)
Anion gap: 10 (ref 5–15)
BUN: 17 mg/dL (ref 8–23)
CO2: 26 mmol/L (ref 22–32)
Calcium: 10.2 mg/dL (ref 8.9–10.3)
Chloride: 106 mmol/L (ref 98–111)
Creatinine: 1.14 mg/dL — ABNORMAL HIGH (ref 0.44–1.00)
GFR, Estimated: 50 mL/min — ABNORMAL LOW (ref >60)
Glucose, Bld: 201 mg/dL — ABNORMAL HIGH (ref 70–99)
Potassium: 3.3 mmol/L — ABNORMAL LOW (ref 3.5–5.1)
Sodium: 142 mmol/L (ref 135–145)
Total Bilirubin: 1.1 mg/dL (ref 0.0–1.2)
Total Protein: 6.8 g/dL (ref 6.5–8.1)

## 2023-11-08 LAB — AMMONIA: Ammonia: 44 umol/L — ABNORMAL HIGH (ref 9–35)

## 2023-11-08 NOTE — Telephone Encounter (Signed)
 Patient has been scheduled for follow-up visit per 11/08/23 LOS.  Pt given an appt calendar with date and time.

## 2023-11-08 NOTE — Progress Notes (Addendum)
 ADDENDUM: Her serium ammonia is elevated at 44.  She is taking 1/4 cup lactulose  bid and was instructed to increase to tid.  I will   need to recheck in 2-4 weeks with repeat labs.    Patient Care Team: Adrian Hopper, MD as PCP - General (Internal Medicine) Jerryl Morin, DO as PCP - Cardiology (Cardiology) Nolia Baumgartner, MD as Consulting Physician (Oncology) Carmelina Chinchilla, MD as Referring Physician (Gastroenterology)  Clinic Day: 11/08/23  Referring physician: Adrian Hopper, MD  ASSESSMENT & PLAN:  Assessment: 1. Iron deficiency anemia in the past. Now resolved since Dr. Sibyl Drafts at [facility] did cauterize her AVM's. She was given IV iron in April 2023, and has not required any since then.   2. Non-alcoholic liver cirrhosis, with intermittent mild thrombocytopenia.    3. Ascites, improved and stable, for which she is on spironolactone  50mg  BID and furosemide  40mg  daily.   4. Hepatic encephalopathy.  Her serum ammonia is elevated at 44 today. She has required 3 admission into the hospital last year with this diagnosis and her serum ammonia was 145. I have stressed that she needs to continue her current dose of Lactulose  45mL to 30mL three times daily in order to keep this under control. The Xifaxan is helping.  5. Multiple AVM's of the small bowel seen on capsule endoscopy in [place], treated at [facility] in February 2021. I don't think she is bleeding any longer.  She saw Dr. Sibyl Drafts in June and two small AVMs were treated with argon plasma coagulation.  Her hemoglobin has been stable and she has not required further transfusions.   6. Thrombocytopenia, mild.  7. Hypokalemia. I have recommended she increase her supplement from one daily to two daily.   Plan: She had a major fall recently and broke her elbow. She was just placed in a cast and had no surgery due to her osteoporosis. They now had to remove the cast due to severe swelling of  the upper arm. She is currently taking oxycodone and ibuprofen for pain management. She has a WBC of 4.3, hemoglobin of 13.0, and low platelet count of 121,000 down from 129,000. Her CMP is normal other than a low potassium of 3.3 and elevated creatinine of 1.14. I instructed her to increase her potassium supplement from once daily to BID. Her AFP tumor marker and ammonia levels are pending and I will call her with the results. I will see her back in 3 months with CBC, CMP, and serum ammonia. The patient understands the plans discussed today and is in agreement with them.  She knows to contact our office if she develops concerns prior to her next appointment.  I provided 14 minutes of face-to-face time during this this encounter and > 50% was spent counseling as documented under my assessment and plan.   Nolia Baumgartner, MD  [facility] - A DEPT OF [facility] [address] [place] [address] Dept: [phone] Dept Fax: [phone]   No orders of the defined types were placed in this encounter.   CHIEF COMPLAINT:  CC: history of iron deficiency anemia  Current Treatment:  Surveillance   HISTORY OF PRESENT ILLNESS:  [name] is a 73 y.o. female  who we began seeing in December 2019 for iron deficiency anemia. In November 2018, colonoscopy revealed tubular adenomas.  Upper endoscopy in January 2018 did not  reveal any specific findings.  She was clearly iron deficient.  She has multiple comorbidities, including diabetes, liver cirrhosis, COPD, hyperlipidemia, and degenerative disc disease.  Testing for any other deficiency or monoclonal spike was negative. She had already been on oral supplement for nearly 1 year with lack of response, so was given IV iron in the form of Feraheme  on several occasions. Repeat colonoscopy and EGD were done in August 2020 by Dr. Jonita Neth.  Biopsy was negative.  When we saw her later in  September, the hemoglobin had dropped down to 7.5.  She was transfused with 2 units of packed red blood cells.  Repeat iron studies once again revealed iron deficiency, so was given IV Feraheme .  CT abdomen and pelvis was negative.  She felt so much better after the transfusion that she jumped out of bed and fractured her right foot in a fall.  She was given IV Feraheme  again in November.  Capsule endoscopy in November 2020 in [facility] revealed multiple AVM's of the small bowel.  She had cauterization of an AVM of the small bowel at [facility] in February 2021. In March 2021, her hemoglobin dropped back down 7.6.  She therefore received IV Feraheme  again in early April.  Abdominal ultrasound in May revealed cirrhosis of the liver with nodular surface, increased echogenicity and heterogeneity.  No focal liver parenchymal lesion was observed.  She has had intermittent mild thrombocytopenia, presumably from her liver cirrhosis. She received IV Feraheme  in June 2021, and received 2 units of packed red blood cells at that time. She was admitted to [facility] later in June with generalized weakness, altered mental status, and UTI.  While out of town, she developed severe epistaxis that required packing and 1 unit of packed red blood cells.  CT imaging revealed small amount of blood in the right maxillary and sphenoid sinuses with tube placed in the right paranasal sinus.     She developed a severe epistaxis requiring hospital admission in late July 2021 and had a nasal bullet in place.  Her hemoglobin dropped down to 6.9, she was transfused, and given a dose of IV Feraheme  while in the hospital.  She had an episode of hypotension and fever and was felt to be septic, so was transferred to the ICU.  She was placed on broad-spectrum IV antibiotics, and cultures remained negative.  Echocardiogram showed a suspicious mass in the right atrium, which may be attached to the interatrial septum.  The cardiologist reviewed the  echocardiogram and felt that this was likely an artifact and not a mass, vegetation or thrombus. Her hemoglobin was 8.7 at the time of discharge, and she  was seen in August with weakness of her hands,shakiness, dropping objects, fatigue, dyspnea with exertion and moderate abdominal swelling.  She had asterixis, and ammonia level was elevated at 62.  She was therefore placed on lactulose  30 mL twice a day. She was seen a week later with some improvement, but her serum ammonia was still 54, so the lactulose  was increased to TID.  She also had hyperkalemia, so her spironolactone  25 mg was held.  She was transfused with 2 units of PRBCs again at the end of September when her hemoglobin dropped down to 7.3.  Iron level was 13.0 with a TIBC of 415 for a saturation of 3.1%. She was scheduled for 2 more doses of IV Feraheme .  She was admitted in October  with a fever of 105 and had E coli sepsis. Lactulose  was  placed on hold due to severe diarrhea, adding to her dehydration. Her serum ammonia came down to 9, so we did not resume lactulose .  She did have a new splenic infarction as of this admission, which was causing a lot of left abdominal pain.  Her spironolactone  was increased to 50 mg to take 1 to 2 daily.  At her visit in November, she was placed back on lactulose  daily.   When she was seen in early February 2022, her CBC reveals a hemoglobin of 7.6 with normal ANC and platelets.  She was therefore transfused 2 units of PRBCs. Iron studies revealed recurrent iron deficiency.. Ammonia level was elevated at 35,  so her lactulose  was increased to 30 cc twice daily.  She continued to have blood loss with black stools and one episode of moderate epistaxis,  She saw the gastroenterologist at [facility] on May 27th, Dr. Magda Schneider, and had an EGD with plasma coagulation to cauterize two AVM's.  He has now recommended observation and will see her back if her hemoglobin drops again, and we document iron deficiency.  She last  received IV iron in July/August 2022. She was seen last month, November 2022, for worsening symptoms and was found to have increased ascites. I recommended increase of spironolactone  to 50 mg BID. Her hemoglobin was stable and serum ammonia was normal.    INTERVAL HISTORY:  Orean is here today for a routine appointment for non alcoholic liver cirrhosis and hepatic encephalopathy with history of iron deficiency anemia. She no longer requires iron supplement. Patient states that she feels well  but complains of right elbow pain from a recent fall. She had a major fall recently and broke her elbow. She was just placed in a cast and had no surgery due to her osteoporosis. They now had to remove the cast due to severe swelling of the upper arm. She is currently taking oxycodone and ibuprofen for pain management. She has a WBC of 4.3, hemoglobin of 13.0, and low platelet count of 121,000 down from 129,000. Her CMP is normal other than a low potassium of 3.3 and elevated creatinine of 1.14. I instructed her to increase her potassium supplement from once daily to BID. Her AFP tumor marker and ammonia levels are pending and I will call her with the results. I will see her back in 3 months with CBC, CMP, and serum ammonia.   She denies signs of infection such as sore throat, sinus drainage, cough, or urinary symptoms.  She denies fevers or recurrent chills. She denies nausea, vomiting, chest pain, dyspnea or cough. Her appetite is good and her weight has decreased 2 pounds over last 3 months .     I have reviewed the past medical history, past surgical history, social history and family history with the patient and they are unchanged from previous note.  ALLERGIES:  has no known allergies.  MEDICATIONS:  Current Outpatient Medications  Medication Sig Dispense Refill   oxyCODONE (OXY IR/ROXICODONE) 5 MG immediate release tablet Take 5 mg by mouth every 4 (four) hours as needed.     albuterol  (VENTOLIN  HFA) 108  (90 Base) MCG/ACT inhaler Inhale 2 puffs into the lungs as needed for wheezing or shortness of breath.     furosemide  (LASIX ) 40 MG tablet Take 40 mg by mouth daily.     insulin  aspart (NOVOLOG ) 100 UNIT/ML FlexPen Inject 30 Units into the skin 2 (two) times daily.     lactulose  (CHRONULAC ) 10 GM/15ML solution TAKE  30 MLS (20 G TOTAL) BY MOUTH 3 (THREE) TIMES DAILY. 8514 mL 1   ondansetron  (ZOFRAN ) 4 MG tablet Take 1 tablet (4 mg total) by mouth every 4 (four) hours as needed for nausea. 60 tablet 3   OZEMPIC, 1 MG/DOSE, 4 MG/3ML SOPN Inject 1 mg into the skin once a week.     potassium chloride  SA (KLOR-CON  M) 20 MEQ tablet Take 1 tablet (20 mEq total) by mouth 2 (two) times daily. After 1 week, change to 1 pill by mouth once daily (Patient taking differently: Take 20 mEq by mouth daily. After 1 week, change to 1 pill by mouth once daily) 40 tablet 5   pregabalin  (LYRICA ) 75 MG capsule Take 1 capsule (75 mg total) by mouth daily. At bedtime 30 capsule 5   rOPINIRole  (REQUIP ) 2 MG tablet Take 2 mg by mouth at bedtime.  3   spironolactone  (ALDACTONE ) 50 MG tablet Take 50 mg by mouth 2 (two) times daily.     TOUJEO  MAX SOLOSTAR 300 UNIT/ML Solostar Pen Inject 40 Units into the skin daily.     valACYclovir (VALTREX) 1000 MG tablet Take 1,000-2,000 mg by mouth as needed (fever blisters).     Vitamin D , Ergocalciferol , (DRISDOL) 1.25 MG (50000 UT) CAPS capsule Take 50,000 Units by mouth once a week.     XIFAXAN 550 MG TABS tablet Take 550 mg by mouth 2 (two) times daily.     No current facility-administered medications for this visit.    HISTORY OF PRESENT ILLNESS:   Oncology History  Iron deficiency anemia    REVIEW OF SYSTEMS:  Review of Systems  Constitutional:  Positive for fatigue. Negative for appetite change, chills, diaphoresis, fever and unexpected weight change.  HENT:  Negative.  Negative for hearing loss, lump/mass, mouth sores, nosebleeds, sore throat, tinnitus, trouble swallowing  and voice change.   Eyes: Negative.  Negative for eye problems and icterus.  Respiratory: Negative.  Negative for chest tightness, cough, hemoptysis, shortness of breath and wheezing.   Cardiovascular: Negative.  Negative for chest pain, leg swelling and palpitations.  Gastrointestinal:  Negative for abdominal distention, abdominal pain, blood in stool, constipation, diarrhea, nausea, rectal pain and vomiting.  Endocrine: Negative.   Genitourinary: Negative.  Negative for bladder incontinence, difficulty urinating, dyspareunia, dysuria, frequency, hematuria, menstrual problem, nocturia, pelvic pain, vaginal bleeding and vaginal discharge.   Musculoskeletal:  Negative for arthralgias, back pain, flank pain, gait problem, myalgias, neck pain and neck stiffness.       Right elbow pain  Skin: Negative.  Negative for itching, rash and wound.  Neurological:  Negative for dizziness, extremity weakness, gait problem, headaches, light-headedness, numbness, seizures and speech difficulty.  Hematological: Negative.  Negative for adenopathy. Does not bruise/bleed easily.  Psychiatric/Behavioral: Negative.  Negative for confusion, decreased concentration, depression, sleep disturbance and suicidal ideas. The patient is not nervous/anxious.    VITALS:  Blood pressure (!) 149/79, pulse 79, temperature (!) 97.5 F (36.4 C), temperature source Oral, resp. rate 18, height 5\' 3"  (1.6 m), weight 166 lb 12.8 oz (75.7 kg), SpO2 100%.  Wt Readings from Last 3 Encounters:  11/08/23 166 lb 12.8 oz (75.7 kg)  08/09/23 168 lb 1.6 oz (76.2 kg)  04/06/23 164 lb 12.8 oz (74.8 kg)    Body mass index is 29.55 kg/m.  Performance status (ECOG): 1 - Symptomatic but completely ambulatory  PHYSICAL EXAM:  Physical Exam Vitals and nursing note reviewed.  Constitutional:      General: She is not  in acute distress.    Appearance: Normal appearance. She is normal weight. She is not ill-appearing, toxic-appearing or  diaphoretic.  HENT:     Head: Normocephalic and atraumatic.     Right Ear: Tympanic membrane, ear canal and external ear normal. There is no impacted cerumen.     Left Ear: Tympanic membrane, ear canal and external ear normal. There is no impacted cerumen.     Nose: Nose normal. No congestion or rhinorrhea.     Mouth/Throat:     Mouth: Mucous membranes are moist.     Pharynx: Oropharynx is clear. No oropharyngeal exudate or posterior oropharyngeal erythema.  Eyes:     General: No scleral icterus.       Right eye: No discharge.        Left eye: No discharge.     Extraocular Movements: Extraocular movements intact.     Conjunctiva/sclera: Conjunctivae normal.     Pupils: Pupils are equal, round, and reactive to light.  Neck:     Vascular: No carotid bruit.  Cardiovascular:     Rate and Rhythm: Normal rate and regular rhythm.     Pulses: Normal pulses.     Heart sounds: Murmur heard.     Systolic murmur is present with a grade of 2/6.     No friction rub. No gallop.  Pulmonary:     Effort: Pulmonary effort is normal. No respiratory distress.     Breath sounds: Normal breath sounds. No stridor. No wheezing, rhonchi or rales.  Chest:     Chest wall: No tenderness.  Abdominal:     General: Bowel sounds are normal. There is no distension.     Palpations: Abdomen is soft. There is no hepatomegaly, splenomegaly or mass.     Tenderness: There is no abdominal tenderness. There is no right CVA tenderness, left CVA tenderness, guarding or rebound.     Hernia: No hernia is present.     Comments: Mild to moderate ascites   Musculoskeletal:        General: No swelling, tenderness, deformity or signs of injury. Normal range of motion.     Cervical back: Normal range of motion and neck supple. No rigidity or tenderness.     Right lower leg: No edema.     Left lower leg: No edema.  Lymphadenopathy:     Cervical: No cervical adenopathy.     Right cervical: No superficial, deep or posterior  cervical adenopathy.    Left cervical: No superficial, deep or posterior cervical adenopathy.     Upper Body:     Right upper body: No supraclavicular, axillary or pectoral adenopathy.     Left upper body: No supraclavicular, axillary or pectoral adenopathy.  Skin:    General: Skin is warm and dry.     Coloration: Skin is not jaundiced or pale.     Findings: No bruising, erythema, lesion or rash.  Neurological:     General: No focal deficit present.     Mental Status: She is alert and oriented to person, place, and time. Mental status is at baseline.     Cranial Nerves: No cranial nerve deficit.     Sensory: No sensory deficit.     Motor: No weakness.     Coordination: Coordination normal.     Gait: Gait normal.     Deep Tendon Reflexes: Reflexes normal.  Psychiatric:        Mood and Affect: Mood normal.  Behavior: Behavior normal.        Thought Content: Thought content normal.        Judgment: Judgment normal.     LABORATORY DATA:  I have reviewed the data as listed    Component Value Date/Time   NA 142 11/08/2023 1328   NA 138 04/06/2023 0000   K 3.3 (L) 11/08/2023 1328   CL 106 11/08/2023 1328   CO2 26 11/08/2023 1328   GLUCOSE 201 (H) 11/08/2023 1328   BUN 17 11/08/2023 1328   BUN 13 04/06/2023 0000   CREATININE 1.14 (H) 11/08/2023 1328   CALCIUM 10.2 11/08/2023 1328   PROT 6.8 11/08/2023 1328   ALBUMIN 4.1 11/08/2023 1328   AST 27 11/08/2023 1328   ALT 15 11/08/2023 1328   ALKPHOS 79 11/08/2023 1328   BILITOT 1.1 11/08/2023 1328   GFRNONAA 50 (L) 11/08/2023 1328   GFRAA NOT CALCULATED 04/18/2011 0607   No results found for: "SPEP", "UPEP"  Lab Results  Component Value Date   WBC 4.3 11/08/2023   NEUTROABS 2.4 11/08/2023   HGB 13.0 11/08/2023   HCT 38.2 11/08/2023   MCV 93.4 11/08/2023   PLT 121 (L) 11/08/2023   RADIOGRAPHIC STUDIES:    I,Jasmine M Lassiter,acting as a scribe for Nolia Baumgartner, MD.,have documented all relevant  documentation on the behalf of Nolia Baumgartner, MD,as directed by  Nolia Baumgartner, MD while in the presence of Nolia Baumgartner, MD.

## 2023-11-09 LAB — AFP TUMOR MARKER: AFP, Serum, Tumor Marker: 4.3 ng/mL (ref 0.0–9.2)

## 2023-11-10 ENCOUNTER — Telehealth: Payer: Self-pay

## 2023-11-10 NOTE — Telephone Encounter (Signed)
-----   Message from Nolia Baumgartner sent at 11/09/2023  7:23 PM EDT ----- Regarding: call Tell her she was right, the serum ammonia level is elevated and she needs to increase her lactulose .  How much is she taking now?

## 2023-11-10 NOTE — Telephone Encounter (Signed)
 Patient notified of message. Per patient, she is taking Lactulose  1/4 cup twice a day. Informed her that Dr. Almer Jacobson would like for her to take her Lactulose  three times a day with and extra dose today. Patient notified and voiced her understanding. Also per Dr. Almer Jacobson, patient needs to follow up in May. Message to scheduling will be sent.     Scheduling please get patient scheduled for follow up in May per Dr. Almer Jacobson.

## 2023-11-16 DIAGNOSIS — S46211A Strain of muscle, fascia and tendon of other parts of biceps, right arm, initial encounter: Secondary | ICD-10-CM | POA: Diagnosis not present

## 2023-11-16 DIAGNOSIS — S52021G Displaced fracture of olecranon process without intraarticular extension of right ulna, subsequent encounter for closed fracture with delayed healing: Secondary | ICD-10-CM | POA: Diagnosis not present

## 2023-11-20 ENCOUNTER — Encounter: Payer: Self-pay | Admitting: Oncology

## 2023-11-20 ENCOUNTER — Other Ambulatory Visit: Payer: Self-pay | Admitting: Oncology

## 2023-11-20 DIAGNOSIS — K7682 Hepatic encephalopathy: Secondary | ICD-10-CM

## 2023-11-20 DIAGNOSIS — K7460 Unspecified cirrhosis of liver: Secondary | ICD-10-CM

## 2023-11-21 ENCOUNTER — Encounter: Payer: Self-pay | Admitting: Oncology

## 2023-11-21 DIAGNOSIS — H2513 Age-related nuclear cataract, bilateral: Secondary | ICD-10-CM | POA: Diagnosis not present

## 2023-11-28 DIAGNOSIS — M25511 Pain in right shoulder: Secondary | ICD-10-CM | POA: Diagnosis not present

## 2023-11-28 DIAGNOSIS — M25621 Stiffness of right elbow, not elsewhere classified: Secondary | ICD-10-CM | POA: Diagnosis not present

## 2023-11-28 DIAGNOSIS — M25611 Stiffness of right shoulder, not elsewhere classified: Secondary | ICD-10-CM | POA: Diagnosis not present

## 2023-11-28 DIAGNOSIS — M25521 Pain in right elbow: Secondary | ICD-10-CM | POA: Diagnosis not present

## 2023-11-28 DIAGNOSIS — M542 Cervicalgia: Secondary | ICD-10-CM | POA: Diagnosis not present

## 2023-11-29 NOTE — Progress Notes (Signed)
 Patient Care Team: Adrian Hopper, MD as PCP - General (Internal Medicine) Jerryl Morin, DO as PCP - Cardiology (Cardiology) Nolia Baumgartner, MD as Consulting Physician (Oncology) Carmelina Chinchilla, MD as Referring Physician (Gastroenterology)  Clinic Day:11/30/23   Referring physician: Adrian Hopper, MD  ASSESSMENT & PLAN:  Assessment: 1. Iron deficiency anemia in the past. Multiple AVM's of the small bowel seen on capsule endoscopy in [facility], treated at [facility] in February 2021. Now resolved since Dr. Sibyl Drafts at [facility] did cauterize her AVM's. She was given IV iron in April 2023, and has not required any since then. Her hemoglobin dropped from 13.0 to 12.3 but I think she may have been a little dehydrated last time.    2. Non-alcoholic liver cirrhosis, with intermittent mild thrombocytopenia. Her total bilirubin did go up slightly today at 1.5.    3. Ascites, improved and stable, for which she is on spironolactone  50mg  BID and furosemide  40mg  daily.   4. Hepatic encephalopathy.  Her serum ammonia was elevated at 44 3 weeks ago. She has required 3 admission into the hospital last year with this diagnosis and her serum ammonia was 145. I have stressed that she needs to continue her current dose of Lactulose  45mL to 30mL three times daily as much as possible in order to keep this under control. The Xifaxan is helping.   5. Thrombocytopenia, mild but worsening.   6. Hypokalemia. I have recommended she increase her supplement from one daily to two daily. This has corrected on supplement of 2 daily and will need to be monitored closely,   Plan: She was seen seen 2 weeks ago and noted feeling poorly, the serum ammonia came back at 44 and so we encouraged her to increase the lactulose  back to TID. She continues Xifaxan 550 mg BID and lactulose  BID down from TID for the last 2 days as that was too strong for her, causing liquid stools. She has had several  deaths of her friends and is going to a funeral this morning. She has a WBC of 5.4, hemoglobin of 12.3 down from 13.0, and platelet count of 97,000 down from 121,000. Her CMP is fairly normal other than an elevated total bilirubin of 1.5, glucose of 172, and low total protein of 6.3. I advised her to avoid alcohol and tylenol . Her serum ammonia today is pending and I will call her back with the results. Dr. Ileana Mallard will see her in June and recheck her labs then. I will see her back in mid July with CBC, CMP, and serum ammonia. The patient understands the plans discussed today and is in agreement with them.  She knows to contact our office if she develops concerns prior to her next appointment.  I provided 12 minutes of face-to-face time during this this encounter and > 50% was spent counseling as documented under my assessment and plan.   Nolia Baumgartner, MD  [facility] DE[facility] OF [facility] [address] [place] [address] Dept: [phone] Dept Fax: [phone]   No orders of the defined types were placed in this encounter.   CHIEF COMPLAINT:  CC: history of iron deficiency anemia  Current Treatment:  Surveillance   HISTORY OF PRESENT ILLNESS:  [name] is a 73 y.o. female  who we began seeing in December 2019 for iron deficiency anemia. In November 2018, colonoscopy revealed tubular adenomas.  Upper  endoscopy in January 2018 did not reveal any specific findings.  She was clearly iron deficient.  She has multiple comorbidities, including diabetes, liver cirrhosis, COPD, hyperlipidemia, and degenerative disc disease.  Testing for any other deficiency or monoclonal spike was negative. She had already been on oral supplement for nearly 1 year with lack of response, so was given IV iron in the form of Feraheme  on several occasions. Repeat colonoscopy and EGD were done in August 2020 by Dr. Jonita Neth.  Biopsy was negative.   When we saw her later in September, the hemoglobin had dropped down to 7.5.  She was transfused with 2 units of packed red blood cells.  Repeat iron studies once again revealed iron deficiency, so was given IV Feraheme .  CT abdomen and pelvis was negative.  She felt so much better after the transfusion that she jumped out of bed and fractured her right foot in a fall.  She was given IV Feraheme  again in November.  Capsule endoscopy in November 2020 in [facility] revealed multiple AVM's of the small bowel.  She had cauterization of an AVM of the small bowel at [facility] in February 2021. In March 2021, her hemoglobin dropped back down 7.6.  She therefore received IV Feraheme  again in early April.  Abdominal ultrasound in May revealed cirrhosis of the liver with nodular surface, increased echogenicity and heterogeneity.  No focal liver parenchymal lesion was observed.  She has had intermittent mild thrombocytopenia, presumably from her liver cirrhosis. She received IV Feraheme  in June 2021, and received 2 units of packed red blood cells at that time. She was admitted to [facility] later in June with generalized weakness, altered mental status, and UTI.  While out of town, she developed severe epistaxis that required packing and 1 unit of packed red blood cells.  CT imaging revealed small amount of blood in the right maxillary and sphenoid sinuses with tube placed in the right paranasal sinus.     She developed a severe epistaxis requiring hospital admission in late July 2021 and had a nasal bullet in place.  Her hemoglobin dropped down to 6.9, she was transfused, and given a dose of IV Feraheme  while in the hospital.  She had an episode of hypotension and fever and was felt to be septic, so was transferred to the ICU.  She was placed on broad-spectrum IV antibiotics, and cultures remained negative.  Echocardiogram showed a suspicious mass in the right atrium, which may be attached to the interatrial septum.  The  cardiologist reviewed the echocardiogram and felt that this was likely an artifact and not a mass, vegetation or thrombus. Her hemoglobin was 8.7 at the time of discharge, and she  was seen in August with weakness of her hands,shakiness, dropping objects, fatigue, dyspnea with exertion and moderate abdominal swelling.  She had asterixis, and ammonia level was elevated at 62.  She was therefore placed on lactulose  30 mL twice a day. She was seen a week later with some improvement, but her serum ammonia was still 54, so the lactulose  was increased to TID.  She also had hyperkalemia, so her spironolactone  25 mg was held.  She was transfused with 2 units of PRBCs again at the end of September when her hemoglobin dropped down to 7.3.  Iron level was 13.0 with a TIBC of 415 for a saturation of 3.1%. She was scheduled for 2 more doses of IV Feraheme .  She was admitted in October  with a fever of 105 and  had E coli sepsis. Lactulose  was placed on hold due to severe diarrhea, adding to her dehydration. Her serum ammonia came down to 9, so we did not resume lactulose .  She did have a new splenic infarction as of this admission, which was causing a lot of left abdominal pain.  Her spironolactone  was increased to 50 mg to take 1 to 2 daily.  At her visit in November, she was placed back on lactulose  daily.   When she was seen in early February 2022, her CBC reveals a hemoglobin of 7.6 with normal ANC and platelets.  She was therefore transfused 2 units of PRBCs. Iron studies revealed recurrent iron deficiency.. Ammonia level was elevated at 35,  so her lactulose  was increased to 30 cc twice daily.  She continued to have blood loss with black stools and one episode of moderate epistaxis,  She saw the gastroenterologist at [facility] on May 27th, Dr. Magda Schneider, and had an EGD with plasma coagulation to cauterize two AVM's.  He has now recommended observation and will see her back if her hemoglobin drops again, and we document  iron deficiency.  She last received IV iron in July/August 2022. She was seen last month, November 2022, for worsening symptoms and was found to have increased ascites. I recommended increase of spironolactone  to 50 mg BID. Her hemoglobin was stable and serum ammonia was normal.    INTERVAL HISTORY:  [name] is here today for a added on appointment due to an increase in her serum ammonia levels. She was seen seen 2 weeks ago and noted feeling poorly, the serum ammonia came back at 44 and so we encouraged her to increased the lactulose  back to TID. She continues Xifaxan 550 mg BID and lactulose  BID down from TID for the last 2 days as that was too strong for her, causing liquid stools. Patient states that she feels better than last time but complains of fatigue and bicep/right arm pain. She has had several deaths of her friends and is going to a funeral this morning. She has a WBC of 5.4, hemoglobin of 12.3 down from 13.0, and platelet count of 97,000 down from 121,000. Her CMP is fairly normal other than an elevated total bilirubin of 1.5, glucose of 172, and low total protein of 6.3. I advised her to avoid alcohol and tylenol . Her serum ammonia today is pending and I will call her back with the results. I will see her back in mid July with CBC, CMP, and serum ammonia.  She denies fever, chills, night sweats, or other signs of infection. She denies cardiorespiratory. Her appetite is good and her weight has increased 3 pounds over last 3 weeks.     I have reviewed the past medical history, past surgical history, social history and family history with the patient and they are unchanged from previous note.  ALLERGIES:  has no known allergies.  MEDICATIONS:  Current Outpatient Medications  Medication Sig Dispense Refill   furosemide  (LASIX ) 40 MG tablet Take 40 mg by mouth daily.     insulin  aspart (NOVOLOG ) 100 UNIT/ML FlexPen Inject 30 Units into the skin 2 (two) times daily.     lactulose  (CHRONULAC ) 10  GM/15ML solution TAKE 30 MLS (20 G TOTAL) BY MOUTH 3 (THREE) TIMES DAILY. 8514 mL 1   ondansetron  (ZOFRAN ) 4 MG tablet Take 1 tablet (4 mg total) by mouth every 4 (four) hours as needed for nausea. 60 tablet 3   OZEMPIC, 1 MG/DOSE, 4 MG/3ML SOPN  Inject 1 mg into the skin once a week.     potassium chloride  SA (KLOR-CON  M) 20 MEQ tablet Take 1 tablet (20 mEq total) by mouth 2 (two) times daily. After 1 week, change to 1 pill by mouth once daily (Patient taking differently: Take 20 mEq by mouth daily. After 1 week, change to 1 pill by mouth once daily) 40 tablet 5   pregabalin  (LYRICA ) 75 MG capsule Take 1 capsule (75 mg total) by mouth daily. At bedtime 30 capsule 5   rOPINIRole  (REQUIP ) 2 MG tablet Take 2 mg by mouth at bedtime.  3   spironolactone  (ALDACTONE ) 50 MG tablet Take 50 mg by mouth 2 (two) times daily.     TOUJEO  MAX SOLOSTAR 300 UNIT/ML Solostar Pen Inject 40 Units into the skin daily.     Vitamin D , Ergocalciferol , (DRISDOL) 1.25 MG (50000 UT) CAPS capsule Take 50,000 Units by mouth once a week.     XIFAXAN 550 MG TABS tablet Take 550 mg by mouth 2 (two) times daily.     No current facility-administered medications for this visit.   REVIEW OF SYSTEMS:  Review of Systems  Constitutional:  Positive for fatigue. Negative for appetite change, chills, diaphoresis, fever and unexpected weight change.  HENT:  Negative.  Negative for hearing loss, lump/mass, mouth sores, nosebleeds, sore throat, tinnitus, trouble swallowing and voice change.   Eyes: Negative.  Negative for eye problems and icterus.  Respiratory: Negative.  Negative for chest tightness, cough, hemoptysis, shortness of breath and wheezing.   Cardiovascular: Negative.  Negative for chest pain, leg swelling and palpitations.  Gastrointestinal:  Negative for abdominal distention, abdominal pain, blood in stool, constipation, diarrhea, nausea, rectal pain and vomiting.  Endocrine: Negative.   Genitourinary: Negative.  Negative  for bladder incontinence, difficulty urinating, dyspareunia, dysuria, frequency, hematuria, menstrual problem, nocturia, pelvic pain, vaginal bleeding and vaginal discharge.   Musculoskeletal:  Negative for arthralgias, back pain, flank pain, gait problem, myalgias, neck pain and neck stiffness.       Right bicep/arm pain  Skin: Negative.  Negative for itching, rash and wound.  Neurological:  Negative for dizziness, extremity weakness, gait problem, headaches, light-headedness, numbness, seizures and speech difficulty.  Hematological: Negative.  Negative for adenopathy. Does not bruise/bleed easily.  Psychiatric/Behavioral: Negative.  Negative for confusion, decreased concentration, depression, sleep disturbance and suicidal ideas. The patient is not nervous/anxious.    VITALS:  Blood pressure (!) 150/68, pulse 83, temperature 98.2 F (36.8 C), temperature source Oral, resp. rate 16, height 5\' 3"  (1.6 m), weight 169 lb 1.6 oz (76.7 kg), SpO2 99%.  Wt Readings from Last 3 Encounters:  11/30/23 169 lb 1.6 oz (76.7 kg)  11/08/23 166 lb 12.8 oz (75.7 kg)  08/09/23 168 lb 1.6 oz (76.2 kg)    Body mass index is 29.95 kg/m.  Performance status (ECOG): 1 - Symptomatic but completely ambulatory  PHYSICAL EXAM:  Physical Exam Vitals and nursing note reviewed.  Constitutional:      General: She is not in acute distress.    Appearance: Normal appearance. She is normal weight. She is not ill-appearing, toxic-appearing or diaphoretic.  HENT:     Head: Normocephalic and atraumatic.     Right Ear: Tympanic membrane, ear canal and external ear normal. There is no impacted cerumen.     Left Ear: Tympanic membrane, ear canal and external ear normal. There is no impacted cerumen.     Nose: Nose normal. No congestion or rhinorrhea.  Mouth/Throat:     Mouth: Mucous membranes are moist.     Pharynx: Oropharynx is clear. No oropharyngeal exudate or posterior oropharyngeal erythema.  Eyes:     General:  No scleral icterus.       Right eye: No discharge.        Left eye: No discharge.     Extraocular Movements: Extraocular movements intact.     Conjunctiva/sclera: Conjunctivae normal.     Pupils: Pupils are equal, round, and reactive to light.  Neck:     Vascular: No carotid bruit.  Cardiovascular:     Rate and Rhythm: Normal rate and regular rhythm.     Pulses: Normal pulses.     Heart sounds: Murmur heard.     Systolic murmur is present with a grade of 2/6.     No friction rub. No gallop.  Pulmonary:     Effort: Pulmonary effort is normal. No respiratory distress.     Breath sounds: Normal breath sounds. No stridor. No wheezing, rhonchi or rales.  Chest:     Chest wall: No tenderness.  Abdominal:     General: Bowel sounds are normal. There is no distension.     Palpations: Abdomen is soft. There is hepatomegaly (mild) and splenomegaly (mild). There is no mass.     Tenderness: There is no abdominal tenderness. There is no right CVA tenderness, left CVA tenderness, guarding or rebound.     Hernia: No hernia is present.     Comments: Mild ascites   Musculoskeletal:        General: No swelling, tenderness, deformity or signs of injury. Normal range of motion.     Cervical back: Normal range of motion and neck supple. No rigidity or tenderness.     Right lower leg: No edema.     Left lower leg: No edema.  Lymphadenopathy:     Cervical: No cervical adenopathy.     Right cervical: No superficial, deep or posterior cervical adenopathy.    Left cervical: No superficial, deep or posterior cervical adenopathy.     Upper Body:     Right upper body: No supraclavicular, axillary or pectoral adenopathy.     Left upper body: No supraclavicular, axillary or pectoral adenopathy.  Skin:    General: Skin is warm and dry.     Coloration: Skin is not jaundiced or pale.     Findings: No bruising, erythema, lesion or rash.  Neurological:     General: No focal deficit present.     Mental Status:  She is alert and oriented to person, place, and time. Mental status is at baseline.     Cranial Nerves: No cranial nerve deficit.     Sensory: No sensory deficit.     Motor: No weakness.     Coordination: Coordination normal.     Gait: Gait normal.     Deep Tendon Reflexes: Reflexes normal.  Psychiatric:        Mood and Affect: Mood normal.        Behavior: Behavior normal.        Thought Content: Thought content normal.        Judgment: Judgment normal.     LABORATORY DATA:  I have reviewed the data as listed    Component Value Date/Time   NA 139 11/30/2023 0837   NA 138 04/06/2023 0000   K 4.1 11/30/2023 0837   CL 108 11/30/2023 0837   CO2 21 (L) 11/30/2023 0837   GLUCOSE 172 (H)  11/30/2023 0837   BUN 13 11/30/2023 0837   BUN 13 04/06/2023 0000   CREATININE 0.99 11/30/2023 0837   CALCIUM 10.2 11/30/2023 0837   PROT 6.3 (L) 11/30/2023 0837   ALBUMIN 3.6 11/30/2023 0837   AST 31 11/30/2023 0837   ALT 17 11/30/2023 0837   ALKPHOS 73 11/30/2023 0837   BILITOT 1.5 (H) 11/30/2023 0837   GFRNONAA 60 (L) 11/30/2023 0837   GFRAA NOT CALCULATED 04/18/2011 0607   No results found for: "SPEP", "UPEP"  Lab Results  Component Value Date   WBC 5.4 11/30/2023   NEUTROABS 3.2 11/30/2023   HGB 12.3 11/30/2023   HCT 35.9 (L) 11/30/2023   MCV 93.2 11/30/2023   PLT 97 (L) 11/30/2023   RADIOGRAPHIC STUDIES:    I,Jasmine M Lassiter,acting as a scribe for Nolia Baumgartner, MD.,have documented all relevant documentation on the behalf of Nolia Baumgartner, MD,as directed by  Nolia Baumgartner, MD while in the presence of Nolia Baumgartner, MD.

## 2023-11-30 ENCOUNTER — Inpatient Hospital Stay (HOSPITAL_BASED_OUTPATIENT_CLINIC_OR_DEPARTMENT_OTHER): Admitting: Oncology

## 2023-11-30 ENCOUNTER — Inpatient Hospital Stay: Attending: Oncology

## 2023-11-30 ENCOUNTER — Encounter: Payer: Self-pay | Admitting: Oncology

## 2023-11-30 ENCOUNTER — Other Ambulatory Visit: Payer: Self-pay

## 2023-11-30 VITALS — BP 150/68 | HR 83 | Temp 98.2°F | Resp 16 | Ht 63.0 in | Wt 169.1 lb

## 2023-11-30 DIAGNOSIS — D696 Thrombocytopenia, unspecified: Secondary | ICD-10-CM | POA: Insufficient documentation

## 2023-11-30 DIAGNOSIS — K7682 Hepatic encephalopathy: Secondary | ICD-10-CM

## 2023-11-30 DIAGNOSIS — Z862 Personal history of diseases of the blood and blood-forming organs and certain disorders involving the immune mechanism: Secondary | ICD-10-CM | POA: Diagnosis not present

## 2023-11-30 DIAGNOSIS — K7469 Other cirrhosis of liver: Secondary | ICD-10-CM

## 2023-11-30 DIAGNOSIS — E876 Hypokalemia: Secondary | ICD-10-CM | POA: Diagnosis not present

## 2023-11-30 DIAGNOSIS — R188 Other ascites: Secondary | ICD-10-CM

## 2023-11-30 DIAGNOSIS — K7460 Unspecified cirrhosis of liver: Secondary | ICD-10-CM

## 2023-11-30 LAB — CMP (CANCER CENTER ONLY)
ALT: 17 U/L (ref 0–44)
AST: 31 U/L (ref 15–41)
Albumin: 3.6 g/dL (ref 3.5–5.0)
Alkaline Phosphatase: 73 U/L (ref 38–126)
Anion gap: 11 (ref 5–15)
BUN: 13 mg/dL (ref 8–23)
CO2: 21 mmol/L — ABNORMAL LOW (ref 22–32)
Calcium: 10.2 mg/dL (ref 8.9–10.3)
Chloride: 108 mmol/L (ref 98–111)
Creatinine: 0.99 mg/dL (ref 0.44–1.00)
GFR, Estimated: 60 mL/min — ABNORMAL LOW (ref >60)
Glucose, Bld: 172 mg/dL — ABNORMAL HIGH (ref 70–99)
Potassium: 4.1 mmol/L (ref 3.5–5.1)
Sodium: 139 mmol/L (ref 135–145)
Total Bilirubin: 1.5 mg/dL — ABNORMAL HIGH (ref 0.0–1.2)
Total Protein: 6.3 g/dL — ABNORMAL LOW (ref 6.5–8.1)

## 2023-11-30 LAB — CBC WITH DIFFERENTIAL (CANCER CENTER ONLY)
Abs Immature Granulocytes: 0.02 10*3/uL (ref 0.00–0.07)
Basophils Absolute: 0 10*3/uL (ref 0.0–0.1)
Basophils Relative: 1 %
Eosinophils Absolute: 0.4 10*3/uL (ref 0.0–0.5)
Eosinophils Relative: 7 %
HCT: 35.9 % — ABNORMAL LOW (ref 36.0–46.0)
Hemoglobin: 12.3 g/dL (ref 12.0–15.0)
Immature Granulocytes: 0 %
Immature Platelet Fraction: 3.5 % (ref 1.2–8.6)
Lymphocytes Relative: 26 %
Lymphs Abs: 1.4 10*3/uL (ref 0.7–4.0)
MCH: 31.9 pg (ref 26.0–34.0)
MCHC: 34.3 g/dL (ref 30.0–36.0)
MCV: 93.2 fL (ref 80.0–100.0)
Monocytes Absolute: 0.4 10*3/uL (ref 0.1–1.0)
Monocytes Relative: 8 %
Neutro Abs: 3.2 10*3/uL (ref 1.7–7.7)
Neutrophils Relative %: 58 %
Platelet Count: 97 10*3/uL — ABNORMAL LOW (ref 150–400)
RBC: 3.85 MIL/uL — ABNORMAL LOW (ref 3.87–5.11)
RDW: 14.1 % (ref 11.5–15.5)
WBC Count: 5.4 10*3/uL (ref 4.0–10.5)
nRBC: 0 % (ref 0.0–0.2)

## 2023-11-30 LAB — AMMONIA: Ammonia: 21 umol/L (ref 9–35)

## 2023-11-30 NOTE — Progress Notes (Signed)
amm

## 2023-12-01 ENCOUNTER — Telehealth: Payer: Self-pay

## 2023-12-01 NOTE — Telephone Encounter (Signed)
 Called patient and notified her of the message. Patient voiced her understanding.

## 2023-12-01 NOTE — Telephone Encounter (Signed)
-----   Message from Nolia Baumgartner sent at 12/01/2023  2:13 PM EDT ----- Regarding: call Tell her serum ammonia is much better at 21, normal.  So she is okay to take the lactulose  bid

## 2023-12-04 DIAGNOSIS — M25521 Pain in right elbow: Secondary | ICD-10-CM | POA: Diagnosis not present

## 2023-12-04 DIAGNOSIS — M25511 Pain in right shoulder: Secondary | ICD-10-CM | POA: Diagnosis not present

## 2023-12-04 DIAGNOSIS — M25611 Stiffness of right shoulder, not elsewhere classified: Secondary | ICD-10-CM | POA: Diagnosis not present

## 2023-12-04 DIAGNOSIS — M542 Cervicalgia: Secondary | ICD-10-CM | POA: Diagnosis not present

## 2023-12-04 DIAGNOSIS — M25621 Stiffness of right elbow, not elsewhere classified: Secondary | ICD-10-CM | POA: Diagnosis not present

## 2023-12-06 DIAGNOSIS — M25621 Stiffness of right elbow, not elsewhere classified: Secondary | ICD-10-CM | POA: Diagnosis not present

## 2023-12-06 DIAGNOSIS — M542 Cervicalgia: Secondary | ICD-10-CM | POA: Diagnosis not present

## 2023-12-06 DIAGNOSIS — M25511 Pain in right shoulder: Secondary | ICD-10-CM | POA: Diagnosis not present

## 2023-12-06 DIAGNOSIS — M25611 Stiffness of right shoulder, not elsewhere classified: Secondary | ICD-10-CM | POA: Diagnosis not present

## 2023-12-06 DIAGNOSIS — M25521 Pain in right elbow: Secondary | ICD-10-CM | POA: Diagnosis not present

## 2023-12-09 ENCOUNTER — Encounter: Payer: Self-pay | Admitting: Oncology

## 2023-12-11 DIAGNOSIS — M25621 Stiffness of right elbow, not elsewhere classified: Secondary | ICD-10-CM | POA: Diagnosis not present

## 2023-12-11 DIAGNOSIS — M542 Cervicalgia: Secondary | ICD-10-CM | POA: Diagnosis not present

## 2023-12-11 DIAGNOSIS — M25521 Pain in right elbow: Secondary | ICD-10-CM | POA: Diagnosis not present

## 2023-12-11 DIAGNOSIS — M25611 Stiffness of right shoulder, not elsewhere classified: Secondary | ICD-10-CM | POA: Diagnosis not present

## 2023-12-11 DIAGNOSIS — M25511 Pain in right shoulder: Secondary | ICD-10-CM | POA: Diagnosis not present

## 2023-12-13 DIAGNOSIS — M25611 Stiffness of right shoulder, not elsewhere classified: Secondary | ICD-10-CM | POA: Diagnosis not present

## 2023-12-13 DIAGNOSIS — M25621 Stiffness of right elbow, not elsewhere classified: Secondary | ICD-10-CM | POA: Diagnosis not present

## 2023-12-13 DIAGNOSIS — M25511 Pain in right shoulder: Secondary | ICD-10-CM | POA: Diagnosis not present

## 2023-12-13 DIAGNOSIS — M542 Cervicalgia: Secondary | ICD-10-CM | POA: Diagnosis not present

## 2023-12-13 DIAGNOSIS — M25521 Pain in right elbow: Secondary | ICD-10-CM | POA: Diagnosis not present

## 2023-12-20 DIAGNOSIS — M25521 Pain in right elbow: Secondary | ICD-10-CM | POA: Diagnosis not present

## 2023-12-20 DIAGNOSIS — M25611 Stiffness of right shoulder, not elsewhere classified: Secondary | ICD-10-CM | POA: Diagnosis not present

## 2023-12-20 DIAGNOSIS — M25511 Pain in right shoulder: Secondary | ICD-10-CM | POA: Diagnosis not present

## 2023-12-20 DIAGNOSIS — M542 Cervicalgia: Secondary | ICD-10-CM | POA: Diagnosis not present

## 2023-12-20 DIAGNOSIS — M25621 Stiffness of right elbow, not elsewhere classified: Secondary | ICD-10-CM | POA: Diagnosis not present

## 2023-12-21 DIAGNOSIS — L820 Inflamed seborrheic keratosis: Secondary | ICD-10-CM | POA: Diagnosis not present

## 2023-12-21 DIAGNOSIS — L570 Actinic keratosis: Secondary | ICD-10-CM | POA: Diagnosis not present

## 2023-12-21 DIAGNOSIS — D485 Neoplasm of uncertain behavior of skin: Secondary | ICD-10-CM | POA: Diagnosis not present

## 2023-12-21 DIAGNOSIS — D0439 Carcinoma in situ of skin of other parts of face: Secondary | ICD-10-CM | POA: Diagnosis not present

## 2023-12-21 DIAGNOSIS — L821 Other seborrheic keratosis: Secondary | ICD-10-CM | POA: Diagnosis not present

## 2023-12-22 DIAGNOSIS — M25521 Pain in right elbow: Secondary | ICD-10-CM | POA: Diagnosis not present

## 2023-12-22 DIAGNOSIS — M25511 Pain in right shoulder: Secondary | ICD-10-CM | POA: Diagnosis not present

## 2023-12-22 DIAGNOSIS — M25621 Stiffness of right elbow, not elsewhere classified: Secondary | ICD-10-CM | POA: Diagnosis not present

## 2023-12-22 DIAGNOSIS — M542 Cervicalgia: Secondary | ICD-10-CM | POA: Diagnosis not present

## 2023-12-22 DIAGNOSIS — M25611 Stiffness of right shoulder, not elsewhere classified: Secondary | ICD-10-CM | POA: Diagnosis not present

## 2023-12-25 DIAGNOSIS — M542 Cervicalgia: Secondary | ICD-10-CM | POA: Diagnosis not present

## 2023-12-25 DIAGNOSIS — M25511 Pain in right shoulder: Secondary | ICD-10-CM | POA: Diagnosis not present

## 2023-12-25 DIAGNOSIS — M25621 Stiffness of right elbow, not elsewhere classified: Secondary | ICD-10-CM | POA: Diagnosis not present

## 2023-12-25 DIAGNOSIS — M25611 Stiffness of right shoulder, not elsewhere classified: Secondary | ICD-10-CM | POA: Diagnosis not present

## 2023-12-25 DIAGNOSIS — M25521 Pain in right elbow: Secondary | ICD-10-CM | POA: Diagnosis not present

## 2023-12-27 DIAGNOSIS — M542 Cervicalgia: Secondary | ICD-10-CM | POA: Diagnosis not present

## 2023-12-27 DIAGNOSIS — M25521 Pain in right elbow: Secondary | ICD-10-CM | POA: Diagnosis not present

## 2023-12-27 DIAGNOSIS — M25621 Stiffness of right elbow, not elsewhere classified: Secondary | ICD-10-CM | POA: Diagnosis not present

## 2023-12-27 DIAGNOSIS — M25511 Pain in right shoulder: Secondary | ICD-10-CM | POA: Diagnosis not present

## 2023-12-27 DIAGNOSIS — M25611 Stiffness of right shoulder, not elsewhere classified: Secondary | ICD-10-CM | POA: Diagnosis not present

## 2023-12-28 DIAGNOSIS — M810 Age-related osteoporosis without current pathological fracture: Secondary | ICD-10-CM | POA: Diagnosis not present

## 2023-12-28 DIAGNOSIS — E1129 Type 2 diabetes mellitus with other diabetic kidney complication: Secondary | ICD-10-CM | POA: Diagnosis not present

## 2023-12-28 DIAGNOSIS — E039 Hypothyroidism, unspecified: Secondary | ICD-10-CM | POA: Diagnosis not present

## 2023-12-28 DIAGNOSIS — E785 Hyperlipidemia, unspecified: Secondary | ICD-10-CM | POA: Diagnosis not present

## 2023-12-28 DIAGNOSIS — M7541 Impingement syndrome of right shoulder: Secondary | ICD-10-CM | POA: Diagnosis not present

## 2023-12-28 DIAGNOSIS — E559 Vitamin D deficiency, unspecified: Secondary | ICD-10-CM | POA: Diagnosis not present

## 2023-12-28 DIAGNOSIS — R809 Proteinuria, unspecified: Secondary | ICD-10-CM | POA: Diagnosis not present

## 2023-12-28 DIAGNOSIS — D509 Iron deficiency anemia, unspecified: Secondary | ICD-10-CM | POA: Diagnosis not present

## 2023-12-28 DIAGNOSIS — I10 Essential (primary) hypertension: Secondary | ICD-10-CM | POA: Diagnosis not present

## 2023-12-28 DIAGNOSIS — K7581 Nonalcoholic steatohepatitis (NASH): Secondary | ICD-10-CM | POA: Diagnosis not present

## 2023-12-28 DIAGNOSIS — K7460 Unspecified cirrhosis of liver: Secondary | ICD-10-CM | POA: Diagnosis not present

## 2023-12-28 DIAGNOSIS — S52021G Displaced fracture of olecranon process without intraarticular extension of right ulna, subsequent encounter for closed fracture with delayed healing: Secondary | ICD-10-CM | POA: Diagnosis not present

## 2024-01-01 DIAGNOSIS — M25511 Pain in right shoulder: Secondary | ICD-10-CM | POA: Diagnosis not present

## 2024-01-01 DIAGNOSIS — M25521 Pain in right elbow: Secondary | ICD-10-CM | POA: Diagnosis not present

## 2024-01-01 DIAGNOSIS — M25621 Stiffness of right elbow, not elsewhere classified: Secondary | ICD-10-CM | POA: Diagnosis not present

## 2024-01-01 DIAGNOSIS — M25611 Stiffness of right shoulder, not elsewhere classified: Secondary | ICD-10-CM | POA: Diagnosis not present

## 2024-01-01 DIAGNOSIS — M542 Cervicalgia: Secondary | ICD-10-CM | POA: Diagnosis not present

## 2024-02-02 NOTE — Progress Notes (Addendum)
 ADDENDUM: Her serum ammonia was 30 today, so normal.   Patient Care Team: Keren Vicenta BRAVO, MD as PCP - General (Internal Medicine) Sheena Pugh, DO as PCP - Cardiology (Cardiology) Cornelius Wanda DEL, MD as Consulting Physician (Oncology) Russella Missy Bers, MD as Referring Physician (Gastroenterology)  Clinic Day: 02/08/2024  Referring physician: Keren Vicenta BRAVO, MD  ASSESSMENT & PLAN:  Assessment: 1. Iron deficiency anemia in the past. Multiple AVM's of the small bowel seen on capsule endoscopy in [facility], treated at [facility] in February 2021. Now resolved since Dr. Russella at [facility] did cauterize her AVM's. She was given IV iron in April 2023, and has not required any since then. Her hemoglobin dropped from 13.0 to 12.3 but I think she may have been a little dehydrated last time.    2. Non-alcoholic liver cirrhosis, with intermittent mild thrombocytopenia. Her total bilirubin did go up slightly today at 1.5.    3. Ascites, improved and stable, for which she is on spironolactone  50 mg BID and furosemide  40 mg daily.   4. Hepatic encephalopathy.  Her serum ammonia was elevated at 44 three months ago. She has required 3 admission into the hospital last year with this diagnosis and her serum ammonia was 145. I have stressed that she needs to continue her current dose of Lactulose  45mL to 30mL three times daily as much as possible in order to keep this under control. The Xifaxan is helping.   5. Thrombocytopenia, mild but worsening.   6. Hypokalemia. I have recommended she increase her supplement from one daily to two daily. This has corrected on supplement of 2 daily and will need to be monitored closely,   Plan:  She is having a lot of stress with her significant other and his multiple medical problems. She has also been experiencing dysuria and frequency. A urine culture was not done however, a urinalysis was done which appeared normal. She has a bone  density scan and mammogram scheduled on August, 1st. She informed me that she missed a few days of Lactulose  but has otherwise been compliant. She has a WBC of 7.4, hemoglobin of 13.3, and platelet count of 159,000, improved from 97,000. Her CMP is normal other than a elevated creatinine of 1.16. Her serum ammonia is pending and I will call her with the results. She has an appointment with Dr. Keren in September and I will see her back in 3 months with CBC, CMP, and serum ammonia. The patient understands the plans discussed today and is in agreement with them.  She knows to contact our office if she develops concerns prior to her next appointment.  I provided 16 minutes of face-to-face time during this this encounter and > 50% was spent counseling as documented under my assessment and plan.   [name], MD  [facility] - A DEPT OF [facility] [address] [place] [address] Dept: [phone] Dept Fax: [phone]   Orders Placed This Encounter  Procedures   Urinalysis, Complete w Microscopic    Standing Status:   Future    Number of Occurrences:   1    Expiration Date:   02/07/2025    CHIEF COMPLAINT:  CC: history of iron deficiency anemia  Current Treatment:  Surveillance   HISTORY OF PRESENT ILLNESS:  [name] is a 73 y.o. female  who we began seeing in December 2019 for iron deficiency anemia.  In November 2018, colonoscopy revealed tubular adenomas.  Upper endoscopy in January 2018 did not reveal any specific findings.  She was clearly iron deficient.  She has multiple comorbidities, including diabetes, liver cirrhosis, COPD, hyperlipidemia, and degenerative disc disease.  Testing for any other deficiency or monoclonal spike was negative. She had already been on oral supplement for nearly 1 year with lack of response, so was given IV iron in the form of Feraheme  on several occasions. Repeat colonoscopy and  EGD were done in August 2020 by Dr. Bert.  Biopsy was negative.  When we saw her later in September, the hemoglobin had dropped down to 7.5.  She was transfused with 2 units of packed red blood cells.  Repeat iron studies once again revealed iron deficiency, so was given IV Feraheme .  CT abdomen and pelvis was negative.  She felt so much better after the transfusion that she jumped out of bed and fractured her right foot in a fall.  She was given IV Feraheme  again in November.  Capsule endoscopy in November 2020 in [facility] revealed multiple AVM's of the small bowel.  She had cauterization of an AVM of the small bowel at [facility] in February 2021. In March 2021, her hemoglobin dropped back down 7.6.  She therefore received IV Feraheme  again in early April.  Abdominal ultrasound in May revealed cirrhosis of the liver with nodular surface, increased echogenicity and heterogeneity.  No focal liver parenchymal lesion was observed.  She has had intermittent mild thrombocytopenia, presumably from her liver cirrhosis. She received IV Feraheme  in June 2021, and received 2 units of packed red blood cells at that time. She was admitted to [facility] later in June with generalized weakness, altered mental status, and UTI.  While out of town, she developed severe epistaxis that required packing and 1 unit of packed red blood cells.  CT imaging revealed small amount of blood in the right maxillary and sphenoid sinuses with tube placed in the right paranasal sinus.     She developed a severe epistaxis requiring hospital admission in late July 2021 and had a nasal bullet in place.  Her hemoglobin dropped down to 6.9, she was transfused, and given a dose of IV Feraheme  while in the hospital.  She had an episode of hypotension and fever and was felt to be septic, so was transferred to the ICU.  She was placed on broad-spectrum IV antibiotics, and cultures remained negative.  Echocardiogram showed a suspicious mass in the  right atrium, which may be attached to the interatrial septum.  The cardiologist reviewed the echocardiogram and felt that this was likely an artifact and not a mass, vegetation or thrombus. Her hemoglobin was 8.7 at the time of discharge, and she  was seen in August with weakness of her hands,shakiness, dropping objects, fatigue, dyspnea with exertion and moderate abdominal swelling.  She had asterixis, and ammonia level was elevated at 62.  She was therefore placed on lactulose  30 mL twice a day. She was seen a week later with some improvement, but her serum ammonia was still 54, so the lactulose  was increased to TID.  She also had hyperkalemia, so her spironolactone  25 mg was held.  She was transfused with 2 units of PRBCs again at the end of September when her hemoglobin dropped down to 7.3.  Iron level was 13.0 with a TIBC of 415 for a saturation of 3.1%. She was scheduled for 2 more doses of IV Feraheme .  She was admitted  in October  with a fever of 105 and had E coli sepsis. Lactulose  was placed on hold due to severe diarrhea, adding to her dehydration. Her serum ammonia came down to 9, so we did not resume lactulose .  She did have a new splenic infarction as of this admission, which was causing a lot of left abdominal pain.  Her spironolactone  was increased to 50 mg to take 1 to 2 daily.  At her visit in November, she was placed back on lactulose  daily.   When she was seen in early February 2022, her CBC reveals a hemoglobin of 7.6 with normal ANC and platelets.  She was therefore transfused 2 units of PRBCs. Iron studies revealed recurrent iron deficiency.. Ammonia level was elevated at 35,  so her lactulose  was increased to 30 cc twice daily.  She continued to have blood loss with black stools and one episode of moderate epistaxis,  She saw the gastroenterologist at [facility] on May 27th, Dr. Missy Hancock, and had an EGD with plasma coagulation to cauterize two AVM's.  He has now recommended observation and  will see her back if her hemoglobin drops again, and we document iron deficiency.  She last received IV iron in July/August 2022. She was seen last month, November 2022, for worsening symptoms and was found to have increased ascites. I recommended increase of spironolactone  to 50 mg BID. Her hemoglobin was stable and serum ammonia was normal.    INTERVAL HISTORY:  [name] is here today for a added on appointment due to an increase in her serum ammonia levels. Her serum ammonia did increase to 44 three months ago, and I emphasized the need for compliance with her Lactulose . Last time I saw her it had ome back down to 22 but she was still feeling poorly. She has since seen Dr. Keren in June. She continues Xifaxan 550 mg BID and lactulose  BID down from TID for the last 2 days as that was too strong for her, causing liquid stools. Patient states that she feels ok but complains of dysuria, frequency, lower abdominal discomfort, fatigue, and complains of feeling sleepy all the time. She is having a lot of stress with her significant other and his multiple medical problems. A urine culture was not done however, a urinalysis was done which appeared normal. She has a bone density scan and mammogram scheduled on August, 1st. She informed me that she missed a few days of Lactulose  but has otherwise been compliant. She has a WBC of 7.4, hemoglobin of 13.3, and platelet count of 159,000 improved from 97,000. Her CMP is normal other than a elevated creatinine of 1.16. Her serum ammonia is pending and I will call her with the results. She has an appointment with Dr. Keren in September and I will see her back in 3 months with CBC, CMP, and serum ammonia.   She denies fever, chills, night sweats, or other signs of infection. She denies cardiorespiratory and gastrointestinal issues. She  denies pain. Her appetite is ok and Her weight has decreased 1 pounds over last 2 months.       I have reviewed the past medical  history, past surgical history, social history and family history with the patient and they are unchanged from previous note.  ALLERGIES:  has no known allergies.  MEDICATIONS:  Current Outpatient Medications  Medication Sig Dispense Refill   furosemide  (LASIX ) 40 MG tablet Take 40 mg by mouth daily.     insulin  aspart (NOVOLOG ) 100 UNIT/ML  FlexPen Inject 30 Units into the skin 2 (two) times daily.     lactulose  (CHRONULAC ) 10 GM/15ML solution TAKE 30 MLS (20 G TOTAL) BY MOUTH 3 (THREE) TIMES DAILY. 8514 mL 1   ondansetron  (ZOFRAN ) 4 MG tablet Take 1 tablet (4 mg total) by mouth every 4 (four) hours as needed for nausea. 60 tablet 3   OZEMPIC, 1 MG/DOSE, 4 MG/3ML SOPN Inject 1 mg into the skin once a week.     potassium chloride  SA (KLOR-CON  M) 20 MEQ tablet Take 1 tablet (20 mEq total) by mouth 2 (two) times daily. After 1 week, change to 1 pill by mouth once daily (Patient taking differently: Take 20 mEq by mouth daily. After 1 week, change to 1 pill by mouth once daily) 40 tablet 5   pregabalin  (LYRICA ) 75 MG capsule Take 1 capsule (75 mg total) by mouth daily. At bedtime 30 capsule 5   rOPINIRole  (REQUIP ) 2 MG tablet Take 2 mg by mouth at bedtime.  3   spironolactone  (ALDACTONE ) 50 MG tablet Take 50 mg by mouth 2 (two) times daily.     TOUJEO  MAX SOLOSTAR 300 UNIT/ML Solostar Pen Inject 40 Units into the skin daily.     Vitamin D , Ergocalciferol , (DRISDOL) 1.25 MG (50000 UT) CAPS capsule Take 50,000 Units by mouth once a week.     XIFAXAN 550 MG TABS tablet Take 550 mg by mouth 2 (two) times daily.     No current facility-administered medications for this visit.   REVIEW OF SYSTEMS:  Review of Systems  Constitutional:  Positive for fatigue. Negative for appetite change, chills, diaphoresis, fever and unexpected weight change.  HENT:  Negative.  Negative for hearing loss, lump/mass, mouth sores, nosebleeds, sore throat, tinnitus, trouble swallowing and voice change.   Eyes: Negative.   Negative for eye problems and icterus.  Respiratory: Negative.  Negative for chest tightness, cough, hemoptysis, shortness of breath and wheezing.   Cardiovascular: Negative.  Negative for chest pain, leg swelling and palpitations.  Gastrointestinal:  Positive for abdominal pain (lower abdominal discomfort). Negative for abdominal distention, blood in stool, constipation, diarrhea, nausea, rectal pain and vomiting.  Endocrine: Negative.   Genitourinary:  Positive for dysuria and frequency. Negative for bladder incontinence, difficulty urinating, dyspareunia, hematuria, menstrual problem, nocturia, pelvic pain, vaginal bleeding and vaginal discharge.   Musculoskeletal:  Negative for arthralgias, back pain, flank pain, gait problem, myalgias, neck pain and neck stiffness.  Skin: Negative.  Negative for itching, rash and wound.  Neurological:  Negative for dizziness, extremity weakness, gait problem, headaches, light-headedness, numbness, seizures and speech difficulty.  Hematological: Negative.  Negative for adenopathy. Does not bruise/bleed easily.  Psychiatric/Behavioral: Negative.  Negative for confusion, decreased concentration, depression, sleep disturbance and suicidal ideas. The patient is not nervous/anxious.    VITALS:  Blood pressure 136/69, pulse 85, temperature 98.1 F (36.7 C), temperature source Oral, resp. rate 18, height 5' 3 (1.6 m), weight 168 lb 6.4 oz (76.4 kg), SpO2 99%.  Wt Readings from Last 3 Encounters:  02/08/24 168 lb 6.4 oz (76.4 kg)  11/30/23 169 lb 1.6 oz (76.7 kg)  11/08/23 166 lb 12.8 oz (75.7 kg)    Body mass index is 29.83 kg/m.  Performance status (ECOG): 1 - Symptomatic but completely ambulatory  PHYSICAL EXAM:  Physical Exam Vitals and nursing note reviewed.  Constitutional:      General: She is not in acute distress.    Appearance: Normal appearance. She is normal weight. She is not ill-appearing,  toxic-appearing or diaphoretic.  HENT:     Head:  Normocephalic and atraumatic.     Right Ear: Tympanic membrane, ear canal and external ear normal. There is no impacted cerumen.     Left Ear: Tympanic membrane, ear canal and external ear normal. There is no impacted cerumen.     Nose: Nose normal. No congestion or rhinorrhea.     Comments: Erythema on the nose    Mouth/Throat:     Mouth: Mucous membranes are moist.     Pharynx: Oropharynx is clear. No oropharyngeal exudate or posterior oropharyngeal erythema.  Eyes:     General: No scleral icterus.       Right eye: No discharge.        Left eye: No discharge.     Extraocular Movements: Extraocular movements intact.     Conjunctiva/sclera: Conjunctivae normal.     Pupils: Pupils are equal, round, and reactive to light.  Neck:     Vascular: No carotid bruit.  Cardiovascular:     Rate and Rhythm: Normal rate and regular rhythm.     Pulses: Normal pulses.     Heart sounds: Murmur heard.     Systolic murmur is present with a grade of 2/6.     No friction rub. No gallop.  Pulmonary:     Effort: Pulmonary effort is normal. No respiratory distress.     Breath sounds: Normal breath sounds. No stridor. No wheezing, rhonchi or rales.  Chest:     Chest wall: No tenderness.  Abdominal:     General: Bowel sounds are normal. There is no distension.     Palpations: Abdomen is soft. There is hepatomegaly (mild) and splenomegaly (mild). There is no mass.     Tenderness: There is no abdominal tenderness. There is no right CVA tenderness, left CVA tenderness, guarding or rebound.     Hernia: No hernia is present.     Comments: Mild ascites   Musculoskeletal:        General: No swelling, tenderness, deformity or signs of injury. Normal range of motion.     Cervical back: Normal range of motion and neck supple. No rigidity or tenderness.     Right lower leg: No edema.     Left lower leg: No edema.  Lymphadenopathy:     Cervical: No cervical adenopathy.     Right cervical: No superficial, deep or  posterior cervical adenopathy.    Left cervical: No superficial, deep or posterior cervical adenopathy.     Upper Body:     Right upper body: No supraclavicular, axillary or pectoral adenopathy.     Left upper body: No supraclavicular, axillary or pectoral adenopathy.  Skin:    General: Skin is warm and dry.     Coloration: Skin is not jaundiced or pale.     Findings: No bruising, erythema, lesion or rash.  Neurological:     General: No focal deficit present.     Mental Status: She is alert and oriented to person, place, and time. Mental status is at baseline.     Cranial Nerves: No cranial nerve deficit.     Sensory: No sensory deficit.     Motor: No weakness.     Coordination: Coordination normal.     Gait: Gait normal.     Deep Tendon Reflexes: Reflexes normal.  Psychiatric:        Mood and Affect: Mood normal.        Behavior: Behavior normal.  Thought Content: Thought content normal.        Judgment: Judgment normal.     LABORATORY DATA:  I have reviewed the data as listed    Component Value Date/Time   NA 140 02/08/2024 1302   NA 138 04/06/2023 0000   K 3.7 02/08/2024 1302   CL 102 02/08/2024 1302   CO2 25 02/08/2024 1302   GLUCOSE 159 (H) 02/08/2024 1302   BUN 15 02/08/2024 1302   BUN 13 04/06/2023 0000   CREATININE 1.16 (H) 02/08/2024 1302   CALCIUM 9.9 02/08/2024 1302   PROT 6.8 02/08/2024 1302   ALBUMIN 3.7 02/08/2024 1302   AST 36 02/08/2024 1302   ALT 25 02/08/2024 1302   ALKPHOS 79 02/08/2024 1302   BILITOT 1.2 02/08/2024 1302   GFRNONAA 49 (L) 02/08/2024 1302   GFRAA NOT CALCULATED 04/18/2011 0607   No results found for: SPEP, UPEP  Lab Results  Component Value Date   WBC 7.4 02/08/2024   NEUTROABS 4.9 02/08/2024   HGB 13.3 02/08/2024   HCT 39.8 02/08/2024   MCV 96.1 02/08/2024   PLT 159 02/08/2024   RADIOGRAPHIC STUDIES:    I,Jasmine M Lassiter,acting as a scribe for [name], MD.,have documented all relevant  documentation on the behalf of [name], MD,as directed by  [name], MD while in the presence of [name], MD.

## 2024-02-07 ENCOUNTER — Other Ambulatory Visit

## 2024-02-07 ENCOUNTER — Ambulatory Visit: Admitting: Oncology

## 2024-02-07 ENCOUNTER — Other Ambulatory Visit: Payer: Self-pay

## 2024-02-07 DIAGNOSIS — K7460 Unspecified cirrhosis of liver: Secondary | ICD-10-CM

## 2024-02-07 DIAGNOSIS — K7682 Hepatic encephalopathy: Secondary | ICD-10-CM

## 2024-02-08 ENCOUNTER — Encounter: Payer: Self-pay | Admitting: Oncology

## 2024-02-08 ENCOUNTER — Inpatient Hospital Stay: Attending: Oncology

## 2024-02-08 ENCOUNTER — Telehealth: Payer: Self-pay | Admitting: Oncology

## 2024-02-08 ENCOUNTER — Inpatient Hospital Stay (HOSPITAL_BASED_OUTPATIENT_CLINIC_OR_DEPARTMENT_OTHER): Admitting: Oncology

## 2024-02-08 ENCOUNTER — Other Ambulatory Visit: Payer: Self-pay | Admitting: Oncology

## 2024-02-08 VITALS — BP 136/69 | HR 85 | Temp 98.1°F | Resp 18 | Ht 63.0 in | Wt 168.4 lb

## 2024-02-08 DIAGNOSIS — R300 Dysuria: Secondary | ICD-10-CM | POA: Diagnosis not present

## 2024-02-08 DIAGNOSIS — R7989 Other specified abnormal findings of blood chemistry: Secondary | ICD-10-CM | POA: Insufficient documentation

## 2024-02-08 DIAGNOSIS — D696 Thrombocytopenia, unspecified: Secondary | ICD-10-CM | POA: Diagnosis not present

## 2024-02-08 DIAGNOSIS — K7460 Unspecified cirrhosis of liver: Secondary | ICD-10-CM

## 2024-02-08 DIAGNOSIS — K7682 Hepatic encephalopathy: Secondary | ICD-10-CM

## 2024-02-08 DIAGNOSIS — E876 Hypokalemia: Secondary | ICD-10-CM | POA: Diagnosis not present

## 2024-02-08 DIAGNOSIS — R188 Other ascites: Secondary | ICD-10-CM | POA: Insufficient documentation

## 2024-02-08 DIAGNOSIS — Z862 Personal history of diseases of the blood and blood-forming organs and certain disorders involving the immune mechanism: Secondary | ICD-10-CM | POA: Diagnosis not present

## 2024-02-08 LAB — URINALYSIS, COMPLETE (UACMP) WITH MICROSCOPIC
Bilirubin Urine: NEGATIVE
Glucose, UA: NEGATIVE mg/dL
Hgb urine dipstick: NEGATIVE
Ketones, ur: NEGATIVE mg/dL
Leukocytes,Ua: NEGATIVE
Nitrite: NEGATIVE
Protein, ur: NEGATIVE mg/dL
RBC / HPF: NONE SEEN RBC/hpf (ref 0–5)
Specific Gravity, Urine: 1.01 (ref 1.005–1.030)
pH: 6 (ref 5.0–8.0)

## 2024-02-08 LAB — CMP (CANCER CENTER ONLY)
ALT: 25 U/L (ref 0–44)
AST: 36 U/L (ref 15–41)
Albumin: 3.7 g/dL (ref 3.5–5.0)
Alkaline Phosphatase: 79 U/L (ref 38–126)
Anion gap: 13 (ref 5–15)
BUN: 15 mg/dL (ref 8–23)
CO2: 25 mmol/L (ref 22–32)
Calcium: 9.9 mg/dL (ref 8.9–10.3)
Chloride: 102 mmol/L (ref 98–111)
Creatinine: 1.16 mg/dL — ABNORMAL HIGH (ref 0.44–1.00)
GFR, Estimated: 49 mL/min — ABNORMAL LOW (ref >60)
Glucose, Bld: 159 mg/dL — ABNORMAL HIGH (ref 70–99)
Potassium: 3.7 mmol/L (ref 3.5–5.1)
Sodium: 140 mmol/L (ref 135–145)
Total Bilirubin: 1.2 mg/dL (ref 0.0–1.2)
Total Protein: 6.8 g/dL (ref 6.5–8.1)

## 2024-02-08 LAB — CBC WITH DIFFERENTIAL (CANCER CENTER ONLY)
Abs Immature Granulocytes: 0.03 K/uL (ref 0.00–0.07)
Basophils Absolute: 0.1 K/uL (ref 0.0–0.1)
Basophils Relative: 1 %
Eosinophils Absolute: 0.2 K/uL (ref 0.0–0.5)
Eosinophils Relative: 2 %
HCT: 39.8 % (ref 36.0–46.0)
Hemoglobin: 13.3 g/dL (ref 12.0–15.0)
Immature Granulocytes: 0 %
Lymphocytes Relative: 20 %
Lymphs Abs: 1.5 K/uL (ref 0.7–4.0)
MCH: 32.1 pg (ref 26.0–34.0)
MCHC: 33.4 g/dL (ref 30.0–36.0)
MCV: 96.1 fL (ref 80.0–100.0)
Monocytes Absolute: 0.8 K/uL (ref 0.1–1.0)
Monocytes Relative: 11 %
Neutro Abs: 4.9 K/uL (ref 1.7–7.7)
Neutrophils Relative %: 66 %
Platelet Count: 159 K/uL (ref 150–400)
RBC: 4.14 MIL/uL (ref 3.87–5.11)
RDW: 14.6 % (ref 11.5–15.5)
WBC Count: 7.4 K/uL (ref 4.0–10.5)
nRBC: 0 % (ref 0.0–0.2)

## 2024-02-08 LAB — AMMONIA: Ammonia: 30 umol/L (ref 9–35)

## 2024-02-08 NOTE — Telephone Encounter (Signed)
 Patient has been scheduled for follow-up visit per 02/08/24 LOS.  Pt given an appt calendar with date and time.

## 2024-02-14 DIAGNOSIS — M7541 Impingement syndrome of right shoulder: Secondary | ICD-10-CM | POA: Diagnosis not present

## 2024-02-14 DIAGNOSIS — S52021G Displaced fracture of olecranon process without intraarticular extension of right ulna, subsequent encounter for closed fracture with delayed healing: Secondary | ICD-10-CM | POA: Diagnosis not present

## 2024-02-21 ENCOUNTER — Encounter: Payer: Self-pay | Admitting: Oncology

## 2024-03-14 DIAGNOSIS — Z1231 Encounter for screening mammogram for malignant neoplasm of breast: Secondary | ICD-10-CM | POA: Diagnosis not present

## 2024-03-14 DIAGNOSIS — M810 Age-related osteoporosis without current pathological fracture: Secondary | ICD-10-CM | POA: Diagnosis not present

## 2024-03-14 LAB — HM DEXA SCAN

## 2024-03-14 LAB — HM MAMMOGRAPHY

## 2024-04-04 DIAGNOSIS — J309 Allergic rhinitis, unspecified: Secondary | ICD-10-CM | POA: Diagnosis not present

## 2024-04-10 DIAGNOSIS — E039 Hypothyroidism, unspecified: Secondary | ICD-10-CM | POA: Diagnosis not present

## 2024-04-10 DIAGNOSIS — R809 Proteinuria, unspecified: Secondary | ICD-10-CM | POA: Diagnosis not present

## 2024-04-10 DIAGNOSIS — E785 Hyperlipidemia, unspecified: Secondary | ICD-10-CM | POA: Diagnosis not present

## 2024-04-10 DIAGNOSIS — Z9181 History of falling: Secondary | ICD-10-CM | POA: Diagnosis not present

## 2024-04-10 DIAGNOSIS — D509 Iron deficiency anemia, unspecified: Secondary | ICD-10-CM | POA: Diagnosis not present

## 2024-04-10 DIAGNOSIS — E559 Vitamin D deficiency, unspecified: Secondary | ICD-10-CM | POA: Diagnosis not present

## 2024-04-10 DIAGNOSIS — K7581 Nonalcoholic steatohepatitis (NASH): Secondary | ICD-10-CM | POA: Diagnosis not present

## 2024-04-10 DIAGNOSIS — Z0000 Encounter for general adult medical examination without abnormal findings: Secondary | ICD-10-CM | POA: Diagnosis not present

## 2024-04-10 DIAGNOSIS — E1129 Type 2 diabetes mellitus with other diabetic kidney complication: Secondary | ICD-10-CM | POA: Diagnosis not present

## 2024-04-10 DIAGNOSIS — M810 Age-related osteoporosis without current pathological fracture: Secondary | ICD-10-CM | POA: Diagnosis not present

## 2024-04-25 DIAGNOSIS — L570 Actinic keratosis: Secondary | ICD-10-CM | POA: Diagnosis not present

## 2024-04-25 DIAGNOSIS — L918 Other hypertrophic disorders of the skin: Secondary | ICD-10-CM | POA: Diagnosis not present

## 2024-04-25 DIAGNOSIS — D692 Other nonthrombocytopenic purpura: Secondary | ICD-10-CM | POA: Diagnosis not present

## 2024-04-25 DIAGNOSIS — L820 Inflamed seborrheic keratosis: Secondary | ICD-10-CM | POA: Diagnosis not present

## 2024-04-25 DIAGNOSIS — L821 Other seborrheic keratosis: Secondary | ICD-10-CM | POA: Diagnosis not present

## 2024-05-01 DIAGNOSIS — L2981 Cholestatic pruritus: Secondary | ICD-10-CM | POA: Diagnosis not present

## 2024-05-10 ENCOUNTER — Inpatient Hospital Stay: Admitting: Oncology

## 2024-05-10 ENCOUNTER — Inpatient Hospital Stay

## 2024-05-14 ENCOUNTER — Inpatient Hospital Stay: Attending: Oncology

## 2024-05-14 ENCOUNTER — Other Ambulatory Visit: Payer: Self-pay

## 2024-05-14 ENCOUNTER — Telehealth: Payer: Self-pay | Admitting: Oncology

## 2024-05-14 ENCOUNTER — Other Ambulatory Visit: Payer: Self-pay | Admitting: Oncology

## 2024-05-14 ENCOUNTER — Inpatient Hospital Stay: Admitting: Oncology

## 2024-05-14 VITALS — BP 163/87 | HR 86 | Temp 97.9°F | Resp 18 | Ht 63.0 in | Wt 172.0 lb

## 2024-05-14 DIAGNOSIS — K7460 Unspecified cirrhosis of liver: Secondary | ICD-10-CM | POA: Insufficient documentation

## 2024-05-14 DIAGNOSIS — K7469 Other cirrhosis of liver: Secondary | ICD-10-CM | POA: Diagnosis not present

## 2024-05-14 DIAGNOSIS — D509 Iron deficiency anemia, unspecified: Secondary | ICD-10-CM | POA: Insufficient documentation

## 2024-05-14 DIAGNOSIS — D170 Benign lipomatous neoplasm of skin and subcutaneous tissue of head, face and neck: Secondary | ICD-10-CM | POA: Diagnosis not present

## 2024-05-14 DIAGNOSIS — M810 Age-related osteoporosis without current pathological fracture: Secondary | ICD-10-CM | POA: Diagnosis not present

## 2024-05-14 DIAGNOSIS — K7682 Hepatic encephalopathy: Secondary | ICD-10-CM | POA: Insufficient documentation

## 2024-05-14 DIAGNOSIS — D696 Thrombocytopenia, unspecified: Secondary | ICD-10-CM | POA: Diagnosis not present

## 2024-05-14 DIAGNOSIS — R7989 Other specified abnormal findings of blood chemistry: Secondary | ICD-10-CM | POA: Diagnosis not present

## 2024-05-14 DIAGNOSIS — R188 Other ascites: Secondary | ICD-10-CM | POA: Diagnosis not present

## 2024-05-14 DIAGNOSIS — Z79899 Other long term (current) drug therapy: Secondary | ICD-10-CM | POA: Insufficient documentation

## 2024-05-14 LAB — CBC WITH DIFFERENTIAL (CANCER CENTER ONLY)
Abs Immature Granulocytes: 0.03 K/uL (ref 0.00–0.07)
Basophils Absolute: 0 K/uL (ref 0.0–0.1)
Basophils Relative: 1 %
Eosinophils Absolute: 0.2 K/uL (ref 0.0–0.5)
Eosinophils Relative: 2 %
HCT: 40.2 % (ref 36.0–46.0)
Hemoglobin: 13.3 g/dL (ref 12.0–15.0)
Immature Granulocytes: 0 %
Lymphocytes Relative: 17 %
Lymphs Abs: 1.3 K/uL (ref 0.7–4.0)
MCH: 31.9 pg (ref 26.0–34.0)
MCHC: 33.1 g/dL (ref 30.0–36.0)
MCV: 96.4 fL (ref 80.0–100.0)
Monocytes Absolute: 0.4 K/uL (ref 0.1–1.0)
Monocytes Relative: 6 %
Neutro Abs: 5.4 K/uL (ref 1.7–7.7)
Neutrophils Relative %: 74 %
Platelet Count: 152 K/uL (ref 150–400)
RBC: 4.17 MIL/uL (ref 3.87–5.11)
RDW: 14.8 % (ref 11.5–15.5)
WBC Count: 7.3 K/uL (ref 4.0–10.5)
nRBC: 0 % (ref 0.0–0.2)

## 2024-05-14 LAB — CMP (CANCER CENTER ONLY)
ALT: 29 U/L (ref 0–44)
AST: 37 U/L (ref 15–41)
Albumin: 4.2 g/dL (ref 3.5–5.0)
Alkaline Phosphatase: 72 U/L (ref 38–126)
Anion gap: 12 (ref 5–15)
BUN: 19 mg/dL (ref 8–23)
CO2: 26 mmol/L (ref 22–32)
Calcium: 10.3 mg/dL (ref 8.9–10.3)
Chloride: 104 mmol/L (ref 98–111)
Creatinine: 1.26 mg/dL — ABNORMAL HIGH (ref 0.44–1.00)
GFR, Estimated: 44 mL/min — ABNORMAL LOW (ref >60)
Glucose, Bld: 190 mg/dL — ABNORMAL HIGH (ref 70–99)
Potassium: 3.9 mmol/L (ref 3.5–5.1)
Sodium: 141 mmol/L (ref 135–145)
Total Bilirubin: 1.3 mg/dL — ABNORMAL HIGH (ref 0.0–1.2)
Total Protein: 7.1 g/dL (ref 6.5–8.1)

## 2024-05-14 LAB — AMMONIA: Ammonia: 20 umol/L (ref 9–35)

## 2024-05-14 NOTE — Progress Notes (Signed)
 [facility]  [address] [place],  [place]  [address] [phone]  Patient Care Team: Keren Vicenta BRAVO, MD as PCP - General (Internal Medicine) Tobb, Kardie, DO as PCP - Cardiology (Cardiology) Cornelius Wanda DEL, MD as Consulting Physician (Oncology) Russella Missy Bers, MD as Referring Physician (Gastroenterology)  Clinic Day:05/14/24   Referring physician: Keren Vicenta BRAVO, MD  ASSESSMENT & PLAN:  Assessment: 1. Iron deficiency anemia in the past. Multiple AVM's of the small bowel seen on capsule endoscopy in [facility], treated at [facility] in February 2021. Now resolved since Dr. Russella at [facility] did cauterize her AVM's. She was given IV iron in April 2023, and has not required any since then.    2. Non-alcoholic liver cirrhosis, with intermittent mild thrombocytopenia. Her total bilirubin did go up slightly today at 1.3 today.    3. Ascites, improved and minimal, for which she is on spironolactone  50 mg BID and furosemide  40 mg daily.   4. Hepatic encephalopathy.  Her serum ammonia was elevated at 44 three months ago. She has required 3 admission into the hospital last year with this diagnosis and her serum ammonia was 145. I have stressed that she needs to continue her current dose of Lactulose  45mL to 30mL three times daily as much as possible in order to keep this under control. The Xifaxan is helping. I suspect her confusion in [place]  was related to missing her medications during that time.   5. Thrombocytopenia, resolved.   6. Left neck pain and tightness. I don't think the lipoma of her left posterior neck is related to this and mainly reassured her. I recommended she use heat and we can add a muscle relaxant if it does not resolve.   7.  Osteoporosis. The patient had a DEXA scan done 02/08/2024 that revealed osteoporosis of the left femoral neck with a T-score of -3.7, left total hip with a T-score of -2.9, right femoral neck with a  T-score of -3.9, and left 1/3 radius with a T-score of -4. She was also found to have osteopenia of the right total hip with a T-score of -2.2.   Plan:  The patient complains of a swollen area of the left side of her neck down into her shoulder. I feel a small lipoma of the left posterior neck and I advised her to apply a warm compress. If her pain continues I instructed her to contact my office. She informed me that she recently went on a trip to [place] and had to spend extra nights in [place]  due to rough weather and was not able to take any of her medication in [place] . She states that she began to experience some confusion during that time. The patient had a DEXA scan done 02/08/2024 that revealed osteoporosis of the left femoral neck with a T-score of -3.7, left total hip with a T-score of -2.9, right femoral neck with a T-score of -3.9, and left 1/3 radius with a T-score of -4. She was also found to have osteopenia of the right total hip with a T-score of -2.2. She also had a screening bilateral mammogram done on 03/14/2024 that was clear. She has a WBC of 7.3, stable hemoglobin of 13.3, and platelet count of 152,000. Her CMP is normal other than an elevated glucose of 190, elevated creatinine of 1.26 and total bilirubin of 1.3. Her serum ammonia level today is pending. She will meet with her PCP in November. I will see her  back in 3 months with CBC, CMP, and serum ammonia. The patient understands the plans discussed today and is in agreement with them.  She knows to contact our office if she develops concerns prior to her next appointment.  I provided 14 minutes of face-to-face time during this this encounter and > 50% was spent counseling as documented under my assessment and plan.   [name], MD  [facility] - A DEPT OF [facility] [address] [place] [address] Dept: [phone] Dept Fax: [phone]   No orders of  the defined types were placed in this encounter.   CHIEF COMPLAINT:  CC: History of iron deficiency anemia  Current Treatment:  Surveillance   HISTORY OF PRESENT ILLNESS:  [name] is a 73 y.o. female  who we began seeing in December 2019 for iron deficiency anemia. In November 2018, colonoscopy revealed tubular adenomas.  Upper endoscopy in January 2018 did not reveal any specific findings.  She was clearly iron deficient.  She has multiple comorbidities, including diabetes, liver cirrhosis, COPD, hyperlipidemia, and degenerative disc disease.  Testing for any other deficiency or monoclonal spike was negative. She had already been on oral supplement for nearly 1 year with lack of response, so was given IV iron in the form of Feraheme  on several occasions. Repeat colonoscopy and EGD were done in August 2020 by Dr. Bert.  Biopsy was negative.  When we saw her later in September, the hemoglobin had dropped down to 7.5.  She was transfused with 2 units of packed red blood cells.  Repeat iron studies once again revealed iron deficiency, so was given IV Feraheme .  CT abdomen and pelvis was negative.  She felt so much better after the transfusion that she jumped out of bed and fractured her right foot in a fall.  She was given IV Feraheme  again in November.  Capsule endoscopy in November 2020 in [facility] revealed multiple AVM's of the small bowel.  She had cauterization of an AVM of the small bowel at [facility] in February 2021. In March 2021, her hemoglobin dropped back down 7.6.  She therefore received IV Feraheme  again in early April.  Abdominal ultrasound in May revealed cirrhosis of the liver with nodular surface, increased echogenicity and heterogeneity.  No focal liver parenchymal lesion was observed.  She has had intermittent mild thrombocytopenia, presumably from her liver cirrhosis. She received IV Feraheme  in June 2021, and received 2 units of packed red blood cells at that time. She  was admitted to [facility] later in June with generalized weakness, altered mental status, and UTI.  While out of town, she developed severe epistaxis that required packing and 1 unit of packed red blood cells.  CT imaging revealed small amount of blood in the right maxillary and sphenoid sinuses with tube placed in the right paranasal sinus.     She developed a severe epistaxis requiring hospital admission in late July 2021 and had a nasal bullet in place.  Her hemoglobin dropped down to 6.9, she was transfused, and given a dose of IV Feraheme  while in the hospital.  She had an episode of hypotension and fever and was felt to be septic, so was transferred to the ICU.  She was placed on broad-spectrum IV antibiotics, and cultures remained negative.  Echocardiogram showed a suspicious mass in the right atrium, which may be attached to the interatrial septum.  The cardiologist reviewed the  echocardiogram and felt that this was likely an artifact and not a mass, vegetation or thrombus. Her hemoglobin was 8.7 at the time of discharge, and she  was seen in August with weakness of her hands,shakiness, dropping objects, fatigue, dyspnea with exertion and moderate abdominal swelling.  She had asterixis, and ammonia level was elevated at 62.  She was therefore placed on lactulose  30 mL twice a day. She was seen a week later with some improvement, but her serum ammonia was still 54, so the lactulose  was increased to TID.  She also had hyperkalemia, so her spironolactone  25 mg was held.  She was transfused with 2 units of PRBCs again at the end of September when her hemoglobin dropped down to 7.3.  Iron level was 13.0 with a TIBC of 415 for a saturation of 3.1%. She was scheduled for 2 more doses of IV Feraheme .  She was admitted in October  with a fever of 105 and had E coli sepsis. Lactulose  was placed on hold due to severe diarrhea, adding to her dehydration. Her serum ammonia came down to 9, so we did not resume lactulose .   She did have a new splenic infarction as of this admission, which was causing a lot of left abdominal pain.  Her spironolactone  was increased to 50 mg to take 1 to 2 daily.  At her visit in November, she was placed back on lactulose  daily.   When she was seen in early February 2022, her CBC reveals a hemoglobin of 7.6 with normal ANC and platelets.  She was therefore transfused 2 units of PRBCs. Iron studies revealed recurrent iron deficiency.. Ammonia level was elevated at 35,  so her lactulose  was increased to 30 cc twice daily.  She continued to have blood loss with black stools and one episode of moderate epistaxis,  She saw the gastroenterologist at [facility] on May 27th, Dr. Missy Hancock, and had an EGD with plasma coagulation to cauterize two AVM's.  He has now recommended observation and will see her back if her hemoglobin drops again, and we document iron deficiency.  She last received IV iron in July/August 2022. She was seen last month, November 2022, for worsening symptoms and was found to have increased ascites. I recommended increase of spironolactone  to 50 mg BID. Her hemoglobin was stable and serum ammonia was normal.  INTERVAL HISTORY:  Aitana is here today for repeat clinical assessment for her history of iron deficiency anemia, liver cirrhosis, ascites, hepatic encephalopathy. Patient states that she feels ok but complains of neck stiffness/pain and a swollen area of the left side of her neck down into her shoulder. I feel a small lipoma of the left posterior neck which is not likely the cause of her pain. I advised her to apply a warm compress. If her pain continues I instructed her to contact my office. She informed me that she recently went on a trip to [place] and had to spend extra nights in [place]  due to rough weather and was not able to take any of her medication in [place] . She states that she began to experience some confusion during that time. The patient had a DEXA scan done  02/08/2024 that revealed osteoporosis of the left femoral neck with a T-score of -3.7, left total hip with a T-score of -2.9, right femoral neck with a T-score of -3.9, and left 1/3 radius with a T-score of -4. She was also found to have osteopenia of the right total hip with a  T-score of -2.2. She also had a screening bilateral mammogram done on 03/14/2024 that was clear. She has a WBC of 7.3, stable hemoglobin of 13.3, and platelet count of 152,000. Her CMP is normal other than an elevated glucose of 190, elevated creatinine of 1.26 and total bilirubin of 1.3. Her serum ammonia level today is pending. She will meet with her PCP in November. I will see her back in 3 months with CBC, CMP, and serum ammonia.    She denies fever, chills, night sweats, or other signs of infection. She denies cardiorespiratory and gastrointestinal issues. She  denies pain. Her appetite is increased at night and Her weight has increased 4 pounds over last 3 months.      I have reviewed the past medical history, past surgical history, social history and family history with the patient and they are unchanged from previous note.  ALLERGIES:  has no known allergies.  MEDICATIONS:  Current Outpatient Medications  Medication Sig Dispense Refill   furosemide  (LASIX ) 40 MG tablet Take 40 mg by mouth daily.     insulin  aspart (NOVOLOG ) 100 UNIT/ML FlexPen Inject 30 Units into the skin 2 (two) times daily.     lactulose  (CHRONULAC ) 10 GM/15ML solution TAKE 30 MLS (20 G TOTAL) BY MOUTH 3 (THREE) TIMES DAILY. 8514 mL 1   ondansetron  (ZOFRAN ) 4 MG tablet Take 1 tablet (4 mg total) by mouth every 4 (four) hours as needed for nausea. 60 tablet 3   OZEMPIC, 1 MG/DOSE, 4 MG/3ML SOPN Inject 1 mg into the skin once a week.     potassium chloride  SA (KLOR-CON  M) 20 MEQ tablet Take 1 tablet (20 mEq total) by mouth 2 (two) times daily. After 1 week, change to 1 pill by mouth once daily (Patient taking differently: Take 20 mEq by mouth daily.  After 1 week, change to 1 pill by mouth once daily) 40 tablet 5   pregabalin  (LYRICA ) 75 MG capsule Take 1 capsule (75 mg total) by mouth daily. At bedtime 30 capsule 5   rOPINIRole  (REQUIP ) 2 MG tablet Take 2 mg by mouth at bedtime.  3   spironolactone  (ALDACTONE ) 50 MG tablet Take 50 mg by mouth 2 (two) times daily.     TOUJEO  MAX SOLOSTAR 300 UNIT/ML Solostar Pen Inject 40 Units into the skin daily.     Vitamin D , Ergocalciferol , (DRISDOL) 1.25 MG (50000 UT) CAPS capsule Take 50,000 Units by mouth once a week.     XIFAXAN 550 MG TABS tablet Take 550 mg by mouth 2 (two) times daily.     No current facility-administered medications for this visit.   REVIEW OF SYSTEMS:  Review of Systems  Constitutional:  Positive for fatigue. Negative for appetite change, chills, diaphoresis, fever and unexpected weight change.  HENT:  Negative.  Negative for hearing loss, lump/mass, mouth sores, nosebleeds, sore throat, tinnitus, trouble swallowing and voice change.   Eyes: Negative.  Negative for eye problems and icterus.  Respiratory: Negative.  Negative for chest tightness, cough, hemoptysis, shortness of breath and wheezing.   Cardiovascular: Negative.  Negative for chest pain, leg swelling and palpitations.  Gastrointestinal:  Positive for abdominal pain (right upper quadrant). Negative for abdominal distention, blood in stool, constipation, diarrhea, nausea, rectal pain and vomiting.  Endocrine: Negative.   Genitourinary:  Negative for bladder incontinence, difficulty urinating, dyspareunia, dysuria, frequency, hematuria, menstrual problem, nocturia, pelvic pain, vaginal bleeding and vaginal discharge.   Musculoskeletal:  Positive for neck pain and neck stiffness. Negative for  arthralgias, back pain, flank pain, gait problem and myalgias.  Skin: Negative.  Negative for itching, rash and wound.  Neurological:  Negative for dizziness, extremity weakness, gait problem, headaches, light-headedness,  numbness, seizures and speech difficulty.  Hematological: Negative.  Negative for adenopathy. Does not bruise/bleed easily.  Psychiatric/Behavioral:  Positive for confusion (transient) and sleep disturbance. Negative for decreased concentration, depression and suicidal ideas. The patient is not nervous/anxious.    VITALS:  Blood pressure (!) 163/87, pulse 86, temperature 97.9 F (36.6 C), temperature source Oral, resp. rate 18, height 5' 3 (1.6 m), weight 172 lb (78 kg), SpO2 97%.  Wt Readings from Last 3 Encounters:  05/14/24 172 lb (78 kg)  02/08/24 168 lb 6.4 oz (76.4 kg)  11/30/23 169 lb 1.6 oz (76.7 kg)    Body mass index is 30.47 kg/m.  Performance status (ECOG): 1 - Symptomatic but completely ambulatory  PHYSICAL EXAM:  Physical Exam Vitals and nursing note reviewed.  Constitutional:      General: She is not in acute distress.    Appearance: Normal appearance. She is normal weight. She is not ill-appearing, toxic-appearing or diaphoretic.  HENT:     Head: Normocephalic and atraumatic.     Right Ear: Tympanic membrane, ear canal and external ear normal. There is no impacted cerumen.     Left Ear: Tympanic membrane, ear canal and external ear normal. There is no impacted cerumen.     Nose: Nose normal. No congestion or rhinorrhea.     Mouth/Throat:     Mouth: Mucous membranes are moist.     Pharynx: Oropharynx is clear. No oropharyngeal exudate or posterior oropharyngeal erythema.  Eyes:     General: No scleral icterus.       Right eye: No discharge.        Left eye: No discharge.     Extraocular Movements: Extraocular movements intact.     Conjunctiva/sclera: Conjunctivae normal.     Pupils: Pupils are equal, round, and reactive to light.  Neck:     Vascular: No carotid bruit.     Comments: She has a small lipoma of the left posterior neck measuring about 2cm in diameter, soft and non-tender.  Increased muscle tone in the left neck with tense tendon Cardiovascular:      Rate and Rhythm: Normal rate and regular rhythm.     Pulses: Normal pulses.     Heart sounds: Murmur heard.     Systolic murmur is present with a grade of 2/6.     No friction rub. No gallop.  Pulmonary:     Effort: Pulmonary effort is normal. No respiratory distress.     Breath sounds: Normal breath sounds. No stridor. No wheezing, rhonchi or rales.  Chest:     Chest wall: No tenderness.  Abdominal:     General: Bowel sounds are normal. There is no distension.     Palpations: Abdomen is soft. There is no hepatomegaly, splenomegaly or mass.     Tenderness: There is no abdominal tenderness. There is no right CVA tenderness, left CVA tenderness, guarding or rebound.     Hernia: No hernia is present.  Musculoskeletal:        General: No swelling, tenderness, deformity or signs of injury. Normal range of motion.     Cervical back: Normal range of motion and neck supple. No rigidity or tenderness.     Right lower leg: No edema.     Left lower leg: No edema.  Lymphadenopathy:  Cervical: No cervical adenopathy.     Right cervical: No superficial, deep or posterior cervical adenopathy.    Left cervical: No superficial, deep or posterior cervical adenopathy.     Upper Body:     Right upper body: No supraclavicular, axillary or pectoral adenopathy.     Left upper body: No supraclavicular, axillary or pectoral adenopathy.  Skin:    General: Skin is warm and dry.     Coloration: Skin is not jaundiced or pale.     Findings: No bruising, erythema, lesion or rash.  Neurological:     General: No focal deficit present.     Mental Status: She is alert and oriented to person, place, and time. Mental status is at baseline.     Cranial Nerves: No cranial nerve deficit.     Sensory: No sensory deficit.     Motor: No weakness.     Coordination: Coordination normal.     Gait: Gait normal.     Deep Tendon Reflexes: Reflexes normal.  Psychiatric:        Mood and Affect: Mood normal.         Behavior: Behavior normal.        Thought Content: Thought content normal.        Judgment: Judgment normal.     LABORATORY DATA:  I have reviewed the data as listed    Component Value Date/Time   NA 141 05/14/2024 1033   NA 138 04/06/2023 0000   K 3.9 05/14/2024 1033   CL 104 05/14/2024 1033   CO2 26 05/14/2024 1033   GLUCOSE 190 (H) 05/14/2024 1033   BUN 19 05/14/2024 1033   BUN 13 04/06/2023 0000   CREATININE 1.26 (H) 05/14/2024 1033   CALCIUM 10.3 05/14/2024 1033   PROT 7.1 05/14/2024 1033   ALBUMIN 4.2 05/14/2024 1033   AST 37 05/14/2024 1033   ALT 29 05/14/2024 1033   ALKPHOS 72 05/14/2024 1033   BILITOT 1.3 (H) 05/14/2024 1033   GFRNONAA 44 (L) 05/14/2024 1033   GFRAA NOT CALCULATED 04/18/2011 0607   No results found for: SPEP, UPEP  Lab Results  Component Value Date   WBC 7.3 05/14/2024   NEUTROABS 5.4 05/14/2024   HGB 13.3 05/14/2024   HCT 40.2 05/14/2024   MCV 96.4 05/14/2024   PLT 152 05/14/2024   RADIOGRAPHIC STUDIES: Exam: 03/14/2024 Screening Bilateral Mammogram with Tomosynthesis Impression: No mammographic evidence of malignancy  Exam: 02/08/2024 DEXA Impression: Left Femoral Neck: T-score of -3.7 Left Total Hip: T-score of -2.9 Right Femoral Neck: T-score of -3.9 Right Total Hip: T-score of -2.2.  Left 1/3 radius: T-score of -4.      I,Jasmine M Lassiter,acting as a scribe for [name], MD.,have documented all relevant documentation on the behalf of [name], MD,as directed by  [name], MD while in the presence of [name], MD.

## 2024-05-14 NOTE — Telephone Encounter (Signed)
 Patient has been scheduled for follow-up visit per 05/14/24 LOS.  Pt given an appt calendar with date and time.

## 2024-05-15 DIAGNOSIS — M810 Age-related osteoporosis without current pathological fracture: Secondary | ICD-10-CM | POA: Diagnosis not present

## 2024-05-17 ENCOUNTER — Telehealth: Payer: Self-pay

## 2024-05-17 NOTE — Telephone Encounter (Signed)
 Latest Reference Range & Units 05/14/24 10:32  Ammonia 9 - 35 umol/L 20

## 2024-05-30 ENCOUNTER — Encounter: Payer: Self-pay | Admitting: Oncology

## 2024-05-30 DIAGNOSIS — M810 Age-related osteoporosis without current pathological fracture: Secondary | ICD-10-CM | POA: Insufficient documentation

## 2024-05-31 ENCOUNTER — Telehealth: Payer: Self-pay

## 2024-05-31 NOTE — Telephone Encounter (Signed)
Called patient and notified her of the lab results. 

## 2024-05-31 NOTE — Telephone Encounter (Signed)
-----   Message from [name] sent at 05/29/2024  8:29 PM EST ----- Regarding: call Tell her I am sorry we did not call sooner but her serum ammonia is normal

## 2024-07-19 ENCOUNTER — Other Ambulatory Visit: Payer: Self-pay | Admitting: Hematology and Oncology

## 2024-07-19 DIAGNOSIS — K7682 Hepatic encephalopathy: Secondary | ICD-10-CM

## 2024-07-22 ENCOUNTER — Encounter: Payer: Self-pay | Admitting: Oncology

## 2024-08-14 ENCOUNTER — Other Ambulatory Visit: Payer: Self-pay | Admitting: Oncology

## 2024-08-14 ENCOUNTER — Inpatient Hospital Stay: Attending: Oncology | Admitting: Oncology

## 2024-08-14 ENCOUNTER — Inpatient Hospital Stay

## 2024-08-14 VITALS — BP 180/88 | HR 75 | Temp 98.1°F | Resp 18 | Ht 63.0 in | Wt 176.0 lb

## 2024-08-14 DIAGNOSIS — K7682 Hepatic encephalopathy: Secondary | ICD-10-CM

## 2024-08-14 DIAGNOSIS — D509 Iron deficiency anemia, unspecified: Secondary | ICD-10-CM | POA: Diagnosis present

## 2024-08-14 DIAGNOSIS — K7460 Unspecified cirrhosis of liver: Secondary | ICD-10-CM | POA: Insufficient documentation

## 2024-08-14 DIAGNOSIS — M810 Age-related osteoporosis without current pathological fracture: Secondary | ICD-10-CM | POA: Diagnosis not present

## 2024-08-14 DIAGNOSIS — R188 Other ascites: Secondary | ICD-10-CM | POA: Diagnosis not present

## 2024-08-14 DIAGNOSIS — D696 Thrombocytopenia, unspecified: Secondary | ICD-10-CM | POA: Diagnosis not present

## 2024-08-14 DIAGNOSIS — R7989 Other specified abnormal findings of blood chemistry: Secondary | ICD-10-CM | POA: Insufficient documentation

## 2024-08-14 DIAGNOSIS — E1165 Type 2 diabetes mellitus with hyperglycemia: Secondary | ICD-10-CM | POA: Insufficient documentation

## 2024-08-14 LAB — CBC WITH DIFFERENTIAL (CANCER CENTER ONLY)
Abs Immature Granulocytes: 0.06 K/uL (ref 0.00–0.07)
Basophils Absolute: 0 K/uL (ref 0.0–0.1)
Basophils Relative: 0 %
Eosinophils Absolute: 0 K/uL (ref 0.0–0.5)
Eosinophils Relative: 0 %
HCT: 38.3 % (ref 36.0–46.0)
Hemoglobin: 12.9 g/dL (ref 12.0–15.0)
Immature Granulocytes: 1 %
Lymphocytes Relative: 11 %
Lymphs Abs: 1 K/uL (ref 0.7–4.0)
MCH: 31.9 pg (ref 26.0–34.0)
MCHC: 33.7 g/dL (ref 30.0–36.0)
MCV: 94.6 fL (ref 80.0–100.0)
Monocytes Absolute: 0.4 K/uL (ref 0.1–1.0)
Monocytes Relative: 4 %
Neutro Abs: 7.6 K/uL (ref 1.7–7.7)
Neutrophils Relative %: 84 %
Platelet Count: 125 K/uL — ABNORMAL LOW (ref 150–400)
RBC: 4.05 MIL/uL (ref 3.87–5.11)
RDW: 13.9 % (ref 11.5–15.5)
WBC Count: 9.1 K/uL (ref 4.0–10.5)
nRBC: 0 % (ref 0.0–0.2)

## 2024-08-14 LAB — CMP (CANCER CENTER ONLY)
ALT: 20 U/L (ref 0–44)
AST: 28 U/L (ref 15–41)
Albumin: 3.8 g/dL (ref 3.5–5.0)
Alkaline Phosphatase: 62 U/L (ref 38–126)
Anion gap: 11 (ref 5–15)
BUN: 20 mg/dL (ref 8–23)
CO2: 25 mmol/L (ref 22–32)
Calcium: 9.8 mg/dL (ref 8.9–10.3)
Chloride: 105 mmol/L (ref 98–111)
Creatinine: 1.09 mg/dL — ABNORMAL HIGH (ref 0.44–1.00)
GFR, Estimated: 53 mL/min — ABNORMAL LOW
Glucose, Bld: 239 mg/dL — ABNORMAL HIGH (ref 70–99)
Potassium: 4 mmol/L (ref 3.5–5.1)
Sodium: 141 mmol/L (ref 135–145)
Total Bilirubin: 1.2 mg/dL (ref 0.0–1.2)
Total Protein: 6.6 g/dL (ref 6.5–8.1)

## 2024-08-14 LAB — AMMONIA: Ammonia: 18 umol/L (ref 9–35)

## 2024-08-14 NOTE — Progress Notes (Signed)
 " [name]  [address] [place],  [place]  [address] [phone]  Patient Care Team: Destiny Vicenta BRAVO, MD as PCP - General (Internal Medicine) Destiny Pugh, DO as PCP - Cardiology (Cardiology) Destiny Destiny DEL, MD as Consulting Physician (Oncology) Destiny Missy Bers, MD as Referring Physician (Gastroenterology)  Clinic Day:  08/14/24  Referring physician: Keren Vicenta BRAVO, MD  ASSESSMENT & PLAN:  Assessment: 1. Iron deficiency anemia in the past. Multiple AVM's of the small bowel seen on capsule endoscopy in [facility], treated at [facility] in February 2021. Now resolved since Dr. Russella at [facility] did cauterize her AVM's. She was given IV iron in April 2023, and has not required any since then.    2. Non-alcoholic liver cirrhosis, with intermittent mild thrombocytopenia. Her total bilirubin is 1.2 today.    3. Ascites, improved and minimal, for which she is on spironolactone  50 mg BID and furosemide  40 mg daily.   4. Hepatic encephalopathy.  Her serum ammonia was elevated at 44 three months ago. She has required 3 admission into the [facility] last year with this diagnosis and her serum ammonia was 145. I have stressed that she needs to continue her current dose of Lactulose  45mL to 30mL three times daily as much as possible in order to keep this under control. The Xifaxan is helping and her serum ammonia has remained normal since April 2025.   5. Thrombocytopenia, mild.   6. Osteoporosis. The patient had a DEXA scan done 02/08/2024 that revealed osteoporosis of the left femoral neck with a T-score of -3.7, left total hip with a T-score of -2.9, right femoral neck with a T-score of -3.9, and left 1/3 radius with a T-score of -4. She was also found to have osteopenia of the right total hip with a T-score of -2.2. We need to discuss therapy.  Plan:  Her BP today is elevated at 190/70 and she states this could be due to nerves as her husband has  an melanoma resection appointment later today. She has a WBC of 9.1, hemoglobin of 12.9, and low platelet count of 125,000 down from 152,000. Her CMP is fairly normal other than an elevated glucose of 239 and creatinine of 1.09 although improved from 1.26. She informed me that she had an injection for bursitis and was told that this would elevate her glucose levels. She has also not taken her morning medications. Her serum ammonia level is pending. I will see her back in 3 months with CBC, CMP, and serum ammonia level. The patient understands the plans discussed today and is in agreement with them.  She knows to contact our office if she develops concerns prior to her next appointment.  I provided 15 minutes of face-to-face time during this this encounter and > 50% was spent counseling as documented under my assessment and plan.   [name] Cornelius, MD  [name] [address] [place] [address] Dept: [phone] Dept Fax: [phone]   No orders of the defined types were placed in this encounter.   CHIEF COMPLAINT:  CC: History of iron deficiency anemia  Current Treatment:  Surveillance   HISTORY OF PRESENT ILLNESS:  [name] is a 74 y.o. female  who we began seeing in December 2019 for iron deficiency anemia. In November 2018, colonoscopy revealed tubular adenomas.  Upper endoscopy in January 2018 did not  reveal any specific findings.  She was clearly iron deficient.  She has multiple comorbidities, including diabetes, liver cirrhosis, COPD, hyperlipidemia, and degenerative disc disease.  Testing for any other deficiency or monoclonal spike was negative. She had already been on oral supplement for nearly 1 year with lack of response, so was given IV iron in the form of Feraheme  on several occasions. Repeat colonoscopy and EGD were done in August 2020 by Dr. Bert.  Biopsy was negative.  When  we saw her later in September, the hemoglobin had dropped down to 7.5.  She was transfused with 2 units of packed red blood cells.  Repeat iron studies once again revealed iron deficiency, so was given IV Feraheme .  CT abdomen and pelvis was negative.  She felt so much better after the transfusion that she jumped out of bed and fractured her right foot in a fall.  She was given IV Feraheme  again in November.  Capsule endoscopy in November 2020 in [facility] revealed multiple AVM's of the small bowel.  She had cauterization of an AVM of the small bowel at [facility] in February 2021. In March 2021, her hemoglobin dropped back down 7.6.  She therefore received IV Feraheme  again in early April.  Abdominal ultrasound in May revealed cirrhosis of the liver with nodular surface, increased echogenicity and heterogeneity.  No focal liver parenchymal lesion was observed.  She has had intermittent mild thrombocytopenia, presumably from her liver cirrhosis. She received IV Feraheme  in June 2021, and received 2 units of packed red blood cells at that time. She was admitted to [facility] later in June with generalized weakness, altered mental status, and UTI.  While out of town, she developed severe epistaxis that required packing and 1 unit of packed red blood cells.  CT imaging revealed small amount of blood in the right maxillary and sphenoid sinuses with tube placed in the right paranasal sinus.     She developed a severe epistaxis requiring [facility] admission in late July 2021 and had a nasal bullet in place.  Her hemoglobin dropped down to 6.9, she was transfused, and given a dose of IV Feraheme  while in the [facility].  She had an episode of hypotension and fever and was felt to be septic, so was transferred to the ICU.  She was placed on broad-spectrum IV antibiotics, and cultures remained negative.  Echocardiogram showed a suspicious mass in the right atrium, which may be attached to the interatrial septum.  The  cardiologist reviewed the echocardiogram and felt that this was likely an artifact and not a mass, vegetation or thrombus. Her hemoglobin was 8.7 at the time of discharge, and she  was seen in August with weakness of her hands,shakiness, dropping objects, fatigue, dyspnea with exertion and moderate abdominal swelling.  She had asterixis, and ammonia level was elevated at 62.  She was therefore placed on lactulose  30 mL twice a day. She was seen a week later with some improvement, but her serum ammonia was still 54, so the lactulose  was increased to TID.  She also had hyperkalemia, so her spironolactone  25 mg was held.  She was transfused with 2 units of PRBCs again at the end of September when her hemoglobin dropped down to 7.3.  Iron level was 13.0 with a TIBC of 415 for a saturation of 3.1%. She was scheduled for 2 more doses of IV Feraheme .  She was admitted in October  with a fever of 105 and had E coli sepsis. Lactulose  was  placed on hold due to severe diarrhea, adding to her dehydration. Her serum ammonia came down to 9, so we did not resume lactulose .  She did have a new splenic infarction as of this admission, which was causing a lot of left abdominal pain.  Her spironolactone  was increased to 50 mg to take 1 to 2 daily.  At her visit in November, she was placed back on lactulose  daily.   When she was seen in early February, 2022, her CBC reveals a hemoglobin of 7.6 with normal ANC and platelets.  She was therefore transfused 2 units of PRBCs. Iron studies revealed recurrent iron deficiency.. Ammonia level was elevated at 35,  so her lactulose  was increased to 30 cc twice daily.  She continued to have blood loss with black stools and one episode of moderate epistaxis,  She saw the gastroenterologist at [facility] on May 27th, Dr. Missy Hancock, and had an EGD with plasma coagulation to cauterize two AVM's.  He has now recommended observation and will see her back if her hemoglobin drops again, and we document  iron deficiency.  She last received IV iron in July/August 2022. She was seen last month, November 2022, for worsening symptoms and was found to have increased ascites. I recommended increase of spironolactone  to 50 mg BID. Her hemoglobin was stable and serum ammonia was normal.  INTERVAL HISTORY:  [name] is here today for repeat clinical assessment for her history of iron deficiency anemia, liver cirrhosis, ascites, hepatic encephalopathy. Patient states that she feels anxious but has no complaints of pain. Her BP today is elevated at 190/70 and she states this could be due to nerves as her husband is has an melanoma resection appointment later today. She has a WBC of 9.1, hemoglobin of 12.9, and low platelet count of 125,000 down from 152,000. Her CMP is fairly normal other than an elevated glucose of 239 and creatinine of 1.09 although improved from 1.26. She informed me that she had an injection for bursitis and was told that this would elevate her glucose levels. She has also not taken her morning medications. Her serum ammonia level is pending. I will see her back in 3 months with CBC, CMP, and serum ammonia level.   She denies fever, chills, night sweats, or other signs of infection. She denies cardiorespiratory and gastrointestinal issues. She  denies pain. Her appetite is good and increases at night. Her weight has increased 4 pounds over last 3 months.      I have reviewed the past medical history, past surgical history, social history and family history with the patient and they are unchanged from previous note.  ALLERGIES:  has no known allergies.  MEDICATIONS:  Current Outpatient Medications  Medication Sig Dispense Refill   furosemide  (LASIX ) 40 MG tablet Take 40 mg by mouth daily.     insulin  aspart (NOVOLOG ) 100 UNIT/ML FlexPen Inject 30 Units into the skin 2 (two) times daily.     lactulose  (CHRONULAC ) 10 GM/15ML solution TAKE 30 MLS (20 G TOTAL) BY MOUTH 3 (THREE) TIMES DAILY. 2838  mL 5   ondansetron  (ZOFRAN ) 4 MG tablet Take 1 tablet (4 mg total) by mouth every 4 (four) hours as needed for nausea. 60 tablet 3   OZEMPIC, 1 MG/DOSE, 4 MG/3ML SOPN Inject 1 mg into the skin once a week.     potassium chloride  SA (KLOR-CON  M) 20 MEQ tablet Take 1 tablet (20 mEq total) by mouth 2 (two) times daily. After 1 week, change  to 1 pill by mouth once daily (Patient taking differently: Take 20 mEq by mouth daily. After 1 week, change to 1 pill by mouth once daily) 40 tablet 5   pregabalin  (LYRICA ) 75 MG capsule Take 1 capsule (75 mg total) by mouth daily. At bedtime 30 capsule 5   rOPINIRole  (REQUIP ) 2 MG tablet Take 2 mg by mouth at bedtime.  3   spironolactone  (ALDACTONE ) 50 MG tablet Take 50 mg by mouth 2 (two) times daily.     TOUJEO  MAX SOLOSTAR 300 UNIT/ML Solostar Pen Inject 40 Units into the skin daily.     Vitamin D , Ergocalciferol , (DRISDOL) 1.25 MG (50000 UT) CAPS capsule Take 50,000 Units by mouth once a week.     XIFAXAN 550 MG TABS tablet Take 550 mg by mouth 2 (two) times daily.     No current facility-administered medications for this visit.   REVIEW OF SYSTEMS:  Review of Systems  Constitutional:  Positive for fatigue. Negative for appetite change, chills, diaphoresis, fever and unexpected weight change.  HENT:  Negative.  Negative for hearing loss, lump/mass, mouth sores, nosebleeds, sore throat, tinnitus, trouble swallowing and voice change.   Eyes: Negative.  Negative for eye problems and icterus.  Respiratory: Negative.  Negative for chest tightness, cough, hemoptysis, shortness of breath and wheezing.   Cardiovascular: Negative.  Negative for chest pain, leg swelling and palpitations.  Gastrointestinal:  Negative for abdominal distention, abdominal pain, blood in stool, constipation, diarrhea, nausea, rectal pain and vomiting.  Endocrine: Negative.   Genitourinary:  Negative for bladder incontinence, difficulty urinating, dyspareunia, dysuria, frequency,  hematuria, menstrual problem, nocturia, pelvic pain, vaginal bleeding and vaginal discharge.   Musculoskeletal:  Negative for arthralgias, back pain, flank pain, gait problem, myalgias, neck pain and neck stiffness.  Skin: Negative.  Negative for itching, rash and wound.  Neurological:  Negative for dizziness, extremity weakness, gait problem, headaches, light-headedness, numbness, seizures and speech difficulty.  Hematological: Negative.  Negative for adenopathy. Does not bruise/bleed easily.  Psychiatric/Behavioral:  Positive for sleep disturbance. Negative for confusion, decreased concentration, depression and suicidal ideas. The patient is not nervous/anxious.    VITALS:  Blood pressure (!) 180/88, pulse 75, temperature 98.1 F (36.7 C), temperature source Oral, resp. rate 18, height 5' 3 (1.6 m), weight 176 lb (79.8 kg), SpO2 99%.  Wt Readings from Last 3 Encounters:  08/14/24 176 lb (79.8 kg)  05/14/24 172 lb (78 kg)  02/08/24 168 lb 6.4 oz (76.4 kg)    Body mass index is 31.18 kg/m.  Performance status (ECOG): 1 - Symptomatic but completely ambulatory  PHYSICAL EXAM:  Physical Exam Vitals and nursing note reviewed.  Constitutional:      General: She is not in acute distress.    Appearance: Normal appearance. She is normal weight. She is not ill-appearing, toxic-appearing or diaphoretic.  HENT:     Head: Normocephalic and atraumatic.     Right Ear: Tympanic membrane, ear canal and external ear normal. There is no impacted cerumen.     Left Ear: Tympanic membrane, ear canal and external ear normal. There is no impacted cerumen.     Nose: Nose normal. No congestion or rhinorrhea.     Mouth/Throat:     Mouth: Mucous membranes are moist.     Pharynx: Oropharynx is clear. No oropharyngeal exudate or posterior oropharyngeal erythema.  Eyes:     General: No scleral icterus.       Right eye: No discharge.  Left eye: No discharge.     Extraocular Movements: Extraocular  movements intact.     Conjunctiva/sclera: Conjunctivae normal.     Pupils: Pupils are equal, round, and reactive to light.  Neck:     Vascular: No carotid bruit.     Comments: She has a small lipoma of the left posterior neck measuring about 2cm in diameter, soft and non-tender.  Cardiovascular:     Rate and Rhythm: Normal rate and regular rhythm.     Pulses: Normal pulses.     Heart sounds: Murmur heard.     Systolic murmur is present with a grade of 2/6.     No friction rub. No gallop.  Pulmonary:     Effort: Pulmonary effort is normal. No respiratory distress.     Breath sounds: Normal breath sounds. No stridor. No wheezing, rhonchi or rales.  Chest:     Chest wall: No tenderness.  Abdominal:     General: Bowel sounds are normal. There is no distension.     Palpations: Abdomen is soft. There is no hepatomegaly, splenomegaly or mass.     Tenderness: There is no abdominal tenderness. There is no right CVA tenderness, left CVA tenderness, guarding or rebound.     Hernia: No hernia is present.     Comments: Minimal to no ascites   Musculoskeletal:        General: No swelling, tenderness, deformity or signs of injury. Normal range of motion.     Cervical back: Normal range of motion and neck supple. No rigidity or tenderness.     Right lower leg: No edema.     Left lower leg: No edema.  Lymphadenopathy:     Cervical: No cervical adenopathy.     Right cervical: No superficial, deep or posterior cervical adenopathy.    Left cervical: No superficial, deep or posterior cervical adenopathy.     Upper Body:     Right upper body: No supraclavicular, axillary or pectoral adenopathy.     Left upper body: No supraclavicular, axillary or pectoral adenopathy.  Skin:    General: Skin is warm and dry.     Coloration: Skin is not jaundiced or pale.     Findings: No bruising, erythema, lesion or rash.  Neurological:     General: No focal deficit present.     Mental Status: She is alert and  oriented to person, place, and time. Mental status is at baseline.     Cranial Nerves: No cranial nerve deficit.     Sensory: No sensory deficit.     Motor: No weakness.     Coordination: Coordination normal.     Gait: Gait normal.     Deep Tendon Reflexes: Reflexes normal.  Psychiatric:        Mood and Affect: Mood normal.        Behavior: Behavior normal.        Thought Content: Thought content normal.        Judgment: Judgment normal.    LABORATORY DATA:  I have reviewed the data as listed    Latest Ref Rng & Units 08/14/2024    8:56 AM 05/14/2024   10:33 AM 02/08/2024    1:02 PM  CBC  WBC 4.0 - 10.5 K/uL 9.1  7.3  7.4   Hemoglobin 12.0 - 15.0 g/dL 87.0  86.6  86.6   Hematocrit 36.0 - 46.0 % 38.3  40.2  39.8   Platelets 150 - 400 K/uL 125  152  159  Component Value Date/Time   NA 141 08/14/2024 0856   NA 138 04/06/2023 0000   K 4.0 08/14/2024 0856   CL 105 08/14/2024 0856   CO2 25 08/14/2024 0856   GLUCOSE 239 (H) 08/14/2024 0856   BUN 20 08/14/2024 0856   BUN 13 04/06/2023 0000   CREATININE 1.09 (H) 08/14/2024 0856   CALCIUM 9.8 08/14/2024 0856   PROT 6.6 08/14/2024 0856   ALBUMIN 3.8 08/14/2024 0856   AST 28 08/14/2024 0856   ALT 20 08/14/2024 0856   ALKPHOS 62 08/14/2024 0856   BILITOT 1.2 08/14/2024 0856   GFRNONAA 53 (L) 08/14/2024 0856   GFRAA NOT CALCULATED 04/18/2011 0607  No results found for: SPEP, UPEP   RADIOGRAPHIC STUDIES:  Exam: 03/14/2024 DEXA Impression: Left Femoral Neck: T-score of -3.7 Left Total Hip: T-score of -2.9 Right Femoral Neck: T-score of -3.9 Right Total Hip: T-score of -2.2.  Left 1/3 radius: T-score of -4.      I,Jasmine M Lassiter,acting as a scribe for Destiny VEAR Cornish, MD.,have documented all relevant documentation on the behalf of Destiny VEAR Cornish, MD,as directed by  Destiny VEAR Cornish, MD while in the presence of Destiny VEAR Cornish, MD.   "

## 2024-08-20 ENCOUNTER — Telehealth: Payer: Self-pay

## 2024-08-20 NOTE — Telephone Encounter (Signed)
Called patient and notified of message.  °

## 2024-08-20 NOTE — Telephone Encounter (Signed)
-----   Message from Wanda Cornish, MD sent at 08/20/2024 12:59 PM EST ----- Regarding: call Tell her ammonia level is staying good

## 2024-08-21 ENCOUNTER — Encounter: Payer: Self-pay | Admitting: Oncology

## 2024-11-13 ENCOUNTER — Inpatient Hospital Stay

## 2024-11-13 ENCOUNTER — Inpatient Hospital Stay: Admitting: Oncology
